# Patient Record
Sex: Male | Born: 1970 | Race: Black or African American | Hispanic: No | Marital: Single | Smoking: Former smoker
Health system: Southern US, Community
[De-identification: ages and names within clinical notes are randomized; demographics above are authoritative.]

## PROBLEM LIST (undated history)

## (undated) DIAGNOSIS — I10 Essential (primary) hypertension: Secondary | ICD-10-CM

## (undated) DIAGNOSIS — E119 Type 2 diabetes mellitus without complications: Secondary | ICD-10-CM

## (undated) DIAGNOSIS — I509 Heart failure, unspecified: Secondary | ICD-10-CM

## (undated) HISTORY — PX: FOOT SURGERY: SHX648

## (undated) HISTORY — DX: Essential (primary) hypertension: I10

---

## 2017-04-24 ENCOUNTER — Encounter (HOSPITAL_COMMUNITY): Payer: Self-pay

## 2017-04-24 ENCOUNTER — Emergency Department (HOSPITAL_COMMUNITY)
Admission: EM | Admit: 2017-04-24 | Discharge: 2017-04-24 | Disposition: A | Discharge status: Home / Self Care | Payer: Self-pay | Attending: Emergency Medicine | Admitting: Emergency Medicine

## 2017-04-24 DIAGNOSIS — E119 Type 2 diabetes mellitus without complications: Secondary | ICD-10-CM | POA: Insufficient documentation

## 2017-04-24 DIAGNOSIS — R739 Hyperglycemia, unspecified: Secondary | ICD-10-CM

## 2017-04-24 DIAGNOSIS — Z87891 Personal history of nicotine dependence: Secondary | ICD-10-CM | POA: Insufficient documentation

## 2017-04-24 HISTORY — DX: Type 2 diabetes mellitus without complications: E11.9

## 2017-04-24 LAB — CBG MONITORING, ED: Glucose-Capillary: 205 mg/dL — ABNORMAL HIGH (ref 65–99)

## 2017-04-24 NOTE — ED Provider Notes (Signed)
[facility] DEPT Provider Note   CSN: [id number] Arrival date & time: 04/24/17  1854     History   Chief Complaint Chief Complaint  Patient presents with  . Hyperglycemia    HPI Gary Bentley is a 46 y.o. male with history of insulin dependent Type 2 Diabetes who presents today for high blood glucose. The patient notes he last took his insulin at 3am this morning but is out of his medication now. He has a script for it but hasn't picked it up. His blood sugar was 205 on arrival. Not sure last A1c. Denies fever, chills, dizziness, unexpected weight loss, visual changes, syncope, chest pain, palpatations, sob, doe, abdominal pain, n/v/d, hematochezia, melena, leg swelling. The patient notes bleeding on his sock from the bottom of his foot 2 days ago. None today. History of surgery on foot from prior infection per patient. No numbness, tingling, weakness, decreased rom.   HPI  Past Medical History:  Diagnosis Date  . Diabetes mellitus without complication ([place])     There are no active problems to display for this patient.   Past Surgical History:  Procedure Laterality Date  . FOOT SURGERY         Home Medications    Prior to Admission medications   Not on File    Family History History reviewed. No pertinent family history.  Social History Social History  Substance Use Topics  . Smoking status: Former Smoker    Packs/day: 1.00    Types: Cigarettes    Quit date: 04/17/2017  . Smokeless tobacco: Never Used  . Alcohol use No     Allergies   Patient has no known allergies.   Review of Systems Review of Systems  All other systems reviewed and are negative.    Physical Exam Updated Vital Signs BP (!) 142/98 (BP Location: Right Arm)   Pulse 87   Temp 97.9 F (36.6 C) (Oral)   Resp 18   Ht 6\' 5"  (1.956 m)   Wt 108.9 kg (240 lb)   SpO2 99%   BMI 28.46 kg/m   Physical Exam  Constitutional: He appears well-developed and well-nourished.  HENT:    Head: Normocephalic and atraumatic.  Mouth/Throat: Oropharynx is clear and moist.  Eyes: Pupils are equal, round, and reactive to light. Right conjunctiva is injected. Left conjunctiva is injected.  Neck: Neck supple.  Cardiovascular: Normal rate, regular rhythm and intact distal pulses.   No murmur heard. Pulmonary/Chest: Effort normal and breath sounds normal. He exhibits no tenderness.  Abdominal: Soft. Bowel sounds are normal. There is no tenderness. There is no rebound and no guarding.  Musculoskeletal: He exhibits no edema.       Right ankle: He exhibits normal range of motion. No tenderness. Achilles tendon normal.       Left ankle: Normal. He exhibits normal range of motion. No tenderness. Achilles tendon normal.       Right foot: There is normal range of motion, no tenderness, no bony tenderness, no swelling and normal capillary refill.       Left foot: There is normal range of motion, no tenderness, no bony tenderness, no swelling and normal capillary refill.       Feet:  Proprioception to big toe intact. Onchomycosis present on big toe greatest on left big toe.   Lymphadenopathy:    He has no cervical adenopathy.  Neurological: He is alert. He has normal strength. No sensory deficit.  Skin: No rash noted. He is not diaphoretic.  Psychiatric: He has a normal mood and affect.  Nursing note and vitals reviewed.    ED Treatments / Results  Labs (all labs ordered are listed, but only abnormal results are displayed) Labs Reviewed  CBG MONITORING, ED - Abnormal; Notable for the following:       Result Value   Glucose-Capillary 205 (*)    All other components within normal limits    EKG  EKG Interpretation None       Radiology No results found.  Procedures Procedures (including critical care time)  Medications Ordered in ED Medications - No data to display   Initial Impression / Assessment and Plan / ED Course  I have reviewed the triage vital signs and the  nursing notes.  Pertinent labs & imaging results that were available during my care of the patient were reviewed by me and considered in my medical decision making (see chart for details).     This is a 46 y.o. male with history of insulin dependent Type 2 Diabetes who presents today for a increased blood glucose of 205 after running out of insulin at home. Patient has prescription to refill in the morning.    The patient notes bleeding on his sock from the bottom of his foot 2 days ago. None today. Says wounds are chronic. No numbness, tingling, weakness, decreased rom. Afebrile. Exam shows no evidence of bleeding ulceration.   Referall made to podiatry. Patient in agreeance with plan. Return precautions given. Patient can return to the emergency department at anytime if symptoms worsen.    Final Clinical Impressions(s) / ED Diagnoses   Final diagnoses:  Hyperglycemia    New Prescriptions New Prescriptions   No medications on file     Lorelle Gibbs 04/24/17 2347    Varney Biles, MD 04/25/17 1759

## 2017-04-24 NOTE — ED Notes (Signed)
Pt given urinal.

## 2017-04-24 NOTE — Discharge Instructions (Signed)
Please refill your insulin tomorrow and take as prescribed. I have provided you with information to follow up with a podiatrist regarding your feet. Please schedule an appointment. You can return to the emergency department at any time for worsening symptoms.

## 2017-04-24 NOTE — ED Notes (Signed)
Denies fever, chills, nausea, vomiting, or diarrhea.

## 2017-04-24 NOTE — ED Triage Notes (Signed)
Pt reports "i feel like my blood sugar is high and I'm peeing alot" CBG 205 here. Pt also endorses left foot pain, pt had infection in left foot "months ago" and reports it began bleeding 2 days ago. Pt has a callus to left foot, no bleeding noted by this RN. VSS.

## 2017-09-03 ENCOUNTER — Inpatient Hospital Stay
Admission: EM | Admit: 2017-09-03 | Discharge: 2017-09-05 | DRG: 638 | Disposition: A | Discharge status: Home / Self Care | Payer: Self-pay | Attending: Internal Medicine | Admitting: Internal Medicine

## 2017-09-03 ENCOUNTER — Emergency Department: Payer: Self-pay

## 2017-09-03 DIAGNOSIS — F17200 Nicotine dependence, unspecified, uncomplicated: Secondary | ICD-10-CM | POA: Diagnosis present

## 2017-09-03 DIAGNOSIS — L97521 Non-pressure chronic ulcer of other part of left foot limited to breakdown of skin: Secondary | ICD-10-CM

## 2017-09-03 DIAGNOSIS — M79671 Pain in right foot: Secondary | ICD-10-CM

## 2017-09-03 DIAGNOSIS — E11621 Type 2 diabetes mellitus with foot ulcer: Secondary | ICD-10-CM | POA: Diagnosis present

## 2017-09-03 DIAGNOSIS — Z794 Long term (current) use of insulin: Secondary | ICD-10-CM

## 2017-09-03 DIAGNOSIS — E1169 Type 2 diabetes mellitus with other specified complication: Principal | ICD-10-CM | POA: Diagnosis present

## 2017-09-03 DIAGNOSIS — E08621 Diabetes mellitus due to underlying condition with foot ulcer: Secondary | ICD-10-CM

## 2017-09-03 DIAGNOSIS — R350 Frequency of micturition: Secondary | ICD-10-CM | POA: Diagnosis present

## 2017-09-03 DIAGNOSIS — T148XXA Other injury of unspecified body region, initial encounter: Secondary | ICD-10-CM

## 2017-09-03 DIAGNOSIS — Z91128 Patient's intentional underdosing of medication regimen for other reason: Secondary | ICD-10-CM

## 2017-09-03 DIAGNOSIS — M869 Osteomyelitis, unspecified: Secondary | ICD-10-CM | POA: Diagnosis present

## 2017-09-03 DIAGNOSIS — L97519 Non-pressure chronic ulcer of other part of right foot with unspecified severity: Secondary | ICD-10-CM | POA: Diagnosis present

## 2017-09-03 DIAGNOSIS — E1165 Type 2 diabetes mellitus with hyperglycemia: Secondary | ICD-10-CM | POA: Diagnosis present

## 2017-09-03 DIAGNOSIS — R739 Hyperglycemia, unspecified: Secondary | ICD-10-CM

## 2017-09-03 DIAGNOSIS — L97529 Non-pressure chronic ulcer of other part of left foot with unspecified severity: Secondary | ICD-10-CM | POA: Diagnosis present

## 2017-09-03 DIAGNOSIS — M79672 Pain in left foot: Secondary | ICD-10-CM

## 2017-09-03 DIAGNOSIS — E1140 Type 2 diabetes mellitus with diabetic neuropathy, unspecified: Secondary | ICD-10-CM | POA: Diagnosis present

## 2017-09-03 DIAGNOSIS — L089 Local infection of the skin and subcutaneous tissue, unspecified: Secondary | ICD-10-CM

## 2017-09-03 LAB — CBC
HCT: 42.4 % (ref 40.0–52.0)
Hemoglobin: 14.3 g/dL (ref 13.0–18.0)
MCH: 33.8 pg (ref 26.0–34.0)
MCHC: 33.7 g/dL (ref 32.0–36.0)
MCV: 100.2 fL — ABNORMAL HIGH (ref 80.0–100.0)
PLATELETS: 254 10*3/uL (ref 150–440)
RBC: 4.23 MIL/uL — ABNORMAL LOW (ref 4.40–5.90)
RDW: 12.5 % (ref 11.5–14.5)
WBC: 7.3 10*3/uL (ref 3.8–10.6)

## 2017-09-03 LAB — BASIC METABOLIC PANEL
Anion gap: 12 (ref 5–15)
BUN: 12 mg/dL (ref 6–20)
CALCIUM: 9.2 mg/dL (ref 8.9–10.3)
CO2: 22 mmol/L (ref 22–32)
Chloride: 95 mmol/L — ABNORMAL LOW (ref 101–111)
Creatinine, Ser: 1.1 mg/dL (ref 0.61–1.24)
GFR calc Af Amer: >60 mL/min (ref >60)
GLUCOSE: 699 mg/dL — AB (ref 65–99)
Potassium: 3.9 mmol/L (ref 3.5–5.1)
Sodium: 129 mmol/L — ABNORMAL LOW (ref 135–145)

## 2017-09-03 LAB — URINALYSIS, COMPLETE (UACMP) WITH MICROSCOPIC
BILIRUBIN URINE: NEGATIVE
Glucose, UA: >=500 mg/dL — AB
Hgb urine dipstick: NEGATIVE
Ketones, ur: NEGATIVE mg/dL
Leukocytes, UA: NEGATIVE
NITRITE: NEGATIVE
PH: 6 (ref 5.0–8.0)
Protein, ur: NEGATIVE mg/dL
SPECIFIC GRAVITY, URINE: 1.03 (ref 1.005–1.030)
Squamous Epithelial / LPF: NONE SEEN

## 2017-09-03 LAB — GLUCOSE, CAPILLARY
GLUCOSE-CAPILLARY: 439 mg/dL — AB (ref 65–99)
GLUCOSE-CAPILLARY: 452 mg/dL — AB (ref 65–99)
GLUCOSE-CAPILLARY: 555 mg/dL — AB (ref 65–99)
Glucose-Capillary: >600 mg/dL (ref 65–99)
Glucose-Capillary: 540 mg/dL (ref 65–99)

## 2017-09-03 MED ORDER — SODIUM CHLORIDE 0.9 % IV BOLUS (SEPSIS)
1000.0000 mL | Freq: Once | INTRAVENOUS | Status: AC
  Administered 2017-09-03: 1000 mL via INTRAVENOUS

## 2017-09-03 MED ORDER — INSULIN LISPRO 100 UNIT/ML ~~LOC~~ SOLN: SUBCUTANEOUS | 0 refills | Status: DC

## 2017-09-03 MED ORDER — METFORMIN HCL 500 MG PO TABS: 500.0000 mg | ORAL_TABLET | Freq: Two times a day (BID) | ORAL | 0 refills | Status: DC

## 2017-09-03 MED ORDER — CEFTRIAXONE SODIUM IN DEXTROSE 20 MG/ML IV SOLN
1.0000 g | Freq: Once | INTRAVENOUS | Status: AC
  Administered 2017-09-04: 1 g via INTRAVENOUS
  Filled 2017-09-03: qty 50

## 2017-09-03 MED ORDER — SULFAMETHOXAZOLE-TRIMETHOPRIM 800-160 MG PO TABS: 1.0000 | ORAL_TABLET | Freq: Two times a day (BID) | ORAL | 0 refills | Status: DC

## 2017-09-03 MED ORDER — INSULIN ASPART 100 UNIT/ML ~~LOC~~ SOLN
10.0000 [IU] | Freq: Once | SUBCUTANEOUS | Status: AC
  Administered 2017-09-04: 10 [IU] via INTRAVENOUS
  Filled 2017-09-03: qty 1

## 2017-09-03 NOTE — ED Notes (Signed)
Lab called with critical glucose of 699.

## 2017-09-03 NOTE — Discharge Instructions (Signed)
1. Restart your diabetes medicines: Metformin 500 mg twice daily Humalog 30 units twice daily 2. Take antibiotic as prescribed (Septra DS twice daily 7 days). 3. Return to the ER for worsening symptoms, persistent vomiting, difficulty breathing, fever or other concerns.

## 2017-09-03 NOTE — ED Triage Notes (Signed)
Pt states he just moved here from [place], hx DM. Hasn't checked CBG in a month. States hasn't had insulin in 1 month either. States increased thirst and urination. Pt is alert, oriented, ambulatory.

## 2017-09-03 NOTE — ED Provider Notes (Signed)
[facility] Emergency Department Provider Note   ____________________________________________   First MD Initiated Contact with Patient 09/03/17 2336     (approximate)  I have reviewed the triage vital signs and the nursing notes.   HISTORY  Chief Complaint Hyperglycemia    HPI Gary Bentley is a 46 y.o. male who presents to the ED from home with a chief complaint of increased thirst and urination. Patient is a diabetic who moved here from [place] approximately 1 month ago. Has been without his metformin and Humalog since that time and has not checked his blood sugars. States baseline blood sugars in the 140 range. Denies associated fever, chills, vision changes, chest pain, shortness of breath, abdominal pain, nausea, vomiting. Denies recent trauma. Nothing makes his symptoms better or worse.   Past Medical History:  Diagnosis Date  . Diabetes mellitus without complication ([place])     There are no active problems to display for this patient.   Past Surgical History:  Procedure Laterality Date  . FOOT SURGERY      Prior to Admission medications   Medication Sig Start Date End Date Taking? Authorizing Provider  Insulin Detemir (LEVEMIR) 100 UNIT/ML Pen Inject 40 Units into the skin at bedtime. 05/14/17   [provider]    Allergies Patient has no known allergies.  History reviewed. No pertinent family history.  Social History Social History  Substance Use Topics  . Smoking status: Former Smoker    Packs/day: 1.00    Types: Cigarettes    Quit date: 04/17/2017  . Smokeless tobacco: Never Used  . Alcohol use No    Review of Systems  Constitutional: positive for increased thirst. No fever/chills. Eyes: No visual changes. ENT: No sore throat. Cardiovascular: Denies chest pain. Respiratory: Denies shortness of breath. Gastrointestinal: No abdominal pain.  No nausea, no vomiting.  No diarrhea.  No constipation. Genitourinary:  positive for urinary frequency.Negative for dysuria. Musculoskeletal: Negative for back pain. Skin: Negative for rash. Neurological: Negative for headaches, focal weakness or numbness.   ____________________________________________   PHYSICAL EXAM:  VITAL SIGNS: ED Triage Vitals  Enc Vitals Group     BP 09/03/17 2033 123/80     Pulse Rate 09/03/17 2033 98     Resp 09/03/17 2033 18     Temp 09/03/17 2033 98.6 F (37 C)     Temp Source 09/03/17 2033 Oral     SpO2 09/03/17 2033 98 %     Weight 09/03/17 2034 230 lb (104.3 kg)     Height 09/03/17 2034 6\' 6"  (1.981 m)     Head Circumference --      Peak Flow --      Pain Score 09/03/17 2033 7     Pain Loc --      Pain Edu? --      Excl. in [place]? --     Constitutional: Alert and oriented. Well appearing and in no acute distress. Eyes: Conjunctivae are normal. PERRL. EOMI. Head: Atraumatic. Nose: No congestion/rhinnorhea. Mouth/Throat: Mucous membranes are moist.  Oropharynx non-erythematous. Neck: No stridor.  No carotid bruits. Cardiovascular: Normal rate, regular rhythm. Grossly normal heart sounds.  Good peripheral circulation. Respiratory: Normal respiratory effort.  No retractions. Lungs CTAB. Gastrointestinal: Soft and nontender. No distention. No abdominal bruits. No CVA tenderness. Musculoskeletal:  Right foot: Dried ulcers to ball of foot and lateral sole. Left foot: Weeping ulcer to ball of foot without associated warmth or erythema. Neurologic:  Normal speech and language. No gross focal  neurologic deficits are appreciated. No gait instability. Skin:  Skin is warm, dry and intact. No rash noted. Psychiatric: Mood and affect are normal. Speech and behavior are normal.  ____________________________________________   LABS (all labs ordered are listed, but only abnormal results are displayed)  Labs Reviewed  BASIC METABOLIC PANEL - Abnormal; Notable for the following:       Result Value   Sodium 129 (*)     Chloride 95 (*)    Glucose, Bld 699 (*)    All other components within normal limits  CBC - Abnormal; Notable for the following:    RBC 4.23 (*)    MCV 100.2 (*)    All other components within normal limits  URINALYSIS, COMPLETE (UACMP) WITH MICROSCOPIC - Abnormal; Notable for the following:    Color, Urine COLORLESS (*)    APPearance CLEAR (*)    Glucose, UA >=500 (*)    Bacteria, UA RARE (*)    All other components within normal limits  GLUCOSE, CAPILLARY - Abnormal; Notable for the following:    Glucose-Capillary >600 (*)    All other components within normal limits  GLUCOSE, CAPILLARY - Abnormal; Notable for the following:    Glucose-Capillary 555 (*)    All other components within normal limits  GLUCOSE, CAPILLARY - Abnormal; Notable for the following:    Glucose-Capillary 540 (*)    All other components within normal limits  GLUCOSE, CAPILLARY - Abnormal; Notable for the following:    Glucose-Capillary 452 (*)    All other components within normal limits  CBG MONITORING, ED   ____________________________________________  EKG  None ____________________________________________  RADIOLOGY  No results found.  ____________________________________________   PROCEDURES  Procedure(s) performed: None  Procedures  Critical Care performed: No  ____________________________________________   INITIAL IMPRESSION / ASSESSMENT AND PLAN / ED COURSE  As part of my medical decision making, I reviewed the following data within the [place] notes reviewed and incorporated, Labs reviewed, and Radiograph reviewed.   46 year old diabetic male who presents with increased thirst, urination and left foot ulcer. Differential diagnosis includes but is not limited to hyperglycemia, DKA, infection, osteomyelitis, cellulitis. After 2 L of IV fluids, blood sugar has decreased to 439. Will add insulin, x-ray foot to evaluate osteomyelitis, initiate antibiotic  and reassess.  Clinical Course as of Sep 05 355  Sat Sep 04, 2017  0120 Patient asleep in no acute distress. Updated him of the x-ray results concerning for osteomyelitis. Will add IV vancomycin. Discussed with hospitalist evaluate patient in the emergency department for admission.  [JS]    Clinical Course User Index [JS] Paulette Blanch, MD     ____________________________________________   FINAL CLINICAL IMPRESSION(S) / ED DIAGNOSES  Final diagnoses:  Hyperglycemia  Diabetic ulcer of toe of left foot associated with type 2 diabetes mellitus, limited to breakdown of skin ([place])      NEW MEDICATIONS STARTED DURING THIS VISIT:  New Prescriptions   No medications on file     Note:  This document was prepared using Dragon voice recognition software and may include unintentional dictation errors.    Paulette Blanch, MD 09/04/17 [phone]

## 2017-09-04 ENCOUNTER — Encounter: Payer: Self-pay | Admitting: Internal Medicine

## 2017-09-04 ENCOUNTER — Inpatient Hospital Stay: Payer: Self-pay

## 2017-09-04 DIAGNOSIS — M79672 Pain in left foot: Secondary | ICD-10-CM

## 2017-09-04 DIAGNOSIS — R739 Hyperglycemia, unspecified: Secondary | ICD-10-CM

## 2017-09-04 DIAGNOSIS — E08621 Diabetes mellitus due to underlying condition with foot ulcer: Secondary | ICD-10-CM

## 2017-09-04 DIAGNOSIS — M79671 Pain in right foot: Secondary | ICD-10-CM

## 2017-09-04 DIAGNOSIS — M869 Osteomyelitis, unspecified: Secondary | ICD-10-CM

## 2017-09-04 LAB — CREATININE, SERUM: CREATININE: 0.77 mg/dL (ref 0.61–1.24)

## 2017-09-04 LAB — GLUCOSE, CAPILLARY
GLUCOSE-CAPILLARY: 335 mg/dL — AB (ref 65–99)
GLUCOSE-CAPILLARY: 299 mg/dL — AB (ref 65–99)
GLUCOSE-CAPILLARY: 254 mg/dL — AB (ref 65–99)
Glucose-Capillary: 406 mg/dL — ABNORMAL HIGH (ref 65–99)
Glucose-Capillary: 410 mg/dL — ABNORMAL HIGH (ref 65–99)
Glucose-Capillary: 283 mg/dL — ABNORMAL HIGH (ref 65–99)

## 2017-09-04 LAB — CBC
HCT: 41.7 % (ref 40.0–52.0)
Hemoglobin: 14 g/dL (ref 13.0–18.0)
MCH: 33.6 pg (ref 26.0–34.0)
MCHC: 33.6 g/dL (ref 32.0–36.0)
MCV: 99.9 fL (ref 80.0–100.0)
Platelets: 241 10*3/uL (ref 150–440)
RBC: 4.17 MIL/uL — ABNORMAL LOW (ref 4.40–5.90)
RDW: 12.7 % (ref 11.5–14.5)
WBC: 6 10*3/uL (ref 3.8–10.6)

## 2017-09-04 LAB — HEMOGLOBIN A1C
Hgb A1c MFr Bld: 15 % — ABNORMAL HIGH (ref 4.8–5.6)
Mean Plasma Glucose: 383.8 mg/dL

## 2017-09-04 MED ORDER — SODIUM CHLORIDE 0.9 % IV SOLN
INTRAVENOUS | Status: DC
  Administered 2017-09-04 – 2017-09-05 (×2): via INTRAVENOUS

## 2017-09-04 MED ORDER — ONDANSETRON HCL 4 MG/2ML IJ SOLN: 4.0000 mg | Freq: Four times a day (QID) | INTRAMUSCULAR | Status: DC | PRN

## 2017-09-04 MED ORDER — BISACODYL 10 MG RE SUPP
10.0000 mg | Freq: Every day | RECTAL | Status: DC | PRN
  Filled 2017-09-04: qty 1

## 2017-09-04 MED ORDER — PANTOPRAZOLE SODIUM 40 MG IV SOLR
40.0000 mg | Freq: Two times a day (BID) | INTRAVENOUS | Status: DC
  Administered 2017-09-04 – 2017-09-05 (×3): 40 mg via INTRAVENOUS
  Filled 2017-09-04 (×3): qty 40

## 2017-09-04 MED ORDER — INSULIN GLARGINE 100 UNIT/ML ~~LOC~~ SOLN
40.0000 [IU] | Freq: Every day | SUBCUTANEOUS | Status: DC
  Administered 2017-09-04: 40 [IU] via SUBCUTANEOUS
  Filled 2017-09-04 (×2): qty 0.4

## 2017-09-04 MED ORDER — ONDANSETRON HCL 4 MG PO TABS: 4.0000 mg | ORAL_TABLET | Freq: Four times a day (QID) | ORAL | Status: DC | PRN

## 2017-09-04 MED ORDER — ENOXAPARIN SODIUM 40 MG/0.4ML ~~LOC~~ SOLN
40.0000 mg | SUBCUTANEOUS | Status: DC
  Administered 2017-09-04: 40 mg via SUBCUTANEOUS
  Filled 2017-09-04: qty 0.4

## 2017-09-04 MED ORDER — ACETAMINOPHEN 650 MG RE SUPP: 650.0000 mg | Freq: Four times a day (QID) | RECTAL | Status: DC | PRN

## 2017-09-04 MED ORDER — ACETAMINOPHEN 325 MG PO TABS: 650.0000 mg | ORAL_TABLET | Freq: Four times a day (QID) | ORAL | Status: DC | PRN

## 2017-09-04 MED ORDER — INSULIN ASPART 100 UNIT/ML ~~LOC~~ SOLN
12.0000 [IU] | Freq: Once | SUBCUTANEOUS | Status: AC
  Administered 2017-09-04: 19:00:00 12 [IU] via SUBCUTANEOUS
  Filled 2017-09-04: qty 1

## 2017-09-04 MED ORDER — VANCOMYCIN HCL IN DEXTROSE 1-5 GM/200ML-% IV SOLN
1000.0000 mg | Freq: Once | INTRAVENOUS | Status: AC
  Administered 2017-09-04: 1000 mg via INTRAVENOUS
  Filled 2017-09-04: qty 200

## 2017-09-04 MED ORDER — SULFAMETHOXAZOLE-TRIMETHOPRIM 400-80 MG PO TABS
1.0000 | ORAL_TABLET | Freq: Two times a day (BID) | ORAL | Status: DC
  Administered 2017-09-04 (×2): 1 via ORAL
  Filled 2017-09-04 (×4): qty 1

## 2017-09-04 MED ORDER — INSULIN ASPART 100 UNIT/ML ~~LOC~~ SOLN
0.0000 [IU] | Freq: Three times a day (TID) | SUBCUTANEOUS | Status: DC
Start: 2017-09-04 — End: 2017-09-05

## 2017-09-04 MED ORDER — DOCUSATE SODIUM 100 MG PO CAPS
100.0000 mg | ORAL_CAPSULE | Freq: Two times a day (BID) | ORAL | Status: DC
  Administered 2017-09-04 – 2017-09-05 (×3): 100 mg via ORAL
  Filled 2017-09-04 (×3): qty 1

## 2017-09-04 MED ORDER — VANCOMYCIN HCL 10 G IV SOLR
1250.0000 mg | Freq: Three times a day (TID) | INTRAVENOUS | Status: DC
  Administered 2017-09-04 – 2017-09-05 (×3): 1250 mg via INTRAVENOUS
  Filled 2017-09-04 (×7): qty 1250

## 2017-09-04 MED ORDER — MORPHINE SULFATE (PF) 2 MG/ML IV SOLN: 2.0000 mg | INTRAVENOUS | Status: DC | PRN

## 2017-09-04 NOTE — ED Provider Notes (Signed)
-----------------------------------------   1:00 PM on 09/04/2017 -----------------------------------------  I personally seen and evaluated the patient. Patient has ulcerations bilateral feet. Patient's blood glucose has decreased to 299. Unfortunately the patient has had a proloned ER stay due to a miscommunication.  Patient remains pleasant, agreeable to admission for continued IV antibiotics for osteomyelitis. Patient is from [place], will be traveling back to [place] on Monday but states he is moving to this area permanently later this week. He has no primary care follow-up, he has no medications at home, states he has been out of his insulin for over one month. I believe admission to the hospital remains warranted for IV antibiotics for osteomyelitis continued glucose control and refill of home medications as well as getting plugged into the local resources. Patient is agreeable to this plan of care. The hospitalist will be admitting the patient at this time.    Harvest Dark, MD 09/04/17 1352

## 2017-09-04 NOTE — Progress Notes (Signed)
Pharmacy Antibiotic Note  Gary Bentley is a 46 y.o. male admitted on 09/03/2017 with osteomyelitis.  Pharmacy has been consulted for vancomycin dosing.  Plan: Vancomycin 1250mg  IV every 8 hours.  Goal trough 15-20 mcg/mL.  Height: 6\' 6"  (198.1 cm) Weight: 230 lb (104.3 kg) IBW/kg (Calculated) : 91.4  Temp (24hrs), Avg:98.4 F (36.9 C), Min:98.2 F (36.8 C), Max:98.6 F (37 C)   Recent Labs Lab 09/03/17 2036  WBC 7.3  CREATININE 1.10    Estimated Creatinine Clearance: 111.9 mL/min (by C-G formula based on SCr of 1.1 mg/dL).    No Known Allergies  Antimicrobials this admission: Anti-infectives    Start     Dose/Rate Route Frequency Ordered Stop   09/04/17 1500  vancomycin (VANCOCIN) 1,250 mg in sodium chloride 0.9 % 250 mL IVPB     1,250 mg 166.7 mL/hr over 90 Minutes Intravenous Every 8 hours 09/04/17 1407     09/04/17 1315  sulfamethoxazole-trimethoprim (BACTRIM,SEPTRA) 400-80 MG per tablet 1 tablet     1 tablet Oral Every 12 hours 09/04/17 1311     09/04/17 0130  vancomycin (VANCOCIN) IVPB 1000 mg/200 mL premix     1,000 mg 200 mL/hr over 60 Minutes Intravenous  Once 09/04/17 0120 09/04/17 0312   09/04/17 0000  cefTRIAXone (ROCEPHIN) 1 g in dextrose 5 % 50 mL IVPB - Premix     1 g 100 mL/hr over 30 Minutes Intravenous  Once 09/03/17 2345 09/04/17 0116   09/03/17 0000  sulfamethoxazole-trimethoprim (BACTRIM DS,SEPTRA DS) 800-160 MG tablet     1 tablet Oral 2 times daily 09/03/17 2348        Microbiology results: No results found for this or any previous visit (from the past 240 hour(s)).   Thank you for allowing pharmacy to be a part of this patient's care.  Donna Christen Gary Bentley 09/04/2017 2:08 PM

## 2017-09-04 NOTE — ED Notes (Addendum)
Pt. Reports not taking diabetic medication for the past month.  Pt. States sore on lt. Foot.  Pt. States sore has been on foot for "a while".  Pt. States it just started weeping this past week.

## 2017-09-04 NOTE — ED Notes (Signed)
Pt sleeping. vss wnl.

## 2017-09-04 NOTE — Progress Notes (Signed)
New admission. FSBS 405- 410 with MD notified with new orders obtained. Similar wounds on right foot with xray ordered. Pt states he has to leave tomorrow because he has to go back to [place] by Tuesday. Began teaching with pt stating he would not be staying and will be leaving tomorrow.

## 2017-09-04 NOTE — H&P (Signed)
History and Physical    Gary Bentley [id number] DOB: 11/02/1972 DOA: 09/03/2017  Referring physician: Dr. Beather Arbour PCP: Patient, No Pcp Per  Specialists: none  Chief Complaint: foot pain and increased thirst  HPI: Gary Bentley is a 46 y.o. male has a past medical history significant for DM and non-compliance who presents to ER with increased thirst and urinary frequency. Also with foot pain. Found to have blood sugars >400 and multiple foot ulcerations with x-rays revealing osteomyelitis. He is now admitted. No fever. Denies CP or SOB. No N/V/D.  Review of Systems: The patient denies anorexia, fever, weight loss,, vision loss, decreased hearing, hoarseness, chest pain, syncope, dyspnea on exertion, peripheral edema, balance deficits, hemoptysis, abdominal pain, melena, hematochezia, severe indigestion/heartburn, hematuria, incontinence, genital sores, muscle weakness, suspicious skin lesions, transient blindness, difficulty walking, depression, unusual weight change, abnormal bleeding, enlarged lymph nodes, angioedema, and breast masses.   Past Medical History:  Diagnosis Date  . Diabetes mellitus without complication [facility])    Past Surgical History:  Procedure Laterality Date  . FOOT SURGERY     Social History:  reports that he quit smoking about 4 months ago. His smoking use included Cigarettes. He smoked 1.00 pack per day. He has never used smokeless tobacco. He reports that he does not drink alcohol or use drugs.  No Known Allergies  History reviewed. No pertinent family history.  Prior to Admission medications   Medication Sig Start Date End Date Taking? Authorizing Provider  Insulin Detemir (LEVEMIR) 100 UNIT/ML Pen Inject 40 Units into the skin at bedtime. 05/14/17   [provider]  insulin lispro (HUMALOG) 100 UNIT/ML injection 30 units bid 09/03/17 09/03/18  Paulette Blanch, MD  metFORMIN (GLUCOPHAGE) 500 MG tablet Take 1 tablet (500 mg total) by mouth 2 (two)  times daily with a meal. 09/03/17   Paulette Blanch, MD  sulfamethoxazole-trimethoprim (BACTRIM DS,SEPTRA DS) 800-160 MG tablet Take 1 tablet by mouth 2 (two) times daily. 09/03/17   Paulette Blanch, MD   Physical Exam: Vitals:   09/04/17 0830 09/04/17 0930 09/04/17 1026 09/04/17 1151  BP: 126/80 135/76 118/72 134/79  Pulse: 60 66 66 (!) 58  Resp:    16  Temp:    98.2 F (36.8 C)  TempSrc:    Oral  SpO2: 93% 94% 97% 99%  Weight:      Height:         General:  No apparent distress, WDWN, [place]/AT  Eyes: PERRL, EOMI, no scleral icterus, conjunctiva clear  ENT: moist oropharynx without exudate, TM's benign, dentition fair  Neck: supple, no lymphadenopathy. No bruits or thyromegaly  Cardiovascular: regular rate without MRG; 2+ peripheral pulses, no JVD, no peripheral edema  Respiratory: CTA biL, good air movement without wheezing, rhonchi or crackled. Respiratory effort normal  Abdomen: soft, non tender to palpation, positive bowel sounds, no guarding, no rebound  Skin: no rashes. Multiple ulcerations noted to feet bilaterally  Musculoskeletal: normal bulk and tone, no joint swelling  Psychiatric: normal mood and affect, A&OX3  Neurologic: CN 2-12 grossly intact, Motor strength 5/5 in all 4 groups with symmetric DTR's. Stocking-glove neuropathy noted  Labs on Admission:  Basic Metabolic Panel:  Recent Labs Lab 09/03/17 2036  NA 129*  K 3.9  CL 95*  CO2 22  GLUCOSE 699*  BUN 12  CREATININE 1.10  CALCIUM 9.2   Liver Function Tests: No results for input(s): AST, ALT, ALKPHOS, BILITOT, PROT, ALBUMIN in the last 168 hours. No results for input(s):  LIPASE, AMYLASE in the last 168 hours. No results for input(s): AMMONIA in the last 168 hours. CBC:  Recent Labs Lab 09/03/17 2036  WBC 7.3  HGB 14.3  HCT 42.4  MCV 100.2*  PLT 254   Cardiac Enzymes: No results for input(s): CKTOTAL, CKMB, CKMBINDEX, TROPONINI in the last 168 hours.  BNP (last 3 results) No results  for input(s): BNP in the last 8760 hours.  ProBNP (last 3 results) No results for input(s): PROBNP in the last 8760 hours.  CBG:  Recent Labs Lab 09/03/17 2155 09/03/17 2325 09/03/17 2340 09/04/17 0724 09/04/17 1025  GLUCAP 540* 452* 439* 335* 299*    Radiological Exams on Admission: Dg Foot Complete Left  Result Date: 09/04/2017 CLINICAL DATA:  Soft tissue ulceration at the left great toe. Evaluate for osteomyelitis. Initial encounter. EXAM: LEFT FOOT - COMPLETE 3+ VIEW COMPARISON:  None. FINDINGS: There is no evidence of fracture or dislocation. An osseous erosion is noted at the distal aspect of the first distal phalanx, concerning for osteomyelitis. Overlying soft tissue swelling is noted. The joint spaces are preserved. There is no evidence of talar subluxation; the subtalar joint is unremarkable in appearance. No significant soft tissue abnormalities are seen. IMPRESSION: Osseous erosion at the distal aspect of the first distal phalanx, concerning for osteomyelitis. Electronically Signed   By: Garald Balding M.D.   On: 09/04/2017 00:26    EKG: Independently reviewed.  Assessment/Plan Principal Problem:   Osteomyelitis (HCC) Active Problems:   Diabetes mellitus due to underlying condition with foot ulcer (CODE) (HCC)   Hyperglycemia   Foot pain, bilateral   Will admit to floor with IV fluids and IV ABX and follow sugars. Resume Lantus QHS. Consult Podiatry. Add SSI. Consult CM and PT. Repeat labs in AM  Diet: low carb Fluids: NS@100  DVT Prophylaxis: Lovenox  Code Status: FULL  Family Communication: none  Disposition Plan: home  Time spent: 50 min

## 2017-09-04 NOTE — ED Notes (Signed)
Pt transported to room 129 

## 2017-09-05 LAB — GLUCOSE, CAPILLARY
GLUCOSE-CAPILLARY: 279 mg/dL — AB (ref 65–99)
GLUCOSE-CAPILLARY: 160 mg/dL — AB (ref 65–99)

## 2017-09-05 LAB — COMPREHENSIVE METABOLIC PANEL
ALT: 10 U/L — AB (ref 17–63)
AST: 13 U/L — AB (ref 15–41)
Albumin: 3 g/dL — ABNORMAL LOW (ref 3.5–5.0)
Alkaline Phosphatase: 98 U/L (ref 38–126)
Anion gap: 7 (ref 5–15)
BUN: 9 mg/dL (ref 6–20)
CHLORIDE: 106 mmol/L (ref 101–111)
CO2: 23 mmol/L (ref 22–32)
CREATININE: 0.72 mg/dL (ref 0.61–1.24)
Calcium: 8.7 mg/dL — ABNORMAL LOW (ref 8.9–10.3)
GFR calc non Af Amer: >60 mL/min (ref >60)
Glucose, Bld: 339 mg/dL — ABNORMAL HIGH (ref 65–99)
POTASSIUM: 3.7 mmol/L (ref 3.5–5.1)
SODIUM: 136 mmol/L (ref 135–145)
Total Bilirubin: 0.7 mg/dL (ref 0.3–1.2)
Total Protein: 6.2 g/dL — ABNORMAL LOW (ref 6.5–8.1)

## 2017-09-05 LAB — CBC
HEMATOCRIT: 37.9 % — AB (ref 40.0–52.0)
HEMOGLOBIN: 12.9 g/dL — AB (ref 13.0–18.0)
MCH: 34.2 pg — ABNORMAL HIGH (ref 26.0–34.0)
MCHC: 34.1 g/dL (ref 32.0–36.0)
MCV: 100.2 fL — AB (ref 80.0–100.0)
PLATELETS: 245 10*3/uL (ref 150–440)
RBC: 3.78 MIL/uL — AB (ref 4.40–5.90)
RDW: 12.6 % (ref 11.5–14.5)
WBC: 7.2 10*3/uL (ref 3.8–10.6)

## 2017-09-05 MED ORDER — AMOXICILLIN-POT CLAVULANATE 875-125 MG PO TABS: 1.0000 | ORAL_TABLET | Freq: Two times a day (BID) | ORAL | 0 refills | Status: DC

## 2017-09-05 MED ORDER — PIPERACILLIN-TAZOBACTAM 3.375 G IVPB
3.3750 g | Freq: Three times a day (TID) | INTRAVENOUS | Status: DC
  Administered 2017-09-05: 09:00:00 3.375 g via INTRAVENOUS
  Filled 2017-09-05: qty 50

## 2017-09-05 MED ORDER — METFORMIN HCL 500 MG PO TABS: 1000.0000 mg | ORAL_TABLET | Freq: Two times a day (BID) | ORAL | 0 refills | Status: DC

## 2017-09-05 MED ORDER — INSULIN ASPART 100 UNIT/ML ~~LOC~~ SOLN
0.0000 [IU] | Freq: Three times a day (TID) | SUBCUTANEOUS | Status: DC
  Administered 2017-09-05: 12:00:00 4 [IU] via SUBCUTANEOUS
  Administered 2017-09-05: 09:00:00 11 [IU] via SUBCUTANEOUS
  Filled 2017-09-05 (×2): qty 1

## 2017-09-05 MED ORDER — INSULIN ASPART 100 UNIT/ML ~~LOC~~ SOLN
3.0000 [IU] | Freq: Three times a day (TID) | SUBCUTANEOUS | Status: DC
  Administered 2017-09-05 (×2): 3 [IU] via SUBCUTANEOUS
  Filled 2017-09-05 (×2): qty 1

## 2017-09-05 MED ORDER — INSULIN GLARGINE 100 UNIT/ML ~~LOC~~ SOLN
42.0000 [IU] | Freq: Every day | SUBCUTANEOUS | Status: DC
  Filled 2017-09-05: qty 0.42

## 2017-09-05 MED ORDER — CIPROFLOXACIN HCL 500 MG PO TABS: 500.0000 mg | ORAL_TABLET | Freq: Two times a day (BID) | ORAL | 0 refills | Status: DC

## 2017-09-05 MED ORDER — INSULIN GLARGINE 100 UNIT/ML ~~LOC~~ SOLN: 42.0000 [IU] | Freq: Every day | SUBCUTANEOUS | 0 refills | Status: DC

## 2017-09-05 NOTE — Progress Notes (Signed)
Pt sleeping at shift change round. MD looking for pt. Pt not in room. RN found pt outside visitors entrance doors with IVF'S smoking a cigerette. [place] smoking policy education done with pt verbalizing understanding and escorted pt back to room. Pt was calm, pleasant and cooperative.  MD notified; pt declined nicotine patch.

## 2017-09-05 NOTE — Progress Notes (Signed)
[place] at [place] NAME: Gary Bentley    MR#:  [id number]  DATE OF BIRTH:  11/02/1972  SUBJECTIVE:   Patient has court date and needs to leave to drive to [place]:    Review of Systems  Constitutional: Negative for fever, chills weight loss HENT: Negative for ear pain, nosebleeds, congestion, facial swelling, rhinorrhea, neck pain, neck stiffness and ear discharge.   Respiratory: Negative for cough, shortness of breath, wheezing  Cardiovascular: Negative for chest pain, palpitations and leg swelling.  Gastrointestinal: Negative for heartburn, abdominal pain, vomiting, diarrhea or consitpation Genitourinary: Negative for dysuria, urgency, frequency, hematuria Musculoskeletal: Negative for back pain or joint pain Neurological: Negative for dizziness, seizures, syncope, focal weakness,  numbness and headaches.  Hematological: Does not bruise/bleed easily.  Psychiatric/Behavioral: Negative for hallucinations, confusion, dysphoric mood    Tolerating Diet: yes      DRUG ALLERGIES:  No Known Allergies  VITALS:  Blood pressure 129/70, pulse 61, temperature 98.1 F (36.7 C), temperature source Oral, resp. rate 19, height 6\' 6"  (1.981 m), weight 101.7 kg (224 lb 1.6 oz), SpO2 98 %.  PHYSICAL EXAMINATION:  Constitutional: Appears well-developed and well-nourished. No distress. HENT: Normocephalic. Marland Kitchen Oropharynx is clear and moist.  Eyes: Conjunctivae and EOM are normal. PERRLA, no scleral icterus.  Neck: Normal ROM. Neck supple. No JVD. No tracheal deviation. CVS: RRR, S1/S2 +, no murmurs, no gallops, no carotid bruit.  Pulmonary: Effort and breath sounds normal, no stridor, rhonchi, wheezes, rales.  Abdominal: Soft. BS +,  no distension, tenderness, rebound or guarding.  Musculoskeletal: Normal range of motion. No edema and no tenderness.  Neuro: Alert. CN 2-12 grossly intact. No focal deficits. Skin:  uilcerations on foot without purulent drainage. Psychiatric: Normal mood and affect.      LABORATORY PANEL:   CBC  Recent Labs Lab 09/05/17 0457  WBC 7.2  HGB 12.9*  HCT 37.9*  PLT 245   ------------------------------------------------------------------------------------------------------------------  Chemistries   Recent Labs Lab 09/05/17 0457  NA 136  K 3.7  CL 106  CO2 23  GLUCOSE 339*  BUN 9  CREATININE 0.72  CALCIUM 8.7*  AST 13*  ALT 10*  ALKPHOS 98  BILITOT 0.7   ------------------------------------------------------------------------------------------------------------------  Cardiac Enzymes No results for input(s): TROPONINI in the last 168 hours. ------------------------------------------------------------------------------------------------------------------  RADIOLOGY:  Dg Foot 2 Views Right  Result Date: 09/04/2017 CLINICAL DATA:  Diabetic ulcer at the base of the right foot. EXAM: RIGHT FOOT - 2 VIEW COMPARISON:  None. FINDINGS: There is no evidence of fracture or dislocation. There is no evidence of arthropathy or other focal bone abnormality. Lateral soft tissue swelling and small amount of soft tissue emphysema. IMPRESSION: No evidence of osseous changes to suggest osteomyelitis radiographically. Electronically Signed   By: Fidela Salisbury M.D.   On: 09/04/2017 19:36   Dg Foot Complete Left  Result Date: 09/04/2017 CLINICAL DATA:  Soft tissue ulceration at the left great toe. Evaluate for osteomyelitis. Initial encounter. EXAM: LEFT FOOT - COMPLETE 3+ VIEW COMPARISON:  None. FINDINGS: There is no evidence of fracture or dislocation. An osseous erosion is noted at the distal aspect of the first distal phalanx, concerning for osteomyelitis. Overlying soft tissue swelling is noted. The joint spaces are preserved. There is no evidence of talar subluxation; the subtalar joint is unremarkable in appearance. No significant soft tissue abnormalities  are seen. IMPRESSION: Osseous erosion at the distal aspect of the first distal phalanx, concerning for  osteomyelitis. Electronically Signed   By: Garald Balding M.D.   On: 09/04/2017 00:26     ASSESSMENT AND PLAN:   46 year old male with diabetes who presents with foot pain and found to have possible osteo on foot XRAY.  1. Osseous erosion at the distal aspect of the first distal phalanx, concerning for osteomyelitis, left foot:  Continue IV Zosyn and Podiatry Spoke with Dr Vickki Muff this am, since patient has court date [place] will plan on d/c with [facility] and [facility] for now and patient will follow up outpatient with Dr Vickki Muff or he may come back to ED.  2. Tobacco dependence: Patient is encouraged to quit smoking. Counseling was provided for 4 minutes.   3. Uncontrolled Diabetes: CM consulted for medication management.       Management plans discussed with the patient and he is in agreement.  CODE STATUS: full  TOTAL TIME TAKING CARE OF THIS PATIENT: 34 minutes.     POSSIBLE D/C today, DEPENDING ON CLINICAL CONDITION.   Shanine Kreiger M.D on 09/05/2017 at 8:52 AM  Between 7am to 6pm - Pager - [phone] After 6pm go to [email or web address] - password [email or web address] [facility]  Office  [phone]  CC: Primary care physician; Patient, No Pcp Per  Note: This dictation was prepared with Dragon dictation along with smaller phrase technology. Any transcriptional errors that result from this process are unintentional.

## 2017-09-05 NOTE — Progress Notes (Signed)
Dr. Vickki Muff in to see pt with foot care performed with dsgs applied-see MD note. Two post op cast shoes provided. Pt repeated back correct wound/dsg care/wearing of postop shoes/importance of diabetic complicance and followup of care. Pt was also noted second time returned off the unit to his room with reeducation done. Oral and written AVS instructions and 4 prescriptions given with stated understanding and has medication assistance coupon/information with pharmacy listings. Pt states he has to be in [place] for court date. DIscharged home to self care. Pt transported to ED to his vehicle at discharge. Driving care instructions also given with stated understanding.

## 2017-09-05 NOTE — Care Management Note (Addendum)
Case Management Note  Patient Details  Name: Gary Bentley MRN: [id number] Date of Birth: 11/02/1972  Subjective/Objective:     Provided [name] from [place] a [facility] coupon with participating pharmacies in [place].                Action/Plan:   Expected Discharge Date:  09/05/17               Expected Discharge Plan:     In-House Referral:     Discharge planning Services     Post Acute Care Choice:    Choice offered to:     DME Arranged:    DME Agency:     HH Arranged:    HH Agency:     Status of Service:     If discussed at [employer], dates discussed:    Additional Comments:  [name], RN 09/05/2017, 10:23 AM

## 2017-09-05 NOTE — Consult Note (Signed)
ORTHOPAEDIC CONSULTATION  REQUESTING PHYSICIAN: Bettey Costa, MD  Chief Complaint: diabetic foot ulcers with concern for osteomyelitis  HPI: Gary Bentley is a 46 y.o. male who complains of  Multiple foot ulcerations with increasing foot pain. Admitted infection as the patient had noticed drainage from his right foot ulcerative site.x-ray was concerning for osteomyelitis on the distal aspect of the left great toe. He states he had surgery on his right great toe in January in [place].  Past Medical History:  Diagnosis Date  . Diabetes mellitus without complication [facility])    Past Surgical History:  Procedure Laterality Date  . FOOT SURGERY     Social History   Social History  . Marital status: Single    Spouse name: N/A  . Number of children: N/A  . Years of education: N/A   Social History Main Topics  . Smoking status: Former Smoker    Packs/day: 1.00    Types: Cigarettes    Quit date: 04/17/2017  . Smokeless tobacco: Never Used  . Alcohol use No  . Drug use: No  . Sexual activity: Not Asked   Other Topics Concern  . None   Social History Narrative  . None   History reviewed. No pertinent family history. No Known Allergies Prior to Admission medications   Medication Sig Start Date End Date Taking? Authorizing Provider  Insulin Detemir (LEVEMIR) 100 UNIT/ML Pen Inject 40 Units into the skin at bedtime. 05/14/17   [provider]  insulin glargine (LANTUS) 100 UNIT/ML injection Inject 0.42 mLs (42 Units total) into the skin at bedtime. 09/05/17   Bettey Costa, MD  metFORMIN (GLUCOPHAGE) 500 MG tablet Take 2 tablets (1,000 mg total) by mouth 2 (two) times daily with a meal. 09/05/17   Bettey Costa, MD   Dg Foot 2 Views Right  Result Date: 09/04/2017 CLINICAL DATA:  Diabetic ulcer at the base of the right foot. EXAM: RIGHT FOOT - 2 VIEW COMPARISON:  None. FINDINGS: There is no evidence of fracture or dislocation. There is no evidence of arthropathy or other focal  bone abnormality. Lateral soft tissue swelling and small amount of soft tissue emphysema. IMPRESSION: No evidence of osseous changes to suggest osteomyelitis radiographically. Electronically Signed   By: Fidela Salisbury M.D.   On: 09/04/2017 19:36   Dg Foot Complete Left  Result Date: 09/04/2017 CLINICAL DATA:  Soft tissue ulceration at the left great toe. Evaluate for osteomyelitis. Initial encounter. EXAM: LEFT FOOT - COMPLETE 3+ VIEW COMPARISON:  None. FINDINGS: There is no evidence of fracture or dislocation. An osseous erosion is noted at the distal aspect of the first distal phalanx, concerning for osteomyelitis. Overlying soft tissue swelling is noted. The joint spaces are preserved. There is no evidence of talar subluxation; the subtalar joint is unremarkable in appearance. No significant soft tissue abnormalities are seen. IMPRESSION: Osseous erosion at the distal aspect of the first distal phalanx, concerning for osteomyelitis. Electronically Signed   By: Garald Balding M.D.   On: 09/04/2017 00:26   I personally evaluated both the left and right foot x-rays. No obvious signs of osteomyelitis. Concern on the distal aspect of the left great toe does not correlate clinically as there is no open ulceration on the very distal tip of the left great toe.  Positive ROS: All other systems have been reviewed and were otherwise negative with the exception of those mentioned in the HPI and as above.  12 point ROS was performed.  Physical Exam: General: Alert and oriented.  No apparent distress.  Vascular:  Left foot:Dorsalis Pedis:  present Posterior Tibial:  present  Right foot: Dorsalis Pedis:  present Posterior Tibial:  present  Neuro:absent protective sensation bilaterally  Derm:preoperative hyperkeratotic lesions under the fifth MTPJ's and interphalangeal joint of the great toes bilaterally. Upon debridement there was a very small superficial fifth MTPJ limited to break down of the  epidermis withoutextension to the subcutaneous tissue. No signs of infection.  Also under the left great toe interphalangeal joint was a very superficial small 5 mm ulceration limited to breakdown of the superficial epidermis without signs of infection.  Ortho/MS: good range of motion of the ankle subtalar metatarsal and metatarsophalangeal joints. No edema.   Assessment: Diabetic foot ulcers with neuropathy Noninfected  Plan: I was able to debride the superficial diabetic ulcerations with 15 blade down to good healthy bleeding tissue. The left and right foot ulcers were 5 mm in diameter proximally. This was l epidermis and dermal layer. Did not extend into the subcutaneous tissue. This was excisional debridement. Bandaging was applied. I discussed with the patient to apply an antibiotic ointment and gauze bandage daily to each wound. We will dispensed postoperative shoes. This is noninfected and does not require antibiotics at this time. He should be followed up in the outpatient clinic in the next 2-3 weeks. Patient states he is returning to [place] over the next few days but plans to return afterwards.    Elesa Hacker, DPM Cell [phone]   09/05/2017 9:26 AM

## 2017-09-05 NOTE — Discharge Summary (Signed)
[place] at [place] NAME: Gary Bentley    MR#:  [id number]  DATE OF BIRTH:  11/02/1972  DATE OF ADMISSION:  09/03/2017 ADMITTING PHYSICIAN: Idelle Crouch, MD  DATE OF DISCHARGE: 09/05/2017  PRIMARY CARE PHYSICIAN: Gary Bentley, No Pcp Per    ADMISSION DIAGNOSIS:  Hyperglycemia [R73.9] Diabetic ulcer of toe of left foot associated with type 2 diabetes mellitus, limited to breakdown of skin ([place]) [I69.629, L97.521] Osteomyelitis of left foot, unspecified type ([place]) [M86.9]  DISCHARGE DIAGNOSIS:  Principal Problem:   Active Problems:   Diabetes mellitus due to underlying condition with foot ulcer (CODE) ([place]) NOT OSTEO or INFECTED    Hyperglycemia    SECONDARY DIAGNOSIS:   Past Medical History:  Diagnosis Date  . Diabetes mellitus without complication [facility])     HOSPITAL COURSE:  46 year old male with diabetes who presents with foot pain and found to have possible osteo on foot XRAY.  1. Osseous erosion at the distal aspect of the first distal phalanx, concerning for osteomyelitis, left foot: Gary Bentley was started on  IV Zosyn and Vancomycin. He underwent bedside debridement. The ulcers did not extend into [place] tissue. He was seen by Dr Vickki Muff and it was felt that he actually did not have an active infection or Osteo. HE DOES NOT need antibiotics at the time of discharge. He should be followed up in the outpatient clinic in the next 2-3 weeks.  2. Tobacco dependence: Gary Bentley is encouraged to quit smoking. Counseling was provided for 4 minutes.   3. Uncontrolled Diabetes: CM consulted for medication management.   DISCHARGE CONDITIONS AND DIET:  Stable Diabetic diet  CONSULTS OBTAINED:    DRUG ALLERGIES:  No Known Allergies  DISCHARGE MEDICATIONS:   Current Discharge Medication List    START taking these medications   Details  insulin glargine (LANTUS) 100 UNIT/ML injection Inject 0.42 mLs (42 Units total) into the  skin at bedtime. Qty: 10 mL, Refills: 0      CONTINUE these medications which have CHANGED   Details  metFORMIN (GLUCOPHAGE) 500 MG tablet Take 2 tablets (1,000 mg total) by mouth 2 (two) times daily with a meal. Qty: 60 tablet, Refills: 0      STOP taking these medications     Insulin Detemir (LEVEMIR) 100 UNIT/ML Pen           Today   CHIEF COMPLAINT:  Gary Bentley has court date and needs to leave to drive [facility] he has been treated in past for foot infection No foot pain this am   VITAL SIGNS:  Blood pressure 129/70, pulse 61, temperature 98.1 F (36.7 C), temperature source Oral, resp. rate 19, height 6\' 6"  (1.981 m), weight 101.7 kg (224 lb 1.6 oz), SpO2 98 %.   REVIEW OF SYSTEMS:  Review of Systems  Constitutional: Negative.  Negative for chills, fever and malaise/fatigue.  HENT: Negative.  Negative for ear discharge, ear pain, hearing loss, nosebleeds and sore throat.   Eyes: Negative.  Negative for blurred vision and pain.  Respiratory: Negative.  Negative for cough, hemoptysis, shortness of breath and wheezing.   Cardiovascular: Negative.  Negative for chest pain, palpitations and leg swelling.  Gastrointestinal: Negative.  Negative for abdominal pain, blood in stool, diarrhea, nausea and vomiting.  Genitourinary: Negative.  Negative for dysuria.  Musculoskeletal: Negative.  Negative for back pain.  Skin:       Foot ulcerations  Neurological: Negative for dizziness, tremors, speech change, focal weakness, seizures  and headaches.  Endo/Heme/Allergies: Negative.  Does not bruise/bleed easily.  Psychiatric/Behavioral: Negative.  Negative for depression, hallucinations and suicidal ideas.     PHYSICAL EXAMINATION:  GENERAL:  46 y.o.-year-old Gary Bentley lying in the bed with no acute distress.  NECK:  Supple, no jugular venous distention. No thyroid enlargement, no tenderness.  LUNGS: Normal breath sounds bilaterally, no wheezing, rales,rhonchi  No use of  accessory muscles of respiration.  CARDIOVASCULAR: S1, S2 normal. No murmurs, rubs, or gallops.  ABDOMEN: Soft, non-tender, non-distended. Bowel sounds present. No organomegaly or mass.  EXTREMITIES: No pedal edema, cyanosis, or clubbing.  PSYCHIATRIC: The Gary Bentley is alert and oriented x 3.  SKIN: foot with ulcerations  DATA REVIEW:   CBC  Recent Labs Lab 09/05/17 0457  WBC 7.2  HGB 12.9*  HCT 37.9*  PLT 245    Chemistries   Recent Labs Lab 09/05/17 0457  NA 136  K 3.7  CL 106  CO2 23  GLUCOSE 339*  BUN 9  CREATININE 0.72  CALCIUM 8.7*  AST 13*  ALT 10*  ALKPHOS 98  BILITOT 0.7    Cardiac Enzymes No results for input(s): TROPONINI in the last 168 hours.  Microbiology Results  @[email or web address] @  RADIOLOGY:  Dg Foot 2 Views Right  Result Date: 09/04/2017 CLINICAL DATA:  Diabetic ulcer at the base of the right foot. EXAM: RIGHT FOOT - 2 VIEW COMPARISON:  None. FINDINGS: There is no evidence of fracture or dislocation. There is no evidence of arthropathy or other focal bone abnormality. Lateral soft tissue swelling and small amount of soft tissue emphysema. IMPRESSION: No evidence of osseous changes to suggest osteomyelitis radiographically. Electronically Signed   By: Fidela Salisbury M.D.   On: 09/04/2017 19:36   Dg Foot Complete Left  Result Date: 09/04/2017 CLINICAL DATA:  Soft tissue ulceration at the left great toe. Evaluate for osteomyelitis. Initial encounter. EXAM: LEFT FOOT - COMPLETE 3+ VIEW COMPARISON:  None. FINDINGS: There is no evidence of fracture or dislocation. An osseous erosion is noted at the distal aspect of the first distal phalanx, concerning for osteomyelitis. Overlying soft tissue swelling is noted. The joint spaces are preserved. There is no evidence of talar subluxation; the subtalar joint is unremarkable in appearance. No significant soft tissue abnormalities are seen. IMPRESSION: Osseous erosion at the distal aspect of the first distal  phalanx, concerning for osteomyelitis. Electronically Signed   By: Garald Balding M.D.   On: 09/04/2017 00:26      Current Discharge Medication List    START taking these medications   Details  insulin glargine (LANTUS) 100 UNIT/ML injection Inject 0.42 mLs (42 Units total) into the skin at bedtime. Qty: 10 mL, Refills: 0      CONTINUE these medications which have CHANGED   Details  metFORMIN (GLUCOPHAGE) 500 MG tablet Take 2 tablets (1,000 mg total) by mouth 2 (two) times daily with a meal. Qty: 60 tablet, Refills: 0      STOP taking these medications     Insulin Detemir (LEVEMIR) 100 UNIT/ML Pen           Management plans discussed with the Gary Bentley and he is in agreement. Stable for discharge   Gary Bentley should follow up with dr Vickki Muff or come back to ED after he returns from [place] for court   CODE STATUS:     Code Status Orders        Start     Ordered   09/04/17 1341  Full code  Continuous  09/04/17 1340    Code Status History    Date Active Date Inactive Code Status Order ID Comments User Context   This Gary Bentley has a current code status but no historical code status.      TOTAL TIME TAKING CARE OF THIS Gary Bentley: 38 minutes.    Note: This dictation was prepared with Dragon dictation along with smaller phrase technology. Any transcriptional errors that result from this process are unintentional.  Ajdin Macke M.D on 09/05/2017 at 9:39 AM  Between 7am to 6pm - Pager - [phone] After 6pm go to [email or web address] - password [email or web address] [facility]  Office  [phone]  CC: Primary care physician; Gary Bentley, No Pcp Per

## 2017-09-05 NOTE — Progress Notes (Signed)
Pharmacy Antibiotic Note  Gary Bentley is a 46 y.o. male admitted on 09/03/2017 with osteomyelitis.  Pharmacy has been consulted for vancomycin & zosyn dosing.  Plan: Vancomycin 1250mg  IV every 8 hours.  Goal trough 15-20 mcg/mL.   Zosyn 3.375gm IV Q8H  Height: 6\' 6"  (198.1 cm) Weight: 224 lb 1.6 oz (101.7 kg) IBW/kg (Calculated) : 91.4  Temp (24hrs), Avg:98.1 F (36.7 C), Min:97.7 F (36.5 C), Max:98.2 F (36.8 C)   Recent Labs Lab 09/03/17 2036 09/04/17 1341 09/05/17 0457  WBC 7.3 6.0 7.2  CREATININE 1.10 0.77 0.72    Estimated Creatinine Clearance: 153.9 mL/min (by C-G formula based on SCr of 0.72 mg/dL).    No Known Allergies  Antimicrobials this admission: Anti-infectives    Start     Dose/Rate Route Frequency Ordered Stop   09/05/17 0730  piperacillin-tazobactam (ZOSYN) IVPB 3.375 g     3.375 g 12.5 mL/hr over 240 Minutes Intravenous Every 8 hours 09/05/17 0720     09/04/17 1500  vancomycin (VANCOCIN) 1,250 mg in sodium chloride 0.9 % 250 mL IVPB     1,250 mg 166.7 mL/hr over 90 Minutes Intravenous Every 8 hours 09/04/17 1407     09/04/17 1315  sulfamethoxazole-trimethoprim (BACTRIM,SEPTRA) 400-80 MG per tablet 1 tablet  Status:  Discontinued     1 tablet Oral Every 12 hours 09/04/17 1311 09/05/17 0713   09/04/17 0130  vancomycin (VANCOCIN) IVPB 1000 mg/200 mL premix     1,000 mg 200 mL/hr over 60 Minutes Intravenous  Once 09/04/17 0120 09/04/17 0312   09/04/17 0000  cefTRIAXone (ROCEPHIN) 1 g in dextrose 5 % 50 mL IVPB - Premix     1 g 100 mL/hr over 30 Minutes Intravenous  Once 09/03/17 2345 09/04/17 0116   09/03/17 0000  sulfamethoxazole-trimethoprim (BACTRIM DS,SEPTRA DS) 800-160 MG tablet     1 tablet Oral 2 times daily 09/03/17 2348        Microbiology results: No results found for this or any previous visit (from the past 240 hour(s)).   Thank you for allowing pharmacy to be a part of this patient's care.  Tabatha Razzano C 09/05/2017 7:20  AM

## 2017-09-06 LAB — HIV ANTIBODY (ROUTINE TESTING W REFLEX): HIV Screen 4th Generation wRfx: NONREACTIVE

## 2017-10-13 ENCOUNTER — Emergency Department
Admission: EM | Admit: 2017-10-13 | Discharge: 2017-10-13 | Disposition: A | Discharge status: Home / Self Care | Payer: Self-pay | Attending: Emergency Medicine | Admitting: Emergency Medicine

## 2017-10-13 ENCOUNTER — Encounter: Payer: Self-pay | Admitting: Emergency Medicine

## 2017-10-13 ENCOUNTER — Emergency Department: Payer: Self-pay

## 2017-10-13 DIAGNOSIS — L97521 Non-pressure chronic ulcer of other part of left foot limited to breakdown of skin: Secondary | ICD-10-CM | POA: Insufficient documentation

## 2017-10-13 DIAGNOSIS — Z9114 Patient's other noncompliance with medication regimen: Secondary | ICD-10-CM | POA: Insufficient documentation

## 2017-10-13 DIAGNOSIS — E1165 Type 2 diabetes mellitus with hyperglycemia: Secondary | ICD-10-CM | POA: Insufficient documentation

## 2017-10-13 DIAGNOSIS — E11621 Type 2 diabetes mellitus with foot ulcer: Secondary | ICD-10-CM | POA: Insufficient documentation

## 2017-10-13 DIAGNOSIS — Z794 Long term (current) use of insulin: Secondary | ICD-10-CM | POA: Insufficient documentation

## 2017-10-13 DIAGNOSIS — Z87891 Personal history of nicotine dependence: Secondary | ICD-10-CM | POA: Insufficient documentation

## 2017-10-13 DIAGNOSIS — R739 Hyperglycemia, unspecified: Secondary | ICD-10-CM

## 2017-10-13 DIAGNOSIS — E08621 Diabetes mellitus due to underlying condition with foot ulcer: Secondary | ICD-10-CM

## 2017-10-13 LAB — CBC
HCT: 39.6 % — ABNORMAL LOW (ref 40.0–52.0)
HEMOGLOBIN: 13.3 g/dL (ref 13.0–18.0)
MCH: 34.1 pg — ABNORMAL HIGH (ref 26.0–34.0)
MCHC: 33.5 g/dL (ref 32.0–36.0)
MCV: 101.8 fL — ABNORMAL HIGH (ref 80.0–100.0)
PLATELETS: 256 10*3/uL (ref 150–440)
RBC: 3.89 MIL/uL — AB (ref 4.40–5.90)
RDW: 13 % (ref 11.5–14.5)
WBC: 9.8 10*3/uL (ref 3.8–10.6)

## 2017-10-13 LAB — BASIC METABOLIC PANEL
ANION GAP: 9 (ref 5–15)
BUN: 6 mg/dL (ref 6–20)
CHLORIDE: 99 mmol/L — AB (ref 101–111)
CO2: 26 mmol/L (ref 22–32)
Calcium: 8.7 mg/dL — ABNORMAL LOW (ref 8.9–10.3)
Creatinine, Ser: 0.76 mg/dL (ref 0.61–1.24)
GFR calc Af Amer: >60 mL/min (ref >60)
GLUCOSE: 455 mg/dL — AB (ref 65–99)
POTASSIUM: 3.5 mmol/L (ref 3.5–5.1)
Sodium: 134 mmol/L — ABNORMAL LOW (ref 135–145)

## 2017-10-13 LAB — URINALYSIS, COMPLETE (UACMP) WITH MICROSCOPIC
BILIRUBIN URINE: NEGATIVE
Bacteria, UA: NONE SEEN
HGB URINE DIPSTICK: NEGATIVE
Ketones, ur: NEGATIVE mg/dL
Leukocytes, UA: NEGATIVE
NITRITE: NEGATIVE
Protein, ur: NEGATIVE mg/dL
SPECIFIC GRAVITY, URINE: 1.032 — AB (ref 1.005–1.030)
Squamous Epithelial / LPF: NONE SEEN
pH: 7 (ref 5.0–8.0)

## 2017-10-13 LAB — GLUCOSE, CAPILLARY
GLUCOSE-CAPILLARY: 410 mg/dL — AB (ref 65–99)
Glucose-Capillary: 473 mg/dL — ABNORMAL HIGH (ref 65–99)

## 2017-10-13 MED ORDER — INSULIN GLARGINE 100 UNIT/ML ~~LOC~~ SOLN: SUBCUTANEOUS | 0 refills | Status: DC

## 2017-10-13 MED ORDER — INSULIN ASPART 100 UNIT/ML ~~LOC~~ SOLN
SUBCUTANEOUS | Status: AC
  Administered 2017-10-13: 10 [IU] via SUBCUTANEOUS
  Filled 2017-10-13: qty 1

## 2017-10-13 MED ORDER — METFORMIN HCL 500 MG PO TABS: 1000.0000 mg | ORAL_TABLET | Freq: Two times a day (BID) | ORAL | 0 refills | Status: DC

## 2017-10-13 MED ORDER — INSULIN ASPART 100 UNIT/ML ~~LOC~~ SOLN
10.0000 [IU] | Freq: Once | SUBCUTANEOUS | Status: AC
  Administered 2017-10-13: 10 [IU] via SUBCUTANEOUS

## 2017-10-13 MED ORDER — SODIUM CHLORIDE 0.9 % IV BOLUS (SEPSIS)
1000.0000 mL | Freq: Once | INTRAVENOUS | Status: AC
  Administered 2017-10-13: 1000 mL via INTRAVENOUS

## 2017-10-13 NOTE — Discharge Instructions (Signed)
Please begin taking your insulin as prescribed twice a day and make an appointment to establish care with a primary care physician in 2 days for a recheck.  Return to the emergency department sooner for any concerns whatsoever.  It was a pleasure to take care of you today, and thank you for coming to our emergency department.  If you have any questions or concerns before leaving please ask the nurse to grab me and I'm more than happy to go through your aftercare instructions again.  If you were prescribed any opioid pain medication today such as Norco, Vicodin, Percocet, morphine, hydrocodone, or oxycodone please make sure you do not drive when you are taking this medication as it can alter your ability to drive safely.  If you have any concerns once you are home that you are not improving or are in fact getting worse before you can make it to your follow-up appointment, please do not hesitate to call 911 and come back for further evaluation.  Darel Hong, MD  Results for orders placed or performed during the hospital encounter of 47/42/59  Basic metabolic panel  Result Value Ref Range   Sodium 134 (L) 135 - 145 mmol/L   Potassium 3.5 3.5 - 5.1 mmol/L   Chloride 99 (L) 101 - 111 mmol/L   CO2 26 22 - 32 mmol/L   Glucose, Bld 455 (H) 65 - 99 mg/dL   BUN 6 6 - 20 mg/dL   Creatinine, Ser 0.76 0.61 - 1.24 mg/dL   Calcium 8.7 (L) 8.9 - 10.3 mg/dL   GFR calc non Af Amer >60 >60 mL/min   GFR calc Af Amer >60 >60 mL/min   Anion gap 9 5 - 15  CBC  Result Value Ref Range   WBC 9.8 3.8 - 10.6 K/uL   RBC 3.89 (L) 4.40 - 5.90 MIL/uL   Hemoglobin 13.3 13.0 - 18.0 g/dL   HCT 39.6 (L) 40.0 - 52.0 %   MCV 101.8 (H) 80.0 - 100.0 fL   MCH 34.1 (H) 26.0 - 34.0 pg   MCHC 33.5 32.0 - 36.0 g/dL   RDW 13.0 11.5 - 14.5 %   Platelets 256 150 - 440 K/uL  Urinalysis, Complete w Microscopic  Result Value Ref Range   Color, Urine STRAW (A) YELLOW   APPearance CLEAR (A) CLEAR   Specific Gravity, Urine  1.032 (H) 1.005 - 1.030   pH 7.0 5.0 - 8.0   Glucose, UA >=500 (A) NEGATIVE mg/dL   Hgb urine dipstick NEGATIVE NEGATIVE   Bilirubin Urine NEGATIVE NEGATIVE   Ketones, ur NEGATIVE NEGATIVE mg/dL   Protein, ur NEGATIVE NEGATIVE mg/dL   Nitrite NEGATIVE NEGATIVE   Leukocytes, UA NEGATIVE NEGATIVE   RBC / HPF 0-5 0 - 5 RBC/hpf   WBC, UA 0-5 0 - 5 WBC/hpf   Bacteria, UA NONE SEEN NONE SEEN   Squamous Epithelial / LPF NONE SEEN NONE SEEN  Glucose, capillary  Result Value Ref Range   Glucose-Capillary 410 (H) 65 - 99 mg/dL   Dg Foot Complete Left  Result Date: 10/13/2017 CLINICAL DATA:  Foot pain in the left great toe. Evaluate for infection. EXAM: LEFT FOOT - COMPLETE 3+ VIEW COMPARISON:  09/04/2017 FINDINGS: No visible soft tissue or osseous erosion. No joint destruction. Osteopenia. No fracture or subluxation. No soft tissue gas or opaque foreign body. IMPRESSION: Negative.  No soft tissue gas or bony erosion. Electronically Signed   By: Monte Fantasia M.D.   On: 10/13/2017 20:57

## 2017-10-13 NOTE — ED Triage Notes (Signed)
Pt comes into the ED via POV c/o left foot pain and hyperglycemia.  Patient had a foot infection and they started him on antibiotics including ciprofloxacin and Augmentin.  Patient now is still having ongoing pain and now his sugar has spiked at home.  Patient in NAD at this time and is ambulatory to triage.  Denies any fevers at home but states he has had increased weakness since this started.  Patient is diabetic.

## 2017-10-13 NOTE — ED Provider Notes (Signed)
[facility] Emergency Department Provider Note  ____________________________________________   First MD Initiated Contact with Patient 10/13/17 2035     (approximate)  I have reviewed the triage vital signs and the nursing notes.   HISTORY  Chief Complaint Foot Pain and Hyperglycemia   HPI Gary Bentley is a 46 y.o. male who comes to the emergency department requesting a refill of his insulin.  He normally takes Lantus 10 mg twice a day for "2-3 weeks".  He also reports ulcers to bilateral feet.  He has intermittently taken antibiotics for these ulcers but states that he is not currently.  He normally takes metformin but he is out of these as well.  He denies fevers or chills.  He is concerned because he checked his blood sugar at home today and it was elevated.  His symptoms began gradually and has been constant.  They seem to be worsened when not having insulin and improved when he does.  Past Medical History:  Diagnosis Date  . Diabetes mellitus without complication [facility])     Patient Active Problem List   Diagnosis Date Noted  . Osteomyelitis ([place]) 09/04/2017  . 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 Hyperglycemia 09/04/2017  . Foot pain, bilateral 09/04/2017    Past Surgical History:  Procedure Laterality Date  . FOOT SURGERY      Prior to Admission medications   Medication Sig Start Date End Date Taking? Authorizing Provider  insulin glargine (LANTUS) 100 UNIT/ML injection Inject 0.42 mLs (42 Units total) into the skin at bedtime. 09/05/17   Bettey Costa, MD  insulin glargine (LANTUS) 100 UNIT/ML injection Take 10 units SQ BID 10/13/17 10/13/18  Darel Hong, MD  metFORMIN (GLUCOPHAGE) 500 MG tablet Take 2 tablets (1,000 mg total) by mouth 2 (two) times daily with a meal. 10/13/17   Darel Hong, MD    Allergies Patient has no known allergies.  No family history on file.  Social  History Social History   Tobacco Use  . Smoking status: Former Smoker    Packs/day: 1.00    Types: Cigarettes    Last attempt to quit: 04/17/2017    Years since quitting: 0.4  . Smokeless tobacco: Never Used  Substance Use Topics  . Alcohol use: No  . Drug use: No    Review of Systems Constitutional: No fever/chills Eyes: No visual changes. ENT: No sore throat. Cardiovascular: Denies chest pain. Respiratory: Denies shortness of breath. Gastrointestinal: No abdominal pain.  No nausea, no vomiting.  No diarrhea.  No constipation. Genitourinary: Negative for dysuria. Musculoskeletal: Negative for back pain. Skin: Negative for rash. Neurological: Negative for headaches, focal weakness or numbness.   ____________________________________________   PHYSICAL EXAM:  VITAL SIGNS: ED Triage Vitals [10/13/17 2007]  Enc Vitals Group     BP (!) 172/89     Pulse Rate 85     Resp 17     Temp 97.9 F (36.6 C)     Temp Source Oral     SpO2 97 %     Weight 233 lb (105.7 kg)     Height 6\' 6"  (1.981 m)     Head Circumference      Peak Flow      Pain Score 8     Pain Loc      Pain Edu?      Excl. in [place]?     Constitutional: Alert and oriented x4 well-appearing nontoxic no diaphoresis speaks  full clear sentences Eyes: PERRL EOMI. Head: Atraumatic. Nose: No congestion/rhinnorhea. Mouth/Throat: No trismus Neck: No stridor.   Cardiovascular: Normal rate, regular rhythm. Grossly normal heart sounds.  Good peripheral circulation. Respiratory: Normal respiratory effort.  No retractions. Lungs CTAB and moving good air Gastrointestinal: Soft nontender Musculoskeletal: Bilateral feet  No tenderness over medial malleolus or lateral malleolus or for 6 cm proximal No tenderness over navicular, midfoot, or fifth metatarsal 2+ dorsalis pedis pulse Patient has noninfected ulcerations to the plantar surface ball of both feet Compartments soft Patient can fire extensor hallucis longus,  extensor digitorum longus, flexor hallucis longus, flexor digitorum longus, tibialis anterior, and gastrocnemius Sensation intact to light touch to sural, saphenous, deep peroneal, superficial peroneal, and tibial nerve    Neurologic:  Normal speech and language. No gross focal neurologic deficits are appreciated. Skin:  Skin is warm, dry and intact. No rash noted. Psychiatric: Mood and affect are normal. Speech and behavior are normal.    ____________________________________________   DIFFERENTIAL includes but not limited to  Diabetic ulcer, gangrene, osteomyelitis ____________________________________________   LABS (all labs ordered are listed, but only abnormal results are displayed)  Labs Reviewed  BASIC METABOLIC PANEL - Abnormal; Notable for the following components:      Result Value   Sodium 134 (*)    Chloride 99 (*)    Glucose, Bld 455 (*)    Calcium 8.7 (*)    All other components within normal limits  CBC - Abnormal; Notable for the following components:   RBC 3.89 (*)    HCT 39.6 (*)    MCV 101.8 (*)    MCH 34.1 (*)    All other components within normal limits  URINALYSIS, COMPLETE (UACMP) WITH MICROSCOPIC - Abnormal; Notable for the following components:   Color, Urine STRAW (*)    APPearance CLEAR (*)    Specific Gravity, Urine 1.032 (*)    Glucose, UA >=500 (*)    All other components within normal limits  GLUCOSE, CAPILLARY - Abnormal; Notable for the following components:   Glucose-Capillary 410 (*)    All other components within normal limits  CBG MONITORING, ED    Blood work reviewed by me shows elevated blood glucose but normal anion gap and no evidence of diabetic ketoacidosis __________________________________________  EKG   ____________________________________________  RADIOLOGY  X-ray of the foot reviewed by me with no evidence of acute osteomyelitis ____________________________________________   PROCEDURES  Procedure(s) performed:  no  Procedures  Critical Care performed: no  Observation: no ____________________________________________   INITIAL IMPRESSION / ASSESSMENT AND PLAN / ED COURSE  Pertinent labs & imaging results that were available during my care of the patient were reviewed by me and considered in my medical decision making (see chart for details).  The patient arrives hyperglycemic although hemodynamically stable very well-appearing.  He has been noncompliant with his medications secondary to not having a primary care physician.  After liter of fluid he feels improved.  His wounds are clearly not infected he has strong pulses in both feet.  I will refill his insulin and metformin and help him establish care with primary care.  He is discharged home in improved condition verbalized understanding and agree with plan.      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 Use AVSMEDLIST instead to display the medication list for a patient.   Note:  This document was prepared using Dragon voice recognition software and may include unintentional dictation errors.     Darel Hong, MD 10/13/17 2159

## 2017-10-13 NOTE — ED Notes (Signed)
X-ray at bedside

## 2017-10-13 NOTE — ED Notes (Addendum)
Pt states he is diabetic, states insulin and metformin. Has diabetic ulcers noted to bilat feet- both big toes and R foot near pinky toe. Denies drainage. States diabetic neuropathy. Swelling noted to R foot/ankle. States has been on antibiotics x few weeks and has seen improvement in ulcers. States CBG's at home have been in 400's. States increase in thirst and urination recently. No distress noted. Alert and oriented x 4.

## 2018-03-22 ENCOUNTER — Emergency Department: Payer: Self-pay

## 2018-03-22 ENCOUNTER — Encounter: Payer: Self-pay | Admitting: Emergency Medicine

## 2018-03-22 ENCOUNTER — Other Ambulatory Visit: Payer: Self-pay

## 2018-03-22 ENCOUNTER — Inpatient Hospital Stay
Admission: EM | Admit: 2018-03-22 | Discharge: 2018-03-24 | DRG: 264 | Disposition: A | Discharge status: Home / Self Care | Payer: Self-pay | Attending: Family Medicine | Admitting: Family Medicine

## 2018-03-22 DIAGNOSIS — Z7982 Long term (current) use of aspirin: Secondary | ICD-10-CM

## 2018-03-22 DIAGNOSIS — L97529 Non-pressure chronic ulcer of other part of left foot with unspecified severity: Secondary | ICD-10-CM | POA: Diagnosis present

## 2018-03-22 DIAGNOSIS — E1140 Type 2 diabetes mellitus with diabetic neuropathy, unspecified: Secondary | ICD-10-CM | POA: Diagnosis present

## 2018-03-22 DIAGNOSIS — E1152 Type 2 diabetes mellitus with diabetic peripheral angiopathy with gangrene: Principal | ICD-10-CM | POA: Diagnosis present

## 2018-03-22 DIAGNOSIS — L03116 Cellulitis of left lower limb: Secondary | ICD-10-CM | POA: Diagnosis present

## 2018-03-22 DIAGNOSIS — E1165 Type 2 diabetes mellitus with hyperglycemia: Secondary | ICD-10-CM | POA: Diagnosis present

## 2018-03-22 DIAGNOSIS — E11622 Type 2 diabetes mellitus with other skin ulcer: Secondary | ICD-10-CM | POA: Diagnosis present

## 2018-03-22 DIAGNOSIS — I96 Gangrene, not elsewhere classified: Secondary | ICD-10-CM

## 2018-03-22 DIAGNOSIS — E119 Type 2 diabetes mellitus without complications: Secondary | ICD-10-CM

## 2018-03-22 DIAGNOSIS — Z599 Problem related to housing and economic circumstances, unspecified: Secondary | ICD-10-CM

## 2018-03-22 DIAGNOSIS — I10 Essential (primary) hypertension: Secondary | ICD-10-CM | POA: Diagnosis present

## 2018-03-22 DIAGNOSIS — T148XXA Other injury of unspecified body region, initial encounter: Secondary | ICD-10-CM

## 2018-03-22 DIAGNOSIS — M79605 Pain in left leg: Secondary | ICD-10-CM

## 2018-03-22 DIAGNOSIS — E1169 Type 2 diabetes mellitus with other specified complication: Secondary | ICD-10-CM | POA: Diagnosis present

## 2018-03-22 DIAGNOSIS — E11628 Type 2 diabetes mellitus with other skin complications: Secondary | ICD-10-CM | POA: Diagnosis present

## 2018-03-22 DIAGNOSIS — Z9114 Patient's other noncompliance with medication regimen: Secondary | ICD-10-CM

## 2018-03-22 DIAGNOSIS — I998 Other disorder of circulatory system: Secondary | ICD-10-CM

## 2018-03-22 DIAGNOSIS — E11621 Type 2 diabetes mellitus with foot ulcer: Secondary | ICD-10-CM | POA: Diagnosis present

## 2018-03-22 DIAGNOSIS — L089 Local infection of the skin and subcutaneous tissue, unspecified: Secondary | ICD-10-CM

## 2018-03-22 DIAGNOSIS — M86172 Other acute osteomyelitis, left ankle and foot: Secondary | ICD-10-CM | POA: Diagnosis present

## 2018-03-22 DIAGNOSIS — Z794 Long term (current) use of insulin: Secondary | ICD-10-CM

## 2018-03-22 DIAGNOSIS — L84 Corns and callosities: Secondary | ICD-10-CM | POA: Diagnosis present

## 2018-03-22 DIAGNOSIS — Z5320 Procedure and treatment not carried out because of patient's decision for unspecified reasons: Secondary | ICD-10-CM | POA: Diagnosis present

## 2018-03-22 DIAGNOSIS — L0390 Cellulitis, unspecified: Secondary | ICD-10-CM | POA: Diagnosis present

## 2018-03-22 DIAGNOSIS — F17200 Nicotine dependence, unspecified, uncomplicated: Secondary | ICD-10-CM | POA: Diagnosis present

## 2018-03-22 LAB — HEMOGLOBIN A1C
HEMOGLOBIN A1C: 12.4 % — AB (ref 4.8–5.6)
Mean Plasma Glucose: 309.18 mg/dL

## 2018-03-22 LAB — CBC WITH DIFFERENTIAL/PLATELET
BASOS ABS: 0.1 10*3/uL (ref 0–0.1)
BASOS PCT: 1 %
EOS ABS: 0.2 10*3/uL (ref 0–0.7)
Eosinophils Relative: 2 %
HCT: 37.4 % — ABNORMAL LOW (ref 40.0–52.0)
HEMOGLOBIN: 12.4 g/dL — AB (ref 13.0–18.0)
LYMPHS ABS: 1.2 10*3/uL (ref 1.0–3.6)
Lymphocytes Relative: 16 %
MCH: 33.4 pg (ref 26.0–34.0)
MCHC: 33.2 g/dL (ref 32.0–36.0)
MCV: 100.7 fL — ABNORMAL HIGH (ref 80.0–100.0)
Monocytes Absolute: 1.1 10*3/uL — ABNORMAL HIGH (ref 0.2–1.0)
Monocytes Relative: 14 %
NEUTROS PCT: 67 %
Neutro Abs: 5.3 10*3/uL (ref 1.4–6.5)
Platelets: 275 10*3/uL (ref 150–440)
RBC: 3.71 MIL/uL — AB (ref 4.40–5.90)
RDW: 13.4 % (ref 11.5–14.5)
WBC: 7.9 10*3/uL (ref 3.8–10.6)

## 2018-03-22 LAB — COMPREHENSIVE METABOLIC PANEL
ALBUMIN: 3.2 g/dL — AB (ref 3.5–5.0)
ALK PHOS: 91 U/L (ref 38–126)
ALT: 10 U/L — AB (ref 17–63)
AST: 15 U/L (ref 15–41)
Anion gap: 7 (ref 5–15)
BUN: 10 mg/dL (ref 6–20)
CALCIUM: 8.5 mg/dL — AB (ref 8.9–10.3)
CO2: 27 mmol/L (ref 22–32)
CREATININE: 0.9 mg/dL (ref 0.61–1.24)
Chloride: 100 mmol/L — ABNORMAL LOW (ref 101–111)
GFR calc Af Amer: >60 mL/min (ref >60)
GFR calc non Af Amer: >60 mL/min (ref >60)
Glucose, Bld: 429 mg/dL — ABNORMAL HIGH (ref 65–99)
Potassium: 4.4 mmol/L (ref 3.5–5.1)
SODIUM: 134 mmol/L — AB (ref 135–145)
Total Bilirubin: 0.7 mg/dL (ref 0.3–1.2)
Total Protein: 7.1 g/dL (ref 6.5–8.1)

## 2018-03-22 LAB — LACTIC ACID, PLASMA
Lactic Acid, Venous: 0.9 mmol/L (ref 0.5–1.9)
Lactic Acid, Venous: 0.8 mmol/L (ref 0.5–1.9)

## 2018-03-22 LAB — PROTIME-INR
INR: 0.97
Prothrombin Time: 12.8 seconds (ref 11.4–15.2)

## 2018-03-22 LAB — GLUCOSE, CAPILLARY
GLUCOSE-CAPILLARY: 277 mg/dL — AB (ref 65–99)
GLUCOSE-CAPILLARY: 274 mg/dL — AB (ref 65–99)

## 2018-03-22 MED ORDER — ACETAMINOPHEN 325 MG PO TABS
650.0000 mg | ORAL_TABLET | Freq: Four times a day (QID) | ORAL | Status: DC | PRN
  Administered 2018-03-22 – 2018-03-23 (×2): 650 mg via ORAL
  Filled 2018-03-22 (×2): qty 2

## 2018-03-22 MED ORDER — PIPERACILLIN-TAZOBACTAM 3.375 G IVPB 30 MIN
3.3750 g | Freq: Once | INTRAVENOUS | Status: AC
  Administered 2018-03-22: 3.375 g via INTRAVENOUS
  Filled 2018-03-22: qty 50

## 2018-03-22 MED ORDER — ASPIRIN EC 81 MG PO TBEC
81.0000 mg | DELAYED_RELEASE_TABLET | Freq: Every day | ORAL | Status: DC
  Administered 2018-03-22 – 2018-03-24 (×3): 81 mg via ORAL
  Filled 2018-03-22 (×4): qty 1

## 2018-03-22 MED ORDER — VANCOMYCIN HCL 10 G IV SOLR
1250.0000 mg | Freq: Three times a day (TID) | INTRAVENOUS | Status: DC
Start: 2018-03-22 — End: 2018-03-24
  Administered 2018-03-22 – 2018-03-24 (×6): 1250 mg via INTRAVENOUS
  Filled 2018-03-22 (×9): qty 1250

## 2018-03-22 MED ORDER — INSULIN ASPART 100 UNIT/ML ~~LOC~~ SOLN
0.0000 [IU] | Freq: Every day | SUBCUTANEOUS | Status: DC
  Administered 2018-03-22: 2 [IU] via SUBCUTANEOUS
  Filled 2018-03-22: qty 1

## 2018-03-22 MED ORDER — PIPERACILLIN-TAZOBACTAM 3.375 G IVPB
3.3750 g | Freq: Three times a day (TID) | INTRAVENOUS | Status: DC
  Administered 2018-03-22 – 2018-03-24 (×7): 3.375 g via INTRAVENOUS
  Filled 2018-03-22 (×6): qty 50

## 2018-03-22 MED ORDER — DOCUSATE SODIUM 100 MG PO CAPS
100.0000 mg | ORAL_CAPSULE | Freq: Two times a day (BID) | ORAL | Status: DC
  Administered 2018-03-22 (×2): 100 mg via ORAL
  Filled 2018-03-22 (×4): qty 1

## 2018-03-22 MED ORDER — INSULIN DETEMIR 100 UNIT/ML ~~LOC~~ SOLN
25.0000 [IU] | Freq: Two times a day (BID) | SUBCUTANEOUS | Status: DC
Start: 2018-03-22 — End: 2018-03-23
  Administered 2018-03-22 – 2018-03-23 (×3): 25 [IU] via SUBCUTANEOUS
  Filled 2018-03-22 (×4): qty 0.25

## 2018-03-22 MED ORDER — ONDANSETRON HCL 4 MG PO TABS: 4.0000 mg | ORAL_TABLET | Freq: Four times a day (QID) | ORAL | Status: DC | PRN

## 2018-03-22 MED ORDER — ONDANSETRON HCL 4 MG/2ML IJ SOLN: 4.0000 mg | Freq: Four times a day (QID) | INTRAMUSCULAR | Status: DC | PRN

## 2018-03-22 MED ORDER — POLYETHYLENE GLYCOL 3350 17 G PO PACK
17.0000 g | PACK | Freq: Every day | ORAL | Status: DC | PRN
  Administered 2018-03-22: 17 g via ORAL
  Filled 2018-03-22: qty 1

## 2018-03-22 MED ORDER — INSULIN ASPART 100 UNIT/ML ~~LOC~~ SOLN
0.0000 [IU] | Freq: Three times a day (TID) | SUBCUTANEOUS | Status: DC
  Administered 2018-03-22: 5 [IU] via SUBCUTANEOUS
  Administered 2018-03-23 – 2018-03-24 (×3): 2 [IU] via SUBCUTANEOUS
  Administered 2018-03-24: 1 [IU] via SUBCUTANEOUS
  Administered 2018-03-24: 2 [IU] via SUBCUTANEOUS
  Filled 2018-03-22 (×4): qty 1

## 2018-03-22 MED ORDER — SODIUM CHLORIDE 0.9 % IV SOLN
INTRAVENOUS | Status: AC
  Administered 2018-03-22: 14:00:00 via INTRAVENOUS

## 2018-03-22 MED ORDER — ACETAMINOPHEN 650 MG RE SUPP: 650.0000 mg | Freq: Four times a day (QID) | RECTAL | Status: DC | PRN

## 2018-03-22 MED ORDER — VANCOMYCIN HCL IN DEXTROSE 1-5 GM/200ML-% IV SOLN
1000.0000 mg | Freq: Once | INTRAVENOUS | Status: AC
  Administered 2018-03-22: 1000 mg via INTRAVENOUS
  Filled 2018-03-22: qty 200

## 2018-03-22 MED ORDER — SODIUM CHLORIDE 0.9 % IV SOLN
1000.0000 mL | Freq: Once | INTRAVENOUS | Status: AC
  Administered 2018-03-22: 1000 mL via INTRAVENOUS

## 2018-03-22 MED ORDER — ENOXAPARIN SODIUM 40 MG/0.4ML ~~LOC~~ SOLN
40.0000 mg | SUBCUTANEOUS | Status: DC
  Administered 2018-03-22 – 2018-03-23 (×2): 40 mg via SUBCUTANEOUS
  Filled 2018-03-22 (×2): qty 0.4

## 2018-03-22 NOTE — Plan of Care (Signed)
  Problem: Clinical Measurements: Goal: Ability to maintain clinical measurements within normal limits will improve Outcome: Progressing   Problem: Activity: Goal: Risk for activity intolerance will decrease Outcome: Progressing   Problem: Safety: Goal: Ability to remain free from injury will improve Outcome: Progressing   Problem: Skin Integrity: Goal: Risk for impaired skin integrity will decrease Outcome: Progressing

## 2018-03-22 NOTE — Consult Note (Signed)
[facility] Vascular Consult Note  MRN : [id number]  Gary Bentley is a 47 y.o. (11/02/1972) male who presents with chief complaint of  Chief Complaint  Patient presents with  . Foot Swelling  .  History of Present Illness:  I have asked to evaluate the patient by Dr. Tressia Miners for peripheral vascular disease in conjunction with diabetic foot infection and ulceration.  The patient is being evaluated for painful left lower extremity and diminished pulses associated with ulceration of the feet.  Left lower extremity is much more severely affected than the right.  The patient notes the ulcer has been present for multiple weeks and has not been improving.  It is very painful and has had some drainage.  No specific history of trauma noted by the patient.  The patient denies fever or chills.  the patient does have diabetes which has been difficult to control.  The patient denies rest pain or dangling of an extremity off the side of the bed during the night for relief. No prior interventions or surgeries.  No history of back problems or DJD of the lumbar sacral spine.   The patient denies amaurosis fugax or recent TIA symptoms. There are no recent neurological changes noted. The patient denies history of DVT, PE or superficial thrombophlebitis. The patient denies recent episodes of angina or shortness of breath.    Current Facility-Administered Medications  Medication Dose Route Frequency Provider Last Rate Last Dose  . 0.9 %  sodium chloride infusion   Intravenous Continuous Gladstone Lighter, MD 75 mL/hr at 03/22/18 1344    . acetaminophen (TYLENOL) tablet 650 mg  650 mg Oral Q6H PRN Gladstone Lighter, MD   650 mg at 03/22/18 1639   Or  . acetaminophen (TYLENOL) suppository 650 mg  650 mg Rectal Q6H PRN Gladstone Lighter, MD      . aspirin EC tablet 81 mg  81 mg Oral Daily Gladstone Lighter, MD   81 mg at 03/22/18 1340  . docusate sodium (COLACE) capsule  100 mg  100 mg Oral BID Gladstone Lighter, MD   100 mg at 03/22/18 1340  . enoxaparin (LOVENOX) injection 40 mg  40 mg Subcutaneous Q24H Gladstone Lighter, MD      . insulin aspart (novoLOG) injection 0-5 Units  0-5 Units Subcutaneous QHS Gladstone Lighter, MD      . insulin aspart (novoLOG) injection 0-9 Units  0-9 Units Subcutaneous TID WC Gladstone Lighter, MD   5 Units at 03/22/18 1811  . insulin detemir (LEVEMIR) injection 25 Units  25 Units Subcutaneous BID Gladstone Lighter, MD   25 Units at 03/22/18 1516  . ondansetron (ZOFRAN) tablet 4 mg  4 mg Oral Q6H PRN Gladstone Lighter, MD       Or  . ondansetron (ZOFRAN) injection 4 mg  4 mg Intravenous Q6H PRN Gladstone Lighter, MD      . piperacillin-tazobactam (ZOSYN) IVPB 3.375 g  3.375 g Intravenous Q8H Hallaji, Sheema M, RPH 12.5 mL/hr at 03/22/18 1516 3.375 g at 03/22/18 1516  . polyethylene glycol (MIRALAX / GLYCOLAX) packet 17 g  17 g Oral Daily PRN Gladstone Lighter, MD   17 g at 03/22/18 1811  . vancomycin (VANCOCIN) 1,250 mg in sodium chloride 0.9 % 250 mL IVPB  1,250 mg Intravenous Q8H Hallaji, Sheema M, RPH 166.7 mL/hr at 03/22/18 1812 1,250 mg at 03/22/18 1812    Past Medical History:  Diagnosis Date  . Diabetes mellitus without complication ([facility])     Past  Surgical History:  Procedure Laterality Date  . FOOT SURGERY      Social History Social History   Tobacco Use  . Smoking status: Current Every Day Smoker    Packs/day: 1.00    Types: Cigarettes    Last attempt to quit: 04/17/2017    Years since quitting: 0.9  . Smokeless tobacco: Never Used  Substance Use Topics  . Alcohol use: No  . Drug use: No    Family History Family History  Family history unknown: Yes  No family history of bleeding/clotting disorders, porphyria or autoimmune disease   No Known Allergies   REVIEW OF SYSTEMS (Negative unless checked)  Constitutional: [] Weight loss  [] Fever  [] Chills Cardiac: [] Chest pain   [] Chest pressure    [] Palpitations   [] Shortness of breath when laying flat   [] Shortness of breath at rest   [] Shortness of breath with exertion. Vascular:  [x] Pain in legs with walking   [x] Pain in legs at rest   [] Pain in legs when laying flat   [] Claudication   [] Pain in feet when walking  [] Pain in feet at rest  [] Pain in feet when laying flat   [] History of DVT   [] Phlebitis   [x] Swelling in legs   [] Varicose veins   [x] Non-healing ulcers Pulmonary:   [] Uses home oxygen   [] Productive cough   [] Hemoptysis   [] Wheeze  [] COPD   [] Asthma Neurologic:  [] Dizziness  [] Blackouts   [] Seizures   [] History of stroke   [] History of TIA  [] Aphasia   [] Temporary blindness   [] Dysphagia   [] Weakness or numbness in arms   [] Weakness or numbness in legs Musculoskeletal:  [] Arthritis   [] Joint swelling   [x] Joint pain   [] Low back pain Hematologic:  [] Easy bruising  [] Easy bleeding   [] Hypercoagulable state   [] Anemic  [] Hepatitis Gastrointestinal:  [] Blood in stool   [] Vomiting blood  [] Gastroesophageal reflux/heartburn   [] Difficulty swallowing. Genitourinary:  [] Chronic kidney disease   [] Difficult urination  [] Frequent urination  [] Burning with urination   [] Blood in urine Skin:  [] Rashes   [x] Ulcers   [x] Wounds Psychological:  [] History of anxiety   []  History of major depression.  Physical Examination  Vitals:   03/22/18 0856 03/22/18 1102 03/22/18 1327 03/22/18 1714  BP:  132/89 (!) 149/85 139/90  Pulse:  64 (!) 55 60  Resp:  18    Temp:  98.2 F (36.8 C) 98.6 F (37 C) 98.6 F (37 C)  TempSrc:  Oral Oral Oral  SpO2:  100% 99% 100%  Weight: 215 lb (97.5 kg)     Height: 6\' 6"  (1.981 m)      Body mass index is 24.85 kg/m. Gen:  WD/WN, NAD Head: [place]/AT, No temporalis wasting. Prominent temp pulse not noted. Ear/Nose/Throat: Hearing grossly intact, nares w/o erythema or drainage, oropharynx w/o Erythema/Exudate Eyes: Sclera non-icteric, conjunctiva clear Neck: Trachea midline.  No JVD.  Pulmonary:  Good air  movement, respirations not labored, equal bilaterally.  Cardiac: RRR, normal S1, S2. Vascular: Left lower extremity demonstrates shiny redness of the skin which is tense and warm to the touch.  There is 3+ edema.  There are several thickened calluses noted on the plantar surface also laterally on the left foot which are cracked 1 of which shows some drainage.  The right foot demonstrates significant callus formation again some of which are cracked consistent with ulceration the most notable is on the plantar surface and medial surface of the right great toe. Vessel Right Left  Radial Palpable Palpable  PT  1+ palpable  not palpable  DP  not palpable  1+ palpable   Gastrointestinal: soft, non-tender/non-distended. No guarding/reflex.  Musculoskeletal: M/S 5/5 throughout.  Extremities without ischemic changes.  No deformity or atrophy. No edema. Neurologic: Sensation grossly intact in extremities.  Symmetrical.  Speech is fluent. Motor exam as listed above. Psychiatric: Judgment intact, Mood & affect appropriate for pt's clinical situation. Dermatologic: + ulcers noted.  + cellulitis with open wounds left foot and ankle. Lymph : No Cervical, Axillary, or Inguinal lymphadenopathy.      CBC Lab Results  Component Value Date   WBC 7.9 03/22/2018   HGB 12.4 (L) 03/22/2018   HCT 37.4 (L) 03/22/2018   MCV 100.7 (H) 03/22/2018   PLT 275 03/22/2018    BMET    Component Value Date/Time   NA 134 (L) 03/22/2018 0901   K 4.4 03/22/2018 0901   CL 100 (L) 03/22/2018 0901   CO2 27 03/22/2018 0901   GLUCOSE 429 (H) 03/22/2018 0901   BUN 10 03/22/2018 0901   CREATININE 0.90 03/22/2018 0901   CALCIUM 8.5 (L) 03/22/2018 0901   GFRNONAA >60 03/22/2018 0901   GFRAA >60 03/22/2018 0901   Estimated Creatinine Clearance: 135.4 mL/min (by C-G formula based on SCr of 0.9 mg/dL).  COAG Lab Results  Component Value Date   INR 0.97 03/22/2018    Radiology US Venous Img Lower Unilateral  Left  Result Date: 03/22/2018 CLINICAL DATA:  Swelling and redness x1 week EXAM: LEFT LOWER EXTREMITY VENOUS DOPPLER ULTRASOUND TECHNIQUE: Gray-scale sonography with compression, as well as color and duplex ultrasound, were performed to evaluate the deep venous system from the level of the common femoral vein through the popliteal and proximal calf veins. COMPARISON:  None FINDINGS: Normal compressibility of the common femoral, superficial femoral, and popliteal veins, as well as the proximal calf veins. No filling defects to suggest DVT on grayscale or color Doppler imaging. Doppler waveforms show normal direction of venous flow, normal respiratory phasicity and response to augmentation. Survey views of the contralateral common femoral vein are unremarkable. IMPRESSION: No evidence of left lower extremity deep vein thrombosis. Electronically Signed   By: Lucrezia Europe M.D.   On: 03/22/2018 09:43   Dg Foot Complete Left  Result Date: 03/22/2018 CLINICAL DATA:  Diabetic ulcer.  Gangrenous great toe. EXAM: LEFT FOOT - COMPLETE 3+ VIEW COMPARISON:  12/13/2016. FINDINGS: Soft tissue swelling and ulceration left great toe. No clear-cut bony erosive change noted. No evidence of fracture or dislocation. Soft tissue swelling is also noted focally of the lateral aspect of the foot as well. IMPRESSION: Soft tissue swelling and ulceration left great toe. No clearcut underlying bony erosion or focal acute bony abnormality. 2. Soft tissue focal swelling noted over the lateral aspect of the foot as well. Electronically Signed   By: Marcello Moores  Register   On: 03/22/2018 11:05      Assessment/Plan 1.  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  At the present time he should have adequate blood flow for treatment of his infection and he has been initiated  on appropriate antibiotics.  It would be my hope that the swelling and inflammatory changes would continue to subside at which point left lower extremity angiography could be performed.  This would confirm adequate perfusion of the forefoot via the dorsalis pedis on the  left and may also allow for recruitment of the posterior tibial back in 2 circulation. 2.  Cellulitis left lower extremity: Patient has been initiated on appropriate antibiotic therapy and his infection will continue to be treated. 3.  Diabetes mellitus: Patient is poorly controlled his blood sugars on arrival or 400.  At the time of this dictation the A1c is still pending.  Continue hypoglycemic medications as ordered.   Hortencia Pilar, MD  03/22/2018 6:13 PM    This note was created with Dragon medical transcription system.  Any error is purely unintentional

## 2018-03-22 NOTE — Clinical Social Work Note (Addendum)
Clinical Social Work Assessment  Patient Details  Name: Gary Bentley MRN: [id number] Date of Birth: 11/02/1972  Date of referral:  03/22/18               Reason for consult:  Housing Concerns/Homelessness                Permission sought to share information with:    Permission granted to share information::     Name::        Agency::     Relationship::     Contact Information:     Housing/Transportation Living arrangements for the past 2 months:  Hotel/Motel Source of Information:  Patient Patient Interpreter Needed:  None Criminal Activity/Legal Involvement Pertinent to Current Situation/Hospitalization:  No - Comment as needed Significant Relationships:  None Lives with:  Self Do you feel safe going back to the place where you live?  Yes Need for family participation in patient care:  No (Coment)  Care giving concerns: Patient has been living at the [facility] in [place] for about 3 weeks.   Social Worker assessment / plan: Holiday representative ([place]) received housing concern consult. Social work Theatre manager met with patient alone at bedside. Patient was sitting up alert and oriented x4. Social work Theatre manager introduced self and explained the role of the Ness. Patient shared he is currently living at the [facility] and just moved here from [place], [place]. Patient shared he has no family or support in the area. 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 Patient denied using alcohol or any other drugs. Social work Theatre manager presented patient with an [employer], Physicist, medical. Patient accepted resource list and plans to return to the hotel until he can find permanent housing. CSW and social work Theatre manager will continue to follow up and assist.  Employment status:  [employer] information:  Self Pay (Medicaid Pending) PT Recommendations:  Not assessed at this time Information / Referral to community  resources:     Patient/Family's Response to care: Patient plans to return to [facility] until he can find permanent housing.  Patient/Family's Understanding of and Emotional Response to Diagnosis, Current Treatment, and Prognosis: Patient was pleasant and thanked social work Theatre manager for her assistance.  Emotional Assessment Appearance:  Appears stated age Attitude/Demeanor/Rapport:    Affect (typically observed):  Accepting, Calm, Pleasant Orientation:  Oriented to Self, Oriented to Place, Oriented to  Time, Oriented to Situation Alcohol / Substance use:  Not Applicable Psych involvement (Current and /or in the community):  No (Comment)  Discharge Needs  Concerns to be addressed:  Homelessness Readmission within the last 30 days:  No Current discharge risk:  Homeless Barriers to Discharge:  Continued Medical Work up   [employer], [place] Work 03/22/2018, 1:42 PM

## 2018-03-22 NOTE — ED Notes (Signed)
First Nurse Note:  Patient seated in lobby awaiting [place].

## 2018-03-22 NOTE — ED Notes (Signed)
Patient returns to ED Lobby from [place], alert and oriented.  No complaints verbalized.

## 2018-03-22 NOTE — ED Provider Notes (Signed)
[facility] Emergency Department Provider Note   ____________________________________________    I have reviewed the triage vital signs and the nursing notes.   HISTORY  Chief Complaint Foot Swelling     HPI Gary Bentley is a 47 y.o. male with a history of diabetes who presents with complaints of pain in his left great toe and distal foot.  He reports his pain has been worsening over the last several days since a "callus" fell off of an ulceration.  He reports he has not seen anyone regarding his foot and "quite a while ".  He has not taken anything for this.  Denies fevers.  Reports redness on the top of his left foot  Past Medical History:  Diagnosis Date  . Diabetes mellitus without complication [facility])     Patient Active Problem List   Diagnosis Date Noted  . Cellulitis 03/22/2018  . Osteomyelitis ([place]) 09/04/2017  . Diabetes mellitus due to underlying condition with foot ulcer (CODE) ([place]) 09/04/2017  . Hyperglycemia 09/04/2017  . Foot pain, bilateral 09/04/2017    Past Surgical History:  Procedure Laterality Date  . FOOT SURGERY      Prior to Admission medications   Medication Sig Start Date End Date Taking? Authorizing Provider  insulin detemir (LEVEMIR) 100 UNIT/ML injection Inject 30 Units into the skin 2 (two) times daily.   Yes [provider]  metFORMIN (GLUCOPHAGE) 500 MG tablet Take 2 tablets (1,000 mg total) by mouth 2 (two) times daily with a meal. 10/13/17  Yes Rifenbark, Milta Deiters, MD  insulin glargine (LANTUS) 100 UNIT/ML injection Inject 0.42 mLs (42 Units total) into the skin at bedtime. Patient not taking: Reported on 03/22/2018 09/05/17   Bettey Costa, MD  insulin glargine (LANTUS) 100 UNIT/ML injection Take 10 units SQ BID Patient not taking: Reported on 03/22/2018 10/13/17 10/13/18  Darel Hong, MD  lisinopril (PRINIVIL,ZESTRIL) 10 MG tablet Take 10 mg by mouth daily.    [provider]      Allergies Patient has no known allergies.  Family History  Family history unknown: Yes    Social History Social History   Tobacco Use  . Smoking status: Current Every Day Smoker    Packs/day: 1.00    Types: Cigarettes    Last attempt to quit: 04/17/2017    Years since quitting: 0.9  . Smokeless tobacco: Never Used  Substance Use Topics  . Alcohol use: No  . Drug use: No    Review of Systems  Constitutional: No fever/chills Eyes: No visual changes.  ENT: No sore throat. Cardiovascular: Denies chest pain. Respiratory: Denies shortness of breath. Gastrointestinal: No abdominal pain.  No nausea, no vomiting.   Genitourinary: Negative for dysuria. Musculoskeletal: Foot pain Skin: As above Neurological: Decreased sensation in the feet   ____________________________________________   PHYSICAL EXAM:  VITAL SIGNS: ED Triage Vitals  Enc Vitals Group     BP 03/22/18 0854 (!) 150/91     Pulse Rate 03/22/18 0854 91     Resp --      Temp 03/22/18 0854 98.5 F (36.9 C)     Temp Source 03/22/18 0854 Oral     SpO2 03/22/18 0854 99 %     Weight 03/22/18 0856 97.5 kg (215 lb)     Height 03/22/18 0856 1.981 m (6\' 6" )     Head Circumference --      Peak Flow --      Pain Score 03/22/18 0856 8  Pain Loc --      Pain Edu? --      Excl. in [place]? --     Constitutional: Alert and oriented. No acute distress. Pleasant and interactive Eyes: Conjunctivae are normal.   Nose: No congestion/rhinnorhea. Mouth/Throat: Mucous membranes are moist.    Cardiovascular: Normal rate, regular rhythm. Grossly normal heart sounds.  Good peripheral circulation. Respiratory: Normal respiratory effort.  No retractions. Lungs CTAB. Gastrointestinal: Soft and nontender. No distention.  No CVA tenderness. Genitourinary: deferred Musculoskeletal: Approximately 2 cm in diameter deep ulceration to the bottom of the left base of the great toe with surrounding erythema, foul smell consistent with  infection.  Great toe, second and third toe are gangrenous Neurologic:  Normal speech and language. No gross focal neurologic deficits are appreciated.  Skin: As above Psychiatric: Mood and affect are normal. Speech and behavior are normal.  ____________________________________________   LABS (all labs ordered are listed, but only abnormal results are displayed)  Labs Reviewed  CBC WITH DIFFERENTIAL/PLATELET - Abnormal; Notable for the following components:      Result Value   RBC 3.71 (*)    Hemoglobin 12.4 (*)    HCT 37.4 (*)    MCV 100.7 (*)    Monocytes Absolute 1.1 (*)    All other components within normal limits  COMPREHENSIVE METABOLIC PANEL - Abnormal; Notable for the following components:   Sodium 134 (*)    Chloride 100 (*)    Glucose, Bld 429 (*)    Calcium 8.5 (*)    Albumin 3.2 (*)    ALT 10 (*)    All other components within normal limits  CULTURE, BLOOD (ROUTINE X 2)  CULTURE, BLOOD (ROUTINE X 2)  LACTIC ACID, PLASMA  LACTIC ACID, PLASMA  HEMOGLOBIN A1C  PROTIME-INR   ____________________________________________  EKG   ____________________________________________  RADIOLOGY  X-ray of the foot no clear-cut osteomyelitis ____________________________________________   PROCEDURES  Procedure(s) performed: No  Procedures   Critical Care performed:no  ____________________________________________   INITIAL IMPRESSION / ASSESSMENT AND PLAN / ED COURSE  Pertinent labs & imaging results that were available during my care of the patient were reviewed by me and considered in my medical decision making (see chart for details).  Patient presents with complaints of foot pain/ulceration.  On exam the patient has dry gangrene of the toes with a deep ulceration with surrounding erythema, significant concern for osteomyelitis as well.  Gangrene appears chronic, doubt viability of the toes at this point, will admit to the hospital service prospect of  antibiotics    ____________________________________________   FINAL CLINICAL IMPRESSION(S) / ED DIAGNOSES  Final diagnoses:  Gangrene ([place])  Ischemia of toe  Wound infection        Note:  This document was prepared using Dragon voice recognition software and may include unintentional dictation errors.    Lavonia Drafts, MD 03/22/18 1320

## 2018-03-22 NOTE — H&P (Signed)
[place] [facility] [place] NAME: Gary Bentley    MR#:  [id number]  DATE OF BIRTH:  11/02/1972  DATE OF ADMISSION:  03/22/2018  PRIMARY CARE PHYSICIAN: Patient, No Pcp Per   REQUESTING/REFERRING PHYSICIAN: Dr. Lavonia Drafts  CHIEF COMPLAINT:   Chief Complaint  Patient presents with  . Foot Swelling    HISTORY OF PRESENT ILLNESS:  Gary Bentley  is a 47 y.o. male with a known history of uncontrolled diabetes mellitus not on any medication at this time, chronic calluses of both feet presents to hospital secondary to left foot swelling and open callus with purulent discharge. Patient is not a great historian.  He was admitted to an outside hospital in February for pneumonia and was started on Levemir 30 units twice a day for uncontrolled diabetes.  He was not taking any medications prior to that.  He was discharged on Levemir and metformin.  However patient mentions that he has not been taking any medications for more than a month now.  His sugars are in the 400s.  He has had chronic calluses on both feet going on for years.  But for the last week, noted some swelling of his left foot with pain on walking.  Denies any neuropathy at baseline.  He noticed an open callus on the plantar surface of left first toe with purulent discharge.  Denies any fevers, no nausea or vomiting.  Occasional chills.  Also his first 2 toes on the left foot are slightly dusky in appearance, has a weakly palpable dorsalis pedis pulse.  PAST MEDICAL HISTORY:   Past Medical History:  Diagnosis Date  . Diabetes mellitus without complication ([place])     PAST SURGICAL HISTORY:   Past Surgical History:  Procedure Laterality Date  . FOOT SURGERY      SOCIAL HISTORY:   Social History   Tobacco Use  . Smoking status: Current Every Day Smoker    Packs/day: 1.00    Types: Cigarettes    Last attempt to quit: 04/17/2017    Years since quitting: 0.9  . Smokeless tobacco:  Never Used  Substance Use Topics  . Alcohol use: No    FAMILY HISTORY:   Family History  Family history unknown: Yes    DRUG ALLERGIES:  No Known Allergies  REVIEW OF SYSTEMS:   Review of Systems  Constitutional: Positive for chills. Negative for fever, malaise/fatigue and weight loss.  HENT: Negative for ear discharge, ear pain, hearing loss and nosebleeds.   Eyes: Negative for blurred vision, double vision and photophobia.  Respiratory: Negative for cough, hemoptysis, shortness of breath and wheezing.   Cardiovascular: Negative for chest pain, palpitations, orthopnea and leg swelling.  Gastrointestinal: Negative for abdominal pain, constipation, diarrhea, heartburn, melena, nausea and vomiting.  Genitourinary: Negative for dysuria, frequency, hematuria and urgency.  Musculoskeletal: Positive for back pain and myalgias. Negative for neck pain.  Skin: Negative for rash.  Neurological: Negative for dizziness, tingling, tremors, sensory change, speech change, focal weakness and headaches.  Endo/Heme/Allergies: Does not bruise/bleed easily.  Psychiatric/Behavioral: Negative for depression.    MEDICATIONS AT HOME:   Prior to Admission medications   Medication Sig Start Date End Date Taking? Authorizing Provider  insulin detemir (LEVEMIR) 100 UNIT/ML injection Inject 30 Units into the skin 2 (two) times daily.    [provider]  insulin glargine (LANTUS) 100 UNIT/ML injection Inject 0.42 mLs (42 Units total) into the skin at bedtime. Patient not taking: Reported on  03/22/2018 09/05/17   Bettey Costa, MD  insulin glargine (LANTUS) 100 UNIT/ML injection Take 10 units SQ BID Patient not taking: Reported on 03/22/2018 10/13/17 10/13/18  Darel Hong, MD  metFORMIN (GLUCOPHAGE) 500 MG tablet Take 2 tablets (1,000 mg total) by mouth 2 (two) times daily with a meal. 10/13/17   Darel Hong, MD      VITAL SIGNS:  Blood pressure 132/89, pulse 64, temperature 98.2 F (36.8  C), temperature source Oral, resp. rate 18, height 6\' 6"  (1.981 m), weight 97.5 kg (215 lb), SpO2 100 %.  PHYSICAL EXAMINATION:   Physical Exam  GENERAL:  47 y.o.-year-old patient lying in the bed with no acute distress.  EYES: Pupils equal, round, reactive to light and accommodation. No scleral icterus. Extraocular muscles intact.  HEENT: Head atraumatic, normocephalic. Oropharynx and nasopharynx clear.  NECK:  Supple, no jugular venous distention. No thyroid enlargement, no tenderness.  LUNGS: Normal breath sounds bilaterally, no wheezing, rales,rhonchi or crepitation. No use of accessory muscles of respiration.  CARDIOVASCULAR: S1, S2 normal. No murmurs, rubs, or gallops.  ABDOMEN: Soft, nontender, nondistended. Bowel sounds present. No organomegaly or mass.  EXTREMITIES: left foot swelling present, 1+ DP pulses palpable bilaterally. left foot first toe is slightly cyanotic in appearance. Left foot first toe plantar surface has a open wound with purulent discharge.  Very thick calluses noted on both feet NEUROLOGIC: Cranial nerves II through XII are intact. Muscle strength 5/5 in all extremities. Sensation intact but slightly decreased in both feet. Gait not checked.  PSYCHIATRIC: The patient is alert and oriented x 3.  SKIN: No obvious rash, lesion, or ulcer.   LABORATORY PANEL:   CBC Recent Labs  Lab 03/22/18 0901  WBC 7.9  HGB 12.4*  HCT 37.4*  PLT 275   ------------------------------------------------------------------------------------------------------------------  Chemistries  Recent Labs  Lab 03/22/18 0901  NA 134*  K 4.4  CL 100*  CO2 27  GLUCOSE 429*  BUN 10  CREATININE 0.90  CALCIUM 8.5*  AST 15  ALT 10*  ALKPHOS 91  BILITOT 0.7   ------------------------------------------------------------------------------------------------------------------  Cardiac Enzymes No results for input(s): TROPONINI in the last 168  hours. ------------------------------------------------------------------------------------------------------------------  RADIOLOGY:  US Venous Img Lower Unilateral Left  Result Date: 03/22/2018 CLINICAL DATA:  Swelling and redness x1 week EXAM: LEFT LOWER EXTREMITY VENOUS DOPPLER ULTRASOUND TECHNIQUE: Gray-scale sonography with compression, as well as color and duplex ultrasound, were performed to evaluate the deep venous system from the level of the common femoral vein through the popliteal and proximal calf veins. COMPARISON:  None FINDINGS: Normal compressibility of the common femoral, superficial femoral, and popliteal veins, as well as the proximal calf veins. No filling defects to suggest DVT on grayscale or color Doppler imaging. Doppler waveforms show normal direction of venous flow, normal respiratory phasicity and response to augmentation. Survey views of the contralateral common femoral vein are unremarkable. IMPRESSION: No evidence of left lower extremity deep vein thrombosis. Electronically Signed   By: Lucrezia Europe M.D.   On: 03/22/2018 09:43   Dg Foot Complete Left  Result Date: 03/22/2018 CLINICAL DATA:  Diabetic ulcer.  Gangrenous great toe. EXAM: LEFT FOOT - COMPLETE 3+ VIEW COMPARISON:  12/13/2016. FINDINGS: Soft tissue swelling and ulceration left great toe. No clear-cut bony erosive change noted. No evidence of fracture or dislocation. Soft tissue swelling is also noted focally of the lateral aspect of the foot as well. IMPRESSION: Soft tissue swelling and ulceration left great toe. No clearcut underlying bony erosion or focal acute bony  abnormality. 2. Soft tissue focal swelling noted over the lateral aspect of the foot as well. Electronically Signed   By: Marcello Moores  Register   On: 03/22/2018 11:05    EKG:  No orders found for this or any previous visit.  IMPRESSION AND PLAN:   Gary Bentley  is a 47 y.o. male with a known history of uncontrolled diabetes mellitus not on any  medication at this time, chronic calluses of both feet presents to hospital secondary to left foot swelling and open callus with purulent discharge.  1.  Left foot cellulitis and open diabetic ulcer-admit, x-rays pending. -Started on vancomycin and Zosyn.  Podiatry consult to see if he needs any debridement   2.  Peripheral vascular disease-poorly palpable dorsalis pedis pulses some some dusky discoloration of left foot toes. -We will consult vascular team.  Started on aspirin for now  3.  Uncontrolled diabetes mellitus-we will restart twice daily Levemir, add sliding scale insulin.  Check A1c  4.  Tobacco use disorder-counseled against smoking.  Refused a nicotine patch  5.  DVT prophylaxis-Lovenox    All the records are reviewed and case discussed with ED provider. Management plans discussed with the patient, family and they are in agreement.  CODE STATUS: Full code  TOTAL TIME TAKING CARE OF THIS PATIENT: 50 minutes.    Gladstone Lighter M.D on 03/22/2018 at 11:18 AM  Between 7am to 6pm - Pager - [phone]  After 6pm go to [email or web address] - password [email or web address] [facility]  Office  [phone]  CC: Primary care physician; Patient, No Pcp Per

## 2018-03-22 NOTE — ED Notes (Signed)
First Nurse Note: Patient complaining of pain and swelling left foot and leg.  Patient states is diabetic.  BS in the 200's yesterday, has not checked it today.

## 2018-03-22 NOTE — Consult Note (Signed)
Pharmacy Antibiotic Note  Gary Bentley is a 47 y.o. male admitted on 03/22/2018 with cellulitis.  Pharmacy has been consulted for vancomycin and zosyn dosing. Patient received vancomycin 1g nIV x 1 dose and Zosyn 3.375 IV x 1 dose in ED  Plan: Ke: 0.104   T1/2: 6.7   Vd: 60.2   Start Vancomycin 1250  IV every 8 hours with 6 hour stack dosing.  Goal trough 15 mcg/mL. Calculated trough at Css 15. Trough level ordered prior to 4th dose. Will monitor renal function and adjust dose as needed.   Start Zosyn 3.375 IV EI every 8 hours.   Height: 6\' 6"  (198.1 cm) Weight: 215 lb (97.5 kg) IBW/kg (Calculated) : 91.4  Temp (24hrs), Avg:98.4 F (36.9 C), Min:98.2 F (36.8 C), Max:98.6 F (37 C)  Recent Labs  Lab 03/22/18 0901 03/22/18 1025 03/22/18 1315  WBC 7.9  --   --   CREATININE 0.90  --   --   LATICACIDVEN  --  0.9 0.8    Estimated Creatinine Clearance: 135.4 mL/min (by C-G formula based on SCr of 0.9 mg/dL).    No Known Allergies  Antimicrobials this admission: 4/30 vancomycin  >>  4/30 Zosyn >>  Dose adjustments this admission:  Microbiology results:  BCx: pending  Thank you for allowing pharmacy to be a part of this patient's care.  Pernell Dupre, PharmD, BCPS Clinical Pharmacist 03/22/2018 2:47 PM

## 2018-03-22 NOTE — Consult Note (Signed)
ORTHOPAEDIC CONSULTATION  REQUESTING PHYSICIAN: Gladstone Lighter, MD  Chief Complaint: Left great toe ulceration  HPI: Gary Bentley is a 47 y.o. male who complains of ulcer to his left foot.  He has a known history of diabetes with neuropathy.  Admitted today from the ER with worsening swelling and redness to his left great toe.  Drainage was noted.  Past Medical History:  Diagnosis Date  . Diabetes mellitus without complication [facility])    Past Surgical History:  Procedure Laterality Date  . FOOT SURGERY     Social History   Socioeconomic History  . Marital status: Single    Spouse name: Not on file  . Number of children: Not on file  . Years of education: Not on file  . Highest education level: Not on file  Occupational History  . Not on file  Social Needs  . Financial resource strain: Not on file  . Food insecurity:    Worry: Not on file    Inability: Not on file  . Transportation needs:    Medical: Not on file    Non-medical: Not on file  Tobacco Use  . Smoking status: Current Every Day Smoker    Packs/day: 1.00    Types: Cigarettes    Last attempt to quit: 04/17/2017    Years since quitting: 0.9  . Smokeless tobacco: Never Used  Substance and Sexual Activity  . Alcohol use: No  . Drug use: No  . Sexual activity: Not on file  Lifestyle  . Physical activity:    Days per week: Not on file    Minutes per session: Not on file  . Stress: Not on file  Relationships  . Social connections:    Talks on phone: Not on file    Gets together: Not on file    Attends religious service: Not on file    Active member of club or organization: Not on file    Attends meetings of clubs or organizations: Not on file    Relationship status: Not on file  Other Topics Concern  . Not on file  Social History Narrative   No place to live at this time, independent otherwise   Family History  Family history unknown: Yes   No Known Allergies Prior to Admission medications    Medication Sig Start Date End Date Taking? Authorizing Provider  insulin detemir (LEVEMIR) 100 UNIT/ML injection Inject 30 Units into the skin 2 (two) times daily.   Yes [provider]  insulin glargine (LANTUS) 100 UNIT/ML injection Inject 0.42 mLs (42 Units total) into the skin at bedtime. Patient not taking: Reported on 03/22/2018 09/05/17   Bettey Costa, MD  insulin glargine (LANTUS) 100 UNIT/ML injection Take 10 units SQ BID Patient not taking: Reported on 03/22/2018 10/13/17 10/13/18  Darel Hong, MD  lisinopril (PRINIVIL,ZESTRIL) 10 MG tablet Take 10 mg by mouth daily.    [provider]  metFORMIN (GLUCOPHAGE) 500 MG tablet Take 2 tablets (1,000 mg total) by mouth 2 (two) times daily with a meal. 10/13/17   Darel Hong, MD   US Venous Img Lower Unilateral Left  Result Date: 03/22/2018 CLINICAL DATA:  Swelling and redness x1 week EXAM: LEFT LOWER EXTREMITY VENOUS DOPPLER ULTRASOUND TECHNIQUE: Gray-scale sonography with compression, as well as color and duplex ultrasound, were performed to evaluate the deep venous system from the level of the common femoral vein through the popliteal and proximal calf veins. COMPARISON:  None FINDINGS: Normal compressibility of the common femoral, superficial femoral,  and popliteal veins, as well as the proximal calf veins. No filling defects to suggest DVT on grayscale or color Doppler imaging. Doppler waveforms show normal direction of venous flow, normal respiratory phasicity and response to augmentation. Survey views of the contralateral common femoral vein are unremarkable. IMPRESSION: No evidence of left lower extremity deep vein thrombosis. Electronically Signed   By: Lucrezia Europe M.D.   On: 03/22/2018 09:43   Dg Foot Complete Left  Result Date: 03/22/2018 CLINICAL DATA:  Diabetic ulcer.  Gangrenous great toe. EXAM: LEFT FOOT - COMPLETE 3+ VIEW COMPARISON:  12/13/2016. FINDINGS: Soft tissue swelling and ulceration left great toe.  No clear-cut bony erosive change noted. No evidence of fracture or dislocation. Soft tissue swelling is also noted focally of the lateral aspect of the foot as well. IMPRESSION: Soft tissue swelling and ulceration left great toe. No clearcut underlying bony erosion or focal acute bony abnormality. 2. Soft tissue focal swelling noted over the lateral aspect of the foot as well. Electronically Signed   By: Marcello Moores  Register   On: 03/22/2018 11:05    Positive ROS: All other systems have been reviewed and were otherwise negative with the exception of those mentioned in the HPI and as above.  12 point ROS was performed.  Physical Exam: General: Alert and oriented.  No apparent distress.  Vascular:  Left foot:Dorsalis Pedis:  present Posterior Tibial:  present  Right foot: Dorsalis Pedis:  present Posterior Tibial:  present  Neuro:absent sensation  Derm: Multiple pre-ulcerative lesions under his right fifth and first toes.  Also under his left fifth MTPJ pre-ulcerative lesion.  Noted full-thickness ulceration to his left great toe.  This had surrounding hyperkeratotic with nonviable fibrotic tissue to the central aspect of the epidermis and dermal layer into subtenons tissue.  There was noted purulent drainage.  This did not probe to bone.  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  This was excisional debridement with a 15 blade.  Excisional debridement was performed down to the subtenons tissue.  The ulceration measured approximately 3 cm in diameter after final debridement was performed.  This did not exposed joint or capsule or bone.  Bandaging was applied.  Patient should continue with daily dressing changes and flush the wound with saline and cover with antibiotic ointment Bactroban  and a bandage.  She will follow-up in the outpatient clinic in 3 to 5 days.  Wound culture was performed.  Recommend broad-spectrum antibiotics for now.    Elesa Hacker, DPM Cell [phone]   03/22/2018 6:29 PM

## 2018-03-22 NOTE — ED Triage Notes (Signed)
Pt to ED via POV c/o left foot swelling x 1 week. Pt states that he has calluses on both feet. Pt states that his left foot is painful. Left leg is red, swollen, and warm. Pt great left toe is swollen and weeping, pt states that it has odor to it. Black discoloration of toe noted. Pt reports hx/o DM.

## 2018-03-22 NOTE — ED Notes (Signed)
First Nurse Note:  Patient to 1H per Dr. Corky Downs.  Kirke Shaggy RN aware of placement.

## 2018-03-22 NOTE — ED Notes (Signed)
RN unable to take report at this time 

## 2018-03-22 NOTE — ED Notes (Signed)
ED Provider at bedside. 

## 2018-03-23 ENCOUNTER — Encounter
Admission: EM | Disposition: A | Discharge status: Home / Self Care | Payer: Self-pay | Source: Home / Self Care | Attending: Family Medicine

## 2018-03-23 ENCOUNTER — Inpatient Hospital Stay: Payer: Self-pay

## 2018-03-23 LAB — BASIC METABOLIC PANEL
Anion gap: 5 (ref 5–15)
BUN: 9 mg/dL (ref 6–20)
CO2: 28 mmol/L (ref 22–32)
Calcium: 8.5 mg/dL — ABNORMAL LOW (ref 8.9–10.3)
Chloride: 108 mmol/L (ref 101–111)
Creatinine, Ser: 0.68 mg/dL (ref 0.61–1.24)
GFR calc non Af Amer: >60 mL/min (ref >60)
Glucose, Bld: 78 mg/dL (ref 65–99)
POTASSIUM: 3.4 mmol/L — AB (ref 3.5–5.1)
Sodium: 141 mmol/L (ref 135–145)

## 2018-03-23 LAB — GLUCOSE, CAPILLARY
GLUCOSE-CAPILLARY: 81 mg/dL (ref 65–99)
Glucose-Capillary: 239 mg/dL — ABNORMAL HIGH (ref 65–99)
Glucose-Capillary: 71 mg/dL (ref 65–99)
Glucose-Capillary: 193 mg/dL — ABNORMAL HIGH (ref 65–99)
Glucose-Capillary: 159 mg/dL — ABNORMAL HIGH (ref 65–99)

## 2018-03-23 LAB — VANCOMYCIN, TROUGH: Vancomycin Tr: 14 ug/mL — ABNORMAL LOW (ref 15–20)

## 2018-03-23 LAB — CBC
HEMATOCRIT: 34.2 % — AB (ref 40.0–52.0)
Hemoglobin: 11.7 g/dL — ABNORMAL LOW (ref 13.0–18.0)
MCH: 34.4 pg — ABNORMAL HIGH (ref 26.0–34.0)
MCHC: 34.2 g/dL (ref 32.0–36.0)
MCV: 100.7 fL — ABNORMAL HIGH (ref 80.0–100.0)
PLATELETS: 243 10*3/uL (ref 150–440)
RBC: 3.39 MIL/uL — AB (ref 4.40–5.90)
RDW: 13.4 % (ref 11.5–14.5)
WBC: 5.6 10*3/uL (ref 3.8–10.6)

## 2018-03-23 SURGERY — PERIPHERAL VASCULAR BALLOON ANGIOPLASTY
Anesthesia: Moderate Sedation | Laterality: Left

## 2018-03-23 MED ORDER — INSULIN STARTER KIT- SYRINGES (ENGLISH)
1.0000 | Freq: Once | Status: AC
  Administered 2018-03-23: 1
  Filled 2018-03-23: qty 1

## 2018-03-23 MED ORDER — SULFAMETHOXAZOLE-TRIMETHOPRIM 800-160 MG PO TABS: 1.0000 | ORAL_TABLET | Freq: Two times a day (BID) | ORAL | 0 refills | Status: DC

## 2018-03-23 MED ORDER — LIVING WELL WITH DIABETES BOOK
Freq: Once | Status: AC
  Administered 2018-03-23: 15:00:00
  Filled 2018-03-23: qty 1

## 2018-03-23 MED ORDER — INSULIN ASPART PROT & ASPART (70-30 MIX) 100 UNIT/ML ~~LOC~~ SUSP
30.0000 [IU] | Freq: Two times a day (BID) | SUBCUTANEOUS | Status: DC
  Administered 2018-03-23 – 2018-03-24 (×3): 30 [IU] via SUBCUTANEOUS
  Filled 2018-03-23 (×3): qty 1

## 2018-03-23 MED ORDER — POTASSIUM CHLORIDE CRYS ER 20 MEQ PO TBCR
40.0000 meq | EXTENDED_RELEASE_TABLET | Freq: Once | ORAL | Status: AC
  Administered 2018-03-23: 40 meq via ORAL
  Filled 2018-03-23: qty 2

## 2018-03-23 MED ORDER — INSULIN ASPART PROT & ASPART (70-30 MIX) 100 UNIT/ML ~~LOC~~ SUSP: 30.0000 [IU] | Freq: Two times a day (BID) | SUBCUTANEOUS | 11 refills | Status: DC

## 2018-03-23 MED ORDER — METFORMIN HCL 500 MG PO TABS: 1000.0000 mg | ORAL_TABLET | Freq: Two times a day (BID) | ORAL | 0 refills | Status: DC

## 2018-03-23 MED ORDER — LISINOPRIL 10 MG PO TABS: 10.0000 mg | ORAL_TABLET | Freq: Every day | ORAL | 0 refills | Status: DC

## 2018-03-23 NOTE — Care Management Note (Signed)
Case Management Note  Patient Details  Name: Gary Bentley MRN: [id number] Date of Birth: 11/02/1972  Subjective/Objective:   Application given for [facility] and medication management clinic. Referral sent to both agencies. RNCM answered all patient questions                 Action/Plan:   Expected Discharge Date:  03/25/18               Expected Discharge Plan:     In-House Referral:     Discharge planning Services  CM Consult, [facility], Medication Assistance  Post Acute Care Choice:    Choice offered to:  Patient  DME Arranged:    DME Agency:     HH Arranged:    [place] Agency:     Status of Service:  In process, will continue to follow  If discussed at Long Length of Stay Meetings, dates discussed:    Additional Comments:  Jolly Mango, RN 03/23/2018, 10:09 AM

## 2018-03-23 NOTE — Consult Note (Signed)
Pharmacy Antibiotic Note  Gary Bentley is a 47 y.o. male admitted on 03/22/2018 with cellulitis.  Pharmacy has been consulted for vancomycin and zosyn dosing. Patient received vancomycin 1g nIV x 1 dose and Zosyn 3.375 IV x 1 dose in ED  Plan: 5/1 1718 VT: 14. Will continue current regimen of  Vancomycin 1250  IV every 8 hours. Recheck trough after 4 doses.  Will monitor renal function and adjust dose as needed.   Continue  Zosyn 3.375 IV EI every 8 hours.   Height: 6\' 6"  (198.1 cm) Weight: 215 lb (97.5 kg) IBW/kg (Calculated) : 91.4  Temp (24hrs), Avg:98.2 F (36.8 C), Min:97.7 F (36.5 C), Max:98.5 F (36.9 C)  Recent Labs  Lab 03/22/18 0901 03/22/18 1025 03/22/18 1315 03/23/18 0250 03/23/18 1718  WBC 7.9  --   --  5.6  --   CREATININE 0.90  --   --  0.68  --   LATICACIDVEN  --  0.9 0.8  --   --   VANCOTROUGH  --   --   --   --  14*    Estimated Creatinine Clearance: 152.3 mL/min (by C-G formula based on SCr of 0.68 mg/dL).    No Known Allergies  Antimicrobials this admission: 4/30 vancomycin  >>  4/30 Zosyn >>  Dose adjustments this admission:  Microbiology results:  BCx: NG TD  Thank you for allowing pharmacy to be a part of this patient's care.  Pernell Dupre, PharmD, BCPS Clinical Pharmacist 03/23/2018 7:10 PM

## 2018-03-23 NOTE — Progress Notes (Addendum)
Inpatient Diabetes Program Recommendations  AACE/ADA: New Consensus Statement on Inpatient Glycemic Control (2015)  Target Ranges:  Prepandial:   less than 140 mg/dL      Peak postprandial:   less than 180 mg/dL (1-2 hours)      Critically ill patients:  140 - 180 mg/dL   Lab Results  Component Value Date   GLUCAP 71 03/23/2018   HGBA1C 12.4 (H) 03/22/2018    Review of Glycemic Control Results for MILAS, SCHAPPELL (MRN [id number]) as of 03/23/2018 08:58  Ref. Range 03/22/2018 13:29 03/22/2018 17:02 03/23/2018 07:47  Glucose-Capillary Latest Ref Range: 65 - 99 mg/dL 277 (H) 274 (H) 71   Diabetes history: DM2 Outpatient Diabetes medications: Lantus 42 units daily, Metformin 1000 mg bid Current orders for Inpatient glycemic control: Levemir 25 units bid +Novolog sensitive correction tid + hs  Inpatient Diabetes Program Recommendations:   Fasting CBG 71. -decrease Levemir to 20 units bid.  12:00 Met with patient @ bedside to discuss diabetes management. Pt has not taken his medications for over a month due to cost and has moved here from [place], [place].  Spoke with pt about  A1C 12.4 (average blood glucose of 309 over the past 2-3 months) and explained what an A1C is, basic pathophysiology of DM Type 2, basic home care, basic diabetes diet nutrition principles, importance of checking CBGs and maintaining good CBG control to prevent long-term and short-term complications. Reviewed signs and symptoms of hyperglycemia and hypoglycemia and how to treat hypoglycemia at home. Also reviewed blood sugar goals at home.  RNs to provide ongoing basic DM education at bedside with this patient. Have ordered educational booklet and insulin starter kit. If patient transitioned to Novolin 70/30 insulin from [facility] can get a 1000 unit vial for $25. 70/30 30 units bid = approximately 42 basal insulin + 18 units meal coverage.  Thank you, Nani Gasser. Emalee Knies, RN, MSN, CDE  Diabetes Coordinator Inpatient  Glycemic Control Team Team Pager [phone] (8am-5pm) 03/23/2018 8:57 AM

## 2018-03-23 NOTE — Discharge Summary (Signed)
Gary Bentley, is a 47 y.o. male  DOB 11/02/1972  MRN [id number].  Admission date:  03/22/2018  Admitting Physician  Gladstone Lighter, MD  Discharge Date:  03/23/2018   Primary MD  Patient, No Pcp Per  Recommendations for primary care physician for things to follow:   Patient moved from [facility].  Discharge home, will follow-up medication management for medication need for diabetes.  Also will set up appointment with [facility] as a PCP.   Admission Diagnosis  Gangrene ([place]) [I96] Wound infection [T14.8XXA, L08.9] Ischemia of toe [I99.8]   Discharge Diagnosis  Gangrene ([place]) [I96] Wound infection [T14.8XXA, L08.9] Ischemia of toe [I99.8]    Active Problems:   Cellulitis      Past Medical History:  Diagnosis Date  . Diabetes mellitus without complication [facility])     Past Surgical History:  Procedure Laterality Date  . FOOT SURGERY         History of present illness and  Hospital Course:     Kindly see H&P for history of present illness and admission details, please review complete Labs, Consult reports and Test reports for all details in brief  HPI  from the history and physical done on the day of admission 47 year old male patient with history of diabetes mellitus type 2, noncompliance with medicines due to financial reasons admitted for left foot cellulitis.  X-ray negative for fractures   Hospital Course  Left Foot cellulitis: CBC, Chem-7 largely within normal limits, seen by podiatry.  Treatment of both feet calluses removed and dressing applied.  Left great toe ulcers due to hyperkeratotic lesions and full-thickness ulcers of the left great toe, seen by podiatry, purulent drainage observed, patient had debridement at bedside by podiatry, patient ulcers 0.3 cm in diameter.  Did not have any  exposure of the joints or capsule.  Bandage applied.  Continue dressing changes and flush wound saline and covered with antibiotic ointment Bactroban every day.  Patient can follow-up with podiatry as an outpatient in 3 to 5 days.  Patient started on vancomycin, Zosyn in the hospital.discharge home with 7 days course of augmentin.  Home health nurse for dressing changes. 2.  PVD, patient seen by vascular physician, continue aspirin for now, patient needs left leg angiogram as an outpatient. 3.  Uncontrolled diabetes mellitus type 2: Noncompliance with medicines secondary to financial reasons, patient hemoglobin A1c around 12.  Diabetes coordinator recommended Novolin 70/30 30 units twice daily.  Also given medication management application.  She can continue metformin 1 g p.o. twice daily.    Follow UP  Follow-up Information    Rusty Aus, MD. Schedule an appointment as soon as possible for a visit.   Specialty:  Internal Medicine Why:  new patient Contact information: [place] [address] [phone]             Discharge Instructions  and  Discharge Medication    Allergies as of 03/23/2018   No Known Allergies     Medication List    STOP taking these medications   insulin detemir 100 UNIT/ML injection Commonly known as:  LEVEMIR   insulin glargine 100 UNIT/ML injection Commonly known as:  LANTUS     TAKE these medications   insulin aspart protamine- aspart (70-30) 100 UNIT/ML injection Commonly known as:  NOVOLOG MIX 70/30 Inject 0.3 mLs (30 Units total) into the skin 2 (two) times daily with a meal.   lisinopril 10 MG tablet Commonly  known as:  PRINIVIL,ZESTRIL Take 1 tablet (10 mg total) by mouth daily.   metFORMIN 500 MG tablet Commonly known as:  GLUCOPHAGE Take 2 tablets (1,000 mg total) by mouth 2 (two) times daily with a meal.         Diet and Activity recommendation: See Discharge  Instructions above   Consults obtained -diabetes coordinator   Major procedures and Radiology Reports - PLEASE review detailed and final reports for all details, in brief -     US Venous Img Lower Unilateral Left  Result Date: 03/22/2018 CLINICAL DATA:  Swelling and redness x1 week EXAM: LEFT LOWER EXTREMITY VENOUS DOPPLER ULTRASOUND TECHNIQUE: Gray-scale sonography with compression, as well as color and duplex ultrasound, were performed to evaluate the deep venous system from the level of the common femoral vein through the popliteal and proximal calf veins. COMPARISON:  None FINDINGS: Normal compressibility of the common femoral, superficial femoral, and popliteal veins, as well as the proximal calf veins. No filling defects to suggest DVT on grayscale or color Doppler imaging. Doppler waveforms show normal direction of venous flow, normal respiratory phasicity and response to augmentation. Survey views of the contralateral common femoral vein are unremarkable. IMPRESSION: No evidence of left lower extremity deep vein thrombosis. Electronically Signed   By: Lucrezia Europe M.D.   On: 03/22/2018 09:43   Dg Foot Complete Left  Result Date: 03/22/2018 CLINICAL DATA:  Diabetic ulcer.  Gangrenous great toe. EXAM: LEFT FOOT - COMPLETE 3+ VIEW COMPARISON:  12/13/2016. FINDINGS: Soft tissue swelling and ulceration left great toe. No clear-cut bony erosive change noted. No evidence of fracture or dislocation. Soft tissue swelling is also noted focally of the lateral aspect of the foot as well. IMPRESSION: Soft tissue swelling and ulceration left great toe. No clearcut underlying bony erosion or focal acute bony abnormality. 2. Soft tissue focal swelling noted over the lateral aspect of the foot as well. Electronically Signed   By: Marcello Moores  Register   On: 03/22/2018 11:05    Micro Results     Recent Results (from the past 240 hour(s))  Blood culture (routine x 2)     Status: None (Preliminary result)    Collection Time: 03/22/18 10:25 AM  Result Value Ref Range Status   Specimen Description BLOOD L AC  Final   Special Requests   Final    BOTTLES DRAWN AEROBIC AND ANAEROBIC Blood Culture adequate volume   Culture   Final    NO GROWTH < 24 HOURS Performed at [facility], [address] [place], [place] [address]    Report Status PENDING  Incomplete  Blood culture (routine x 2)     Status: None (Preliminary result)   Collection Time: 03/22/18 10:25 AM  Result Value Ref Range Status   Specimen Description BLOOD RT ARM  Final   Special Requests   Final    BOTTLES DRAWN AEROBIC AND ANAEROBIC Blood Culture adequate volume   Culture   Final    NO GROWTH < 24 HOURS Performed at [facility], [address], [place], [place] [address]    Report Status PENDING  Incomplete  Aerobic/Anaerobic Culture (surgical/deep wound)     Status: None (Preliminary result)   Collection Time: 03/22/18  7:14 PM  Result Value Ref Range Status   Specimen Description   Final    WOUND Performed at [facility], [address], [place], [place] [address]    Special Requests   Final    NONE Performed  at [facility], [address] [place], [place] [address]    Gram Stain   Final    NO WBC SEEN RARE GRAM POSITIVE COCCI Performed at [facility], [place] [address], [place], [place] [address]    Culture PENDING  Incomplete   Report Status PENDING  Incomplete       Today   Subjective:   Gary Bentley today has no headache,no chest abdominal pain,no new weakness tingling or numbness, feels much better wants to go home today.   Objective:   Blood pressure 122/79, pulse (!) 54, temperature 98.5 F (36.9 C), temperature source Oral, resp. rate 18, height 6\' 6"  (1.981 m), weight 97.5 kg (215 lb), SpO2 97 %.   Intake/Output Summary (Last 24 hours) at 03/23/2018 1353 Last data filed at 03/23/2018 0648 Gross per 24 hour  Intake 2270 ml  Output 500 ml  Net  1770 ml    Exam Awake Alert, Oriented x 3, No new F.N deficits, Normal affect [place].AT,PERRAL Supple Neck,No JVD, No cervical lymphadenopathy appriciated.  Symmetrical Chest wall movement, Good air movement bilaterally, CTAB RRR,No Gallops,Rubs or new Murmurs, No Parasternal Heave +ve B.Sounds, Abd Soft, Non tender, No organomegaly appriciated, No rebound -guarding or rigidity. No Cyanosis, Clubbing or edema, No new Rash or bruise has dressing present for both feet.  Data Review   CBC w Diff:  Lab Results  Component Value Date   WBC 5.6 03/23/2018   HGB 11.7 (L) 03/23/2018   HCT 34.2 (L) 03/23/2018   PLT 243 03/23/2018   LYMPHOPCT 16 03/22/2018   MONOPCT 14 03/22/2018   EOSPCT 2 03/22/2018   BASOPCT 1 03/22/2018    CMP:  Lab Results  Component Value Date   NA 141 03/23/2018   K 3.4 (L) 03/23/2018   CL 108 03/23/2018   CO2 28 03/23/2018   BUN 9 03/23/2018   CREATININE 0.68 03/23/2018   PROT 7.1 03/22/2018   ALBUMIN 3.2 (L) 03/22/2018   BILITOT 0.7 03/22/2018   ALKPHOS 91 03/22/2018   AST 15 03/22/2018   ALT 10 (L) 03/22/2018  .   Total Time in preparing paper work, data evaluation and todays exam - 23 minutes  Epifanio Lesches M.D on 03/23/2018 at 1:53 PM    Note: This dictation was prepared with Dragon dictation along with smaller phrase technology. Any transcriptional errors that result from this process are unintentional.

## 2018-03-23 NOTE — Progress Notes (Signed)
Daily Progress Note   Subjective  - * No surgery date entered *  F/u left foot ulcer   Objective Vitals:   03/22/18 1714 03/23/18 0028 03/23/18 0748 03/23/18 1601  BP: 139/90 (!) 127/97 122/79 (!) 144/87  Pulse: 60 60 (!) 54 (!) 56  Resp:  19 18 20   Temp: 98.6 F (37 C) 97.7 F (36.5 C) 98.5 F (36.9 C) 98.4 F (36.9 C)  TempSrc: Oral Oral Oral Oral  SpO2: 100% 97% 97% 100%  Weight:      Height:        Physical Exam: Dressing changed.  Noted area with draining purulence found today.  Area probes deep to distal phalanx.  Laboratory CBC    Component Value Date/Time   WBC 5.6 03/23/2018 0250   HGB 11.7 (L) 03/23/2018 0250   HCT 34.2 (L) 03/23/2018 0250   PLT 243 03/23/2018 0250    BMET    Component Value Date/Time   NA 141 03/23/2018 0250   K 3.4 (L) 03/23/2018 0250   CL 108 03/23/2018 0250   CO2 28 03/23/2018 0250   GLUCOSE 78 03/23/2018 0250   BUN 9 03/23/2018 0250   CREATININE 0.68 03/23/2018 0250   CALCIUM 8.5 (L) 03/23/2018 0250   GFRNONAA >60 03/23/2018 0250   GFRAA >60 03/23/2018 0250    Assessment/Planning: Abscess left hallux. DM with neuropathy.   Will plan for MRI of left great toe.  High suspicion of osteomyelitis.  May need debridment.    Will f/u tomorrow.   Samara Deist A  03/23/2018, 4:48 PM

## 2018-03-23 NOTE — Care Management (Signed)
Prescriptions faxed to medication management by primary nurse.

## 2018-03-23 NOTE — Progress Notes (Signed)
NPO ordered but pt already finished breakfast. Maudie Mercury, PA made aware. Asked pt of any procedure today, pt unaware of any. Inquiry to [facility] sent.

## 2018-03-23 NOTE — Progress Notes (Signed)
[facility] NAME: Gary Bentley    MR#:  [id number]  DATE OF BIRTH:  11/02/1972  SUBJECTIVE: Discharge canceled because of possible recurrent abscess left great toe by podiatry requiring MRI evaluation to make sure there is no osteomyelitis.  Continue IV antibiotics.  CHIEF COMPLAINT:   Chief Complaint  Patient presents with  . Foot Swelling    REVIEW OF SYSTEMS:    Review of Systems  Constitutional: Negative for chills and fever.  HENT: Negative for hearing loss.   Eyes: Negative for blurred vision, double vision and photophobia.  Respiratory: Negative for cough, hemoptysis and shortness of breath.   Cardiovascular: Negative for palpitations, orthopnea and leg swelling.  Gastrointestinal: Negative for abdominal pain, diarrhea and vomiting.  Genitourinary: Negative for dysuria and urgency.  Musculoskeletal: Negative for myalgias and neck pain.       Dressing present on both feet.  Skin: Negative for rash.  Neurological: Negative for dizziness, focal weakness, seizures, weakness and headaches.  Psychiatric/Behavioral: Negative for memory loss. The patient does not have insomnia.     Nutrition:  Tolerating Diet: Tolerating PT:      DRUG ALLERGIES:  No Known Allergies  VITALS:  Blood pressure (!) 144/87, pulse (!) 56, temperature 98.4 F (36.9 C), temperature source Oral, resp. rate 20, height 6\' 6"  (1.981 m), weight 97.5 kg (215 lb), SpO2 100 %.  PHYSICAL EXAMINATION:   Physical Exam  GENERAL:  47 y.o.-year-old patient lying in the bed with no acute distress.  EYES: Pupils equal, round, reactive to light and accommodation. No scleral icterus. Extraocular muscles intact.  HEENT: Head atraumatic, normocephalic. Oropharynx and nasopharynx clear.  NECK:  Supple, no jugular venous distention. No thyroid enlargement, no tenderness.  LUNGS: Normal breath sounds bilaterally, no wheezing, rales,rhonchi or  crepitation. No use of accessory muscles of respiration.  CARDIOVASCULAR: S1, S2 normal. No murmurs, rubs, or gallops.  ABDOMEN: Soft, nontender, nondistended. Bowel sounds present. No organomegaly or mass.  EXTREMITIES: Patient has dressing on both feet NEUROLOGIC: Cranial nerves II through XII are intact. Muscle strength 5/5 in all extremities. Sensation intact. Gait not checked.  PSYCHIATRIC: The patient is alert and oriented x 3.  SKIN: No obvious rash, lesion, or ulcer.    LABORATORY PANEL:   CBC Recent Labs  Lab 03/23/18 0250  WBC 5.6  HGB 11.7*  HCT 34.2*  PLT 243   ------------------------------------------------------------------------------------------------------------------  Chemistries  Recent Labs  Lab 03/22/18 0901 03/23/18 0250  NA 134* 141  K 4.4 3.4*  CL 100* 108  CO2 27 28  GLUCOSE 429* 78  BUN 10 9  CREATININE 0.90 0.68  CALCIUM 8.5* 8.5*  AST 15  --   ALT 10*  --   ALKPHOS 91  --   BILITOT 0.7  --    ------------------------------------------------------------------------------------------------------------------  Cardiac Enzymes No results for input(s): TROPONINI in the last 168 hours. ------------------------------------------------------------------------------------------------------------------  RADIOLOGY:  US Venous Img Lower Unilateral Left  Result Date: 03/22/2018 CLINICAL DATA:  Swelling and redness x1 week EXAM: LEFT LOWER EXTREMITY VENOUS DOPPLER ULTRASOUND TECHNIQUE: Gray-scale sonography with compression, as well as color and duplex ultrasound, were performed to evaluate the deep venous system from the level of the common femoral vein through the popliteal and proximal calf veins. COMPARISON:  None FINDINGS: Normal compressibility of the common femoral, superficial femoral, and popliteal veins, as well as the proximal calf veins. No filling defects to suggest DVT on grayscale or color Doppler imaging. Doppler  waveforms show normal  direction of venous flow, normal respiratory phasicity and response to augmentation. Survey views of the contralateral common femoral vein are unremarkable. IMPRESSION: No evidence of left lower extremity deep vein thrombosis. Electronically Signed   By: Lucrezia Europe M.D.   On: 03/22/2018 09:43   Dg Foot Complete Left  Result Date: 03/22/2018 CLINICAL DATA:  Diabetic ulcer.  Gangrenous great toe. EXAM: LEFT FOOT - COMPLETE 3+ VIEW COMPARISON:  12/13/2016. FINDINGS: Soft tissue swelling and ulceration left great toe. No clear-cut bony erosive change noted. No evidence of fracture or dislocation. Soft tissue swelling is also noted focally of the lateral aspect of the foot as well. IMPRESSION: Soft tissue swelling and ulceration left great toe. No clearcut underlying bony erosion or focal acute bony abnormality. 2. Soft tissue focal swelling noted over the lateral aspect of the foot as well. Electronically Signed   By: Marcello Moores  Register   On: 03/22/2018 11:05     ASSESSMENT AND PLAN:   Active Problems:   Cellulitis   Left foot cellulitis, status post debridement by podiatry, there is an area of concern on the left great toe with possible abscess: Continue IV vancomycin, Zosyn, obtain MRI of the left great toe to evaluate for osteomyelitis.  Continue dressing changes, cancel the discharge today.,  Appreciate Dr. Vickki Muff input.. 2.  Diabetes mellitus type 2 uncontrolled, financial problems at home so he is not taking insulin.  So discontinue Lantus at discharge, continue Novolin 70/30 30 units twice daily as per diabetic nurse recommendation and also metformin. 3.  Essential hypertension: Controlled. GI, DVT prophylaxis.     All the records are reviewed and case discussed with Care Management/Social Workerr. Management plans discussed with the patient, family and they are in agreement.  CODE STATUS: Full code  TOTAL TIME TAKING CARE OF THIS PATIENT: 35 minutes.   POSSIBLE D/C IN 1-2 DAYS,  DEPENDING ON CLINICAL CONDITION.   Epifanio Lesches M.D on 03/23/2018 at 4:56 PM  Between 7am to 6pm - Pager - [phone]  After 6pm go to [email or web address] - password [email or web address] [facility]  Office  [phone]  CC: Primary care physician; Patient, No Pcp Per

## 2018-03-23 NOTE — Progress Notes (Addendum)
Pt refuses to do the procedure. Consent Dr. Delana Meyer made aware.  0942: MD will cancel procedure. Pt ok to eat

## 2018-03-24 ENCOUNTER — Inpatient Hospital Stay: Payer: Self-pay

## 2018-03-24 LAB — GLUCOSE, CAPILLARY
GLUCOSE-CAPILLARY: 187 mg/dL — AB (ref 65–99)
GLUCOSE-CAPILLARY: 71 mg/dL (ref 65–99)
Glucose-Capillary: 128 mg/dL — ABNORMAL HIGH (ref 65–99)
Glucose-Capillary: 191 mg/dL — ABNORMAL HIGH (ref 65–99)

## 2018-03-24 LAB — C-REACTIVE PROTEIN: CRP: 4.5 mg/dL — AB (ref <1.0)

## 2018-03-24 MED ORDER — METFORMIN HCL 1000 MG PO TABS: 1000.0000 mg | ORAL_TABLET | Freq: Two times a day (BID) | ORAL | 0 refills | Status: DC

## 2018-03-24 MED ORDER — INSULIN ASPART PROT & ASPART (70-30 MIX) 100 UNIT/ML ~~LOC~~ SUSP: 30.0000 [IU] | Freq: Two times a day (BID) | SUBCUTANEOUS | 0 refills | Status: DC

## 2018-03-24 MED ORDER — DOXYCYCLINE HYCLATE 100 MG PO TABS: 100.0000 mg | ORAL_TABLET | Freq: Two times a day (BID) | ORAL | 0 refills | Status: DC

## 2018-03-24 MED ORDER — DOXYCYCLINE HYCLATE 100 MG PO TABS
100.0000 mg | ORAL_TABLET | Freq: Two times a day (BID) | ORAL | Status: DC
  Administered 2018-03-24: 100 mg via ORAL
  Filled 2018-03-24: qty 1

## 2018-03-24 MED ORDER — CIPROFLOXACIN HCL 500 MG PO TABS
500.0000 mg | ORAL_TABLET | Freq: Two times a day (BID) | ORAL | Status: DC
  Filled 2018-03-24 (×2): qty 1

## 2018-03-24 MED ORDER — CIPROFLOXACIN HCL 500 MG PO TABS: 500.0000 mg | ORAL_TABLET | Freq: Two times a day (BID) | ORAL | 0 refills | Status: DC

## 2018-03-24 NOTE — Discharge Instructions (Signed)
[facility] Stay Proper nutrition can help your body recover from illness and injury.   Foods and beverages high in protein, vitamins, and minerals help rebuild muscle loss, promote healing, & reduce fall risk.   In addition to eating healthy foods, a nutrition shake is an easy, delicious way to get the nutrition you need during and after your hospital stay  It is recommended that you continue to drink 2 bottles per day of:       Premier Protein for at least 1 month (30 days) after your hospital stay  Also take a daily multivitamin with minerals, and a daily Ocuvite.  Tips for adding a nutrition shake into your routine: As allowed, drink one with vitamins or medications instead of water or juice Enjoy one as a tasty mid-morning or afternoon snack Drink cold or make a milkshake out of it Drink one instead of milk with cereal or snacks Use as a coffee creamer   Available at the following grocery stores and pharmacies:           * [place] [phone]            For COUPONS visit: www.[email or web address]/join or [email or web address]   Suggested Substitutions Ensure Plus = Boost Plus = Carnation Breakfast Essentials = Boost Compact Ensure Active Clear = Boost Breeze Glucerna Shake = Boost Glucose Control = Carnation Breakfast Essentials SUGAR FREE

## 2018-03-24 NOTE — Progress Notes (Signed)
Uneventful night. Pt slept in long intervals. Pt independently gets up to br. piv patent. Call bell in reach.

## 2018-03-24 NOTE — Consult Note (Signed)
[facility] Infectious Disease     Reason for Consult: DM foot infection    Referring Physician: Roma Schanz Date of Admission:  03/22/2018   Active Problems:   Cellulitis   HPI: Gary Bentley is a 47 y.o. 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 No fever, wbc nml.  He was seen by podiatry and had MRI done. MRI showed marrow edema of the first distal phalanx and of the fifthmetatarsal head and neck associated with soft tissue ulcers concerning for early changes of osteomyelitis. Cx with GPC. Podiatry rec surgery but pt refuses.   Past Medical History:  Diagnosis Date  . Diabetes mellitus without complication [facility])    Past Surgical History:  Procedure Laterality Date  . FOOT SURGERY     Social History   Tobacco Use  . Smoking status: Current Every Day Smoker    Packs/day: 1.00    Types: Cigarettes    Last attempt to quit: 04/17/2017    Years since quitting: 0.9  . Smokeless tobacco: Never Used  Substance Use Topics  . Alcohol use: No  . Drug use: No   Family History  Family history unknown: Yes    Allergies: No Known Allergies  Current antibiotics: Antibiotics Given (last 72 hours)    Date/Time Action Medication Dose Rate   03/22/18 1034 New Bag/Given   piperacillin-tazobactam (ZOSYN) IVPB 3.375 g 3.375 g 100 mL/hr   03/22/18 1058 New Bag/Given   vancomycin (VANCOCIN) IVPB 1000 mg/200 mL premix 1,000 mg 200 mL/hr   03/22/18 1516 New Bag/Given   piperacillin-tazobactam (ZOSYN) IVPB 3.375 g 3.375 g 12.5 mL/hr   03/22/18 1812 New Bag/Given   vancomycin (VANCOCIN) 1,250 mg in sodium chloride 0.9 % 250 mL IVPB 1,250 mg 166.7 mL/hr   03/22/18 2045 New Bag/Given   piperacillin-tazobactam (ZOSYN) IVPB 3.375 g 3.375 g 12.5 mL/hr   03/23/18 0250 New Bag/Given   vancomycin (VANCOCIN) 1,250 mg in sodium chloride 0.9 % 250 mL IVPB 1,250 mg 166.7 mL/hr   03/23/18 0552 New Bag/Given    piperacillin-tazobactam (ZOSYN) IVPB 3.375 g 3.375 g 12.5 mL/hr   03/23/18 1047 New Bag/Given   vancomycin (VANCOCIN) 1,250 mg in sodium chloride 0.9 % 250 mL IVPB 1,250 mg 166.7 mL/hr   03/23/18 1435 New Bag/Given   piperacillin-tazobactam (ZOSYN) IVPB 3.375 g 3.375 g 12.5 mL/hr   03/23/18 1909 New Bag/Given   vancomycin (VANCOCIN) 1,250 mg in sodium chloride 0.9 % 250 mL IVPB 1,250 mg 166.7 mL/hr   03/23/18 2131 New Bag/Given   piperacillin-tazobactam (ZOSYN) IVPB 3.375 g 3.375 g 12.5 mL/hr   03/24/18 0154 New Bag/Given   vancomycin (VANCOCIN) 1,250 mg in sodium chloride 0.9 % 250 mL IVPB 1,250 mg 166.7 mL/hr   03/24/18 0500 New Bag/Given   piperacillin-tazobactam (ZOSYN) IVPB 3.375 g 3.375 g 12.5 mL/hr   03/24/18 1016 New Bag/Given   vancomycin (VANCOCIN) 1,250 mg in sodium chloride 0.9 % 250 mL IVPB 1,250 mg 166.7 mL/hr      MEDICATIONS: . aspirin EC  81 mg Oral Daily  . docusate sodium  100 mg Oral BID  . enoxaparin (LOVENOX) injection  40 mg Subcutaneous Q24H  . insulin aspart  0-5 Units Subcutaneous QHS  . insulin aspart  0-9 Units Subcutaneous TID WC  . insulin aspart protamine- aspart  30 Units Subcutaneous BID WC    Review of Systems - 11 systems reviewed and negative per HPI  OBJECTIVE: Temp:  [98.2 F (36.8 C)-98.4 F (36.9 C)] 98.2 F (36.8 C) (05/02 0814) Pulse Rate:  [56-62] 59 (05/02 0814) Resp:  [16-20] 18 (05/02 0814) BP: (120-144)/(70-93) 127/93 (05/02 0814) SpO2:  [94 %-100 %] 100 % (05/02 8938) Physical Exam  Constitutional: He is oriented to person, place, and time. He appears well-developed and well-nourished. No distress.  HENT:  Mouth/Throat: Oropharynx is clear and moist. No oropharyngeal exudate.  Cardiovascular: Normal rate, regular rhythm and normal heart sounds. Pulmonary/Chest: Effort normal and breath sounds normal. No respiratory distress. He has no wheezes.  Abdominal: Soft. Bowel sounds are normal. He exhibits no distension. There is no  tenderness.  Lymphadenopathy: He has no cervical adenopathy.  Neurological: bil LE numbness  Ext 2+ edema LLE Skin: L great toe with shallow ulcer, on the plantar surface and hole probes and is able to express thin pus.  Heaped up ulcer on lateral foot as well near base 5th MT Vascular- diminished LLE DP and PT  Psychiatric: He has a normal mood and affect. His behavior is normal.     LABS: Results for orders placed or performed during the hospital encounter of 03/22/18 (from the past 48 hour(s))  Lactic acid, plasma     Status: None   Collection Time: 03/22/18  1:15 PM  Result Value Ref Range   Lactic Acid, Venous 0.8 0.5 - 1.9 mmol/L    Comment: Performed at [facility], [address] [place], [place] [address]  Hemoglobin A1c     Status: Abnormal   Collection Time: 03/22/18  1:15 PM  Result Value Ref Range   Hgb A1c MFr Bld 12.4 (H) 4.8 - 5.6 %    Comment: (NOTE) Pre diabetes:          5.7%-6.4% Diabetes:              >6.4% Glycemic control for   <7.0% adults with diabetes    Mean Plasma Glucose 309.18 mg/dL    Comment: Performed at [facility] [address], [place], [place] [address]  Protime-INR     Status: None   Collection Time: 03/22/18  1:15 PM  Result Value Ref Range   Prothrombin Time 12.8 11.4 - 15.2 seconds   INR 0.97     Comment: Performed at [facility], [address] [place], [place] [address]  Glucose, capillary     Status: Abnormal   Collection Time: 03/22/18  1:29 PM  Result Value Ref Range   Glucose-Capillary 277 (H) 65 - 99 mg/dL  Glucose, capillary     Status: Abnormal   Collection Time: 03/22/18  5:02 PM  Result Value Ref Range   Glucose-Capillary 274 (H) 65 - 99 mg/dL  Aerobic/Anaerobic Culture (surgical/deep wound)     Status: None (Preliminary result)   Collection Time: 03/22/18  7:14 PM  Result Value Ref Range   Specimen Description      WOUND Performed at [facility], [address],  [place], [place] [address]    Special Requests      NONE Performed at [facility], [place], [place] [address]    Gram Stain NO WBC SEEN RARE GRAM POSITIVE COCCI     Culture      CULTURE REINCUBATED FOR BETTER GROWTH Performed at [facility], [place] [address], [place], [place] [address]    Report Status PENDING   Glucose, capillary     Status: Abnormal   Collection Time:  03/22/18  8:29 PM  Result Value Ref Range   Glucose-Capillary 239 (H) 65 - 99 mg/dL  Basic metabolic panel     Status: Abnormal   Collection Time: 03/23/18  2:50 AM  Result Value Ref Range   Sodium 141 135 - 145 mmol/L   Potassium 3.4 (L) 3.5 - 5.1 mmol/L   Chloride 108 101 - 111 mmol/L   CO2 28 22 - 32 mmol/L   Glucose, Bld 78 65 - 99 mg/dL   BUN 9 6 - 20 mg/dL   Creatinine, Ser 0.68 0.61 - 1.24 mg/dL   Calcium 8.5 (L) 8.9 - 10.3 mg/dL   GFR calc non Af Amer >60 >60 mL/min   GFR calc Af Amer >60 >60 mL/min    Comment: (NOTE) The eGFR has been calculated using the CKD EPI equation. This calculation has not been validated in all clinical situations. eGFR's persistently <60 mL/min signify possible Chronic Kidney Disease.    Anion gap 5 5 - 15    Comment: Performed at [facility], [address] [place], [place] [address]  CBC     Status: Abnormal   Collection Time: 03/23/18  2:50 AM  Result Value Ref Range   WBC 5.6 3.8 - 10.6 K/uL   RBC 3.39 (L) 4.40 - 5.90 MIL/uL   Hemoglobin 11.7 (L) 13.0 - 18.0 g/dL   HCT 34.2 (L) 40.0 - 52.0 %   MCV 100.7 (H) 80.0 - 100.0 fL   MCH 34.4 (H) 26.0 - 34.0 pg   MCHC 34.2 32.0 - 36.0 g/dL   RDW 13.4 11.5 - 14.5 %   Platelets 243 150 - 440 K/uL    Comment: Performed at [facility], [address] [place], [place] [address]  Glucose, capillary     Status: None   Collection Time: 03/23/18  7:47 AM  Result Value Ref Range   Glucose-Capillary 71 65 - 99 mg/dL  Glucose, capillary     Status: Abnormal   Collection Time:  03/23/18 12:14 PM  Result Value Ref Range   Glucose-Capillary 193 (H) 65 - 99 mg/dL  Glucose, capillary     Status: Abnormal   Collection Time: 03/23/18  4:42 PM  Result Value Ref Range   Glucose-Capillary 159 (H) 65 - 99 mg/dL  Vancomycin, trough     Status: Abnormal   Collection Time: 03/23/18  5:18 PM  Result Value Ref Range   Vancomycin Tr 14 (L) 15 - 20 ug/mL    Comment: Performed at [facility], [address] [place], [place] [address]  Glucose, capillary     Status: None   Collection Time: 03/23/18  9:01 PM  Result Value Ref Range   Glucose-Capillary 81 65 - 99 mg/dL  Glucose, capillary     Status: Abnormal   Collection Time: 03/24/18  8:15 AM  Result Value Ref Range   Glucose-Capillary 187 (H) 65 - 99 mg/dL  Glucose, capillary     Status: Abnormal   Collection Time: 03/24/18 11:49 AM  Result Value Ref Range   Glucose-Capillary 128 (H) 65 - 99 mg/dL   No components found for: ESR, C REACTIVE PROTEIN MICRO: Recent Results (from the past 720 hour(s))  Blood culture (routine x 2)     Status: None (Preliminary result)   Collection Time: 03/22/18 10:25 AM  Result Value Ref Range Status   Specimen Description BLOOD L AC  Final   Special Requests   Final    BOTTLES DRAWN AEROBIC AND  ANAEROBIC Blood Culture adequate volume   Culture   Final    NO GROWTH 2 DAYS Performed at [facility], [address] [place], [place] [address]    Report Status PENDING  Incomplete  Blood culture (routine x 2)     Status: None (Preliminary result)   Collection Time: 03/22/18 10:25 AM  Result Value Ref Range Status   Specimen Description BLOOD RT ARM  Final   Special Requests   Final    BOTTLES DRAWN AEROBIC AND ANAEROBIC Blood Culture adequate volume   Culture   Final    NO GROWTH 2 DAYS Performed at [facility], [address], [place], [place] [address]    Report Status PENDING  Incomplete  Aerobic/Anaerobic Culture (surgical/deep wound)     Status:  None (Preliminary result)   Collection Time: 03/22/18  7:14 PM  Result Value Ref Range Status   Specimen Description   Final    WOUND Performed at [facility], [address] [place], [place] [address]    Special Requests   Final    NONE Performed at [facility], [address], [place], [place] [address]    Gram Stain NO WBC SEEN RARE GRAM POSITIVE COCCI   Final   Culture   Final    CULTURE REINCUBATED FOR BETTER GROWTH Performed at [facility], [place] [address] [place], [place] [address]    Report Status PENDING  Incomplete    IMAGING: Mr Foot Left Wo Contrast  Result Date: 03/23/2018 CLINICAL DATA:  A 47 year old male with left foot ulcer, history of diabetes and neuropathy. Worsening swelling involving the great toe. EXAM: MRI OF THE LEFT FOOT WITHOUT CONTRAST TECHNIQUE: Multiplanar, multisequence MR imaging of the left forefoot was performed. No intravenous contrast was administered. COMPARISON:  Same day radiographs of the foot. FINDINGS: Bones/Joint/Cartilage Subtle cortical irregularity and bone loss along the plantar and medial aspect of the first proximal phalangeal base with more diffuse bone marrow edema of the distal phalanx raise concern for early changes of osteomyelitis. Adjacent plantar subcutaneous tiny fluid collections suspicious for small abscesses are noted, series 7 image 23 and series 8/5 and 9. These measure 12 x 6 mm laterally and 5 x 6 mm medially. Additionally there is mild marrow edema of the fifth metatarsal head and neck associated with a plantar soft tissue ulcer, series 8/28. This also raises concern for early changes of osteomyelitis. No acute fracture nor joint dislocations. Ligaments Negative Muscles and Tendons Generalized diffuse myositis of the plantar extensor muscles of the forefoot without evidence of pyomyositis. Soft tissues Subcutaneous soft tissue edema of the forefoot. Soft tissue ulcerations are identified over the  dorsum of the first distal phalanx. Soft tissue ulceration along the plantar aspect of the forefoot at the level of the fifth metatarsal head. IMPRESSION: 1. Marrow edema of the first distal phalanx and of the fifth metatarsal head and neck associated with soft tissue ulcers concerning for early changes of osteomyelitis. 2. Two tiny plantar subcutaneous fluid collections measuring 12 x 6 mm and 5 x 6 mm at the base of the first distal phalanx raise concern for tiny soft tissue abscesses. Electronically Signed   By: Ashley Royalty M.D.   On: 03/23/2018 21:10   US Venous Img Lower Unilateral Left  Result Date: 03/22/2018 CLINICAL DATA:  Swelling and redness x1 week EXAM: LEFT LOWER EXTREMITY VENOUS DOPPLER ULTRASOUND TECHNIQUE: Gray-scale sonography with compression, as well as color and duplex ultrasound, were  performed to evaluate the deep venous system from the level of the common femoral vein through the popliteal and proximal calf veins. COMPARISON:  None FINDINGS: Normal compressibility of the common femoral, superficial femoral, and popliteal veins, as well as the proximal calf veins. No filling defects to suggest DVT on grayscale or color Doppler imaging. Doppler waveforms show normal direction of venous flow, normal respiratory phasicity and response to augmentation. Survey views of the contralateral common femoral vein are unremarkable. IMPRESSION: No evidence of left lower extremity deep vein thrombosis. Electronically Signed   By: Lucrezia Europe M.D.   On: 03/22/2018 09:43   Dg Foot Complete Left  Result Date: 03/22/2018 CLINICAL DATA:  Diabetic ulcer.  Gangrenous great toe. EXAM: LEFT FOOT - COMPLETE 3+ VIEW COMPARISON:  12/13/2016. FINDINGS: Soft tissue swelling and ulceration left great toe. No clear-cut bony erosive change noted. No evidence of fracture or dislocation. Soft tissue swelling is also noted focally of the lateral aspect of the foot as well. IMPRESSION: Soft tissue swelling and ulceration  left great toe. No clearcut underlying bony erosion or focal acute bony abnormality. 2. Soft tissue focal swelling noted over the lateral aspect of the foot as well. Electronically Signed   By: Marcello Moores  Register   On: 03/22/2018 11:05    Assessment:   Travon Crochet is a 47 y.o. male with poorly controlled DM foot infection complicated by PN with a L great toe chronic ulcer and lateral foot wound with MRI showing early osteomyelitis. Podiatry rec amputation and debridement but pt declines. I agree that surgery would be best option but he wants to try to avoid.  I do not think he needs IV abx at this time as can use oral highly bioavailable agents for prolonged period.   Recommendations Rec Doxy 100 mg BID  and cipro 500 BID x 6 weeks Fu podiatry as otpt If he fails therapy will need surgery. Would benefit from otpt vascular evaluation  Needs much better DM control as well.  Thank you very much for allowing me to participate in the care of this patient. Please call with questions.   Cheral Marker. Ola Spurr, MD

## 2018-03-24 NOTE — Progress Notes (Signed)
Daily Progress Note   Subjective  - * No surgery date entered *  Pt seen this am.     Objective Vitals:   03/23/18 0748 03/23/18 1601 03/23/18 2357 03/24/18 0814  BP: 122/79 (!) 144/87 120/70 (!) 127/93  Pulse: (!) 54 (!) 56 62 (!) 59  Resp: 18 20 16 18   Temp: 98.5 F (36.9 C) 98.4 F (36.9 C) 98.4 F (36.9 C) 98.2 F (36.8 C)  TempSrc: Oral Oral  Oral  SpO2: 97% 100% 94% 100%  Weight:      Height:        Physical Exam: Still fair amount of purulence from left great toe ulceration.  5th metatarsal ulcer is stable without infection or area that probes.  Ulcer is basically healed.  MRI shows obvious infetion to great toe with osteomyelitis of distal toe.  Also area of concern of early osteo to 5th metatarsal.  Laboratory CBC    Component Value Date/Time   WBC 5.6 03/23/2018 0250   HGB 11.7 (L) 03/23/2018 0250   HCT 34.2 (L) 03/23/2018 0250   PLT 243 03/23/2018 0250    BMET    Component Value Date/Time   NA 141 03/23/2018 0250   K 3.4 (L) 03/23/2018 0250   CL 108 03/23/2018 0250   CO2 28 03/23/2018 0250   GLUCOSE 78 03/23/2018 0250   BUN 9 03/23/2018 0250   CREATININE 0.68 03/23/2018 0250   CALCIUM 8.5 (L) 03/23/2018 0250   GFRNONAA >60 03/23/2018 0250   GFRAA >60 03/23/2018 0250    Assessment/Planning: Osteomyelitis great toe DM with neuropatthy    I had a long discussion with the patient regards to the obviously osteomyelitis of the great toe on MRI.My recommendation is surgical intervention and amputation of the distal portion of the left great toe given the amount of purulent drainage and infection.  After further discussion with the patient he has decided against surgery.  He states he does not want to undergo surgical intervention.  I discussed this is against my medical advice.  He wishes to proceed with antibiotics at this time.  Will consult infectious disease for assistance with antibiotic guidance.  Continue with dressing changes.  He should  change dressing at least daily to remove the infection that is draining from the area.  I will see him back in the outpatient clinic in 2 weeks.  Samara Deist A  03/24/2018, 2:56 PM

## 2018-03-24 NOTE — Progress Notes (Signed)
Per financial counselor Apolonio Schneiders she talked to patient yesterday and made him aware that he is not eligible for medicaid. Patient is requesting to speak to her again and Apolonio Schneiders has agreed to call into patient's room today.  Clinical Social Worker (CSW) and Chief Strategy Officer met with patient in his room today. Patient was walking around independently. Patient reported that his plan is to return to his hotel when he leaves [facility] and go back to work at [employer] as soon as possible. Per RN case manager she will look into home health options. Patient reported that he did speak to the counselor today and understands that he does not qualify for medicaid.   [employer], LCSW [phone]

## 2018-03-24 NOTE — Discharge Summary (Signed)
[facility] [place] NAME: Gary Bentley    MR#:  [id number]  DATE OF BIRTH:  11/02/1972  DATE OF ADMISSION:  03/22/2018 ADMITTING PHYSICIAN: Gladstone Lighter, MD  DATE OF DISCHARGE: No discharge date for patient encounter.  PRIMARY CARE PHYSICIAN: Patient, No Pcp Per    ADMISSION DIAGNOSIS:  Gangrene ([place]) [I96] Wound infection [T14.8XXA, L08.9] Ischemia of toe [I99.8]  DISCHARGE DIAGNOSIS:  Active Problems:   Cellulitis   SECONDARY DIAGNOSIS:   Past Medical History:  Diagnosis Date  . Diabetes mellitus without complication [facility])     HOSPITAL COURSE:  *Acute left foot osteomyelitis Noted on MRI of the foot Patient refused surgery Treated in house empirically with vancomycin/Rocephin, seen by infectious disease as well as podiatry, plan is to have the patient treated with p.o. doxycycline/Cipro for 6-week course, patient follow-up as an outpatient for reevaluation/continued management  *Chronic diabetes mellitus type 2 Stable on current management  *Chronic benign essential hypertension Stable on current regiment   DISCHARGE CONDITIONS:  Stable, to follow-up with podiatry/memory care provider/infectious disease as directed  CONSULTS OBTAINED:  Treatment Team:  Samara Deist, DPM Schnier, Dolores Lory, MD Leelah Hanna, Avel Peace, MD Leonel Ramsay, MD  DRUG ALLERGIES:  No Known Allergies  DISCHARGE MEDICATIONS:   Allergies as of 03/24/2018   No Known Allergies     Medication List    STOP taking these medications   insulin detemir 100 UNIT/ML injection Commonly known as:  LEVEMIR   insulin glargine 100 UNIT/ML injection Commonly known as:  LANTUS     TAKE these medications   ciprofloxacin 500 MG tablet Commonly known as:  CIPRO Take 1 tablet (500 mg total) by mouth 2 (two) times daily.   doxycycline 100 MG tablet Commonly known as:  VIBRA-TABS Take 1 tablet (100 mg total) by mouth every 12  (twelve) hours.   insulin aspart protamine- aspart (70-30) 100 UNIT/ML injection Commonly known as:  NOVOLOG MIX 70/30 Inject 0.3 mLs (30 Units total) into the skin 2 (two) times daily with a meal.   lisinopril 10 MG tablet Commonly known as:  PRINIVIL,ZESTRIL Take 1 tablet (10 mg total) by mouth daily.   metFORMIN 1000 MG tablet Commonly known as:  GLUCOPHAGE Take 1 tablet (1,000 mg total) by mouth 2 (two) times daily with a meal. What changed:  medication strength        DISCHARGE INSTRUCTIONS:  If you experience worsening of your admission symptoms, develop shortness of breath, life threatening emergency, suicidal or homicidal thoughts you must seek medical attention immediately by calling 911 or calling your MD immediately  if symptoms less severe.  You Must read complete instructions/literature along with all the possible adverse reactions/side effects for all the Medicines you take and that have been prescribed to you. Take any new Medicines after you have completely understood and accept all the possible adverse reactions/side effects.   Please note  You were cared for by a hospitalist during your hospital stay. If you have any questions about your discharge medications or the care you received while you were in the hospital after you are discharged, you can call the unit and asked to speak with the hospitalist on call if the hospitalist that took care of you is not available. Once you are discharged, your primary care physician will handle any further medical issues. Please note that NO REFILLS for any discharge medications will be authorized once you are discharged, as it is imperative that  you return to your primary care physician (or establish a relationship with a primary care physician if you do not have one) for your aftercare needs so that they can reassess your need for medications and monitor your lab values.    Today   CHIEF COMPLAINT:   Chief Complaint  Patient  presents with  . Foot Swelling    HISTORY OF PRESENT ILLNESS:  47 y.o. male with a known history of uncontrolled diabetes mellitus not on any medication at this time, chronic calluses of both feet presents to hospital secondary to left foot swelling and open callus with purulent discharge. Patient is not a great historian.  He was admitted to an outside hospital in February for pneumonia and was started on Levemir 30 units twice a day for uncontrolled diabetes.  He was not taking any medications prior to that.  He was discharged on Levemir and metformin.  However patient mentions that he has not been taking any medications for more than a month now.  His sugars are in the 400s.  He has had chronic calluses on both feet going on for years.  But for the last week, noted some swelling of his left foot with pain on walking.  Denies any neuropathy at baseline.  He noticed an open callus on the plantar surface of left first toe with purulent discharge.  Denies any fevers, no nausea or vomiting.  Occasional chills.  Also his first 2 toes on the left foot are slightly dusky in appearance, has a weakly palpable dorsalis pedis pulse.  VITAL SIGNS:  Blood pressure (!) 127/93, pulse (!) 59, temperature 98.2 F (36.8 C), temperature source Oral, resp. rate 18, height 6\' 6"  (1.981 m), weight 97.5 kg (215 lb), SpO2 100 %.  I/O:    Intake/Output Summary (Last 24 hours) at 03/24/2018 1428 Last data filed at 03/24/2018 1300 Gross per 24 hour  Intake 1380 ml  Output -  Net 1380 ml    PHYSICAL EXAMINATION:  GENERAL:  47 y.o.-year-old patient lying in the bed with no acute distress.  EYES: Pupils equal, round, reactive to light and accommodation. No scleral icterus. Extraocular muscles intact.  HEENT: Head atraumatic, normocephalic. Oropharynx and nasopharynx clear.  NECK:  Supple, no jugular venous distention. No thyroid enlargement, no tenderness.  LUNGS: Normal breath sounds bilaterally, no wheezing,  rales,rhonchi or crepitation. No use of accessory muscles of respiration.  CARDIOVASCULAR: S1, S2 normal. No murmurs, rubs, or gallops.  ABDOMEN: Soft, non-tender, non-distended. Bowel sounds present. No organomegaly or mass.  EXTREMITIES: No pedal edema, cyanosis, or clubbing.  NEUROLOGIC: Cranial nerves II through XII are intact. Muscle strength 5/5 in all extremities. Sensation intact. Gait not checked.  PSYCHIATRIC: The patient is alert and oriented x 3.  SKIN: No obvious rash, lesion, or ulcer.   DATA REVIEW:   CBC Recent Labs  Lab 03/23/18 0250  WBC 5.6  HGB 11.7*  HCT 34.2*  PLT 243    Chemistries  Recent Labs  Lab 03/22/18 0901 03/23/18 0250  NA 134* 141  K 4.4 3.4*  CL 100* 108  CO2 27 28  GLUCOSE 429* 78  BUN 10 9  CREATININE 0.90 0.68  CALCIUM 8.5* 8.5*  AST 15  --   ALT 10*  --   ALKPHOS 91  --   BILITOT 0.7  --     Cardiac Enzymes No results for input(s): TROPONINI in the last 168 hours.  Microbiology Results  Results for orders placed or performed during the  hospital encounter of 03/22/18  Blood culture (routine x 2)     Status: None (Preliminary result)   Collection Time: 03/22/18 10:25 AM  Result Value Ref Range Status   Specimen Description BLOOD L AC  Final   Special Requests   Final    BOTTLES DRAWN AEROBIC AND ANAEROBIC Blood Culture adequate volume   Culture   Final    NO GROWTH 2 DAYS Performed at [facility], [address], [place], [place] [address]    Report Status PENDING  Incomplete  Blood culture (routine x 2)     Status: None (Preliminary result)   Collection Time: 03/22/18 10:25 AM  Result Value Ref Range Status   Specimen Description BLOOD RT ARM  Final   Special Requests   Final    BOTTLES DRAWN AEROBIC AND ANAEROBIC Blood Culture adequate volume   Culture   Final    NO GROWTH 2 DAYS Performed at [facility], [address], [place], [place] [address]    Report Status PENDING  Incomplete   Aerobic/Anaerobic Culture (surgical/deep wound)     Status: None (Preliminary result)   Collection Time: 03/22/18  7:14 PM  Result Value Ref Range Status   Specimen Description   Final    WOUND Performed at [facility], [address] [place], [place] [address]    Special Requests   Final    NONE Performed at [facility], [place], [place] [address]    Gram Stain NO WBC SEEN RARE GRAM POSITIVE COCCI   Final   Culture   Final    CULTURE REINCUBATED FOR BETTER GROWTH Performed at [facility], [place] [address], [place], [place] [address]    Report Status PENDING  Incomplete    RADIOLOGY:  Mr Foot Left Wo Contrast  Result Date: 03/23/2018 CLINICAL DATA:  A 47 year old male with left foot ulcer, history of diabetes and neuropathy. Worsening swelling involving the great toe. EXAM: MRI OF THE LEFT FOOT WITHOUT CONTRAST TECHNIQUE: Multiplanar, multisequence MR imaging of the left forefoot was performed. No intravenous contrast was administered. COMPARISON:  Same day radiographs of the foot. FINDINGS: Bones/Joint/Cartilage Subtle cortical irregularity and bone loss along the plantar and medial aspect of the first proximal phalangeal base with more diffuse bone marrow edema of the distal phalanx raise concern for early changes of osteomyelitis. Adjacent plantar subcutaneous tiny fluid collections suspicious for small abscesses are noted, series 7 image 23 and series 8/5 and 9. These measure 12 x 6 mm laterally and 5 x 6 mm medially. Additionally there is mild marrow edema of the fifth metatarsal head and neck associated with a plantar soft tissue ulcer, series 8/28. This also raises concern for early changes of osteomyelitis. No acute fracture nor joint dislocations. Ligaments Negative Muscles and Tendons Generalized diffuse myositis of the plantar extensor muscles of the forefoot without evidence of pyomyositis. Soft tissues Subcutaneous soft tissue edema of  the forefoot. Soft tissue ulcerations are identified over the dorsum of the first distal phalanx. Soft tissue ulceration along the plantar aspect of the forefoot at the level of the fifth metatarsal head. IMPRESSION: 1. Marrow edema of the first distal phalanx and of the fifth metatarsal head and neck associated with soft tissue ulcers concerning for early changes of osteomyelitis. 2. Two tiny plantar subcutaneous fluid collections measuring 12 x 6 mm and 5 x 6 mm at the base of the first distal phalanx raise concern for tiny soft tissue  abscesses. Electronically Signed   By: Ashley Royalty M.D.   On: 03/23/2018 21:10   [place] Ekg Site Rite  Result Date: 03/24/2018 If Site Rite image not attached, placement could not be confirmed due to current cardiac rhythm.   EKG:  No orders found for this or any previous visit.    Management plans discussed with the patient, family and they are in agreement.  CODE STATUS:     Code Status Orders  (From admission, onward)        Start     Ordered   03/22/18 1301  Full code  Continuous     03/22/18 1300    Code Status History    Date Active Date Inactive Code Status Order ID Comments User Context   09/04/2017 1340 09/05/2017 1617 Full Code [id number]  Idelle Crouch, MD ED      TOTAL TIME TAKING CARE OF THIS PATIENT: 45 minutes.    Avel Peace Rakeen Gaillard M.D on 03/24/2018 at 2:28 PM  Between 7am to 6pm - Pager - [phone]  After 6pm go to [email or web address] - password [email or web address] [facility]  Office  [phone]  CC: Primary care physician; Patient, No Pcp Per   Note: This dictation was prepared with Dragon dictation along with smaller phrase technology. Any transcriptional errors that result from this process are unintentional.

## 2018-03-24 NOTE — Progress Notes (Signed)
Arouses easily to name. No c/o at tpresent. Pt gets up to br independently. Call bell in reach.

## 2018-03-24 NOTE — Progress Notes (Signed)
Initial Nutrition Assessment  DOCUMENTATION CODES:   Not applicable  INTERVENTION:   Premier Protein BID, each supplement provides 160 kcal and 30 grams of protein.   Recommend double protein at meals  Recommend Ocuvite daily  Recommend MVI daily  NUTRITION DIAGNOSIS:   Increased nutrient needs related to wound healing as evidenced by increased estimated needs.  GOAL:   Patient will meet greater than or equal to 90% of their needs   MONITOR:   PO intake, Supplement acceptance, Labs, I & O's, Skin, Weight trends  REASON FOR ASSESSMENT:   Malnutrition Screening Tool   ASSESSMENT:  47 y.o. male with a known history of uncontrolled diabetes mellitus not on any medication at this time, chronic calluses of both feet presents to hospital secondary to left foot swelling and open callus with purulent discharge. 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  Per chart, pt has been eating nearly 100% of all meals since admit. Pt reports good appetite and po intake currently and PTA. He reports still being hungry in between meals in hospital. Recommend double protein at meals and premier protein BID.   Pt reports feeling like he's lost weight but does not know his UBW. He think he's lost ~20 lbs in the past couple of months d/t his diabetes. Unable to confirm this in patient's chart. Was able to confirm pt had 21 lb (9%) wt loss from Nov 2018-Feb 2019 and this is significant per time frame. Suspect patient does have significant weight loss.   Dietetic Intern provided "Nutrition and Type II Diabetes" handout from the Academy of Nutrition and Dietetics. Discussed different food groups and their effects on blood sugar, emphasizing carbohydrate-containing foods. Provided list of carbohydrates and recommended serving sizes of common foods. Pt reports eating a lot of chips, cookies, crackers, and sweets. Encouraged patient to be mindful of portion sizes and consume  more nutritionally dense snacks such as fruit, cheese sticks, yogurt. Pt verbalized understanding. Pt states he stopped drinking soda recently. Encouraged this behavior and recommended increased water intake.   Discussed importance of controlled and consistent carbohydrate intake throughout the day. Provided examples of ways to balance meals/snacks and encouraged intake of high-fiber, whole grain complex carbohydrates. Teach back method used.  Expect poor compliance.  Noted patients left foot diabetic ulcer with cellulitis- recommend ocuvite daily to aid with wound healing.  Noted patient's out of range hematology labs. Recommend MVI daily.  Medications reviewed and include: aspirin, ciprofloxacin, colace, doxycycline, enoxaparin, novolog  Labs reviewed: K 3.4(L), RBC 3.39(L), Hemoglobin 11.7(L), HCT 34.2(L), MCV 100.7(H), MCH 34.4(H), glucose 429 to 78 x 24 hrs, Hemoglobin A1C 12.4 (4/30)   NUTRITION - FOCUSED PHYSICAL EXAM:    Most Recent Value  Orbital Region  Mild depletion  Upper Arm Region  Moderate depletion  Thoracic and Lumbar Region  No depletion  Buccal Region  Mild depletion  Temple Region  Moderate depletion  Clavicle Bone Region  Moderate depletion  Clavicle and Acromion Bone Region  Moderate depletion  Scapular Bone Region  No depletion  Dorsal Hand  No depletion  Patellar Region  Moderate depletion  Anterior Thigh Region  Moderate depletion  Posterior Calf Region  Mild depletion  Edema (RD Assessment)  Mild  Hair  Reviewed  Eyes  Reviewed  Mouth  Unable to assess  Skin  Reviewed  Nails  Reviewed       Diet Order:   Diet Order           Diet -  low sodium heart healthy        Diet heart healthy/carb modified Room service appropriate? Yes; Fluid consistency: Thin  Diet effective now          EDUCATION NEEDS:   Education needs have been addressed  Skin:  Skin Assessment: Skin Integrity Issues: Skin Integrity Issues:: Incisions Incisions: left  foot  Last BM:  03/22/18(per nurse- noted pt on colace)  Height:   Ht Readings from Last 1 Encounters:  03/22/18 6\' 6"  (1.981 m)    Weight:   Wt Readings from Last 1 Encounters:  03/22/18 215 lb (97.5 kg)    Ideal Body Weight:  97.3 kg  BMI:  Body mass index is 24.85 kg/m.  Estimated Nutritional Needs:   Kcal:  2400-2800 kcal (MSJ ABW x 1.2-1.4)  Protein:  127-146 gm/day (ABW x 1.3-1.5)  Fluid:  >2.4 L/day (1 mL/kcal)  Alfonse Ras, [place] Dietetic Intern [phone]

## 2018-03-27 LAB — CULTURE, BLOOD (ROUTINE X 2)
CULTURE: NO GROWTH
CULTURE: NO GROWTH
SPECIAL REQUESTS: ADEQUATE
Special Requests: ADEQUATE

## 2018-03-28 LAB — AEROBIC/ANAEROBIC CULTURE W GRAM STAIN (SURGICAL/DEEP WOUND): Gram Stain: NONE SEEN

## 2018-04-11 ENCOUNTER — Ambulatory Visit: Payer: Self-pay | Admitting: Pharmacy Technician

## 2018-04-11 DIAGNOSIS — Z79899 Other long term (current) drug therapy: Secondary | ICD-10-CM

## 2018-04-11 NOTE — Progress Notes (Signed)
Met with patient completed financial assistance application for [place] due to recent hospital visit.  Patient agreed to be responsible for gathering financial information and forwarding to appropriate department in [facility].    Completed [facility] application and contract.  Patient agreed to all terms of the [facility] contract.    Patient approved to receive medication assistance at [facility] until 06/23/18.  Patient will have prescription drug coverage beginning on 06/23/18.Marland Kitchen    Provided patient with Civil engineer, contracting based on his particular needs.    Novolog 70/30 Prescription Application completed with patient.  Forwarded to [facility] for signature.  Upon receipt of signed application from provider, Novolog 70/30 Prescription Application will be submitted to pharmaceutical company.  [facility]

## 2018-05-13 ENCOUNTER — Telehealth: Payer: Self-pay | Admitting: Pharmacist

## 2018-05-13 NOTE — Telephone Encounter (Signed)
05/13/2018 8:49:13 AM - Novolog Mix 70/30 Flexpen & tips  05/13/18 Faxed [employer] application for [employer] 70/30 Flexpen Inject 32 units under the skin twice a day with a meal & Novofine 32G tips use daily with flexpen.Delos Haring

## 2018-05-31 ENCOUNTER — Ambulatory Visit: Payer: Self-pay

## 2018-07-22 ENCOUNTER — Ambulatory Visit: Payer: Self-pay | Admitting: Family Medicine

## 2018-07-22 DIAGNOSIS — E1142 Type 2 diabetes mellitus with diabetic polyneuropathy: Secondary | ICD-10-CM | POA: Diagnosis not present

## 2018-07-22 DIAGNOSIS — D2372 Other benign neoplasm of skin of left lower limb, including hip: Secondary | ICD-10-CM | POA: Diagnosis not present

## 2018-07-22 DIAGNOSIS — L97521 Non-pressure chronic ulcer of other part of left foot limited to breakdown of skin: Secondary | ICD-10-CM | POA: Diagnosis not present

## 2018-07-22 DIAGNOSIS — B351 Tinea unguium: Secondary | ICD-10-CM | POA: Diagnosis not present

## 2018-07-25 ENCOUNTER — Encounter: Payer: Self-pay | Admitting: Emergency Medicine

## 2018-07-25 ENCOUNTER — Emergency Department: Payer: Commercial Managed Care - HMO

## 2018-07-25 ENCOUNTER — Other Ambulatory Visit: Payer: Self-pay

## 2018-07-25 ENCOUNTER — Emergency Department
Admission: EM | Admit: 2018-07-25 | Discharge: 2018-07-26 | Disposition: A | Discharge status: Home / Self Care | Payer: Commercial Managed Care - HMO | Attending: Emergency Medicine | Admitting: Emergency Medicine

## 2018-07-25 DIAGNOSIS — K529 Noninfective gastroenteritis and colitis, unspecified: Secondary | ICD-10-CM

## 2018-07-25 DIAGNOSIS — Z79899 Other long term (current) drug therapy: Secondary | ICD-10-CM | POA: Diagnosis not present

## 2018-07-25 DIAGNOSIS — E119 Type 2 diabetes mellitus without complications: Secondary | ICD-10-CM | POA: Diagnosis not present

## 2018-07-25 DIAGNOSIS — R109 Unspecified abdominal pain: Secondary | ICD-10-CM | POA: Diagnosis not present

## 2018-07-25 DIAGNOSIS — Z794 Long term (current) use of insulin: Secondary | ICD-10-CM | POA: Insufficient documentation

## 2018-07-25 DIAGNOSIS — R197 Diarrhea, unspecified: Secondary | ICD-10-CM | POA: Diagnosis not present

## 2018-07-25 DIAGNOSIS — F17210 Nicotine dependence, cigarettes, uncomplicated: Secondary | ICD-10-CM | POA: Diagnosis not present

## 2018-07-25 DIAGNOSIS — R1084 Generalized abdominal pain: Secondary | ICD-10-CM | POA: Diagnosis present

## 2018-07-25 LAB — URINALYSIS, COMPLETE (UACMP) WITH MICROSCOPIC
BACTERIA UA: NONE SEEN
BILIRUBIN URINE: NEGATIVE
Glucose, UA: NEGATIVE mg/dL
Ketones, ur: 5 mg/dL — AB
LEUKOCYTES UA: NEGATIVE
NITRITE: NEGATIVE
Protein, ur: 30 mg/dL — AB
pH: 6 (ref 5.0–8.0)

## 2018-07-25 LAB — CBC
HEMATOCRIT: 43 % (ref 40.0–52.0)
HEMOGLOBIN: 14.7 g/dL (ref 13.0–18.0)
MCH: 33.9 pg (ref 26.0–34.0)
MCHC: 34.3 g/dL (ref 32.0–36.0)
MCV: 98.7 fL (ref 80.0–100.0)
Platelets: 292 10*3/uL (ref 150–440)
RBC: 4.35 MIL/uL — AB (ref 4.40–5.90)
RDW: 13.7 % (ref 11.5–14.5)
WBC: 11.6 10*3/uL — AB (ref 3.8–10.6)

## 2018-07-25 LAB — COMPREHENSIVE METABOLIC PANEL
ALT: 10 U/L (ref 0–44)
ANION GAP: 7 (ref 5–15)
AST: 15 U/L (ref 15–41)
Albumin: 4 g/dL (ref 3.5–5.0)
Alkaline Phosphatase: 69 U/L (ref 38–126)
BILIRUBIN TOTAL: 1.3 mg/dL — AB (ref 0.3–1.2)
BUN: 9 mg/dL (ref 6–20)
CO2: 26 mmol/L (ref 22–32)
Calcium: 9.2 mg/dL (ref 8.9–10.3)
Chloride: 107 mmol/L (ref 98–111)
Creatinine, Ser: 0.76 mg/dL (ref 0.61–1.24)
Glucose, Bld: 128 mg/dL — ABNORMAL HIGH (ref 70–99)
POTASSIUM: 3 mmol/L — AB (ref 3.5–5.1)
Sodium: 140 mmol/L (ref 135–145)
TOTAL PROTEIN: 7.8 g/dL (ref 6.5–8.1)

## 2018-07-25 LAB — LIPASE, BLOOD: LIPASE: 19 U/L (ref 11–51)

## 2018-07-25 MED ORDER — ONDANSETRON HCL 4 MG/2ML IJ SOLN
4.0000 mg | Freq: Once | INTRAMUSCULAR | Status: AC
  Administered 2018-07-25: 4 mg via INTRAVENOUS
  Filled 2018-07-25: qty 2

## 2018-07-25 MED ORDER — SODIUM CHLORIDE 0.9 % IV BOLUS
1000.0000 mL | Freq: Once | INTRAVENOUS | Status: AC
  Administered 2018-07-25: 1000 mL via INTRAVENOUS

## 2018-07-25 MED ORDER — IOPAMIDOL (ISOVUE-300) INJECTION 61%
100.0000 mL | Freq: Once | INTRAVENOUS | Status: AC | PRN
  Administered 2018-07-25: 100 mL via INTRAVENOUS

## 2018-07-25 NOTE — ED Notes (Signed)
Pt to the ER for lower abd pain that is running all the way across from llq to rlq. Pt says the pain began last night and he has also had diarrhea. Pt states they ordered pizza at work last night and he hasnt been the same since. Pt has increased PO water intake.

## 2018-07-25 NOTE — ED Provider Notes (Signed)
[facility] Emergency Department Provider Note  ____________________________________________   None    (approximate)  I have reviewed the triage vital signs and the nursing notes.   HISTORY  Chief Complaint Abdominal Pain   HPI Gary Bentley is a 47 y.o. male who presents to the emergency department for treatment and evaluation of abdominal pain, diarrhea, nausea, and vomiting. Pain started last night after eating some pizza. No similar symptoms previously. No history of abdominal surgery. Significant PMH of diabetes.   Past Medical History:  Diagnosis Date  . Diabetes mellitus without complication [facility])     Patient Active Problem List   Diagnosis Date Noted  . Cellulitis 03/22/2018  . Osteomyelitis ([place]) 09/04/2017  . 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 Hyperglycemia 09/04/2017  . Foot pain, bilateral 09/04/2017    Past Surgical History:  Procedure Laterality Date  . FOOT SURGERY      Prior to Admission medications   Medication Sig Start Date End Date Taking? Authorizing Provider  ciprofloxacin (CIPRO) 500 MG tablet Take 1 tablet (500 mg total) by mouth 2 (two) times daily. 03/24/18   Salary, Avel Peace, MD  dicyclomine (BENTYL) 20 MG tablet Take 1 tablet (20 mg total) by mouth 3 (three) times daily as needed for spasms. 07/26/18 07/26/19  Genae Strine, Dessa Phi, FNP  doxycycline (VIBRA-TABS) 100 MG tablet Take 1 tablet (100 mg total) by mouth every 12 (twelve) hours. 03/24/18   Salary, Avel Peace, MD  insulin aspart protamine- aspart (NOVOLOG MIX 70/30) (70-30) 100 UNIT/ML injection Inject 0.3 mLs (30 Units total) into the skin 2 (two) times daily with a meal. 03/24/18   Salary, Holly Bodily D, MD  lisinopril (PRINIVIL,ZESTRIL) 10 MG tablet Take 1 tablet (10 mg total) by mouth daily. 03/23/18   Epifanio Lesches, MD  metFORMIN (GLUCOPHAGE) 1000 MG tablet Take 1 tablet (1,000 mg total) by mouth 2 (two) times  daily with a meal. 03/24/18   Salary, Avel Peace, MD  ondansetron (ZOFRAN-ODT) 4 MG disintegrating tablet Take 1 tablet (4 mg total) by mouth every 8 (eight) hours as needed for nausea or vomiting. 07/26/18   Victorino Dike, FNP    Allergies Patient has no known allergies.  Family History  Family history unknown: Yes    Social History Social History   Tobacco Use  . Smoking status: Current Every Day Smoker    Packs/day: 1.00    Types: Cigarettes    Last attempt to quit: 04/17/2017    Years since quitting: 1.2  . Smokeless tobacco: Never Used  Substance Use Topics  . Alcohol use: No  . Drug use: No    Review of Systems  Constitutional: No fever/chills Eyes: No visual changes. ENT: No sore throat. Cardiovascular: Denies chest pain. Respiratory: Denies shortness of breath. Gastrointestinal: No abdominal pain.  No nausea, no vomiting.  No diarrhea.  No constipation. Genitourinary: Negative for dysuria. Musculoskeletal: Negative for back pain. Skin: Negative for rash. Neurological: Negative for headaches, focal weakness or numbness. ____________________________________________   PHYSICAL EXAM:  VITAL SIGNS: ED Triage Vitals [07/25/18 2027]  Enc Vitals Group     BP (!) 141/100     Pulse Rate (!) 115     Resp 20     Temp 99.2 F (37.3 C)     Temp Source Oral     SpO2 97 %     Weight 250 lb (113.4 kg)     Height 6\' 6"  (1.981 m)  Head Circumference      Peak Flow      Pain Score 10     Pain Loc      Pain Edu?      Excl. in [place]?     Constitutional: Alert and oriented. Well appearing and in no acute distress. Eyes: Conjunctivae are normal. PERRL. EOMI. Head: Atraumatic. Nose: No congestion/rhinnorhea. Mouth/Throat: Mucous membranes are moist.  Oropharynx non-erythematous. Neck: No stridor.   Cardiovascular: Normal rate, regular rhythm. Grossly normal heart sounds.  Good peripheral circulation. Respiratory: Normal respiratory effort.  No retractions. Lungs  CTAB. Gastrointestinal: Soft and nontender. No distention. No abdominal bruits. No CVA tenderness. Musculoskeletal: No lower extremity tenderness nor edema.  No joint effusions. Neurologic:  Normal speech and language. No gross focal neurologic deficits are appreciated. No gait instability. Skin:  Skin is warm, dry and intact. No rash noted. Psychiatric: Mood and affect are normal. Speech and behavior are normal.  ____________________________________________   LABS (all labs ordered are listed, but only abnormal results are displayed)  Labs Reviewed  COMPREHENSIVE METABOLIC PANEL - Abnormal; Notable for the following components:      Result Value   Potassium 3.0 (*)    Glucose, Bld 128 (*)    Total Bilirubin 1.3 (*)    All other components within normal limits  CBC - Abnormal; Notable for the following components:   WBC 11.6 (*)    RBC 4.35 (*)    All other components within normal limits  URINALYSIS, COMPLETE (UACMP) WITH MICROSCOPIC - Abnormal; Notable for the following components:   Color, Urine YELLOW (*)    APPearance CLEAR (*)    Specific Gravity, Urine >1.046 (*)    Hgb urine dipstick SMALL (*)    Ketones, ur 5 (*)    Protein, ur 30 (*)    All other components within normal limits  LIPASE, BLOOD   ____________________________________________  EKG  Not indicated. ____________________________________________  RADIOLOGY  ED MD interpretation:  Inflammatory colitis.  Official radiology report(s): Ct Abdomen Pelvis W Contrast  Result Date: 07/25/2018 CLINICAL DATA:  Lower abdominal pain running across from left lower quadrant right lower quadrant starting last evening with diarrhea. EXAM: CT ABDOMEN AND PELVIS WITH CONTRAST TECHNIQUE: Multidetector CT imaging of the abdomen and pelvis was performed using the standard protocol following bolus administration of intravenous contrast. CONTRAST:  140mL ISOVUE-300 IOPAMIDOL (ISOVUE-300) INJECTION 61% COMPARISON:  None.  FINDINGS: Lower chest: Top normal heart size without pericardial effusion. Clear lung bases. Hepatobiliary: No focal liver abnormality is seen. No gallstones, gallbladder wall thickening, or biliary dilatation. Pancreas: Unremarkable. No pancreatic ductal dilatation or surrounding inflammatory changes. Spleen: Normal in size without focal abnormality. Adrenals/Urinary Tract: Adrenal glands are unremarkable. Kidneys are normal, without renal calculi, focal lesion, or hydronephrosis. Bladder is unremarkable. Stomach/Bowel: Moderate transmural inflammatory thickening with pericolonic edema along the descending through rectosigmoid consistent with colitis. Trace fluid along the left paracolic gutter secondary to inflammatory change. No bowel perforation or abscess. The stomach and small intestine are unremarkable. Normal appearing appendix. Vascular/Lymphatic: Mild aortoiliac atherosclerosis.  No adenopathy. 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 No complicating features. Normal size prostate. Other: No free air. Musculoskeletal: No acute nor aggressive osseous abnormalities. 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 IMPRESSION: Transmural thickening of the descending colon through rectosigmoid compatible with colitis likely inflammatory. Adjacent pericolonic edema and fluid is identified along the paracolic gutter. Electronically Signed   By: Ashley Royalty M.D.   On: 07/25/2018 22:41  ____________________________________________   PROCEDURES  Procedure(s) performed: None  Procedures  Critical Care performed: No  ____________________________________________   INITIAL IMPRESSION / ASSESSMENT AND PLAN / ED COURSE  As part of my medical decision making, I reviewed the following data within the electronic MEDICAL RECORD NUMBER Notes from prior ED visits  47 year old male presents to the emergency department for treatment and evaluation of abdominal pain with vomiting and  diarrhea after eating pizza. He was given NS bolus and antiemetics with relief of nausea and vomiting. No diarrhea episodes while here. He will be discharged home with zofran and bentyl and encouraged to stay hydrated. He is to follow up with GI or primary care if symptoms are not improving over the next couple of days. He is to return to the ER for symptoms that change or worsen if unable to schedule an appointment. ____________________________________________   FINAL CLINICAL IMPRESSION(S) / ED DIAGNOSES  Final diagnoses:  Acute colitis     ED Discharge Orders         Ordered    ondansetron (ZOFRAN-ODT) 4 MG disintegrating tablet  Every 8 hours PRN,   Status:  Discontinued     07/26/18 0024    dicyclomine (BENTYL) 20 MG tablet  3 times daily PRN,   Status:  Discontinued     07/26/18 0024    dicyclomine (BENTYL) 20 MG tablet  3 times daily PRN     07/26/18 0108    ondansetron (ZOFRAN-ODT) 4 MG disintegrating tablet  Every 8 hours PRN     07/26/18 0108           Note:  This document was prepared using Dragon voice recognition software and may include unintentional dictation errors.    Victorino Dike, FNP 07/26/18 1528    Nance Pear, MD 07/28/18 1440

## 2018-07-25 NOTE — ED Notes (Signed)
Pt back from CT

## 2018-07-25 NOTE — ED Triage Notes (Signed)
Patient ambulatory to triage with steady gait, without difficulty or distress noted; pt reports lower abd pain since yesterday accomp by diarrhea; denies hx of same

## 2018-07-26 MED ORDER — DICYCLOMINE HCL 20 MG PO TABS: 20.0000 mg | ORAL_TABLET | Freq: Three times a day (TID) | ORAL | 0 refills | Status: DC | PRN

## 2018-07-26 MED ORDER — ONDANSETRON 4 MG PO TBDP: 4.0000 mg | ORAL_TABLET | Freq: Three times a day (TID) | ORAL | 0 refills | Status: DC | PRN

## 2018-07-26 NOTE — Discharge Instructions (Signed)
Please follow up with the GI doctor if not improving over the next couple of days.  Return to the ER for symptoms that change or worsen if unable to schedule an appointment with either primary care or the specialist.

## 2018-07-26 NOTE — ED Notes (Signed)
Patient is resting comfortably. 

## 2018-07-26 NOTE — ED Notes (Signed)
Encouraged pt to urinate. Pt doesn't at this time. Pt to ct

## 2018-08-15 ENCOUNTER — Telehealth: Payer: Self-pay | Admitting: Pharmacy Technician

## 2018-08-15 NOTE — Telephone Encounter (Signed)
Patient has prescription drug coverage with [facility].  No longer meets eligibility criteria for [facility].  Patient notified.  [facility]

## 2018-09-01 ENCOUNTER — Encounter: Payer: Self-pay | Admitting: Gastroenterology

## 2018-09-01 ENCOUNTER — Encounter

## 2018-09-01 ENCOUNTER — Ambulatory Visit: Payer: 59 | Admitting: Gastroenterology

## 2018-09-01 DIAGNOSIS — A09 Infectious gastroenteritis and colitis, unspecified: Secondary | ICD-10-CM

## 2018-09-12 ENCOUNTER — Ambulatory Visit: Payer: Self-pay | Admitting: Family Medicine

## 2018-10-24 ENCOUNTER — Emergency Department
Admission: EM | Admit: 2018-10-24 | Discharge: 2018-10-24 | Disposition: A | Discharge status: Home / Self Care | Payer: Commercial Managed Care - HMO | Attending: Emergency Medicine | Admitting: Emergency Medicine

## 2018-10-24 ENCOUNTER — Emergency Department: Payer: Commercial Managed Care - HMO

## 2018-10-24 ENCOUNTER — Other Ambulatory Visit: Payer: Self-pay

## 2018-10-24 DIAGNOSIS — L84 Corns and callosities: Secondary | ICD-10-CM | POA: Diagnosis not present

## 2018-10-24 DIAGNOSIS — R05 Cough: Secondary | ICD-10-CM | POA: Diagnosis not present

## 2018-10-24 DIAGNOSIS — E119 Type 2 diabetes mellitus without complications: Secondary | ICD-10-CM | POA: Insufficient documentation

## 2018-10-24 DIAGNOSIS — F17210 Nicotine dependence, cigarettes, uncomplicated: Secondary | ICD-10-CM | POA: Insufficient documentation

## 2018-10-24 DIAGNOSIS — Z79899 Other long term (current) drug therapy: Secondary | ICD-10-CM | POA: Insufficient documentation

## 2018-10-24 DIAGNOSIS — J189 Pneumonia, unspecified organism: Secondary | ICD-10-CM | POA: Diagnosis not present

## 2018-10-24 MED ORDER — DOXYCYCLINE HYCLATE 50 MG PO CAPS: 100.0000 mg | ORAL_CAPSULE | Freq: Two times a day (BID) | ORAL | 0 refills | Status: AC

## 2018-10-24 MED ORDER — LIDOCAINE HCL (PF) 1 % IJ SOLN
2.1000 mL | Freq: Once | INTRAMUSCULAR | Status: AC
  Administered 2018-10-24: 2.1 mL
  Filled 2018-10-24: qty 5

## 2018-10-24 MED ORDER — DOXYCYCLINE HYCLATE 50 MG PO CAPS: 100.0000 mg | ORAL_CAPSULE | Freq: Two times a day (BID) | ORAL | 0 refills | Status: DC

## 2018-10-24 MED ORDER — ALBUTEROL SULFATE HFA 108 (90 BASE) MCG/ACT IN AERS: 2.0000 | INHALATION_SPRAY | Freq: Four times a day (QID) | RESPIRATORY_TRACT | 0 refills | Status: DC | PRN

## 2018-10-24 MED ORDER — CEFTRIAXONE SODIUM 1 G IJ SOLR
1.0000 g | Freq: Once | INTRAMUSCULAR | Status: AC
  Administered 2018-10-24: 1 g via INTRAMUSCULAR
  Filled 2018-10-24: qty 10

## 2018-10-24 MED ORDER — AZITHROMYCIN 250 MG PO TABS: ORAL_TABLET | ORAL | 0 refills | Status: DC

## 2018-10-24 MED ORDER — IPRATROPIUM-ALBUTEROL 0.5-2.5 (3) MG/3ML IN SOLN
3.0000 mL | Freq: Once | RESPIRATORY_TRACT | Status: AC
  Administered 2018-10-24: 3 mL via RESPIRATORY_TRACT
  Filled 2018-10-24: qty 3

## 2018-10-24 NOTE — ED Notes (Signed)
Pt ambulatory to POV without difficulty. VSS. NAD. Discharge instructions, RX and follow up reviewed. All questions and concerns addressed.  

## 2018-10-24 NOTE — ED Triage Notes (Signed)
Pt arrives to ED via POV from home with c/o cough x1 week. No c/o N/V/D or fever. Pt states "it feels like I have a lot of phlem in my chest". Pt reports cough has been productive with yellow sputum. Pt also c/o left foot swelling x2 days. Pt states he has an appt with a "foot specialist" on Friday, but would like it checked out while he's here tonight. Pt is a type 2 diabetic. CMS intact in affected foot. Pt is A&O, in NAD; RR even, regular, and unlabored. Pt is ambulatory to Triage with steady gait without difficulty.

## 2018-10-24 NOTE — ED Provider Notes (Signed)
[facility] Emergency Department Provider Note  ____________________________________________  Time seen: Approximately 9:50 PM  I have reviewed the triage vital signs and the nursing notes.   HISTORY  Chief Complaint Cough and Foot Pain    HPI Gary Bentley is a 47 y.o. male that presents to emergency department for evaluation of nasal congestion, productive cough with yellow sputum for 1 week and left great toe pain for 2 days.  Patient states that toe feels swollen and is painful to put pressure on.  No drainage.  He has not checked his temperature but has felt warm.  He smokes 1/2 pack of cigarettes per day.     Past Medical History:  Diagnosis Date  . Diabetes mellitus without complication [facility])     Patient Active Problem List   Diagnosis Date Noted  . Cellulitis 03/22/2018  . Osteomyelitis ([place]) 09/04/2017  . 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 Hyperglycemia 09/04/2017  . Foot pain, bilateral 09/04/2017    Past Surgical History:  Procedure Laterality Date  . FOOT SURGERY      Prior to Admission medications   Medication Sig Start Date End Date Taking? Authorizing Provider  albuterol (PROVENTIL HFA;VENTOLIN HFA) 108 (90 Base) MCG/ACT inhaler Inhale 2 puffs into the lungs every 6 (six) hours as needed for wheezing or shortness of breath. 10/24/18   Laban Emperor, PA-C  ciprofloxacin (CIPRO) 500 MG tablet Take 1 tablet (500 mg total) by mouth 2 (two) times daily. 03/24/18   Salary, Avel Peace, MD  dicyclomine (BENTYL) 20 MG tablet Take 1 tablet (20 mg total) by mouth 3 (three) times daily as needed for spasms. 07/26/18 07/26/19  Triplett, Dessa Phi, FNP  doxycycline (VIBRAMYCIN) 50 MG capsule Take 2 capsules (100 mg total) by mouth 2 (two) times daily for 10 days. 10/24/18 11/03/18  Laban Emperor, PA-C  insulin aspart protamine- aspart (NOVOLOG MIX 70/30) (70-30) 100 UNIT/ML injection Inject 0.3 mLs (30  Units total) into the skin 2 (two) times daily with a meal. 03/24/18   Salary, Holly Bodily D, MD  lisinopril (PRINIVIL,ZESTRIL) 10 MG tablet Take 1 tablet (10 mg total) by mouth daily. 03/23/18   Epifanio Lesches, MD  metFORMIN (GLUCOPHAGE) 1000 MG tablet Take 1 tablet (1,000 mg total) by mouth 2 (two) times daily with a meal. 03/24/18   Salary, Avel Peace, MD  ondansetron (ZOFRAN-ODT) 4 MG disintegrating tablet Take 1 tablet (4 mg total) by mouth every 8 (eight) hours as needed for nausea or vomiting. 07/26/18   Victorino Dike, FNP    Allergies Patient has no known allergies.  Family History  Family history unknown: Yes    Social History Social History   Tobacco Use  . Smoking status: Current Every Day Smoker    Packs/day: 1.00    Types: Cigarettes    Last attempt to quit: 04/17/2017    Years since quitting: 1.5  . Smokeless tobacco: Never Used  Substance Use Topics  . Alcohol use: No  . Drug use: No     Review of Systems  Constitutional: No fever/chills Eyes: No visual changes. No discharge. ENT: Positive for congestion and rhinorrhea. Cardiovascular: No chest pain. Respiratory: Positive for cough. No SOB. Gastrointestinal: No abdominal pain.  No nausea, no vomiting.   Musculoskeletal: Positive for toe pain. Skin: Negative for abrasions, lacerations, ecchymosis.  Positive rash. Neurological: Negative for headaches.   ____________________________________________   PHYSICAL EXAM:  VITAL SIGNS: ED Triage Vitals  Enc Vitals  Group     BP 10/24/18 1954 (!) 142/94     Pulse Rate 10/24/18 1954 96     Resp 10/24/18 1954 17     Temp 10/24/18 1954 98.5 F (36.9 C)     Temp Source 10/24/18 1954 Oral     SpO2 10/24/18 1954 98 %     Weight 10/24/18 1953 250 lb (113.4 kg)     Height 10/24/18 1953 6\' 6"  (1.981 m)     Head Circumference --      Peak Flow --      Pain Score 10/24/18 1952 7     Pain Loc --      Pain Edu? --      Excl. in [place]? --      Constitutional: Alert and  oriented. Well appearing and in no acute distress. Eyes: Conjunctivae are normal. PERRL. EOMI. No discharge. Head: Atraumatic. ENT: No frontal and maxillary sinus tenderness.      Ears: Tympanic membranes pearly gray with good landmarks. No discharge.      Nose: Mild congestion/rhinnorhea.      Mouth/Throat: Mucous membranes are moist. Oropharynx non-erythematous. Tonsils not enlarged. No exudates. Uvula midline. Neck: No stridor.   Hematological/Lymphatic/Immunilogical: No cervical lymphadenopathy. Cardiovascular: Normal rate, regular rhythm.  Good peripheral circulation. Respiratory: Normal respiratory effort without tachypnea or retractions. Lungs CTAB. Good air entry to the bases with no decreased or absent breath sounds. Gastrointestinal: Bowel sounds 4 quadrants. Soft and nontender to palpation. No guarding or rigidity. No palpable masses. No distention. Musculoskeletal: Full range of motion to all extremities. No gross deformities appreciated. Neurologic:  Normal speech and language. No gross focal neurologic deficits are appreciated.  Skin:  Skin is warm, dry.  Significant dry calloused skin to plantar bilateral toes at the base of foot.  No purulent drainage.  No malodor.  No erythema or visible swelling. Psychiatric: Mood and affect are normal. Speech and behavior are normal. Patient exhibits appropriate insight and judgement.   ____________________________________________   LABS (all labs ordered are listed, but only abnormal results are displayed)  Labs Reviewed - No data to display ____________________________________________  EKG   ____________________________________________  RADIOLOGY Robinette Haines, personally viewed and evaluated these images (plain radiographs) as part of my medical decision making, as well as reviewing the written report by the radiologist.  Dg Chest 2 View  Result Date: 10/24/2018 CLINICAL DATA:  Productive cough EXAM: CHEST - 2 VIEW  COMPARISON:  None. FINDINGS: Interstitial prominence and peribronchial thickening throughout the lungs. Mild cardiomegaly. No effusions or acute bony abnormality. IMPRESSION: Peribronchial thickening and interstitial prominence could reflect bronchitis or atypical pneumonia. Electronically Signed   By: Rolm Baptise M.D.   On: 10/24/2018 20:35    ____________________________________________    PROCEDURES  Procedure(s) performed:    Procedures    Medications  ipratropium-albuterol (DUONEB) 0.5-2.5 (3) MG/3ML nebulizer solution 3 mL (3 mLs Nebulization Given 10/24/18 2212)  cefTRIAXone (ROCEPHIN) injection 1 g (1 g Intramuscular Given 10/24/18 2212)  lidocaine (PF) (XYLOCAINE) 1 % injection 2.1 mL (2.1 mLs Other Given 10/24/18 2212)     ____________________________________________   INITIAL IMPRESSION / ASSESSMENT AND PLAN / ED COURSE  Pertinent labs & imaging results that were available during my care of the patient were reviewed by me and considered in my medical decision making (see chart for details).  Review of the [place] CSRS was performed in accordance of the [place] prior to dispensing any controlled drugs.     Patient's diagnosis is  consistent with atypical pneumonia. Vital signs and exam are reassuring.  Chest x-ray bronchitis or atypical pneumonia.  Patient feels better after DuoNeb.  IM clindamycin was given.  No indication of foot infection.  He has follow-up with Dr. Vickki Muff on Friday.  Patient feels comfortable going home. Patient will be discharged home with prescriptions for doxycycline, albuterol inhaler. Patient is to follow up with primary care as needed or otherwise directed. Patient is given ED precautions to return to the ED for any worsening or new symptoms.     ____________________________________________  FINAL CLINICAL IMPRESSION(S) / ED DIAGNOSES  Final diagnoses:  Atypical pneumonia  Callus of foot      NEW MEDICATIONS STARTED DURING THIS VISIT:  ED  Discharge Orders         Ordered    azithromycin (ZITHROMAX Z-PAK) 250 MG tablet  Status:  Discontinued     10/24/18 2313    albuterol (PROVENTIL HFA;VENTOLIN HFA) 108 (90 Base) MCG/ACT inhaler  Every 6 hours PRN,   Status:  Discontinued     10/24/18 2313    doxycycline (VIBRAMYCIN) 50 MG capsule  2 times daily,   Status:  Discontinued     10/24/18 2314    albuterol (PROVENTIL HFA;VENTOLIN HFA) 108 (90 Base) MCG/ACT inhaler  Every 6 hours PRN     10/24/18 2314    doxycycline (VIBRAMYCIN) 50 MG capsule  2 times daily,   Status:  Discontinued     10/24/18 2316    doxycycline (VIBRAMYCIN) 50 MG capsule  2 times daily     10/24/18 2317              This chart was dictated using voice recognition software/Dragon. Despite best efforts to proofread, errors can occur which can change the meaning. Any change was purely unintentional.    Laban Emperor, PA-C 10/25/18 [place], [place], MD 10/25/18 [phone]

## 2018-11-01 DIAGNOSIS — L97511 Non-pressure chronic ulcer of other part of right foot limited to breakdown of skin: Secondary | ICD-10-CM | POA: Diagnosis not present

## 2018-11-01 DIAGNOSIS — M79671 Pain in right foot: Secondary | ICD-10-CM | POA: Diagnosis not present

## 2018-11-01 DIAGNOSIS — E1142 Type 2 diabetes mellitus with diabetic polyneuropathy: Secondary | ICD-10-CM | POA: Diagnosis not present

## 2018-11-01 DIAGNOSIS — M79672 Pain in left foot: Secondary | ICD-10-CM | POA: Diagnosis not present

## 2018-11-01 DIAGNOSIS — L97521 Non-pressure chronic ulcer of other part of left foot limited to breakdown of skin: Secondary | ICD-10-CM | POA: Diagnosis not present

## 2018-11-18 DIAGNOSIS — Z0001 Encounter for general adult medical examination with abnormal findings: Secondary | ICD-10-CM | POA: Diagnosis not present

## 2018-11-18 DIAGNOSIS — Z794 Long term (current) use of insulin: Secondary | ICD-10-CM | POA: Diagnosis not present

## 2018-11-18 DIAGNOSIS — E119 Type 2 diabetes mellitus without complications: Secondary | ICD-10-CM | POA: Diagnosis not present

## 2018-11-18 DIAGNOSIS — J189 Pneumonia, unspecified organism: Secondary | ICD-10-CM | POA: Diagnosis not present

## 2018-11-25 ENCOUNTER — Emergency Department: Payer: 59

## 2018-11-25 ENCOUNTER — Other Ambulatory Visit: Payer: Self-pay

## 2018-11-25 ENCOUNTER — Emergency Department
Admission: EM | Admit: 2018-11-25 | Discharge: 2018-11-25 | Disposition: A | Discharge status: Home / Self Care | Payer: 59 | Attending: Emergency Medicine | Admitting: Emergency Medicine

## 2018-11-25 ENCOUNTER — Encounter: Payer: Self-pay | Admitting: Emergency Medicine

## 2018-11-25 DIAGNOSIS — R0602 Shortness of breath: Secondary | ICD-10-CM | POA: Diagnosis not present

## 2018-11-25 DIAGNOSIS — Z794 Long term (current) use of insulin: Secondary | ICD-10-CM | POA: Insufficient documentation

## 2018-11-25 DIAGNOSIS — I444 Left anterior fascicular block: Secondary | ICD-10-CM | POA: Diagnosis not present

## 2018-11-25 DIAGNOSIS — E119 Type 2 diabetes mellitus without complications: Secondary | ICD-10-CM | POA: Insufficient documentation

## 2018-11-25 DIAGNOSIS — R1110 Vomiting, unspecified: Secondary | ICD-10-CM | POA: Diagnosis not present

## 2018-11-25 DIAGNOSIS — Z87891 Personal history of nicotine dependence: Secondary | ICD-10-CM | POA: Diagnosis not present

## 2018-11-25 DIAGNOSIS — R109 Unspecified abdominal pain: Secondary | ICD-10-CM | POA: Diagnosis not present

## 2018-11-25 DIAGNOSIS — Z79899 Other long term (current) drug therapy: Secondary | ICD-10-CM | POA: Diagnosis not present

## 2018-11-25 DIAGNOSIS — R0600 Dyspnea, unspecified: Secondary | ICD-10-CM | POA: Diagnosis not present

## 2018-11-25 LAB — COMPREHENSIVE METABOLIC PANEL
ALBUMIN: 3.5 g/dL (ref 3.5–5.0)
ALT: 24 U/L (ref 0–44)
AST: 16 U/L (ref 15–41)
Alkaline Phosphatase: 62 U/L (ref 38–126)
Anion gap: 7 (ref 5–15)
BUN: 10 mg/dL (ref 6–20)
CHLORIDE: 107 mmol/L (ref 98–111)
CO2: 23 mmol/L (ref 22–32)
CREATININE: 0.78 mg/dL (ref 0.61–1.24)
Calcium: 8.4 mg/dL — ABNORMAL LOW (ref 8.9–10.3)
GFR calc Af Amer: >60 mL/min (ref >60)
GFR calc non Af Amer: >60 mL/min (ref >60)
GLUCOSE: 166 mg/dL — AB (ref 70–99)
Potassium: 3.7 mmol/L (ref 3.5–5.1)
SODIUM: 137 mmol/L (ref 135–145)
Total Bilirubin: 1.4 mg/dL — ABNORMAL HIGH (ref 0.3–1.2)
Total Protein: 6.9 g/dL (ref 6.5–8.1)

## 2018-11-25 LAB — DIFFERENTIAL
Abs Immature Granulocytes: 0.01 10*3/uL (ref 0.00–0.07)
BASOS ABS: 0.1 10*3/uL (ref 0.0–0.1)
BASOS PCT: 1 %
EOS ABS: 0 10*3/uL (ref 0.0–0.5)
EOS PCT: 0 %
IMMATURE GRANULOCYTES: 0 %
LYMPHS PCT: 14 %
Lymphs Abs: 0.7 10*3/uL (ref 0.7–4.0)
MONO ABS: 1 10*3/uL (ref 0.1–1.0)
Monocytes Relative: 20 %
NEUTROS PCT: 65 %
Neutro Abs: 3.2 10*3/uL (ref 1.7–7.7)

## 2018-11-25 LAB — URINALYSIS, COMPLETE (UACMP) WITH MICROSCOPIC
BACTERIA UA: NONE SEEN
BILIRUBIN URINE: NEGATIVE
Glucose, UA: NEGATIVE mg/dL
Hgb urine dipstick: NEGATIVE
Ketones, ur: NEGATIVE mg/dL
Leukocytes, UA: NEGATIVE
Nitrite: NEGATIVE
PH: 6 (ref 5.0–8.0)
Protein, ur: 100 mg/dL — AB
SQUAMOUS EPITHELIAL / LPF: NONE SEEN (ref 0–5)
Specific Gravity, Urine: 1.023 (ref 1.005–1.030)

## 2018-11-25 LAB — CBC
HEMATOCRIT: 39.6 % (ref 39.0–52.0)
Hemoglobin: 13 g/dL (ref 13.0–17.0)
MCH: 32.3 pg (ref 26.0–34.0)
MCHC: 32.8 g/dL (ref 30.0–36.0)
MCV: 98.3 fL (ref 80.0–100.0)
PLATELETS: 258 10*3/uL (ref 150–400)
RBC: 4.03 MIL/uL — AB (ref 4.22–5.81)
RDW: 12.2 % (ref 11.5–15.5)
WBC: 5 10*3/uL (ref 4.0–10.5)
nRBC: 0 % (ref 0.0–0.2)

## 2018-11-25 LAB — LIPASE, BLOOD: Lipase: 18 U/L (ref 11–51)

## 2018-11-25 LAB — TROPONIN I

## 2018-11-25 MED ORDER — SODIUM CHLORIDE 0.9 % IV BOLUS
1000.0000 mL | Freq: Once | INTRAVENOUS | Status: AC
  Administered 2018-11-25: 1000 mL via INTRAVENOUS

## 2018-11-25 MED ORDER — LEVOFLOXACIN 500 MG PO TABS
500.0000 mg | ORAL_TABLET | Freq: Once | ORAL | Status: AC
  Administered 2018-11-25: 500 mg via ORAL

## 2018-11-25 MED ORDER — LEVOFLOXACIN 500 MG PO TABS: 500.0000 mg | ORAL_TABLET | Freq: Every day | ORAL | 0 refills | Status: AC

## 2018-11-25 MED ORDER — IOHEXOL 300 MG/ML  SOLN
30.0000 mL | Freq: Once | INTRAMUSCULAR | Status: AC | PRN
  Administered 2018-11-25: 30 mL via ORAL

## 2018-11-25 MED ORDER — IOHEXOL 300 MG/ML  SOLN
100.0000 mL | Freq: Once | INTRAMUSCULAR | Status: AC | PRN
  Administered 2018-11-25: 100 mL via INTRAVENOUS

## 2018-11-25 MED ORDER — ONDANSETRON HCL 4 MG/2ML IJ SOLN
4.0000 mg | Freq: Once | INTRAMUSCULAR | Status: AC
  Administered 2018-11-25: 4 mg via INTRAVENOUS
  Filled 2018-11-25: qty 2

## 2018-11-25 MED ORDER — ONDANSETRON 4 MG PO TBDP: 4.0000 mg | ORAL_TABLET | Freq: Three times a day (TID) | ORAL | 0 refills | Status: DC | PRN

## 2018-11-25 NOTE — ED Triage Notes (Signed)
Pt to ED from home c/o abd pain, denies urinary symptoms, n/v/d, nausea x4 today, SOB with productive yellow cough.  Pt chest rise even and unlabored in triage, skin warm and dry, in NAD at this time.

## 2018-11-25 NOTE — Discharge Instructions (Addendum)
The test we did here look okay.  There was a suggestion of a pneumonia on the chest x-ray but the bottom of the lungs are clear on the CT scan.  CT scan looks like new may be developing diarrhea but otherwise looks normal.  I will give you some Levaquin.  1 pill a day the first dose here in the ER.  Please return here if you feel worse.  This includes feeling more short of breath having worse belly pain being unable to keep down fluids or any other problems.  I will also give you some Zofran melt on your tongue wafers for the nausea.  That is 1 wafer 3 times a day.  Again please return for any further problems

## 2018-11-25 NOTE — ED Notes (Signed)
Returned from CT.

## 2018-11-25 NOTE — ED Notes (Signed)
Patient transported to CT 

## 2018-11-25 NOTE — ED Provider Notes (Addendum)
[facility] Emergency Department Provider Note   ____________________________________________   First MD Initiated Contact with Patient 11/25/18 1018     (approximate)  I have reviewed the triage vital signs and the nursing notes.   HISTORY  Chief Complaint Abdominal Pain and Shortness of Breath    HPI Gary Bentley is a 48 y.o. male complains of abdominal pain and distention.  He reports this made worse by deep breathing.  Not so much that he is short of breath is the pain is worse when he takes a deep breath.  He is coughing up some yellow phlegm however.  He is not having any fever.  He is vomiting but not having any diarrhea.  Past Medical History:  Diagnosis Date  . Diabetes mellitus without complication [facility])     Patient Active Problem List   Diagnosis Date Noted  . Cellulitis 03/22/2018  . Osteomyelitis ([place]) 09/04/2017  . 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 Hyperglycemia 09/04/2017  . Foot pain, bilateral 09/04/2017    Past Surgical History:  Procedure Laterality Date  . FOOT SURGERY      Prior to Admission medications   Medication Sig Start Date End Date Taking? Authorizing Provider  albuterol (PROVENTIL HFA;VENTOLIN HFA) 108 (90 Base) MCG/ACT inhaler Inhale 2 puffs into the lungs every 6 (six) hours as needed for wheezing or shortness of breath. 10/24/18   Laban Emperor, PA-C  ciprofloxacin (CIPRO) 500 MG tablet Take 1 tablet (500 mg total) by mouth 2 (two) times daily. 03/24/18   Salary, Avel Peace, MD  dicyclomine (BENTYL) 20 MG tablet Take 1 tablet (20 mg total) by mouth 3 (three) times daily as needed for spasms. 07/26/18 07/26/19  Triplett, Johnette Abraham B, FNP  insulin aspart protamine- aspart (NOVOLOG MIX 70/30) (70-30) 100 UNIT/ML injection Inject 0.3 mLs (30 Units total) into the skin 2 (two) times daily with a meal. 03/24/18   Salary, Avel Peace, MD  levofloxacin (LEVAQUIN) 500 MG tablet  Take 1 tablet (500 mg total) by mouth daily for 10 days. 11/25/18 12/05/18  Nena Polio, MD  lisinopril (PRINIVIL,ZESTRIL) 10 MG tablet Take 1 tablet (10 mg total) by mouth daily. 03/23/18   Epifanio Lesches, MD  metFORMIN (GLUCOPHAGE) 1000 MG tablet Take 1 tablet (1,000 mg total) by mouth 2 (two) times daily with a meal. 03/24/18   Salary, Avel Peace, MD  ondansetron (ZOFRAN ODT) 4 MG disintegrating tablet Take 1 tablet (4 mg total) by mouth every 8 (eight) hours as needed for nausea or vomiting. 11/25/18   Nena Polio, MD  ondansetron (ZOFRAN-ODT) 4 MG disintegrating tablet Take 1 tablet (4 mg total) by mouth every 8 (eight) hours as needed for nausea or vomiting. 07/26/18   Victorino Dike, FNP    Allergies Patient has no known allergies.  Family History  Family history unknown: Yes    Social History Social History   Tobacco Use  . Smoking status: Former Smoker    Packs/day: 1.00    Types: Cigarettes    Last attempt to quit: 04/17/2017    Years since quitting: 1.6  . Smokeless tobacco: Never Used  Substance Use Topics  . Alcohol use: No  . Drug use: No    Review of Systems  Constitutional: No fever/chills Eyes: No visual changes. ENT: No sore throat. Cardiovascular: Denies chest pain. Respiratory: Denies shortness of breath. Gastrointestinal: See HPI Genitourinary: Negative for dysuria. Musculoskeletal: Negative for back pain. Skin: Negative  for rash. Neurological: Negative for headaches, focal weakness  ____________________________________________   PHYSICAL EXAM:  VITAL SIGNS: ED Triage Vitals  Enc Vitals Group     BP 11/25/18 0857 (!) 134/95     Pulse Rate 11/25/18 0857 100     Resp 11/25/18 0857 (!) 24     Temp 11/25/18 0857 98.4 F (36.9 C)     Temp Source 11/25/18 0857 Oral     SpO2 11/25/18 0857 95 %     Weight 11/25/18 0858 260 lb (117.9 kg)     Height 11/25/18 0858 6\' 5"  (1.956 m)     Head Circumference --      Peak Flow --      Pain Score  11/25/18 0903 8     Pain Loc --      Pain Edu? --      Excl. in [place]? --     Constitutional: Alert and oriented. Well appearing and in no acute distress. Eyes: Conjunctivae are normal.  Head: Atraumatic. Nose: No congestion/rhinnorhea. Mouth/Throat: Mucous membranes are moist.  Oropharynx non-erythematous. Neck: No stridor.   Cardiovascular: Normal rate, regular rhythm. Grossly normal heart sounds.  Good peripheral circulation. Respiratory: Normal respiratory effort.  No retractions. Lungs CTAB. Gastrointestinal: Soft mildly diffusely tender slightly distended. No abdominal bruits. No CVA tenderness. Musculoskeletal: No lower extremity tenderness nor edema.  No joint effusions. Neurologic:  Normal speech and language. No gross focal neurologic deficits are appreciated. Skin:  Skin is warm, dry and intact. No rash noted. Psychiatric: Mood and affect are normal. Speech and behavior are normal.  ____________________________________________   LABS (all labs ordered are listed, but only abnormal results are displayed)  Labs Reviewed  COMPREHENSIVE METABOLIC PANEL - Abnormal; Notable for the following components:      Result Value   Glucose, Bld 166 (*)    Calcium 8.4 (*)    Total Bilirubin 1.4 (*)    All other components within normal limits  CBC - Abnormal; Notable for the following components:   RBC 4.03 (*)    All other components within normal limits  URINALYSIS, COMPLETE (UACMP) WITH MICROSCOPIC - Abnormal; Notable for the following components:   Color, Urine YELLOW (*)    APPearance CLEAR (*)    Protein, ur 100 (*)    All other components within normal limits  LIPASE, BLOOD  DIFFERENTIAL  TROPONIN I   ____________________________________________  EKG  EKG read and interpreted by me shows normal sinus rhythm rate of 97 left axis computer is reading left anterior hemiblock as well.  There are ST-T changes laterally only.  I do not have any old EKGs to compare  to. ____________________________________________  RADIOLOGY  ED MD interpretation: Chest x-ray read by radiology as faint infiltrate at the right lung base.  CT read by radiology as no acute abnormality.  I reviewed both the films.  He can see air-fluid levels in the colon consistent with his diarrhea.  Official radiology report(s): Dg Chest 2 View  Result Date: 11/25/2018 CLINICAL DATA:  Shortness of breath. Vomiting. EXAM: CHEST - 2 VIEW COMPARISON:  10/24/2018 FINDINGS: There is new mild cardiomegaly. There is a faint infiltrate at the right lung base. Pulmonary vascularity is normal. No effusions. No bone abnormality. IMPRESSION: 1. New mild cardiomegaly. 2. Faint infiltrate at the right lung base. The possibility of aspiration pneumonitis should be considered. Electronically Signed   By: Lorriane Shire M.D.   On: 11/25/2018 09:38   Ct Abdomen Pelvis W Contrast  Result  Date: 11/25/2018 CLINICAL DATA:  Abdominal pain EXAM: CT ABDOMEN AND PELVIS WITH CONTRAST TECHNIQUE: Multidetector CT imaging of the abdomen and pelvis was performed using the standard protocol following bolus administration of intravenous contrast. CONTRAST:  180mL OMNIPAQUE IOHEXOL 300 MG/ML  SOLN COMPARISON:  07/25/2018 FINDINGS: Lower chest: No acute abnormality. Hepatobiliary: No focal liver abnormality is seen. No gallstones, gallbladder wall thickening, or biliary dilatation. Pancreas: Unremarkable. No pancreatic ductal dilatation or surrounding inflammatory changes. Spleen: Normal in size without focal abnormality. Adrenals/Urinary Tract: Adrenal glands are within normal limits. Kidneys are well visualized bilaterally within normal enhancement pattern and normal excretion of contrast. No renal calculi or obstructive changes are seen. The bladder is partially distended. Stomach/Bowel: Scattered diverticular change of the colon is noted. The previously seen wall thickening within the descending and sigmoid colons has resolved in  the interval. No findings to suggest diverticulitis are noted. The appendix is within normal limits. No small bowel inflammatory changes are seen. Stomach is within normal limits. Vascular/Lymphatic: No significant vascular findings are present. No enlarged abdominal or pelvic lymph nodes. Reproductive: Prostate is unremarkable. 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 Other: No abdominal wall hernia or abnormality. Minimal free fluid is noted within the pelvis stable from the previous exam and likely physiologic in nature. Musculoskeletal: Degenerative changes of the lumbar spine are noted. IMPRESSION: Resolution of previously seen colitis. No acute abnormality noted. Electronically Signed   By: Inez Catalina M.D.   On: 11/25/2018 11:54    ____________________________________________   PROCEDURES  Procedure(s) performed:   Procedures  Critical Care performed:   ____________________________________________   INITIAL IMPRESSION / ASSESSMENT AND PLAN / ED COURSE  ----------------------------------------- 12:49 PM on 11/25/2018 -----------------------------------------  Patient not feeling well is breathing fast does have a wet cough at this point still not having any diarrhea in spite of the air-fluid levels in his colon.  We will try some Zofran little bit of IV fluid and then try p.o. challenge if he does well with that we will try to let him go with some Levaquin and I discussed the side effects with him.  Clinical Course as of Nov 26 1351  Fri Nov 25, 2018  1017 Anion gap: 7 [PM]    Clinical Course User Index [PM] Nena Polio, MD   On discharge patient is feeling better.  He is no longer nauseated.  He is able to keep down fluids.  He still breathing a little bit fast.  ____________________________________________   FINAL CLINICAL IMPRESSION(S) / ED DIAGNOSES  Final diagnoses:  Abdominal pain, unspecified abdominal location  Dyspnea,  unspecified type     ED Discharge Orders         Ordered    ondansetron (ZOFRAN ODT) 4 MG disintegrating tablet  Every 8 hours PRN     11/25/18 1352    levofloxacin (LEVAQUIN) 500 MG tablet  Daily     11/25/18 1352           Note:  This document was prepared using Dragon voice recognition software and may include unintentional dictation errors.    Nena Polio, MD 11/25/18 1354    Nena Polio, MD 12/03/18 1204

## 2018-11-25 NOTE — ED Notes (Signed)
CT notified pt finished with oral contrast 

## 2018-11-25 NOTE — ED Notes (Signed)
First Nurse Note: Patient to Rm 1 ambulatory.  Caryl Pina RN aware of room placement.

## 2018-12-21 ENCOUNTER — Other Ambulatory Visit: Payer: Self-pay

## 2018-12-21 ENCOUNTER — Emergency Department: Payer: 59

## 2018-12-21 ENCOUNTER — Emergency Department
Admission: EM | Admit: 2018-12-21 | Discharge: 2018-12-21 | Disposition: A | Discharge status: Home / Self Care | Payer: 59 | Attending: Emergency Medicine | Admitting: Emergency Medicine

## 2018-12-21 ENCOUNTER — Encounter: Payer: Self-pay | Admitting: Emergency Medicine

## 2018-12-21 DIAGNOSIS — R69 Illness, unspecified: Secondary | ICD-10-CM

## 2018-12-21 DIAGNOSIS — Z87891 Personal history of nicotine dependence: Secondary | ICD-10-CM | POA: Insufficient documentation

## 2018-12-21 DIAGNOSIS — R05 Cough: Secondary | ICD-10-CM | POA: Diagnosis not present

## 2018-12-21 DIAGNOSIS — Z794 Long term (current) use of insulin: Secondary | ICD-10-CM | POA: Diagnosis not present

## 2018-12-21 DIAGNOSIS — J111 Influenza due to unidentified influenza virus with other respiratory manifestations: Secondary | ICD-10-CM | POA: Diagnosis not present

## 2018-12-21 DIAGNOSIS — R51 Headache: Secondary | ICD-10-CM | POA: Diagnosis not present

## 2018-12-21 DIAGNOSIS — R140 Abdominal distension (gaseous): Secondary | ICD-10-CM | POA: Diagnosis not present

## 2018-12-21 DIAGNOSIS — R509 Fever, unspecified: Secondary | ICD-10-CM | POA: Insufficient documentation

## 2018-12-21 DIAGNOSIS — Z79899 Other long term (current) drug therapy: Secondary | ICD-10-CM | POA: Diagnosis not present

## 2018-12-21 DIAGNOSIS — E119 Type 2 diabetes mellitus without complications: Secondary | ICD-10-CM | POA: Diagnosis not present

## 2018-12-21 LAB — INFLUENZA PANEL BY PCR (TYPE A & B)
Influenza A By PCR: NEGATIVE
Influenza B By PCR: NEGATIVE

## 2018-12-21 NOTE — Discharge Instructions (Addendum)
Follow-up with Dr. Alice Reichert.  Please call for an appointment.  For the flulike symptoms take over-the-counter TheraFlu.  Return to the emergency department worsening.

## 2018-12-21 NOTE — ED Provider Notes (Signed)
[facility] Emergency Department Provider Note  ____________________________________________   First MD Initiated Contact with Patient 12/21/18 1750     (approximate)  I have reviewed the triage vital signs and the nursing notes.   HISTORY  Chief Complaint Fever and Generalized Body Aches    HPI Gary Bentley is a 48 y.o. male presents emergency department with flulike symptoms.  He states he has had cough, chills, and headache for about 7 days.  He still is having a low-grade fever.  He denies any vomiting or diarrhea but does state that he feels bloated.  He was seen approximately 1 to 2 weeks ago for abdominal pain in which his CT of the abdomen was negative.  He was given Levaquin and Zofran at that time.    Past Medical History:  Diagnosis Date  . Diabetes mellitus without complication [employer] [facility] [employer])     Patient Active Problem List   Diagnosis Date Noted  . Cellulitis 03/22/2018  . Osteomyelitis ([place]) 09/04/2017  . 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 Hyperglycemia 09/04/2017  . Foot pain, bilateral 09/04/2017    Past Surgical History:  Procedure Laterality Date  . FOOT SURGERY      Prior to Admission medications   Medication Sig Start Date End Date Taking? Authorizing Provider  albuterol (PROVENTIL HFA;VENTOLIN HFA) 108 (90 Base) MCG/ACT inhaler Inhale 2 puffs into the lungs every 6 (six) hours as needed for wheezing or shortness of breath. 10/24/18   Laban Emperor, PA-C  insulin aspart protamine- aspart (NOVOLOG MIX 70/30) (70-30) 100 UNIT/ML injection Inject 0.3 mLs (30 Units total) into the skin 2 (two) times daily with a meal. 03/24/18   Salary, Holly Bodily D, MD  lisinopril (PRINIVIL,ZESTRIL) 10 MG tablet Take 1 tablet (10 mg total) by mouth daily. 03/23/18   Epifanio Lesches, MD  metFORMIN (GLUCOPHAGE) 1000 MG tablet Take 1 tablet (1,000 mg total) by mouth 2 (two) times daily with a meal. 03/24/18    Salary, Avel Peace, MD    Allergies Patient has no known allergies.  Family History  Family history unknown: Yes    Social History Social History   Tobacco Use  . Smoking status: Former Smoker    Packs/day: 1.00    Types: Cigarettes    Last attempt to quit: 04/17/2017    Years since quitting: 1.6  . Smokeless tobacco: Never Used  Substance Use Topics  . Alcohol use: No  . Drug use: No    Review of Systems  Constitutional: Positive fever/chills Eyes: No visual changes. ENT: No sore throat. Respiratory: Positive cough Genitourinary: Negative for dysuria. Musculoskeletal: Negative for back pain. Skin: Negative for rash.    ____________________________________________   PHYSICAL EXAM:  VITAL SIGNS: ED Triage Vitals  Enc Vitals Group     BP 12/21/18 1744 (!) 144/106     Pulse Rate 12/21/18 1744 (!) 102     Resp 12/21/18 1744 18     Temp 12/21/18 1744 99 F (37.2 C)     Temp Source 12/21/18 1744 Oral     SpO2 12/21/18 1744 99 %     Weight 12/21/18 1745 255 lb (115.7 kg)     Height 12/21/18 1745 6\' 5"  (1.956 m)     Head Circumference --      Peak Flow --      Pain Score 12/21/18 1745 8     Pain Loc --      Pain Edu? --  Excl. in [place]? --     Constitutional: Alert and oriented. Well appearing and in no acute distress.  Talkative, asking for remote and a blanket. Eyes: Conjunctivae are normal.  Head: Atraumatic. Nose: No congestion/rhinnorhea. Mouth/Throat: Mucous membranes are moist.  Throat is normal Neck:  supple no lymphadenopathy noted Cardiovascular: Normal rate, regular rhythm. Heart sounds are normal Respiratory: Normal respiratory effort.  No retractions, lungs c t a  Abd: soft nontender bs normal all 4 quad GU: deferred Musculoskeletal: FROM all extremities, warm and well perfused Neurologic:  Normal speech and language.  Skin:  Skin is warm, dry and intact. No rash noted. Psychiatric: Mood and affect are normal. Speech and behavior are  normal.  ____________________________________________   LABS (all labs ordered are listed, but only abnormal results are displayed)  Labs Reviewed  INFLUENZA PANEL BY PCR (TYPE A & B)   ____________________________________________   ____________________________________________  RADIOLOGY  Chest x-ray is negative  ____________________________________________   PROCEDURES  Procedure(s) performed: No  Procedures    ____________________________________________   INITIAL IMPRESSION / ASSESSMENT AND PLAN / ED COURSE  Pertinent labs & imaging results that were available during my care of the patient were reviewed by me and considered in my medical decision making (see chart for details).   Patient is 48 year old male presents emergency department flulike symptoms.  Physical exam is basically unremarkable.  Influenza test is negative Chest x-ray is negative  Explained to the patient this is a viral illness.  He is to take over-the-counter TheraFlu, Tylenol/ibuprofen.  Drink plenty of fluids.  He is to follow-up with GI for his chronic abdominal pain.  He states he understands will comply.  Is given a work note as requested.  Is discharged in stable condition.     As part of my medical decision making, I reviewed the following data within the [place] notes reviewed and incorporated, labs, flu test is negative ; old chart reviewed, Radiograph reviewed chest x-ray negative, Notes from prior ED visits and  Controlled Substance Database  ____________________________________________   FINAL CLINICAL IMPRESSION(S) / ED DIAGNOSES  Final diagnoses:  Influenza-like illness      NEW MEDICATIONS STARTED DURING THIS VISIT:  Current Discharge Medication List       Note:  This document was prepared using Dragon voice recognition software and may include unintentional dictation errors.    Versie Starks, PA-C 12/21/18 2055      Schaevitz, Randall An, MD 12/21/18 [phone]

## 2018-12-21 NOTE — ED Triage Notes (Signed)
Pt in via POV, reports cough, chills, headache x approximately one week.  Pt with low grade fever upon arrival.  NAD noted at this time.

## 2018-12-23 ENCOUNTER — Emergency Department
Admission: EM | Admit: 2018-12-23 | Discharge: 2018-12-23 | Disposition: A | Discharge status: Home / Self Care | Payer: 59 | Attending: Emergency Medicine | Admitting: Emergency Medicine

## 2018-12-23 ENCOUNTER — Other Ambulatory Visit: Payer: Self-pay

## 2018-12-23 ENCOUNTER — Emergency Department: Payer: 59

## 2018-12-23 ENCOUNTER — Encounter: Payer: Self-pay | Admitting: Intensive Care

## 2018-12-23 DIAGNOSIS — R1032 Left lower quadrant pain: Secondary | ICD-10-CM | POA: Diagnosis not present

## 2018-12-23 DIAGNOSIS — R197 Diarrhea, unspecified: Secondary | ICD-10-CM | POA: Diagnosis not present

## 2018-12-23 DIAGNOSIS — Z79899 Other long term (current) drug therapy: Secondary | ICD-10-CM | POA: Diagnosis not present

## 2018-12-23 DIAGNOSIS — Z794 Long term (current) use of insulin: Secondary | ICD-10-CM | POA: Insufficient documentation

## 2018-12-23 DIAGNOSIS — Z87891 Personal history of nicotine dependence: Secondary | ICD-10-CM | POA: Insufficient documentation

## 2018-12-23 DIAGNOSIS — J189 Pneumonia, unspecified organism: Secondary | ICD-10-CM

## 2018-12-23 DIAGNOSIS — R1031 Right lower quadrant pain: Secondary | ICD-10-CM | POA: Diagnosis not present

## 2018-12-23 DIAGNOSIS — R1084 Generalized abdominal pain: Secondary | ICD-10-CM | POA: Diagnosis present

## 2018-12-23 DIAGNOSIS — E119 Type 2 diabetes mellitus without complications: Secondary | ICD-10-CM | POA: Diagnosis not present

## 2018-12-23 DIAGNOSIS — R05 Cough: Secondary | ICD-10-CM | POA: Diagnosis not present

## 2018-12-23 DIAGNOSIS — R1110 Vomiting, unspecified: Secondary | ICD-10-CM | POA: Diagnosis not present

## 2018-12-23 DIAGNOSIS — R109 Unspecified abdominal pain: Secondary | ICD-10-CM

## 2018-12-23 DIAGNOSIS — R0602 Shortness of breath: Secondary | ICD-10-CM | POA: Diagnosis not present

## 2018-12-23 DIAGNOSIS — K529 Noninfective gastroenteritis and colitis, unspecified: Secondary | ICD-10-CM

## 2018-12-23 LAB — COMPREHENSIVE METABOLIC PANEL
ALT: 16 U/L (ref 0–44)
AST: 14 U/L — ABNORMAL LOW (ref 15–41)
Albumin: 3.6 g/dL (ref 3.5–5.0)
Alkaline Phosphatase: 58 U/L (ref 38–126)
Anion gap: 5 (ref 5–15)
BUN: 6 mg/dL (ref 6–20)
CO2: 25 mmol/L (ref 22–32)
CREATININE: 0.98 mg/dL (ref 0.61–1.24)
Calcium: 8.5 mg/dL — ABNORMAL LOW (ref 8.9–10.3)
Chloride: 107 mmol/L (ref 98–111)
GFR calc Af Amer: >60 mL/min (ref >60)
GFR calc non Af Amer: >60 mL/min (ref >60)
Glucose, Bld: 122 mg/dL — ABNORMAL HIGH (ref 70–99)
Potassium: 3.5 mmol/L (ref 3.5–5.1)
Sodium: 137 mmol/L (ref 135–145)
Total Bilirubin: 1.2 mg/dL (ref 0.3–1.2)
Total Protein: 6.9 g/dL (ref 6.5–8.1)

## 2018-12-23 LAB — CBC
HCT: 40.5 % (ref 39.0–52.0)
Hemoglobin: 13 g/dL (ref 13.0–17.0)
MCH: 31.6 pg (ref 26.0–34.0)
MCHC: 32.1 g/dL (ref 30.0–36.0)
MCV: 98.5 fL (ref 80.0–100.0)
NRBC: 0 % (ref 0.0–0.2)
Platelets: 283 10*3/uL (ref 150–400)
RBC: 4.11 MIL/uL — ABNORMAL LOW (ref 4.22–5.81)
RDW: 12.4 % (ref 11.5–15.5)
WBC: 6.6 10*3/uL (ref 4.0–10.5)

## 2018-12-23 LAB — LIPASE, BLOOD: Lipase: 19 U/L (ref 11–51)

## 2018-12-23 LAB — TROPONIN I: Troponin I: <0.03 ng/mL (ref <0.03)

## 2018-12-23 MED ORDER — AMOXICILLIN-POT CLAVULANATE 875-125 MG PO TABS: 1.0000 | ORAL_TABLET | Freq: Two times a day (BID) | ORAL | 0 refills | Status: AC

## 2018-12-23 MED ORDER — BENZONATATE 100 MG PO CAPS: 100.0000 mg | ORAL_CAPSULE | Freq: Four times a day (QID) | ORAL | 0 refills | Status: DC | PRN

## 2018-12-23 MED ORDER — FENTANYL CITRATE (PF) 100 MCG/2ML IJ SOLN
50.0000 ug | Freq: Once | INTRAMUSCULAR | Status: AC
  Administered 2018-12-23: 50 ug via INTRAVENOUS
  Filled 2018-12-23: qty 2

## 2018-12-23 MED ORDER — OXYCODONE-ACETAMINOPHEN 5-325 MG PO TABS: 1.0000 | ORAL_TABLET | ORAL | 0 refills | Status: DC | PRN

## 2018-12-23 MED ORDER — IOPAMIDOL (ISOVUE-300) INJECTION 61%
100.0000 mL | Freq: Once | INTRAVENOUS | Status: AC | PRN
  Administered 2018-12-23: 100 mL via INTRAVENOUS

## 2018-12-23 MED ORDER — IOPAMIDOL (ISOVUE-300) INJECTION 61%
30.0000 mL | Freq: Once | INTRAVENOUS | Status: AC | PRN
  Administered 2018-12-23: 30 mL via ORAL

## 2018-12-23 MED ORDER — LEVOFLOXACIN IN D5W 500 MG/100ML IV SOLN
500.0000 mg | Freq: Once | INTRAVENOUS | Status: DC
  Administered 2018-12-23: 500 mg via INTRAVENOUS
  Filled 2018-12-23: qty 100

## 2018-12-23 MED ORDER — ONDANSETRON HCL 4 MG PO TABS: 4.0000 mg | ORAL_TABLET | Freq: Three times a day (TID) | ORAL | 0 refills | Status: DC | PRN

## 2018-12-23 MED ORDER — SODIUM CHLORIDE 0.9 % IV SOLN
1.0000 g | Freq: Once | INTRAVENOUS | Status: AC
  Administered 2018-12-23: 1 g via INTRAVENOUS
  Filled 2018-12-23: qty 10

## 2018-12-23 MED ORDER — SODIUM CHLORIDE 0.9 % IV BOLUS: 1000.0000 mL | Freq: Once | INTRAVENOUS | Status: DC

## 2018-12-23 MED ORDER — ONDANSETRON HCL 4 MG/2ML IJ SOLN
4.0000 mg | Freq: Once | INTRAMUSCULAR | Status: AC
  Administered 2018-12-23: 4 mg via INTRAVENOUS
  Filled 2018-12-23: qty 2

## 2018-12-23 NOTE — ED Notes (Signed)
CT was notified that pt has finished oral contrast.

## 2018-12-23 NOTE — ED Triage Notes (Signed)
Patient c/o abd pain X1 week with N/V/D

## 2018-12-23 NOTE — ED Provider Notes (Addendum)
[facility] Emergency Department Provider Note  ____________________________________________   I have reviewed the triage vital signs and the nursing notes. Where available I have reviewed prior notes and, if possible and indicated, outside hospital notes.    HISTORY  Chief Complaint Abdominal Pain    HPI Gary Bentley is a 48 y.o. male with a history of diabetes mellitus, hyperglycemia, recurrent visits to the emergency room, was seen here yesterday for URI symptoms diarrhea and vomiting, presents today for the same complaints.  He has not had any fever that he knows of.  His flu is negative yesterday blood work was reassuring yesterday, he was appropriately diagnosed with a viral illness and discharged.  Patient returns today stating that he has ongoing abdominal pain, ongoing diarrhea, vomiting vomiting ongoing cough and he does not feel better yet.  He denies any hematemesis melena or bright red blood per rectum.  He denies any dysuria or urinary frequency.  The pain is "all over" his lower abdomen.  He has no chest pain.  Cough is productive, no other alleviating or aggravating symptoms no other complaints   Past Medical History:  Diagnosis Date  . Diabetes mellitus without complication [facility])     Patient Active Problem List   Diagnosis Date Noted  . Cellulitis 03/22/2018  . Osteomyelitis ([place]) 09/04/2017  . Diabetes mellitus due to underlying condition with foot ulcer (CODE) ([place]) 09/04/2017  . Hyperglycemia 09/04/2017  . Foot pain, bilateral 09/04/2017    Past Surgical History:  Procedure Laterality Date  . FOOT SURGERY      Prior to Admission medications   Medication Sig Start Date End Date Taking? Authorizing Provider  albuterol (PROVENTIL HFA;VENTOLIN HFA) 108 (90 Base) MCG/ACT inhaler Inhale 2 puffs into the lungs every 6 (six) hours as needed for wheezing or shortness of breath. 10/24/18   Laban Emperor, PA-C  insulin aspart protamine-  aspart (NOVOLOG MIX 70/30) (70-30) 100 UNIT/ML injection Inject 0.3 mLs (30 Units total) into the skin 2 (two) times daily with a meal. 03/24/18   Salary, Holly Bodily D, MD  lisinopril (PRINIVIL,ZESTRIL) 10 MG tablet Take 1 tablet (10 mg total) by mouth daily. 03/23/18   Epifanio Lesches, MD  metFORMIN (GLUCOPHAGE) 1000 MG tablet Take 1 tablet (1,000 mg total) by mouth 2 (two) times daily with a meal. 03/24/18   Salary, Avel Peace, MD    Allergies Patient has no known allergies.  Family History  Family history unknown: Yes    Social History Social History   Tobacco Use  . Smoking status: Former Smoker    Packs/day: 1.00    Types: Cigarettes    Last attempt to quit: 04/17/2017    Years since quitting: 1.6  . Smokeless tobacco: Never Used  Substance Use Topics  . Alcohol use: No  . Drug use: No    Review of Systems Constitutional: No fever/chills Eyes: No visual changes. ENT: No sore throat. No stiff neck no neck pain Cardiovascular: Denies chest pain. Respiratory: Denies shortness of breath. Gastrointestinal:   no vomiting.  No diarrhea.  No constipation. Genitourinary: Negative for dysuria. Musculoskeletal: Negative lower extremity swelling Skin: Negative for rash. Neurological: Negative for severe headaches, focal weakness or numbness.   ____________________________________________   PHYSICAL EXAM:  VITAL SIGNS: ED Triage Vitals  Enc Vitals Group     BP 12/23/18 1528 (!) 128/99     Pulse Rate 12/23/18 1528 (!) 102     Resp 12/23/18 1528 20     Temp 12/23/18 1528  98.3 F (36.8 C)     Temp Source 12/23/18 1528 Oral     SpO2 12/23/18 1528 100 %     Weight 12/23/18 1532 255 lb (115.7 kg)     Height 12/23/18 1532 6\' 5"  (1.956 m)     Head Circumference --      Peak Flow --      Pain Score 12/23/18 1532 8     Pain Loc --      Pain Edu? --      Excl. in [place]? --     Constitutional: Alert and oriented. Well appearing and in no acute distress. Eyes: Conjunctivae are  normal Head: Atraumatic HEENT: No congestion/rhinnorhea. Mucous membranes are moist.  Oropharynx non-erythematous Neck:   Nontender with no meningismus, no masses, no stridor Cardiovascular: Normal rate, regular rhythm. Grossly normal heart sounds.  Good peripheral circulation. Respiratory: Normal respiratory effort.  No retractions. Lungs CTAB. Abdominal: Soft and tenderness noted, nonsurgical abdomen and is to palpation mostly in the right and left lower quadrants.,. No distention. No guarding no rebound Back:  There is no focal tenderness or step off.  there is no midline tenderness there are no lesions noted. there is no CVA tenderness Musculoskeletal: No lower extremity tenderness, no upper extremity tenderness. No joint effusions, no DVT signs strong distal pulses no edema Neurologic:  Normal speech and language. No gross focal neurologic deficits are appreciated.  Skin:  Skin is warm, dry and intact. No rash noted. Psychiatric: Mood and affect are normal. Speech and behavior are normal.  ____________________________________________   LABS (all labs ordered are listed, but only abnormal results are displayed)  Labs Reviewed  COMPREHENSIVE METABOLIC PANEL - Abnormal; Notable for the following components:      Result Value   Glucose, Bld 122 (*)    Calcium 8.5 (*)    AST 14 (*)    All other components within normal limits  CBC - Abnormal; Notable for the following components:   RBC 4.11 (*)    All other components within normal limits  LIPASE, BLOOD  URINALYSIS, COMPLETE (UACMP) WITH MICROSCOPIC  TROPONIN I    Pertinent labs  results that were available during my care of the patient were reviewed by me and considered in my medical decision making (see chart for details). ____________________________________________  EKG  I personally interpreted any EKGs ordered by me or triage Sinus rhythm rate 93 bpm no acute ST elevation or depression nonspecific ST changes, LAD noted,  QTC is 510 ____________________________________________  RADIOLOGY  Pertinent labs & imaging results that were available during my care of the patient were reviewed by me and considered in my medical decision making (see chart for details). If possible, patient and/or family made aware of any abnormal findings.  Dg Chest 2 View  Result Date: 12/23/2018 CLINICAL DATA:  Cough pain for 1 week, nausea, vomiting and diarrhea. Shortness of breath and cough. History of diabetes. EXAM: CHEST - 2 VIEW COMPARISON:  Chest radiograph December 21, 2018 FINDINGS: Stable cardiomegaly. Mediastinal silhouette is unremarkable. Mild chronic interstitial changes of pleural effusion or focal consolidation. No pneumothorax. Soft tissue planes and included osseous structures are non suspicious. IMPRESSION: 1. Stable cardiomegaly and chronic interstitial changes. Electronically Signed   By: Elon Alas M.D.   On: 12/23/2018 18:22   Ct Abdomen Pelvis W Contrast  Result Date: 12/23/2018 CLINICAL DATA:  Abdominal pain for 1 week, nausea, vomiting, diarrhea, history diabetes mellitus, former smoker EXAM: CT ABDOMEN AND PELVIS WITH CONTRAST TECHNIQUE:  Multidetector CT imaging of the abdomen and pelvis was performed using the standard protocol following bolus administration of intravenous contrast. Sagittal and coronal MPR images reconstructed from axial data set. CONTRAST:  146mL ISOVUE-300 IOPAMIDOL (ISOVUE-300) INJECTION 61% IV COMPARISON:  11/25/2018 FINDINGS: Lower chest: LEFT lower lobe infiltrate. Subsegmental atelectasis RIGHT middle lobe base. Enlargement of cardiac chambers. Hepatobiliary: Gallbladder and liver normal appearance Pancreas: Normal appearance Spleen: Normal appearance Adrenals/Urinary Tract: Thickening of adrenal glands without discrete mass. Kidneys, ureters, and bladder normal appearance Stomach/Bowel: Normal appendix. Diffuse wall thickening of the colon from the distal ascending colon into proximal  sigmoid colon compatible with colitis. Extensive pericolic infiltrative changes and edema. Cecum and proximal ascending colon as well as distal sigmoid colon and rectum are normal in wall thickness without inflammatory changes. Few sigmoid diverticula are identified. Small bowel loops and stomach are normal in appearance. Vascular/Lymphatic: Minimal atherosclerotic calcifications aorta and iliac arteries. No adenopathy. 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 Unremarkable prostate gland and seminal vesicles Other: Small amount of free fluid in pelvis. Small RIGHT inguinal hernia containing fat. No free air. Musculoskeletal: Osseous structures unremarkable. Diffusely bulging versus broad-based posterior disc herniation L4-L5 flattening thecal sac. IMPRESSION: Long segment of colitis with extensive pericolic inflammatory changes extending from distal ascending colon into proximal sigmoid colon; differential diagnosis would include infection and inflammatory bowel disease. No evidence of perforation or abscess. Enlargement of cardiac chambers. Small amount of nonspecific free pelvic fluid. LEFT inguinal hernia containing fat. LEFT lower lobe infiltrate question pneumonia. Electronically Signed   By: Lavonia Dana M.D.   On: 12/23/2018 18:03   ____________________________________________    PROCEDURES  Procedure(s) performed: None  Procedures  Critical Care performed: None  ____________________________________________   INITIAL IMPRESSION / ASSESSMENT AND PLAN / ED COURSE  Pertinent labs & imaging results that were available during my care of the patient were reviewed by me and considered in my medical decision making (see chart for details).  Here with cough, and abdominal pain diarrhea and vomiting.  Very consistent with viral illness however given that he is returning and he feels that his pain is worse, and he is diabetic I did a CT scan.  CT scan  shows a left lower lobe pneumonia or which is likely what he feels about.  He also has some degree of inflammatory changes in his abdomen without any surgical process.  That the patient has long QT, we elected to give a different antibiotic to him.   ____________________________________________   FINAL CLINICAL IMPRESSION(S) / ED DIAGNOSES  Final diagnoses:  None      This chart was dictated using voice recognition software.  Despite best efforts to proofread,  errors can occur which can change meaning.      Schuyler Amor, MD 12/23/18 1847    Schuyler Amor, MD 12/23/18 Lurline Hare

## 2019-01-09 ENCOUNTER — Emergency Department: Payer: 59

## 2019-01-09 ENCOUNTER — Other Ambulatory Visit: Payer: Self-pay

## 2019-01-09 ENCOUNTER — Encounter: Payer: Self-pay | Admitting: Emergency Medicine

## 2019-01-09 ENCOUNTER — Inpatient Hospital Stay
Admission: EM | Admit: 2019-01-09 | Discharge: 2019-01-15 | DRG: 286 | Disposition: A | Discharge status: Home / Self Care | Payer: 59 | Attending: Internal Medicine | Admitting: Internal Medicine

## 2019-01-09 DIAGNOSIS — Q256 Stenosis of pulmonary artery: Secondary | ICD-10-CM

## 2019-01-09 DIAGNOSIS — J111 Influenza due to unidentified influenza virus with other respiratory manifestations: Secondary | ICD-10-CM | POA: Diagnosis not present

## 2019-01-09 DIAGNOSIS — R0789 Other chest pain: Secondary | ICD-10-CM | POA: Diagnosis not present

## 2019-01-09 DIAGNOSIS — Z87891 Personal history of nicotine dependence: Secondary | ICD-10-CM | POA: Diagnosis not present

## 2019-01-09 DIAGNOSIS — I420 Dilated cardiomyopathy: Secondary | ICD-10-CM | POA: Diagnosis present

## 2019-01-09 DIAGNOSIS — I248 Other forms of acute ischemic heart disease: Secondary | ICD-10-CM | POA: Diagnosis present

## 2019-01-09 DIAGNOSIS — R0902 Hypoxemia: Secondary | ICD-10-CM | POA: Diagnosis present

## 2019-01-09 DIAGNOSIS — R05 Cough: Secondary | ICD-10-CM | POA: Diagnosis not present

## 2019-01-09 DIAGNOSIS — I272 Pulmonary hypertension, unspecified: Secondary | ICD-10-CM | POA: Diagnosis present

## 2019-01-09 DIAGNOSIS — I5023 Acute on chronic systolic (congestive) heart failure: Secondary | ICD-10-CM | POA: Diagnosis present

## 2019-01-09 DIAGNOSIS — R0602 Shortness of breath: Secondary | ICD-10-CM | POA: Diagnosis not present

## 2019-01-09 DIAGNOSIS — Z8701 Personal history of pneumonia (recurrent): Secondary | ICD-10-CM | POA: Diagnosis not present

## 2019-01-09 DIAGNOSIS — J101 Influenza due to other identified influenza virus with other respiratory manifestations: Secondary | ICD-10-CM | POA: Diagnosis present

## 2019-01-09 DIAGNOSIS — I5021 Acute systolic (congestive) heart failure: Secondary | ICD-10-CM | POA: Diagnosis not present

## 2019-01-09 DIAGNOSIS — R17 Unspecified jaundice: Secondary | ICD-10-CM | POA: Diagnosis present

## 2019-01-09 DIAGNOSIS — J1000 Influenza due to other identified influenza virus with unspecified type of pneumonia: Secondary | ICD-10-CM | POA: Diagnosis present

## 2019-01-09 DIAGNOSIS — I110 Hypertensive heart disease with heart failure: Principal | ICD-10-CM | POA: Diagnosis present

## 2019-01-09 DIAGNOSIS — R7989 Other specified abnormal findings of blood chemistry: Secondary | ICD-10-CM

## 2019-01-09 DIAGNOSIS — E11649 Type 2 diabetes mellitus with hypoglycemia without coma: Secondary | ICD-10-CM | POA: Diagnosis present

## 2019-01-09 DIAGNOSIS — E119 Type 2 diabetes mellitus without complications: Secondary | ICD-10-CM | POA: Diagnosis not present

## 2019-01-09 DIAGNOSIS — R790 Abnormal level of blood mineral: Secondary | ICD-10-CM | POA: Diagnosis not present

## 2019-01-09 DIAGNOSIS — K529 Noninfective gastroenteritis and colitis, unspecified: Secondary | ICD-10-CM | POA: Diagnosis present

## 2019-01-09 DIAGNOSIS — R809 Proteinuria, unspecified: Secondary | ICD-10-CM | POA: Diagnosis present

## 2019-01-09 DIAGNOSIS — I509 Heart failure, unspecified: Secondary | ICD-10-CM | POA: Diagnosis not present

## 2019-01-09 LAB — URINE DRUG SCREEN, QUALITATIVE (ARMC ONLY)
Amphetamines, Ur Screen: NOT DETECTED
Barbiturates, Ur Screen: NOT DETECTED
Benzodiazepine, Ur Scrn: NOT DETECTED
Cannabinoid 50 Ng, Ur ~~LOC~~: NOT DETECTED
Cocaine Metabolite,Ur ~~LOC~~: NOT DETECTED
MDMA (Ecstasy)Ur Screen: NOT DETECTED
Methadone Scn, Ur: NOT DETECTED
OPIATE, UR SCREEN: NOT DETECTED
PHENCYCLIDINE (PCP) UR S: NOT DETECTED
Tricyclic, Ur Screen: NOT DETECTED

## 2019-01-09 LAB — URINALYSIS, ROUTINE W REFLEX MICROSCOPIC
Bilirubin Urine: NEGATIVE
GLUCOSE, UA: NEGATIVE mg/dL
Ketones, ur: 5 mg/dL — AB
Leukocytes,Ua: NEGATIVE
Nitrite: NEGATIVE
Protein, ur: 100 mg/dL — AB
Specific Gravity, Urine: 1.029 (ref 1.005–1.030)
pH: 5 (ref 5.0–8.0)

## 2019-01-09 LAB — CBC
HCT: 41.1 % (ref 39.0–52.0)
Hemoglobin: 13 g/dL (ref 13.0–17.0)
MCH: 31.4 pg (ref 26.0–34.0)
MCHC: 31.6 g/dL (ref 30.0–36.0)
MCV: 99.3 fL (ref 80.0–100.0)
Platelets: 287 10*3/uL (ref 150–400)
RBC: 4.14 MIL/uL — ABNORMAL LOW (ref 4.22–5.81)
RDW: 13 % (ref 11.5–15.5)
WBC: 4 10*3/uL (ref 4.0–10.5)
nRBC: 0 % (ref 0.0–0.2)

## 2019-01-09 LAB — COMPREHENSIVE METABOLIC PANEL
ALT: 19 U/L (ref 0–44)
AST: 29 U/L (ref 15–41)
Albumin: 3.5 g/dL (ref 3.5–5.0)
Alkaline Phosphatase: 61 U/L (ref 38–126)
Anion gap: 6 (ref 5–15)
BUN: 13 mg/dL (ref 6–20)
CO2: 24 mmol/L (ref 22–32)
Calcium: 8.5 mg/dL — ABNORMAL LOW (ref 8.9–10.3)
Chloride: 108 mmol/L (ref 98–111)
Creatinine, Ser: 1.18 mg/dL (ref 0.61–1.24)
GFR calc Af Amer: >60 mL/min (ref >60)
GFR calc non Af Amer: >60 mL/min (ref >60)
Glucose, Bld: 69 mg/dL — ABNORMAL LOW (ref 70–99)
Potassium: 3.8 mmol/L (ref 3.5–5.1)
Sodium: 138 mmol/L (ref 135–145)
Total Bilirubin: 1.5 mg/dL — ABNORMAL HIGH (ref 0.3–1.2)
Total Protein: 7.1 g/dL (ref 6.5–8.1)

## 2019-01-09 LAB — LACTIC ACID, PLASMA: Lactic Acid, Venous: 1.8 mmol/L (ref 0.5–1.9)

## 2019-01-09 LAB — INFLUENZA PANEL BY PCR (TYPE A & B)
Influenza A By PCR: NEGATIVE
Influenza B By PCR: POSITIVE — AB

## 2019-01-09 LAB — TROPONIN I: Troponin I: 1.05 ng/mL (ref <0.03)

## 2019-01-09 LAB — BRAIN NATRIURETIC PEPTIDE: B Natriuretic Peptide: 1169 pg/mL — ABNORMAL HIGH (ref 0.0–100.0)

## 2019-01-09 LAB — MAGNESIUM: Magnesium: 1.7 mg/dL (ref 1.7–2.4)

## 2019-01-09 LAB — PHOSPHORUS: Phosphorus: 3.1 mg/dL (ref 2.5–4.6)

## 2019-01-09 MED ORDER — IPRATROPIUM-ALBUTEROL 0.5-2.5 (3) MG/3ML IN SOLN
3.0000 mL | Freq: Once | RESPIRATORY_TRACT | Status: AC
  Administered 2019-01-09: 3 mL via RESPIRATORY_TRACT
  Filled 2019-01-09: qty 3

## 2019-01-09 MED ORDER — VANCOMYCIN HCL 10 G IV SOLR
2000.0000 mg | Freq: Once | INTRAVENOUS | Status: AC
  Administered 2019-01-09: 2000 mg via INTRAVENOUS
  Filled 2019-01-09: qty 2000

## 2019-01-09 MED ORDER — FUROSEMIDE 10 MG/ML IJ SOLN
20.0000 mg | Freq: Once | INTRAMUSCULAR | Status: AC
  Administered 2019-01-09: 20 mg via INTRAVENOUS
  Filled 2019-01-09: qty 4

## 2019-01-09 MED ORDER — DEXTROSE 50 % IV SOLN: 1.0000 | INTRAVENOUS | Status: DC | PRN

## 2019-01-09 MED ORDER — ACETAMINOPHEN 325 MG PO TABS
650.0000 mg | ORAL_TABLET | Freq: Once | ORAL | Status: AC
  Administered 2019-01-09: 650 mg via ORAL
  Filled 2019-01-09: qty 2

## 2019-01-09 MED ORDER — OSELTAMIVIR PHOSPHATE 75 MG PO CAPS
75.0000 mg | ORAL_CAPSULE | Freq: Two times a day (BID) | ORAL | Status: DC
  Administered 2019-01-10 – 2019-01-15 (×10): 75 mg via ORAL
  Filled 2019-01-09 (×11): qty 1

## 2019-01-09 MED ORDER — LEVOFLOXACIN 500 MG PO TABS
500.0000 mg | ORAL_TABLET | Freq: Once | ORAL | Status: AC
  Administered 2019-01-09: 500 mg via ORAL
  Filled 2019-01-09: qty 1

## 2019-01-09 MED ORDER — MORPHINE SULFATE (PF) 4 MG/ML IV SOLN: 4.0000 mg | INTRAVENOUS | Status: DC | PRN

## 2019-01-09 MED ORDER — MORPHINE SULFATE (PF) 2 MG/ML IV SOLN: 2.0000 mg | INTRAVENOUS | Status: DC | PRN

## 2019-01-09 MED ORDER — NITROGLYCERIN 0.4 MG SL SUBL: 0.4000 mg | SUBLINGUAL_TABLET | SUBLINGUAL | Status: DC | PRN

## 2019-01-09 MED ORDER — OSELTAMIVIR PHOSPHATE 75 MG PO CAPS
75.0000 mg | ORAL_CAPSULE | Freq: Once | ORAL | Status: AC
  Administered 2019-01-09: 75 mg via ORAL
  Filled 2019-01-09: qty 1

## 2019-01-09 MED ORDER — INSULIN GLARGINE 100 UNIT/ML ~~LOC~~ SOLN
20.0000 [IU] | Freq: Two times a day (BID) | SUBCUTANEOUS | Status: DC
  Filled 2019-01-09 (×2): qty 0.2

## 2019-01-09 NOTE — ED Provider Notes (Signed)
[facility] Emergency Department Provider Note  ____________________________________________  Time seen: Approximately 11:22 PM  I have reviewed the triage vital signs and the nursing notes.   HISTORY  Chief Complaint Cough and Generalized Body Aches    HPI Gary Bentley is a 48 y.o. male with past medical history of diabetes that presents emergency department for evaluation of subjective fever, body aches, cough, SOB, bilateral foot pain and swelling for 3 days.  Patient has been seen in the emergency department multiple times over the last 2 months for URI symptoms.  No vomiting, diarrhea.   Past Medical History:  Diagnosis Date  . Diabetes mellitus without complication [facility])     Patient Active Problem List   Diagnosis Date Noted  . Influenza B 01/09/2019  . Cellulitis 03/22/2018  . Osteomyelitis ([place]) 09/04/2017  . Diabetes mellitus due to underlying condition with foot ulcer (CODE) ([place]) 09/04/2017  . Hyperglycemia 09/04/2017  . Foot pain, bilateral 09/04/2017    Past Surgical History:  Procedure Laterality Date  . FOOT SURGERY      Prior to Admission medications   Medication Sig Start Date End Date Taking? Authorizing Provider  albuterol (PROVENTIL HFA;VENTOLIN HFA) 108 (90 Base) MCG/ACT inhaler Inhale 2 puffs into the lungs every 6 (six) hours as needed for wheezing or shortness of breath. 10/24/18  Yes Laban Emperor, PA-C  benzonatate (TESSALON PERLES) 100 MG capsule Take 1 capsule (100 mg total) by mouth every 6 (six) hours as needed for cough. 12/23/18 12/23/19 Yes McShane, Gerda Diss, MD  insulin aspart protamine - aspart (NOVOLOG MIX 70/30 FLEXPEN) (70-30) 100 UNIT/ML FlexPen Inject 30 Units into the skin 2 (two) times daily.   Yes [provider]  lisinopril (PRINIVIL,ZESTRIL) 10 MG tablet Take 1 tablet (10 mg total) by mouth daily. 03/23/18  Yes Epifanio Lesches, MD  metFORMIN (GLUCOPHAGE) 1000 MG tablet Take 1 tablet (1,000 mg  total) by mouth 2 (two) times daily with a meal. 03/24/18  Yes Salary, Montell D, MD  ondansetron (ZOFRAN) 4 MG tablet Take 1 tablet (4 mg total) by mouth every 8 (eight) hours as needed for nausea or vomiting. 12/23/18  Yes McShane, Gerda Diss, MD  oxyCODONE-acetaminophen (PERCOCET) 5-325 MG tablet Take 1 tablet by mouth every 4 (four) hours as needed for severe pain. 12/23/18  Yes Schuyler Amor, MD    Allergies Patient has no known allergies.  Family History  Family history unknown: Yes    Social History Social History   Tobacco Use  . Smoking status: Former Smoker    Packs/day: 1.00    Types: Cigarettes    Last attempt to quit: 04/17/2017    Years since quitting: 1.7  . Smokeless tobacco: Never Used  Substance Use Topics  . Alcohol use: No  . Drug use: No     Review of Systems  Constitutional: Positive for fever. Eyes: No visual changes. No discharge. ENT: Negative for congestion and rhinorrhea. Cardiovascular: No chest pain. Respiratory: Positive for cough and SOB. Gastrointestinal: No abdominal pain.  No nausea, no vomiting.   Musculoskeletal: Positive for body aches. Skin: Negative for rash, abrasions, lacerations, ecchymosis. Neurological: Negative for headaches.   ____________________________________________   PHYSICAL EXAM:  VITAL SIGNS: ED Triage Vitals  Enc Vitals Group     BP 01/09/19 1509 113/72     Pulse Rate 01/09/19 1509 (!) 111     Resp 01/09/19 1509 20     Temp 01/09/19 1509 98.9 F (37.2 C)  Temp Source 01/09/19 1509 Oral     SpO2 01/09/19 1509 96 %     Weight 01/09/19 1510 250 lb (113.4 kg)     Height 01/09/19 1510 6\' 5"  (1.956 m)     Head Circumference --      Peak Flow --      Pain Score 01/09/19 1510 8     Pain Loc --      Pain Edu? --      Excl. in [place]? --      Constitutional: Alert and oriented. Well appearing and in no acute distress. Eyes: Conjunctivae are normal. PERRL. EOMI. No discharge. Head: Atraumatic. ENT: No frontal  and maxillary sinus tenderness.      Ears: Tympanic membranes pearly gray with good landmarks. No discharge.      Nose: No congestion/rhinnorhea.      Mouth/Throat: Mucous membranes are moist. Oropharynx non-erythematous. Tonsils not enlarged. No exudates. Uvula midline. Neck: No stridor.   Hematological/Lymphatic/Immunilogical: No cervical lymphadenopathy. Cardiovascular: Normal rate, regular rhythm.  Good peripheral circulation. Respiratory: Normal respiratory effort without tachypnea or retractions. Lungs CTAB. Good air entry to the bases with no decreased or absent breath sounds. Gastrointestinal: Bowel sounds 4 quadrants. Soft and nontender to palpation. No guarding or rigidity. No palpable masses. No distention. Musculoskeletal: Full range of motion to all extremities. No gross deformities appreciated. Neurologic:  Normal speech and language. No gross focal neurologic deficits are appreciated.  Skin:  Skin is warm, dry and intact.  1+ pitting edema Psychiatric: Mood and affect are normal. Speech and behavior are normal. Patient exhibits appropriate insight and judgement.   ____________________________________________   LABS (all labs ordered are listed, but only abnormal results are displayed)  Labs Reviewed  CBC - Abnormal; Notable for the following components:      Result Value   RBC 4.14 (*)    All other components within normal limits  COMPREHENSIVE METABOLIC PANEL - Abnormal; Notable for the following components:   Glucose, Bld 69 (*)    Calcium 8.5 (*)    Total Bilirubin 1.5 (*)    All other components within normal limits  INFLUENZA PANEL BY PCR (TYPE A & B) - Abnormal; Notable for the following components:   Influenza B By PCR POSITIVE (*)    All other components within normal limits  BRAIN NATRIURETIC PEPTIDE - Abnormal; Notable for the following components:   B Natriuretic Peptide 1,169.0 (*)    All other components within normal limits  TROPONIN I - Abnormal;  Notable for the following components:   Troponin I 1.05 (*)    All other components within normal limits  MAGNESIUM  PHOSPHORUS  LACTIC ACID, PLASMA  HIV ANTIBODY (ROUTINE TESTING W REFLEX)  CALCIUM, IONIZED  TROPONIN I  PROCALCITONIN  STREP PNEUMONIAE URINARY ANTIGEN  LEGIONELLA PNEUMOPHILA SEROGP 1 UR AG  DIFFERENTIAL  URINALYSIS, ROUTINE W REFLEX MICROSCOPIC  URINE DRUG SCREEN, QUALITATIVE ([facility] ONLY)   ____________________________________________  EKG  ST  ____________________________________________  RADIOLOGY Robinette Haines, personally viewed and evaluated these images (plain radiographs) as part of my medical decision making, as well as reviewing the written report by the radiologist.  Dg Chest 2 View  Result Date: 01/09/2019 CLINICAL DATA:  Cough and shortness of breath EXAM: CHEST - 2 VIEW COMPARISON:  December 23, 2018 FINDINGS: There is a subtle area of increased opacity in the left upper lobe. The lungs elsewhere are clear. Heart remains enlarged with pulmonary vascularity normal. No adenopathy. No bone lesions. IMPRESSION: Subtle  area of increased opacity in the left upper lobe concerning for early pneumonia. Lungs elsewhere clear. Stable cardiomegaly. Followup PA and lateral chest radiographs recommended in 3-4 weeks following trial of antibiotic therapy to ensure resolution and exclude underlying malignancy. Electronically Signed   By: Lowella Grip III M.D.   On: 01/09/2019 16:33    ____________________________________________    PROCEDURES  Procedure(s) performed:    Procedures    Medications  oseltamivir (TAMIFLU) capsule 75 mg (has no administration in time range)  furosemide (LASIX) injection 20 mg (has no administration in time range)  dextrose 50 % solution 50 mL (has no administration in time range)  nitroGLYCERIN (NITROSTAT) SL tablet 0.4 mg (has no administration in time range)  morphine 2 MG/ML injection 2 mg (has no administration in  time range)    Or  morphine 4 MG/ML injection 4 mg (has no administration in time range)  insulin glargine (LANTUS) injection 20 Units (has no administration in time range)  ipratropium-albuterol (DUONEB) 0.5-2.5 (3) MG/3ML nebulizer solution 3 mL (3 mLs Nebulization Given 01/09/19 1759)  ipratropium-albuterol (DUONEB) 0.5-2.5 (3) MG/3ML nebulizer solution 3 mL (3 mLs Nebulization Given 01/09/19 2000)  acetaminophen (TYLENOL) tablet 650 mg (650 mg Oral Given 01/09/19 1959)  levofloxacin (LEVAQUIN) tablet 500 mg (500 mg Oral Given 01/09/19 1959)  oseltamivir (TAMIFLU) capsule 75 mg (75 mg Oral Given 01/09/19 1959)  vancomycin (VANCOCIN) 2,000 mg in sodium chloride 0.9 % 500 mL IVPB (2,000 mg Intravenous New Bag/Given 01/09/19 2111)     ____________________________________________   INITIAL IMPRESSION / ASSESSMENT AND PLAN / ED COURSE  Pertinent labs & imaging results that were available during my care of the patient were reviewed by me and considered in my medical decision making (see chart for details).  Review of the [place] CSRS was performed in accordance of the [place] prior to dispensing any controlled drugs.     Patient presented to emergency department for evaluation of fever, cough, shortness of breath, feet leg swelling for 3 days. Patient is mildy tachycardic and has a low grade fever in the emergency department.  Influenza test is positive.  Chest x-ray is concerning for left upper lobe pneumonia and stable cardiomegaly.  Patient has been treated for pneumonia in different locations 3 times previously in the emergency department in the last 2 months.  CHF elevated at 1169.  Case was discussed with Dr. Quentin Cornwall, who recommends admission for influenza, recurrent pneumonia, and probable new CHF. Patient was given IV Vancomycin and oral Levaquin in the emergency department. Report was given to Dr. Aliene Altes. Patient was transferred to main ED while awaiting bed  request.    ____________________________________________  FINAL CLINICAL IMPRESSION(S) / ED DIAGNOSES  Final diagnoses:  Influenza  Elevated brain natriuretic peptide (BNP) level      NEW MEDICATIONS STARTED DURING THIS VISIT:  ED Discharge Orders    None          This chart was dictated using voice recognition software/Dragon. Despite best efforts to proofread, errors can occur which can change the meaning. Any change was purely unintentional.    Laban Emperor, PA-C 01/09/19 2334    Merlyn Lot, MD 01/09/19 [phone]

## 2019-01-09 NOTE — ED Notes (Signed)
See triage note  Presents with cough and some body aches  sxs' started about 3 days ago   Low grade fever noted on arrival

## 2019-01-09 NOTE — H&P (Signed)
[place] [facility] [place] NAME: Gary Bentley    MR#:  [id number]  DATE OF BIRTH:  11/02/1972  DATE OF ADMISSION:  01/09/2019  PRIMARY CARE PHYSICIAN: Patient, No Pcp Per   REQUESTING/REFERRING PHYSICIAN: Laban Emperor, PA-C  CHIEF COMPLAINT:   Chief Complaint  Patient presents with  . Cough  . Generalized Body Aches    HISTORY OF PRESENT ILLNESS:  Gary Bentley  is a 48 y.o. male with a known history of T2IDDM, HTN p/w subjective fever, cough, SOB, myalgias, leg edema. AAOx3, but falls asleep multiple times while I am talking to him. Endorses 4d Hx progressively worsening SOB. Endorses cough productive of yellow sputum. Endorses chest pain, mid-chest, constant, sharp; worse w/ exertion; worse w/ movement/position change; worse w/ cough and deep inspiration. Endorses subjective fever, but denies chills, rigors, diaphoresis. Endorses leg edema x1wk, does not improve w/ recumbence. Denies orthopnea, but states he uses two pillows to sleep and endorses 1ep PND overnight Nancy Fetter 02/16 into Mon 02/17). Influenza B (+). WBC 4.0, afebrile in ED; tachycardic, tachypneic, SIRS (+). CXR report states, "Subtle area of increased opacity in the left upper lobe concerning for early pneumonia. Lungs elsewhere clear. Stable cardiomegaly." Appears ill/miserable, but does not appear toxic.   PAST MEDICAL HISTORY:   Past Medical History:  Diagnosis Date  . Diabetes mellitus without complication ([place])     PAST SURGICAL HISTORY:   Past Surgical History:  Procedure Laterality Date  . FOOT SURGERY      SOCIAL HISTORY:   Social History   Tobacco Use  . Smoking status: Former Smoker    Packs/day: 1.00    Types: Cigarettes    Last attempt to quit: 04/17/2017    Years since quitting: 1.7  . Smokeless tobacco: Never Used  Substance Use Topics  . Alcohol use: No    FAMILY HISTORY:   Family History  Family history unknown: Yes    DRUG ALLERGIES:    No Known Allergies  REVIEW OF SYSTEMS:   Review of Systems  Constitutional: Positive for fever and malaise/fatigue. Negative for chills, diaphoresis and weight loss.  HENT: Negative for congestion, ear pain, hearing loss, sinus pain, sore throat and tinnitus.   Eyes: Negative for blurred vision, double vision and photophobia.  Respiratory: Positive for cough, sputum production and shortness of breath. Negative for hemoptysis and wheezing.   Cardiovascular: Positive for chest pain, leg swelling and PND. Negative for palpitations, orthopnea and claudication.  Gastrointestinal: Negative for abdominal pain, blood in stool, constipation, diarrhea, heartburn, melena, nausea and vomiting.  Genitourinary: Negative for dysuria, frequency, hematuria and urgency.  Musculoskeletal: Positive for myalgias. Negative for back pain, falls, joint pain and neck pain.  Skin: Negative for itching and rash.  Neurological: Positive for weakness. Negative for dizziness, tingling, tremors, sensory change, speech change, focal weakness, seizures, loss of consciousness and headaches.  Psychiatric/Behavioral: Negative for depression and memory loss. The patient is not nervous/anxious and does not have insomnia.    MEDICATIONS AT HOME:   Prior to Admission medications   Medication Sig Start Date End Date Taking? Authorizing Provider  albuterol (PROVENTIL HFA;VENTOLIN HFA) 108 (90 Base) MCG/ACT inhaler Inhale 2 puffs into the lungs every 6 (six) hours as needed for wheezing or shortness of breath. 10/24/18  Yes Laban Emperor, PA-C  benzonatate (TESSALON PERLES) 100 MG capsule Take 1 capsule (100 mg total) by mouth every 6 (six) hours as needed for cough. 12/23/18 12/23/19 Yes McShane, Gerda Diss,  MD  insulin aspart protamine - aspart (NOVOLOG MIX 70/30 FLEXPEN) (70-30) 100 UNIT/ML FlexPen Inject 30 Units into the skin 2 (two) times daily.   Yes [provider]  lisinopril (PRINIVIL,ZESTRIL) 10 MG tablet Take 1  tablet (10 mg total) by mouth daily. 03/23/18  Yes Epifanio Lesches, MD  metFORMIN (GLUCOPHAGE) 1000 MG tablet Take 1 tablet (1,000 mg total) by mouth 2 (two) times daily with a meal. 03/24/18  Yes Salary, Montell D, MD  ondansetron (ZOFRAN) 4 MG tablet Take 1 tablet (4 mg total) by mouth every 8 (eight) hours as needed for nausea or vomiting. 12/23/18  Yes McShane, Gerda Diss, MD  oxyCODONE-acetaminophen (PERCOCET) 5-325 MG tablet Take 1 tablet by mouth every 4 (four) hours as needed for severe pain. 12/23/18  Yes Schuyler Amor, MD      VITAL SIGNS:  Blood pressure (!) 143/91, pulse 98, temperature 99.4 F (37.4 C), resp. rate 20, height 6\' 5"  (1.956 m), weight 113.4 kg, SpO2 95 %.  PHYSICAL EXAMINATION:  Physical Exam Constitutional:      General: He is not in acute distress.    Appearance: He is ill-appearing. He is not toxic-appearing or diaphoretic.     Interventions: He is not intubated. HENT:     Head: Atraumatic.     Mouth/Throat:     Pharynx: Oropharynx is clear.  Eyes:     General: No scleral icterus.    Extraocular Movements: Extraocular movements intact.     Conjunctiva/sclera: Conjunctivae normal.  Neck:     Musculoskeletal: Neck supple.  Cardiovascular:     Rate and Rhythm: Regular rhythm. Tachycardia present.     Heart sounds: Normal heart sounds. No murmur. No friction rub. No gallop.   Pulmonary:     Effort: Tachypnea present. No bradypnea, accessory muscle usage, respiratory distress or retractions. He is not intubated.     Breath sounds: Decreased air movement present. No stridor or transmitted upper airway sounds. Examination of the right-upper field reveals decreased breath sounds. Examination of the left-upper field reveals decreased breath sounds. Examination of the right-middle field reveals decreased breath sounds. Examination of the left-middle field reveals decreased breath sounds. Examination of the right-lower field reveals decreased breath sounds.  Examination of the left-lower field reveals decreased breath sounds. Decreased breath sounds present. No wheezing, rhonchi or rales.  Abdominal:     General: Bowel sounds are normal. There is no distension.     Palpations: Abdomen is soft.     Tenderness: There is no abdominal tenderness. There is no guarding or rebound.  Musculoskeletal: Normal range of motion.        General: No tenderness.     Right lower leg: Edema present.     Left lower leg: Edema present.  Lymphadenopathy:     Cervical: No cervical adenopathy.  Skin:    General: Skin is warm and dry.     Findings: No erythema or rash.  Neurological:     Mental Status: He is oriented to person, place, and time.  Psychiatric:        Attention and Perception: Perception normal. He is inattentive.        Mood and Affect: Mood and affect normal.        Speech: Speech is delayed.        Behavior: Behavior is slowed and withdrawn. Behavior is cooperative.        Thought Content: Thought content normal.        Cognition and Memory: Cognition  and memory normal.        Judgment: Judgment normal.    LABORATORY PANEL:   CBC Recent Labs  Lab 01/09/19 1742  WBC 4.0  HGB 13.0  HCT 41.1  PLT 287   ------------------------------------------------------------------------------------------------------------------  Chemistries  Recent Labs  Lab 01/09/19 1742 01/09/19 2115  NA 138  --   K 3.8  --   CL 108  --   CO2 24  --   GLUCOSE 69*  --   BUN 13  --   CREATININE 1.18  --   CALCIUM 8.5*  --   MG  --  1.7  AST 29  --   ALT 19  --   ALKPHOS 61  --   BILITOT 1.5*  --    ------------------------------------------------------------------------------------------------------------------  Cardiac Enzymes Recent Labs  Lab 01/09/19 2115  TROPONINI 1.05*   ------------------------------------------------------------------------------------------------------------------  RADIOLOGY:  Dg Chest 2 View  Result Date:  01/09/2019 CLINICAL DATA:  Cough and shortness of breath EXAM: CHEST - 2 VIEW COMPARISON:  December 23, 2018 FINDINGS: There is a subtle area of increased opacity in the left upper lobe. The lungs elsewhere are clear. Heart remains enlarged with pulmonary vascularity normal. No adenopathy. No bone lesions. IMPRESSION: Subtle area of increased opacity in the left upper lobe concerning for early pneumonia. Lungs elsewhere clear. Stable cardiomegaly. Followup PA and lateral chest radiographs recommended in 3-4 weeks following trial of antibiotic therapy to ensure resolution and exclude underlying malignancy. Electronically Signed   By: Lowella Grip III M.D.   On: 01/09/2019 16:33   IMPRESSION AND PLAN:   A/P: 52M w/ PMHx T2IDDM, HTN p/w subjective fever, cough, SOB, myalgias, CP, leg edema. Influenza B (+). SIRS (+). CXR w/ questionable LUL opacity, cardiomegaly. Hypoglycemia (w/ T2IDDM), hypocalcemia, mild hyperbilirubinemia, proteinuria. -Subjective fever, cough, SOB, myalgias, Influenza B (+), SIRS (+): Afebrile in ED, WBC 4.0. tachycardic, tachypneic, SIRS (+). Lungs diminished but clear, (-) R/R/W. CXR report states, "Subtle area of increased opacity in the left upper lobe concerning for early pneumonia. Lungs elsewhere clear. Stable cardiomegaly." Reviewed image, reported LUL finding unimpressive. Pt appears ill/miserable, but does not appear toxic. Lactate 1.8. PCT pending. Lower suspicion for acute bacterial infxn. Received ABx in ED. Was not cultured prior to ABx; not ordered for Cx (expected low yield). Have not continued ABx at present. c/w Tamiflu. UStrep + ULeg Ag pending. -CP, leg edema: Endorses persistent leg edema, PND. CXR (+) cardiomegaly. BNP 1169. Trop-I pending. Presentation is highly concerning for cardiac disease (suspect new-onset CHF/ischemic cardiomyopathy in the setting of CAD, possible ACS). Echo pending. ASA, morphine, NTG, O2. Cardiac monitoring. Lasix, I&O, daily weight. c/w  Lisinopril, start Coreg (as tolerated), start Atorvastatin. -Hypoglycemia, T2IDDM, proteinuria: D50 PRN. SSI. Lantus. c/w Lisinopril. -Hypocalcemia: Ionized calcium. -c/w other home meds/formulary subs as tolerated. -FEN/GI: Cardiac diabetic diet. -DVT PPx: Lovenox. -Code status: Full code. -Disposition: Admission, > 2 midnights.  -Addendum: Trop-I 1.05. Trend. NPO. Heparin gtt. Cardiology consult.   All the records are reviewed and case discussed with ED provider. Management plans discussed with the patient, family and they are in agreement.  CODE STATUS: Full code.  TOTAL TIME TAKING CARE OF THIS PATIENT: 75 minutes.    Arta Silence M.D on 01/09/2019 at 11:01 PM  Between 7am to 6pm - Pager - [phone]  After 6pm go to [email or web address] - Proofreader  [facility]  Office  [phone]  CC: Primary care physician; Patient, No Pcp Per   Note: This dictation  was prepared with Dragon dictation along with smaller phrase technology. Any transcriptional errors that result from this process are unintentional.

## 2019-01-09 NOTE — ED Triage Notes (Signed)
Cough and body aches x 3 days.

## 2019-01-10 ENCOUNTER — Inpatient Hospital Stay (HOSPITAL_COMMUNITY)
Admit: 2019-01-10 | Discharge: 2019-01-10 | Disposition: A | Discharge status: Home / Self Care | Payer: 59 | Attending: Internal Medicine | Admitting: Internal Medicine

## 2019-01-10 ENCOUNTER — Other Ambulatory Visit: Payer: Self-pay

## 2019-01-10 DIAGNOSIS — R7989 Other specified abnormal findings of blood chemistry: Secondary | ICD-10-CM

## 2019-01-10 DIAGNOSIS — E119 Type 2 diabetes mellitus without complications: Secondary | ICD-10-CM

## 2019-01-10 DIAGNOSIS — R0602 Shortness of breath: Secondary | ICD-10-CM

## 2019-01-10 DIAGNOSIS — R0789 Other chest pain: Secondary | ICD-10-CM

## 2019-01-10 LAB — PROCALCITONIN: Procalcitonin: <0.1 ng/mL

## 2019-01-10 LAB — DIFFERENTIAL
Abs Immature Granulocytes: 0.01 10*3/uL (ref 0.00–0.07)
Basophils Absolute: 0 10*3/uL (ref 0.0–0.1)
Basophils Relative: 1 %
Eosinophils Absolute: 0 10*3/uL (ref 0.0–0.5)
Eosinophils Relative: 0 %
Immature Granulocytes: 0 %
LYMPHS ABS: 0.4 10*3/uL — AB (ref 0.7–4.0)
Lymphocytes Relative: 10 %
Monocytes Absolute: 1 10*3/uL (ref 0.1–1.0)
Monocytes Relative: 24 %
Neutro Abs: 2.6 10*3/uL (ref 1.7–7.7)
Neutrophils Relative %: 65 %

## 2019-01-10 LAB — BASIC METABOLIC PANEL
Anion gap: 5 (ref 5–15)
BUN: 17 mg/dL (ref 6–20)
CO2: 25 mmol/L (ref 22–32)
Calcium: 8 mg/dL — ABNORMAL LOW (ref 8.9–10.3)
Chloride: 104 mmol/L (ref 98–111)
Creatinine, Ser: 1.31 mg/dL — ABNORMAL HIGH (ref 0.61–1.24)
GFR calc Af Amer: >60 mL/min (ref >60)
GFR calc non Af Amer: >60 mL/min (ref >60)
GLUCOSE: 123 mg/dL — AB (ref 70–99)
Potassium: 3.7 mmol/L (ref 3.5–5.1)
Sodium: 134 mmol/L — ABNORMAL LOW (ref 135–145)

## 2019-01-10 LAB — ECHOCARDIOGRAM COMPLETE
Height: 77 in
Weight: 4000 oz

## 2019-01-10 LAB — TROPONIN I
Troponin I: 0.78 ng/mL (ref <0.03)
Troponin I: 0.85 ng/mL (ref <0.03)
Troponin I: 0.86 ng/mL (ref <0.03)

## 2019-01-10 LAB — CBC
HCT: 42.7 % (ref 39.0–52.0)
Hemoglobin: 13.5 g/dL (ref 13.0–17.0)
MCH: 31.3 pg (ref 26.0–34.0)
MCHC: 31.6 g/dL (ref 30.0–36.0)
MCV: 98.8 fL (ref 80.0–100.0)
Platelets: 263 10*3/uL (ref 150–400)
RBC: 4.32 MIL/uL (ref 4.22–5.81)
RDW: 13.1 % (ref 11.5–15.5)
WBC: 3.4 10*3/uL — AB (ref 4.0–10.5)
nRBC: 0 % (ref 0.0–0.2)

## 2019-01-10 LAB — HIV ANTIBODY (ROUTINE TESTING W REFLEX): HIV Screen 4th Generation wRfx: NONREACTIVE

## 2019-01-10 LAB — HEPARIN LEVEL (UNFRACTIONATED): Heparin Unfractionated: 0.4 IU/mL (ref 0.30–0.70)

## 2019-01-10 LAB — STREP PNEUMONIAE URINARY ANTIGEN: STREP PNEUMO URINARY ANTIGEN: NEGATIVE

## 2019-01-10 LAB — GLUCOSE, CAPILLARY
Glucose-Capillary: 73 mg/dL (ref 70–99)
Glucose-Capillary: 91 mg/dL (ref 70–99)
Glucose-Capillary: 104 mg/dL — ABNORMAL HIGH (ref 70–99)
Glucose-Capillary: 115 mg/dL — ABNORMAL HIGH (ref 70–99)
Glucose-Capillary: 86 mg/dL (ref 70–99)

## 2019-01-10 LAB — LACTIC ACID, PLASMA
Lactic Acid, Venous: 1.3 mmol/L (ref 0.5–1.9)
Lactic Acid, Venous: 1.5 mmol/L (ref 0.5–1.9)

## 2019-01-10 LAB — PROTIME-INR
INR: 1.13
Prothrombin Time: 14.4 seconds (ref 11.4–15.2)

## 2019-01-10 LAB — APTT: aPTT: 35 seconds (ref 24–36)

## 2019-01-10 MED ORDER — ASPIRIN 325 MG PO TABS
325.0000 mg | ORAL_TABLET | Freq: Once | ORAL | Status: AC
  Administered 2019-01-10: 325 mg via ORAL
  Filled 2019-01-10: qty 1

## 2019-01-10 MED ORDER — INSULIN ASPART 100 UNIT/ML ~~LOC~~ SOLN
0.0000 [IU] | Freq: Three times a day (TID) | SUBCUTANEOUS | Status: DC
  Administered 2019-01-12: 1 [IU] via SUBCUTANEOUS
  Administered 2019-01-13: 2 [IU] via SUBCUTANEOUS
  Administered 2019-01-14: 1 [IU] via SUBCUTANEOUS
  Administered 2019-01-14: 2 [IU] via SUBCUTANEOUS
  Administered 2019-01-14: 1 [IU] via SUBCUTANEOUS
  Filled 2019-01-10 (×5): qty 1

## 2019-01-10 MED ORDER — ACETAMINOPHEN 325 MG PO TABS
650.0000 mg | ORAL_TABLET | Freq: Four times a day (QID) | ORAL | Status: DC | PRN
  Administered 2019-01-10 – 2019-01-13 (×5): 650 mg via ORAL
  Filled 2019-01-10 (×4): qty 2

## 2019-01-10 MED ORDER — ATORVASTATIN CALCIUM 20 MG PO TABS
40.0000 mg | ORAL_TABLET | Freq: Every day | ORAL | Status: DC
  Administered 2019-01-10 – 2019-01-14 (×6): 40 mg via ORAL
  Filled 2019-01-10 (×6): qty 2

## 2019-01-10 MED ORDER — HEPARIN (PORCINE) 25000 UT/250ML-% IV SOLN
1600.0000 [IU]/h | INTRAVENOUS | Status: DC
  Administered 2019-01-10 (×2): 1500 [IU]/h via INTRAVENOUS
  Administered 2019-01-11: 1800 [IU]/h via INTRAVENOUS
  Filled 2019-01-10 (×3): qty 250

## 2019-01-10 MED ORDER — OXYCODONE-ACETAMINOPHEN 5-325 MG PO TABS
1.0000 | ORAL_TABLET | ORAL | Status: DC | PRN
  Administered 2019-01-10 – 2019-01-15 (×6): 1 via ORAL
  Filled 2019-01-10 (×6): qty 1

## 2019-01-10 MED ORDER — SENNOSIDES-DOCUSATE SODIUM 8.6-50 MG PO TABS: 1.0000 | ORAL_TABLET | Freq: Every evening | ORAL | Status: DC | PRN

## 2019-01-10 MED ORDER — CARVEDILOL 6.25 MG PO TABS
3.1250 mg | ORAL_TABLET | Freq: Two times a day (BID) | ORAL | Status: DC
  Administered 2019-01-10: 3.125 mg via ORAL
  Filled 2019-01-10: qty 1

## 2019-01-10 MED ORDER — SODIUM CHLORIDE 0.9% FLUSH
3.0000 mL | Freq: Two times a day (BID) | INTRAVENOUS | Status: DC
  Administered 2019-01-10 – 2019-01-14 (×8): 3 mL via INTRAVENOUS

## 2019-01-10 MED ORDER — HEPARIN BOLUS VIA INFUSION
4000.0000 [IU] | Freq: Once | INTRAVENOUS | Status: AC
  Administered 2019-01-10: 4000 [IU] via INTRAVENOUS
  Filled 2019-01-10: qty 4000

## 2019-01-10 MED ORDER — ONDANSETRON HCL 4 MG/2ML IJ SOLN: 4.0000 mg | Freq: Four times a day (QID) | INTRAMUSCULAR | Status: DC | PRN

## 2019-01-10 MED ORDER — METOPROLOL TARTRATE 25 MG PO TABS: 25.0000 mg | ORAL_TABLET | Freq: Two times a day (BID) | ORAL | Status: DC

## 2019-01-10 MED ORDER — ASPIRIN 81 MG PO CHEW: 81.0000 mg | CHEWABLE_TABLET | Freq: Every day | ORAL | Status: DC

## 2019-01-10 MED ORDER — ALBUTEROL SULFATE (2.5 MG/3ML) 0.083% IN NEBU: 2.5000 mg | INHALATION_SOLUTION | Freq: Four times a day (QID) | RESPIRATORY_TRACT | Status: DC | PRN

## 2019-01-10 MED ORDER — HEPARIN BOLUS VIA INFUSION
3000.0000 [IU] | Freq: Once | INTRAVENOUS | Status: AC
  Administered 2019-01-10: 3000 [IU] via INTRAVENOUS
  Filled 2019-01-10: qty 3000

## 2019-01-10 MED ORDER — ASPIRIN EC 81 MG PO TBEC
81.0000 mg | DELAYED_RELEASE_TABLET | Freq: Every day | ORAL | Status: DC
  Administered 2019-01-10 – 2019-01-15 (×5): 81 mg via ORAL
  Filled 2019-01-10 (×5): qty 1

## 2019-01-10 MED ORDER — FUROSEMIDE 10 MG/ML IJ SOLN
20.0000 mg | Freq: Once | INTRAMUSCULAR | Status: AC
  Administered 2019-01-10: 20 mg via INTRAVENOUS
  Filled 2019-01-10: qty 4

## 2019-01-10 MED ORDER — ONDANSETRON HCL 4 MG PO TABS: 4.0000 mg | ORAL_TABLET | Freq: Four times a day (QID) | ORAL | Status: DC | PRN

## 2019-01-10 MED ORDER — ACETAMINOPHEN 650 MG RE SUPP
650.0000 mg | Freq: Four times a day (QID) | RECTAL | Status: DC | PRN
  Filled 2019-01-10: qty 1

## 2019-01-10 MED ORDER — FUROSEMIDE 10 MG/ML IJ SOLN
40.0000 mg | Freq: Once | INTRAMUSCULAR | Status: AC
  Administered 2019-01-10: 40 mg via INTRAVENOUS
  Filled 2019-01-10: qty 4

## 2019-01-10 MED ORDER — BISACODYL 5 MG PO TBEC: 5.0000 mg | DELAYED_RELEASE_TABLET | Freq: Every day | ORAL | Status: DC | PRN

## 2019-01-10 MED ORDER — LISINOPRIL 10 MG PO TABS
10.0000 mg | ORAL_TABLET | Freq: Every day | ORAL | Status: DC
  Administered 2019-01-10 – 2019-01-12 (×3): 10 mg via ORAL
  Filled 2019-01-10 (×3): qty 1

## 2019-01-10 MED ORDER — POTASSIUM CHLORIDE CRYS ER 20 MEQ PO TBCR
40.0000 meq | EXTENDED_RELEASE_TABLET | Freq: Once | ORAL | Status: AC
  Administered 2019-01-10: 40 meq via ORAL
  Filled 2019-01-10: qty 2

## 2019-01-10 NOTE — Progress Notes (Signed)
ANTICOAGULATION CONSULT NOTE - Initial Consult  Pharmacy Consult for heparin drip Indication: chest pain/ACS  No Known Allergies  Patient Measurements: Height: 6\' 5"  (195.6 cm) Weight: 250 lb (113.4 kg) IBW/kg (Calculated) : 89.1 Heparin Dosing Weight: 112 kg  Vital Signs: Temp: 98.6 F (37 C) (02/18 0934) Temp Source: Oral (02/18 0934) BP: 115/89 (02/18 0934) Pulse Rate: 102 (02/18 0934)  Labs: Recent Labs    01/09/19 1742  01/10/19 0050 01/10/19 0353 01/10/19 0632  HGB 13.0  --   --   --   --   HCT 41.1  --   --   --   --   PLT 287  --   --   --   --   APTT  --   --  35  --   --   LABPROT  --   --  14.4  --   --   INR  --   --  1.13  --   --   CREATININE 1.18  --   --   --   --   TROPONINI  --    < > 0.78* 0.85* 0.86*   < > = values in this interval not displayed.    Estimated Creatinine Clearance: 110.5 mL/min (by C-G formula based on SCr of 1.18 mg/dL).   Medical History: Past Medical History:  Diagnosis Date  . Diabetes mellitus without complication ([facility])     Medications:  No anticoag in PTA meds.  Assessment: Trop 1.05   Spoke with nurse Caryl Pina in ED - at shift change heparin line was "bad" and needed to be changed - unclear on how long drip may have been interrupted (she guessed an hour)  Goal of Therapy:  Heparin level 0.3-0.7 units/ml Monitor platelets by anticoagulation protocol: Yes   Plan:  HL 0.4 @ 0939 - Continue rate of 1500 units/hr Will recheck HL in 6 hours and CBC's daily per protocol  Lu Duffel, PharmD, BCPS Clinical Pharmacist 01/10/2019 11:22 AM

## 2019-01-10 NOTE — Progress Notes (Signed)
ANTICOAGULATION CONSULT NOTE - Initial Consult  Pharmacy Consult for heparin drip Indication: chest pain/ACS  No Known Allergies  Patient Measurements: Height: 6\' 5"  (195.6 cm) Weight: 250 lb (113.4 kg) IBW/kg (Calculated) : 89.1 Heparin Dosing Weight: 112 kg  Vital Signs: Temp: 99.4 F (37.4 C) (02/17 2112) Temp Source: Oral (02/17 1929) BP: 136/99 (02/17 2326) Pulse Rate: 96 (02/17 2326)  Labs: Recent Labs    01/09/19 1742 01/09/19 2115  HGB 13.0  --   HCT 41.1  --   PLT 287  --   CREATININE 1.18  --   TROPONINI  --  1.05*    Estimated Creatinine Clearance: 110.5 mL/min (by C-G formula based on SCr of 1.18 mg/dL).   Medical History: Past Medical History:  Diagnosis Date  . Diabetes mellitus without complication ([facility])     Medications:  No anticoag in PTA meds.  Assessment: Trop 1.05   Goal of Therapy:  Heparin level 0.3-0.7 units/ml Monitor platelets by anticoagulation protocol: Yes   Plan:  4000 unit bolus and initial rate of 1500 units/hr. First heparin level 6 hours after start of infusion.  Sim Boast, PharmD, BCPS  01/10/19 12:15 AM

## 2019-01-10 NOTE — Progress Notes (Signed)
*  PRELIMINARY RESULTS* Echocardiogram 2D Echocardiogram has been performed.  Gary Bentley 01/10/2019, 1:35 PM

## 2019-01-10 NOTE — Progress Notes (Signed)
Cardiology Consultation:   Patient ID: Gary Bentley MRN: [id number]; DOB: 11/02/1972  Admit date: 01/09/2019 Date of Consult: 01/10/2019  Primary Care Provider: Patient, No Pcp Per Primary Cardiologist: [name] Primary Electrophysiologist:  None    Patient Profile:   Gary Bentley is a 48 y.o. male with a hx of DM and pneumonia x3 in the past 2 months who is being seen today for the evaluation of elevated troponin, elevated BNP, chest pain and lower extremity edema at the request of Dr. Jodell Cipro.   History of Present Illness:   Gary Bentley is a 48 year old male with history as above who presented to ED influenza B positive. Does not have any significant cardiac history. He was seen in the ED 3 times in the past month for URI symptoms, abdominal pain, nausea and vomiting. On 1/03 patient presented with nausea, abdominal pain and cough. CXR showed faint infiltrate at the right lung base and CT abdomen with no acute abnormalities and resolution of his previous colitis. He felt better with IVF and Zofran and was discharged with Levaquin. Patient presented to ED on 1/29 with flu-like symptoms. CXR was negative and influenza test was negative. Was advised that he may have had viral illness and was discharged. He presented again to ED on  1/31 for URI symptoms, abdominal pain, and diarrhea. CXR showed stable cardiomegaly with mild chronic interstitial changes. CT abdomen showed findings consistent with colitis as well as left lower lobe infiltrate. He was given antibiotics and was discharged.  [name] presented to ED yesterday complaining of fever, body aches, SOB with exertion, LEE and chest pain. States that chest pain has been going on for a week. Chest pain is intermittent, rates the pain as 6/10 and pressure-like and is left sided. Does not radiate anywhere else. Chest pain only happens with coughing. He also endorses tenderness with palpation of chest. Denies any exertional chest  pain in the past or hemoptysis. He also notes lower extremity swelling for the past couple of days as well as ongoing shortness of breath with exertion. He also reports mild bilateral lower extremity soreness. Denies being sedentary, recent travel, recent surgeries, redness, unilateral enlargement, orthopnea or recent weight gain. Patient reports smoking 1ppd for 8 years. Denies any alcohol or illegal drug use. Denies having any family history of heart disease or MI.   Patient is currently chest pain free. ED labs significant for elevated flat trending troponin with peak of 1.05. BNP 1169, WBC 4.0, Hgb 13.0, Cr 1.18, K 3.8. CXR significant for LUL PNA and cardiomegaly. He is flu positive. Echo and EKG results pending. Patient was started on IV Levaquin and Vancomycin in the ED. He was also started on ASA, Lipitor, Coreg, Lasix, Heparin drip and Lisinopril.    Past Medical History:  Diagnosis Date  . Diabetes mellitus without complication [facility])     Past Surgical History:  Procedure Laterality Date  . FOOT SURGERY       Home Medications:  Prior to Admission medications   Medication Sig Start Date Ahleah Simko Date Taking? Authorizing Provider  albuterol (PROVENTIL HFA;VENTOLIN HFA) 108 (90 Base) MCG/ACT inhaler Inhale 2 puffs into the lungs every 6 (six) hours as needed for wheezing or shortness of breath. 10/24/18  Yes Laban Emperor, PA-C  benzonatate (TESSALON PERLES) 100 MG capsule Take 1 capsule (100 mg total) by mouth every 6 (six) hours as needed for cough. 12/23/18 12/23/19 Yes Schuyler Amor, MD  insulin aspart protamine - aspart (  NOVOLOG MIX 70/30 FLEXPEN) (70-30) 100 UNIT/ML FlexPen Inject 30 Units into the skin 2 (two) times daily.   Yes [provider]  lisinopril (PRINIVIL,ZESTRIL) 10 MG tablet Take 1 tablet (10 mg total) by mouth daily. 03/23/18  Yes Epifanio Lesches, MD  metFORMIN (GLUCOPHAGE) 1000 MG tablet Take 1 tablet (1,000 mg total) by mouth 2 (two) times daily with a meal.  03/24/18  Yes Salary, Montell D, MD  ondansetron (ZOFRAN) 4 MG tablet Take 1 tablet (4 mg total) by mouth every 8 (eight) hours as needed for nausea or vomiting. 12/23/18  Yes McShane, Gerda Diss, MD  oxyCODONE-acetaminophen (PERCOCET) 5-325 MG tablet Take 1 tablet by mouth every 4 (four) hours as needed for severe pain. 12/23/18  Yes Schuyler Amor, MD    Inpatient Medications: Scheduled Meds: . aspirin EC  81 mg Oral Daily  . atorvastatin  40 mg Oral q1800  . insulin aspart  0-9 Units Subcutaneous TID WC  . lisinopril  10 mg Oral Daily  . metoprolol tartrate  25 mg Oral BID  . oseltamivir  75 mg Oral BID   Continuous Infusions: . heparin 1,500 Units/hr (01/10/19 0151)   PRN Meds: acetaminophen **OR** acetaminophen, albuterol, bisacodyl, dextrose, morphine injection **OR** morphine injection, nitroGLYCERIN, ondansetron **OR** ondansetron (ZOFRAN) IV, oxyCODONE-acetaminophen, senna-docusate  Allergies:   No Known Allergies  Social History:   Social History   Socioeconomic History  . Marital status: Single    Spouse name: Not on file  . Number of children: Not on file  . Years of education: Not on file  . Highest education level: Not on file  Occupational History  . Not on file  Social Needs  . Financial resource strain: Not on file  . Food insecurity:    Worry: Not on file    Inability: Not on file  . Transportation needs:    Medical: Not on file    Non-medical: Not on file  Tobacco Use  . Smoking status: Former Smoker    Packs/day: 1.00    Types: Cigarettes    Last attempt to quit: 04/17/2017    Years since quitting: 1.7  . Smokeless tobacco: Never Used  Substance and Sexual Activity  . Alcohol use: No  . Drug use: No  . Sexual activity: Not on file  Lifestyle  . Physical activity:    Days per week: Not on file    Minutes per session: Not on file  . Stress: Not on file  Relationships  . Social connections:    Talks on phone: Not on file    Gets together: Not on  file    Attends religious service: Not on file    Active member of club or organization: Not on file    Attends meetings of clubs or organizations: Not on file    Relationship status: Not on file  . Intimate partner violence:    Fear of current or ex partner: Not on file    Emotionally abused: Not on file    Physically abused: Not on file    Forced sexual activity: Not on file  Other Topics Concern  . Not on file  Social History Narrative   No place to live at this time, independent otherwise    Family History:    Family History  Family history unknown: Yes     ROS:  Please see the history of present illness.   Review of Systems  Constitutional: Positive for fever and malaise/fatigue.  Respiratory: Positive for  cough, sputum production and shortness of breath. Negative for hemoptysis.   Cardiovascular: Positive for chest pain and leg swelling. Negative for palpitations and orthopnea.  Gastrointestinal: Positive for nausea.  Genitourinary: Negative for dysuria, flank pain and hematuria.  Neurological: Negative for focal weakness and weakness.    All other ROS reviewed and negative.     Physical Exam/Data:   Vitals:   01/10/19 0530 01/10/19 0736 01/10/19 0934 01/10/19 1118  BP: 103/73 126/87 115/89 131/83  Pulse: (!) 103 94 (!) 102 (!) 102  Resp: 20 (!) 23 (!) 33 (!) 34  Temp:   98.6 F (37 C)   TempSrc:   Oral   SpO2: 94% 97% 94% 97%  Weight:      Height:        Intake/Output Summary (Last 24 hours) at 01/10/2019 1203 Last data filed at 01/10/2019 0706 Gross per 24 hour  Intake 558.18 ml  Output 400 ml  Net 158.18 ml   Filed Weights   01/09/19 1510  Weight: 113.4 kg   Body mass index is 29.65 kg/m.  General:  Well nourished, well developed. He is tachypeneaic with increased work of breath with even slight movement. HEENT: normal Neck: JVP ~6-8 cm. Vascular: Radial pulses 2+ bilaterally   Cardiac:  Tachycardic. normal S1, S2; no murmur Lungs: Tachypneic.  LUL and bibasilar crackles. Abd: soft, nontender, no hepatomegaly  Ext: +1 pitting edema bilaterally. Mild calf tenderness on palpation. No redness Musculoskeletal:  No deformities Skin: warm and dry  Neuro:  no focal abnormalities noted Psych:  Normal affect   EKG: Personally reviewed - Sinus tachycardia with LAFP and LVH.  Lateral T-wave inversions noted. Telemetry:  Telemetry was personally reviewed and demonstrates:  NSR and sinus tachycardia.  Brief wide-complex rhythm is most consistent with artifact and less likely NSVT.  No significant change from prior tracing on 11/25/2018.  CV Studies:   Relevant CV Studies: 2/18 TTE pending  Laboratory Data:  Chemistry Recent Labs  Lab 01/09/19 1742  NA 138  K 3.8  CL 108  CO2 24  GLUCOSE 69*  BUN 13  CREATININE 1.18  CALCIUM 8.5*  GFRNONAA >60  GFRAA >60  ANIONGAP 6    Recent Labs  Lab 01/09/19 1742  PROT 7.1  ALBUMIN 3.5  AST 29  ALT 19  ALKPHOS 61  BILITOT 1.5*   Hematology Recent Labs  Lab 01/09/19 1742 01/10/19 0939  WBC 4.0 3.4*  RBC 4.14* 4.32  HGB 13.0 13.5  HCT 41.1 42.7  MCV 99.3 98.8  MCH 31.4 31.3  MCHC 31.6 31.6  RDW 13.0 13.1  PLT 287 263   Cardiac Enzymes Recent Labs  Lab 01/09/19 2115 01/10/19 0050 01/10/19 0353 01/10/19 0632  TROPONINI 1.05* 0.78* 0.85* 0.86*   No results for input(s): TROPIPOC in the last 168 hours.  BNP Recent Labs  Lab 01/09/19 1742  BNP 1,169.0*    DDimer No results for input(s): DDIMER in the last 168 hours.  Radiology/Studies:  Dg Chest 2 View  Result Date: 01/09/2019 CLINICAL DATA:  Cough and shortness of breath EXAM: CHEST - 2 VIEW COMPARISON:  December 23, 2018 FINDINGS: There is a subtle area of increased opacity in the left upper lobe. The lungs elsewhere are clear. Heart remains enlarged with pulmonary vascularity normal. No adenopathy. No bone lesions. IMPRESSION: Subtle area of increased opacity in the left upper lobe concerning for early pneumonia.  Lungs elsewhere clear. Stable cardiomegaly. Followup PA and lateral chest radiographs recommended in 3-4 weeks  following trial of antibiotic therapy to ensure resolution and exclude underlying malignancy. Electronically Signed   By: Lowella Grip III M.D.   On: 01/09/2019 16:33    Assessment and Plan:   Elevated Troponin with Atypical Chest Pain - Patient troponin flat trending and peaked at 1.05 and is likely due to demand ischemia in setting to flu and tachycardia. He reports chronic edema and worsening orthopnea concerning for underlying heart failure.  His BNP is also notably elevated.  Echocardiogram is pending.  Will redose furosemide 20 mg IV x 1. - Currently chest pain free. His history and symptoms consistent with atypical chest pain, but he does have risk factors include smoking and uncontrolled diabetes. He will likely benefit from ischemia work up depending on the echo results. Will risk stratify with lipid panel and A1c. - Continue with ASA, Lipitor, Coreg, Lisinopril - Continue heparin infusion pending echocardiogram - I/O's, daily weights, cardiac monitoring - If LVEF normal, consider checking d-dimer vs. CTA chest to exclude PE.  Influenza B Positive with LUL opacity on CXR - management per IM  DM - Will reorder HgbA1c for risk stratification   HTN - Stable - Continue with Coreg and Lisinopril   For questions or updates, please contact [place] Please consult [email or web address] for contact info under [facility].  Signed, [name], MD  01/10/2019 12:03 PM

## 2019-01-10 NOTE — Progress Notes (Signed)
Patient admitted to 2A 259 from ED via wheelchair.  Oriented to room and surroundings.  Patient on heparin drip at 50ml/hr.

## 2019-01-10 NOTE — Clinical Social Work Note (Signed)
CSW received consult that patient does not have a PCP, case manager can assist with this, please consult case manager, [place] signing off.  Please reconsult if social work needs arise.  Jones Broom. [place], MSW, LCSW [phone]  01/10/2019 7:22 PM

## 2019-01-10 NOTE — Progress Notes (Signed)
[place] [facility] [place] NAME: Gary Bentley    MR#:  [id number]  DATE OF BIRTH:  11/02/1972  SUBJECTIVE: Patient is admitted last night for generalized weakness, cough found to have type B influenza, elevated troponins.  He denies chest pain but has lots of cough and phlegm.  CHIEF COMPLAINT:   Chief Complaint  Patient presents with  . Cough  . Generalized Body Aches    REVIEW OF SYSTEMS:   ROS CONSTITUTIONAL: No fever, has fatigue, generalized weakness.  EYES: No blurred or double vision.  EARS, NOSE, AND THROAT: No tinnitus or ear pain.  RESPIRATORY: No cough, shortness of breath, wheezing or hemoptysis.  CARDIOVASCULAR: No chest pain, orthopnea, edema.  GASTROINTESTINAL: No nausea, vomiting, diarrhea or abdominal pain.  GENITOURINARY: No dysuria, hematuria.  ENDOCRINE: No polyuria, nocturia,  HEMATOLOGY: No anemia, easy bruising or bleeding SKIN: No rash or lesion. MUSCULOSKELETAL: No joint pain or arthritis.   NEUROLOGIC: No tingling, numbness, weakness.  PSYCHIATRY: No anxiety or depression.   DRUG ALLERGIES:  No Known Allergies  VITALS:  Blood pressure 131/83, pulse (!) 102, temperature 98.6 F (37 C), temperature source Oral, resp. rate (!) 34, height 6\' 5"  (1.956 m), weight 113.4 kg, SpO2 97 %.  PHYSICAL EXAMINATION:  GENERAL:  48 y.o.-year-old patient lying in the bed with no acute distress.  EYES: Pupils equal, round, reactive to light and accommodation. No scleral icterus. Extraocular muscles intact.  HEENT: Head atraumatic, normocephalic. Oropharynx and nasopharynx clear.  NECK:  Supple, no jugular venous distention. No thyroid enlargement, no tenderness.  LUNGS: Normal breath sounds bilaterally, no wheezing, rales,rhonchi or crepitation. No use of accessory muscles of respiration.  CARDIOVASCULAR: S1, S2 tachycardic no murmurs, rubs, or gallops.  ABDOMEN: Soft, nontender, nondistended. Bowel sounds present. No  organomegaly or mass.  EXTREMITIES: No pedal edema, cyanosis, or clubbing.  NEUROLOGIC: Cranial nerves II through XII are intact. Muscle strength 5/5 in all extremities. Sensation intact. Gait not checked.  PSYCHIATRIC: The patient is alert and oriented x 3.  SKIN: No obvious rash, lesion, or ulcer.    LABORATORY PANEL:   CBC Recent Labs  Lab 01/10/19 0939  WBC 3.4*  HGB 13.5  HCT 42.7  PLT 263   ------------------------------------------------------------------------------------------------------------------  Chemistries  Recent Labs  Lab 01/09/19 1742 01/09/19 2115  NA 138  --   K 3.8  --   CL 108  --   CO2 24  --   GLUCOSE 69*  --   BUN 13  --   CREATININE 1.18  --   CALCIUM 8.5*  --   MG  --  1.7  AST 29  --   ALT 19  --   ALKPHOS 61  --   BILITOT 1.5*  --    ------------------------------------------------------------------------------------------------------------------  Cardiac Enzymes Recent Labs  Lab 01/10/19 0632  TROPONINI 0.86*   ------------------------------------------------------------------------------------------------------------------  RADIOLOGY:  Dg Chest 2 View  Result Date: 01/09/2019 CLINICAL DATA:  Cough and shortness of breath EXAM: CHEST - 2 VIEW COMPARISON:  December 23, 2018 FINDINGS: There is a subtle area of increased opacity in the left upper lobe. The lungs elsewhere are clear. Heart remains enlarged with pulmonary vascularity normal. No adenopathy. No bone lesions. IMPRESSION: Subtle area of increased opacity in the left upper lobe concerning for early pneumonia. Lungs elsewhere clear. Stable cardiomegaly. Followup PA and lateral chest radiographs recommended in 3-4 weeks following trial of antibiotic therapy to ensure resolution and exclude underlying malignancy. Electronically Signed  By: Lowella Grip III M.D.   On: 01/09/2019 16:33    EKG:   Orders placed or performed during the hospital encounter of 01/09/19  . ED  EKG  . ED EKG  . EKG 12-Lead  . EKG 12-Lead  . EKG 12-Lead  . EKG 12-Lead    ASSESSMENT AND PLAN:   48 year old male with multiple medical problems of essential hypertension, diabetes mellitus type 2 has been feeling cough, generalized weakness found to have type B influenza.  #1. viral illness secondary to type Binfluenza,; continue Tamiflu. 2.  Non-ST elevation MI with elevated troponins up to 0.86.  Patient is on heparin drip, denies any chest pain does have pleuritic chest pain due to cough.  Appreciate [facility] cardiology following, epic text message Dr. Saunders Revel. to see if patient can eat.  Continue aspirin, high intensity statins, heparin drip, start low-dose beta-blocker due to tachycardia as well. #3 diabetes mellitus type 2: Patient is n.p.o. so hold Lantus.  Continue sliding scale insulin with coverage.  All the records are reviewed and case discussed with Care Management/Social Workerr. Management plans discussed with the patient, family and they are in agreement.  CODE STATUS: Full code  TOTAL TIME TAKING CARE OF THIS PATIENT: 38 minutes.   POSSIBLE D/C IN 1-2DAYS, DEPENDING ON CLINICAL CONDITION.  More than 50% time spent in counseling, coordination of care Epifanio Lesches M.D on 01/10/2019 at 11:31 AM  Between 7am to 6pm - Pager - [phone]  After 6pm go to [email or web address] - password [email or web address] [facility] [place] [facility]  Office  [phone]  CC: Primary care physician; Patient, No Pcp Per   Note: This dictation was prepared with Dragon dictation along with smaller phrase technology. Any transcriptional errors that result from this process are unintentional.

## 2019-01-10 NOTE — ED Notes (Signed)
Cardiology to bedside. 

## 2019-01-10 NOTE — Progress Notes (Signed)
[facility]  Date: 01/10/19 Time: 5:21 PM  Echocardiogram performed this afternoon was personally reviewed.  It shows severe biventricular dysfunction.  I suspect many of his symptoms are due to acute decompensated systolic heart failure with superimposed influenza infection.  I advocate for continued diuresis.  Beta-blockers should be avoided in this setting.  Aggressive beta-blockade could precipitate cardiogenic shock and further rapid decompensation.  I would have a low threshold for placing the patient on milrinone.  I have checked a basic metabolic panel and lactate level.  If lactate is elevated or renal function has worsened, milrinone will need to be initiated and the patient admitted to the ICU.  [name], MD [facility] Pager: [phone]

## 2019-01-10 NOTE — Progress Notes (Addendum)
Spoke with Dr. Saunders Revel,, no plans for cardiac cath troponin elevation likely demand ischemia, will start the patient on diet.

## 2019-01-10 NOTE — ED Notes (Signed)
Hospitalist to bedside.

## 2019-01-10 NOTE — Progress Notes (Signed)
ANTICOAGULATION CONSULT NOTE - Initial Consult  Pharmacy Consult for heparin drip Indication: chest pain/ACS  No Known Allergies  Patient Measurements: Height: 6\' 5"  (195.6 cm) Weight: 250 lb (113.4 kg) IBW/kg (Calculated) : 89.1 Heparin Dosing Weight: 112 kg  Vital Signs: Temp: 98.6 F (37 C) (02/18 0934) Temp Source: Oral (02/18 0934) BP: 132/93 (02/18 1341) Pulse Rate: 97 (02/18 1341)  Labs: Recent Labs    01/09/19 1742  01/10/19 0050 01/10/19 0353 01/10/19 0632 01/10/19 0939  HGB 13.0  --   --   --   --  13.5  HCT 41.1  --   --   --   --  42.7  PLT 287  --   --   --   --  263  APTT  --   --  35  --   --   --   LABPROT  --   --  14.4  --   --   --   INR  --   --  1.13  --   --   --   HEPARINUNFRC  --   --   --   --   --  0.40  CREATININE 1.18  --   --   --   --   --   TROPONINI  --    < > 0.78* 0.85* 0.86*  --    < > = values in this interval not displayed.    Estimated Creatinine Clearance: 110.5 mL/min (by C-G formula based on SCr of 1.18 mg/dL).   Medical History: Past Medical History:  Diagnosis Date  . Diabetes mellitus without complication (HCC)     Medications:  No anticoag in PTA meds.  Assessment: 48 year old male with multiple medical problems of essential hypertension, diabetes mellitus type 2 has been feeling cough, generalized weakness found to have type B influenza. Initial troponin 1.05, which is now trending down. CBC wnl  Goal of Therapy:  Heparin level 0.3-0.7 units/ml Monitor platelets by anticoagulation protocol: Yes   Heparin Course: 2/18 AM initiation: 4000 unit bolus, then 1500 units/hr 2/18 0939 HL 0.40 2/18 1715 HL<0.10   Plan:  There is no evidence that the heparin infusion has been interupted since the previous level. We will use a 3000 unit bolus and increase the infusion rate to 1800 units/hr. Next heparin level 6 hours after changes to infusion.  Vallery Sa, PharmD 01/10/19 2:17 PM

## 2019-01-11 DIAGNOSIS — I248 Other forms of acute ischemic heart disease: Secondary | ICD-10-CM

## 2019-01-11 LAB — BASIC METABOLIC PANEL
Anion gap: 8 (ref 5–15)
BUN: 19 mg/dL (ref 6–20)
CO2: 22 mmol/L (ref 22–32)
Calcium: 7.8 mg/dL — ABNORMAL LOW (ref 8.9–10.3)
Chloride: 105 mmol/L (ref 98–111)
Creatinine, Ser: 1.12 mg/dL (ref 0.61–1.24)
GFR calc Af Amer: >60 mL/min (ref >60)
GFR calc non Af Amer: >60 mL/min (ref >60)
Glucose, Bld: 138 mg/dL — ABNORMAL HIGH (ref 70–99)
Potassium: 3.8 mmol/L (ref 3.5–5.1)
Sodium: 135 mmol/L (ref 135–145)

## 2019-01-11 LAB — GLUCOSE, CAPILLARY
GLUCOSE-CAPILLARY: 102 mg/dL — AB (ref 70–99)
GLUCOSE-CAPILLARY: 132 mg/dL — AB (ref 70–99)
Glucose-Capillary: 108 mg/dL — ABNORMAL HIGH (ref 70–99)
Glucose-Capillary: 103 mg/dL — ABNORMAL HIGH (ref 70–99)

## 2019-01-11 LAB — LEGIONELLA PNEUMOPHILA SEROGP 1 UR AG: L. PNEUMOPHILA SEROGP 1 UR AG: NEGATIVE

## 2019-01-11 LAB — CBC
HCT: 38.2 % — ABNORMAL LOW (ref 39.0–52.0)
Hemoglobin: 12.1 g/dL — ABNORMAL LOW (ref 13.0–17.0)
MCH: 31.3 pg (ref 26.0–34.0)
MCHC: 31.7 g/dL (ref 30.0–36.0)
MCV: 98.7 fL (ref 80.0–100.0)
Platelets: 221 10*3/uL (ref 150–400)
RBC: 3.87 MIL/uL — ABNORMAL LOW (ref 4.22–5.81)
RDW: 13.2 % (ref 11.5–15.5)
WBC: 3.4 10*3/uL — ABNORMAL LOW (ref 4.0–10.5)
nRBC: 0 % (ref 0.0–0.2)

## 2019-01-11 LAB — HEPARIN LEVEL (UNFRACTIONATED): Heparin Unfractionated: 1.34 IU/mL — ABNORMAL HIGH (ref 0.30–0.70)

## 2019-01-11 LAB — CALCIUM, IONIZED: Calcium, Ionized, Serum: 4.6 mg/dL (ref 4.5–5.6)

## 2019-01-11 MED ORDER — ENSURE ENLIVE PO LIQD
237.0000 mL | Freq: Two times a day (BID) | ORAL | Status: DC
  Administered 2019-01-11 – 2019-01-14 (×4): 237 mL via ORAL

## 2019-01-11 MED ORDER — SPIRONOLACTONE 25 MG PO TABS
12.5000 mg | ORAL_TABLET | Freq: Every day | ORAL | Status: DC
  Administered 2019-01-11 – 2019-01-15 (×5): 12.5 mg via ORAL
  Filled 2019-01-11 (×2): qty 0.5
  Filled 2019-01-11 (×2): qty 1
  Filled 2019-01-11: qty 0.5
  Filled 2019-01-11 (×2): qty 1
  Filled 2019-01-11 (×2): qty 0.5
  Filled 2019-01-11: qty 1

## 2019-01-11 MED ORDER — FUROSEMIDE 10 MG/ML IJ SOLN
40.0000 mg | Freq: Two times a day (BID) | INTRAMUSCULAR | Status: DC
  Administered 2019-01-11 – 2019-01-12 (×3): 40 mg via INTRAVENOUS
  Filled 2019-01-11 (×3): qty 4

## 2019-01-11 NOTE — Progress Notes (Addendum)
This RN spoke to pharmacy.  Due to heparin levels, IV heparin drip will be held until 0900 and then restarted at 74ml/hr.

## 2019-01-11 NOTE — Care Management Note (Addendum)
Case Management Note  Patient Details  Name: Gary Bentley MRN: [id number] Date of Birth: 11/02/1972  Subjective/Objective:      Patient is from home alone.  Admitted with chest pain, Flu B positive.  New CHF; bilateral lower extremity edema.  Obtains medications at [facility] without difficulty.  Does not have a PCP; [facility] PCP referral number provided to patient to establish.  Has a HF clinic appointment scheduled.  Patient most likely will not qualify for home health services as he is independent and has a full time job.              Action/Plan:   Expected Discharge Date:                  Expected Discharge Plan:  Home/Self Care  In-House Referral:     Discharge planning Services  CM Consult  Post Acute Care Choice:    Choice offered to:     DME Arranged:    DME Agency:     HH Arranged:    HH Agency:     Status of Service:  In process, will continue to follow  If discussed at Long Length of Stay Meetings, dates discussed:    Additional Comments:  Elza Rafter, RN 01/11/2019, 3:21 PM

## 2019-01-11 NOTE — Progress Notes (Signed)
ANTICOAGULATION CONSULT NOTE - Initial Consult  Pharmacy Consult for heparin drip Indication: chest pain/ACS  No Known Allergies  Patient Measurements: Height: 6\' 5"  (195.6 cm) Weight: 253 lb 1.6 oz (114.8 kg) IBW/kg (Calculated) : 89.1 Heparin Dosing Weight: 112 kg  Vital Signs: Temp: 100.1 F (37.8 C) (02/19 0614) Temp Source: Oral (02/19 0614) BP: 103/67 (02/19 0614) Pulse Rate: 90 (02/19 0614)  Labs: Recent Labs    01/09/19 1742  01/10/19 0050 01/10/19 0353 01/10/19 2952 01/10/19 0939 01/10/19 1715 01/11/19 0511  HGB 13.0  --   --   --   --  13.5  --  12.1*  HCT 41.1  --   --   --   --  42.7  --  38.2*  PLT 287  --   --   --   --  263  --  221  APTT  --   --  35  --   --   --   --   --   LABPROT  --   --  14.4  --   --   --   --   --   INR  --   --  1.13  --   --   --   --   --   HEPARINUNFRC  --   --   --   --   --  0.40 <0.10* 1.34*  CREATININE 1.18  --   --   --   --   --  1.31* 1.12  TROPONINI  --    < > 0.78* 0.85* 0.86*  --   --   --    < > = values in this interval not displayed.    Estimated Creatinine Clearance: 117.1 mL/min (by C-G formula based on SCr of 1.12 mg/dL).   Medical History: Past Medical History:  Diagnosis Date  . Diabetes mellitus without complication (HCC)     Medications:  No anticoag in PTA meds.  Assessment: 48 year old male with multiple medical problems of essential hypertension, diabetes mellitus type 2 has been feeling cough, generalized weakness found to have type B influenza. Initial troponin 1.05, which is now trending down. CBC wnl  Goal of Therapy:  Heparin level 0.3-0.7 units/ml Monitor platelets by anticoagulation protocol: Yes   Heparin Course: 2/18 AM initiation: 4000 unit bolus, then 1500 units/hr 2/18 0939 HL 0.40 2/18 1715 HL<0.10 - 3000 unit bolus and rate changed to 1800 units/hr 2/19 0511 HL 1.34    Plan:  There is no evidence that the heparin lab draw was drawn from the incorrect arm (followed up  with phlebotomist who confirmed draw correctly) - Will hold infusion for 1 hour and restart at lower rate of 1600 units/hour.  Will draw follow-up HL 6 hours after restart per protocol.  Lu Duffel, PharmD, BCPS Clinical Pharmacist 01/11/2019 7:47 AM

## 2019-01-11 NOTE — Progress Notes (Signed)
Progress Note  Patient Name: Gary Bentley Date of Encounter: 01/11/2019  Primary Cardiologist: New - Dr. Saunders Gary Bentley  Subjective   Patient feeling better overall. States that his breathing better today, but still complains of some shortness of breath and cough. Reports good urine output yesterday as well. Currently chest pain free. Denies any heart racing sensations or palpitations. Also denies any chest pains or heart attacks in the past, any family history of heart failure or sudden cardiac death, or increased life stressors.   Inpatient Medications    Scheduled Meds: . aspirin EC  81 mg Oral Daily  . atorvastatin  40 mg Oral q1800  . furosemide  40 mg Intravenous BID  . insulin aspart  0-9 Units Subcutaneous TID WC  . lisinopril  10 mg Oral Daily  . oseltamivir  75 mg Oral BID  . sodium chloride flush  3 mL Intravenous Q12H  . spironolactone  12.5 mg Oral Daily   Continuous Infusions:  PRN Meds: acetaminophen **OR** acetaminophen, albuterol, bisacodyl, dextrose, morphine injection **OR** morphine injection, nitroGLYCERIN, ondansetron **OR** ondansetron (ZOFRAN) IV, oxyCODONE-acetaminophen, senna-docusate   Vital Signs    Vitals:   01/10/19 1938 01/11/19 0349 01/11/19 0614 01/11/19 0849  BP: 105/70 (!) 121/93 103/67 122/78  Pulse: 88 97 90 82  Resp: 18 16  20   Temp: 100.3 F (37.9 C) (!) 101.2 F (38.4 C) 100.1 F (37.8 C) 98.6 F (37 C)  TempSrc: Oral Oral Oral Oral  SpO2: 100% 92%  100%  Weight:  114.8 kg    Height:        Intake/Output Summary (Last 24 hours) at 01/11/2019 0942 Last data filed at 01/11/2019 8341 Gross per 24 hour  Intake 451.84 ml  Output 1850 ml  Net -1398.16 ml   Filed Weights   01/09/19 1510 01/10/19 1700 01/11/19 0349  Weight: 113.4 kg 116.8 kg 114.8 kg    Telemetry    NSR - Personally Reviewed  ECG    N/a - Personally Reviewed  Physical Exam   GEN: He is tachypnic with increased respiratory effort with slight movement Neck:  JVP ~8 cm with +HJR Cardiac: tachycardic, RRR, 1/6 systolic murmur, rubs, or gallops.  Respiratory: Tachypnic. Right anterior expiratory wheezes.  GI: Soft, nontender, non-distended  MS: +2 pitting edema bilaterally. Neuro:  Nonfocal  Psych: Normal affect   Labs    Chemistry Recent Labs  Lab 01/09/19 1742 01/10/19 1715 01/11/19 0511  NA 138 134* 135  K 3.8 3.7 3.8  CL 108 104 105  CO2 24 25 22   GLUCOSE 69* 123* 138*  BUN 13 17 19   CREATININE 1.18 1.31* 1.12  CALCIUM 8.5* 8.0* 7.8*  PROT 7.1  --   --   ALBUMIN 3.5  --   --   AST 29  --   --   ALT 19  --   --   ALKPHOS 61  --   --   BILITOT 1.5*  --   --   GFRNONAA >60 >60 >60  GFRAA >60 >60 >60  ANIONGAP 6 5 8      Hematology Recent Labs  Lab 01/09/19 1742 01/10/19 0939 01/11/19 0511  WBC 4.0 3.4* 3.4*  RBC 4.14* 4.32 3.87*  HGB 13.0 13.5 12.1*  HCT 41.1 42.7 38.2*  MCV 99.3 98.8 98.7  MCH 31.4 31.3 31.3  MCHC 31.6 31.6 31.7  RDW 13.0 13.1 13.2  PLT 287 263 221    Cardiac Enzymes Recent Labs  Lab 01/09/19 2115 01/10/19 0050 01/10/19  8756 01/10/19 0632  TROPONINI 1.05* 0.78* 0.85* 0.86*   No results for input(s): TROPIPOC in the last 168 hours.   BNP Recent Labs  Lab 01/09/19 1742  BNP 1,169.0*     DDimer No results for input(s): DDIMER in the last 168 hours.   Radiology    Dg Chest 2 View  Result Date: 01/09/2019 CLINICAL DATA:  Cough and shortness of breath EXAM: CHEST - 2 VIEW COMPARISON:  December 23, 2018 FINDINGS: There is a subtle area of increased opacity in the left upper lobe. The lungs elsewhere are clear. Heart remains enlarged with pulmonary vascularity normal. No adenopathy. No bone lesions. IMPRESSION: Subtle area of increased opacity in the left upper lobe concerning for early pneumonia. Lungs elsewhere clear. Stable cardiomegaly. Followup PA and lateral chest radiographs recommended in 3-4 weeks following trial of antibiotic therapy to ensure resolution and exclude underlying  malignancy. Electronically Signed   By: Lowella Grip III M.D.   On: 01/09/2019 16:33    Cardiac Studies   2/18 TTE 1. The left ventricle has a visually estimated ejection fraction of of 15-20%. The cavity size was mildly dilated. There is moderately increased left ventricular wall thickness. Left ventricular diastology could not be evaluated Left ventricular  diffuse hypokinesis.  2. Left ventricular systolic function is severely reduced.  3. The right ventricle has severely reduced systolic function. The cavity was moderately enlarged. There is no increase in right ventricular wall thickness.  4. Left atrial size was severely dilated.  5. Right atrial size was moderately dilated.  Patient Profile     48 y.o. male with a hx of DM and pneumonia x3 in the past 2 months, admitted with influenza infection and found to have acute systolic heart failure.  Assessment & Plan    Acute HFrEF - Echocardiogram with severely reduced LV function. I suspect many of patient symptoms are due to acute decompensated HFrEF with superimposed influenza infection. Etiology of HFrEF unclear.Patient may need left heart cath once his breathing status is optimized and his infection is relatively resolved to rule in/out any obstructive disease.  - Patient seemed mildly fluid overloaded today, with pitting edema worse compared to yesterday. Cr today is 1.12, better compared to yesterday with diuretics. Will start patient on Lasix 40 mg IV BID as well as potassium supplementation. I will also add spironolactone 12.5 mg daily.  We will defer addition of a beta blocker today, as he still appears somewhat tenuous and may need inotropic support if he declines.  Daily Bmets to monitor potassium and renal function.  - Continue with Lisinopril 10 mg daily - I/O's, daily weights, cardiac monitoring  Elevated Troponin with Atypical Chest Pain - Patient troponin flat trending and peaked at 1.05 and is likely due to demand  ischemia in setting to flu decompensated HFrEF.  - Currently chest pain free. His history and symptoms consistent with atypical chest pain, but he does have risk factors include smoking and uncontrolled diabetes. He will likely benefit from ischemia work as above. - Continue with ASA, Lipitor, Lisinopril. Hold BB due the above. - Discontinue heparin, as I do not believe this represents ACS.  Influenza B Positive - management per IM  HTN - Stable - Continue with Coreg and Lisinopril  For questions or updates, please contact Antoine Please consult [email or web address] for contact info under [facility].  Signed, [name], MD  01/11/2019, 9:42 AM

## 2019-01-11 NOTE — Progress Notes (Signed)
[facility] at [place] NAME: Gary Bentley    MR#:  [id number]  DATE OF BIRTH:  11/02/1972  SUBJECTIVE: he is admitted for chest pain, shortness of breath and found to have influenza type B infection, now on Tamiflu, echocardiogram showed EF 15 to 20%.  Complains of mild shortness of breath patient had fever up to 101.2 Fahrenheit this morning, hypoxic with O2 saturation 92% on room air.  Still feels really tired.  CHIEF COMPLAINT:   Chief Complaint  Patient presents with  . Cough  . Generalized Body Aches    REVIEW OF SYSTEMS:   ROS CONSTITUTIONAL: Fever this morning, has fatigue, generalized weakness EYES: No blurred or double vision.  EARS, NOSE, AND THROAT: No tinnitus or ear pain.  RESPIRATORY: Has some cough, shortness of breath.  CARDIOVASCULAR: No chest pain, orthopnea, edema.  GASTROINTESTINAL: No nausea, vomiting, diarrhea or abdominal pain.  GENITOURINARY: No dysuria, hematuria.  ENDOCRINE: No polyuria, nocturia,  HEMATOLOGY: No anemia, easy bruising or bleeding SKIN: No rash or lesion. MUSCULOSKELETAL: No joint pain or arthritis.   NEUROLOGIC: No tingling, numbness, weakness.  PSYCHIATRY: No anxiety or depression.   DRUG ALLERGIES:  No Known Allergies  VITALS:  Blood pressure 122/78, pulse 82, temperature 98.6 F (37 C), temperature source Oral, resp. rate 20, height 6\' 5"  (1.956 m), weight 114.8 kg, SpO2 100 %.  PHYSICAL EXAMINATION:  GENERAL:  48 y.o.-year-old patient lying in the bed with no acute distress.  EYES: Pupils equal, round, reactive to light and accommodation. No scleral icterus. Extraocular muscles intact.  HEENT: Head atraumatic, normocephalic. Oropharynx and nasopharynx clear.  NECK:  Supple, no jugular venous distention. No thyroid enlargement, no tenderness.  LUNGS:  mostly clear to auscultation diminished at the bases CARDIOVASCULAR: S1, S2 tachycardic no murmurs, rubs, or gallops.  ABDOMEN:  Soft, nontender, nondistended. Bowel sounds present. No organomegaly or mass.  EXTREMITIES: No pedal edema, cyanosis, or clubbing.  NEUROLOGIC: Cranial nerves II through XII are intact. Muscle strength 5/5 in all extremities. Sensation intact. Gait not checked.  PSYCHIATRIC: The patient is alert and oriented x 3.  SKIN: No obvious rash, lesion, or ulcer.    LABORATORY PANEL:   CBC Recent Labs  Lab 01/11/19 0511  WBC 3.4*  HGB 12.1*  HCT 38.2*  PLT 221   ------------------------------------------------------------------------------------------------------------------  Chemistries  Recent Labs  Lab 01/09/19 1742 01/09/19 2115  01/11/19 0511  NA 138  --    < > 135  K 3.8  --    < > 3.8  CL 108  --    < > 105  CO2 24  --    < > 22  GLUCOSE 69*  --    < > 138*  BUN 13  --    < > 19  CREATININE 1.18  --    < > 1.12  CALCIUM 8.5*  --    < > 7.8*  MG  --  1.7  --   --   AST 29  --   --   --   ALT 19  --   --   --   ALKPHOS 61  --   --   --   BILITOT 1.5*  --   --   --    < > = values in this interval not displayed.   ------------------------------------------------------------------------------------------------------------------  Cardiac Enzymes Recent Labs  Lab 01/10/19 0632  TROPONINI 0.86*   ------------------------------------------------------------------------------------------------------------------  RADIOLOGY:  Dg Chest 2  View  Result Date: 01/09/2019 CLINICAL DATA:  Cough and shortness of breath EXAM: CHEST - 2 VIEW COMPARISON:  December 23, 2018 FINDINGS: There is a subtle area of increased opacity in the left upper lobe. The lungs elsewhere are clear. Heart remains enlarged with pulmonary vascularity normal. No adenopathy. No bone lesions. IMPRESSION: Subtle area of increased opacity in the left upper lobe concerning for early pneumonia. Lungs elsewhere clear. Stable cardiomegaly. Followup PA and lateral chest radiographs recommended in 3-4 weeks following  trial of antibiotic therapy to ensure resolution and exclude underlying malignancy. Electronically Signed   By: Lowella Grip III M.D.   On: 01/09/2019 16:33    EKG:   Orders placed or performed during the hospital encounter of 01/09/19  . ED EKG  . ED EKG  . EKG 12-Lead  . EKG 12-Lead  . EKG 12-Lead  . EKG 12-Lead  . EKG    ASSESSMENT AND PLAN:   48 year old male with multiple medical problems of essential hypertension, diabetes mellitus type 2 has been feeling cough, generalized weakness found to have type B influenza.  #1. viral illness secondary to type Binfluenza,; continue Tamiflu. 2.  Non-ST elevation MI with elevated troponins up to 0.86.   Echocardiogram showed severely decreased LV function with EF 15 to 20%.  Needs cardiac cath once patient breathing status improves.  Continue aspirin, statins, ACE inhibitor, discontinue the heparin drip by cardiology.  #3 .acute heart failure with reduced ejection fraction, severely reduced LV function with EF 15 to 20%, mildly volume overloaded today morning with hypoxia, seen by cardiology started on IV Lasix, also added spironolactone.  Avoid beta-blocker because of acute heart failure, started on lisinopril 10 mg daily. #4 diabetes mellitus type 2: Patient was taking 70/30 insulin and also metformin.  Start Lantus while in the hospital. All the records are reviewed and case discussed with Care Management/Social Workerr. Management plans discussed with the patient, family and they are in agreement.  CODE STATUS: Full code  TOTAL TIME TAKING CARE OF THIS PATIENT: 38 minutes.   POSSIBLE D/C IN 1-2DAYS, DEPENDING ON CLINICAL CONDITION.  More than 50% time spent in counseling, coordination of care Epifanio Lesches M.D on 01/11/2019 at 12:39 PM  Between 7am to 6pm - Pager - [phone]  After 6pm go to [email or web address] - password [email or web address] [facility]  Office  [phone]  CC: Primary care physician;  Patient, No Pcp Per   Note: This dictation was prepared with Dragon dictation along with smaller phrase technology. Any transcriptional errors that result from this process are unintentional.

## 2019-01-11 NOTE — Plan of Care (Signed)
Febrile  this shift.  Tylenol prn given as ordered.  Reports unable to lie in bed.  Slept at short intervals, sitting on side of bed most of shift. Declines to rest in recliner.

## 2019-01-11 NOTE — Progress Notes (Signed)
Initial Nutrition Assessment  DOCUMENTATION CODES:   Not applicable  INTERVENTION:   Ensure Enlive po BID, each supplement provides 350 kcal and 20 grams of protein  MVI daily  Liberalize diet   NUTRITION DIAGNOSIS:   Inadequate oral intake related to acute illness as evidenced by meal completion < 25%.  GOAL:   Patient will meet greater than or equal to 90% of their needs  MONITOR:   PO intake, Supplement acceptance, Labs, Weight trends, Skin, I & O's  REASON FOR ASSESSMENT:   Consult Diet education  ASSESSMENT:   48 year old male with multiple medical problems of essential hypertension, diabetes mellitus type 2 has been feeling cough, generalized weakness found to have type B influenza.   Met with pt in room today. Pt reports poor appetite and oral intake for several days pta. Pt reports his appetite remains poor today; pt eating <25% of meals. Per chart, pt is weight stable pta. Pt is willing to drink strawberry Ensure until his appetite improves. RD will also liberalize pt's diet to encourage increased po intake.  RD provided "Heart Healthy Nutrition Therapy" handout from the Academy of [employer]. Reviewed patient's dietary recall. Provided examples on ways to decrease sodium and fat intake in diet. Discouraged intake of processed foods and use of salt shaker. Encouraged fresh fruits and vegetables as well as whole grain sources of carbohydrates to maximize fiber intake. Teach back method used.  Medications reviewed and include: aspirin, lasix, insulin, aldactone  Labs reviewed: K 3.4(L)  NUTRITION - FOCUSED PHYSICAL EXAM:    Most Recent Value  Orbital Region  No depletion  Upper Arm Region  No depletion  Thoracic and Lumbar Region  No depletion  Buccal Region  No depletion  Temple Region  No depletion  Clavicle Bone Region  No depletion  Clavicle and Acromion Bone Region  No depletion  Scapular Bone Region  No depletion  Dorsal Hand  No  depletion  Patellar Region  No depletion  Anterior Thigh Region  No depletion  Posterior Calf Region  No depletion  Edema (RD Assessment)  Mild  Hair  Reviewed  Eyes  Reviewed  Mouth  Reviewed  Skin  Reviewed  Nails  Reviewed     Diet Order:   Diet Order            Diet Carb Modified Fluid consistency: Thin; Room service appropriate? Yes  Diet effective now             EDUCATION NEEDS:   Education needs have been addressed  Skin:  Skin Assessment: Reviewed RN Assessment  Last BM:  PTA  Height:   Ht Readings from Last 1 Encounters:  01/10/19 '6\' 5"'$  (1.956 m)    Weight:   Wt Readings from Last 1 Encounters:  01/11/19 114.8 kg    Ideal Body Weight:  94.5 kg  BMI:  Body mass index is 30.01 kg/m.  Estimated Nutritional Needs:   Kcal:  2700-3000kcal/day   Protein:  115-127g/day   Fluid:  2.8L/day   Koleen Distance MS, RD, LDN Pager #- [phone] Office#- [phone] After Hours Pager: [phone]

## 2019-01-12 LAB — BASIC METABOLIC PANEL
Anion gap: 8 (ref 5–15)
BUN: 17 mg/dL (ref 6–20)
CALCIUM: 7.7 mg/dL — AB (ref 8.9–10.3)
CO2: 23 mmol/L (ref 22–32)
Chloride: 104 mmol/L (ref 98–111)
Creatinine, Ser: 1.03 mg/dL (ref 0.61–1.24)
GFR calc non Af Amer: >60 mL/min (ref >60)
Glucose, Bld: 118 mg/dL — ABNORMAL HIGH (ref 70–99)
Potassium: 3.5 mmol/L (ref 3.5–5.1)
Sodium: 135 mmol/L (ref 135–145)

## 2019-01-12 LAB — GLUCOSE, CAPILLARY
Glucose-Capillary: 113 mg/dL — ABNORMAL HIGH (ref 70–99)
Glucose-Capillary: 131 mg/dL — ABNORMAL HIGH (ref 70–99)
Glucose-Capillary: 112 mg/dL — ABNORMAL HIGH (ref 70–99)

## 2019-01-12 MED ORDER — GUAIFENESIN-DM 100-10 MG/5ML PO SYRP
5.0000 mL | ORAL_SOLUTION | ORAL | Status: DC | PRN
  Administered 2019-01-12: 5 mL via ORAL
  Filled 2019-01-12 (×3): qty 5

## 2019-01-12 MED ORDER — ENOXAPARIN SODIUM 40 MG/0.4ML ~~LOC~~ SOLN
40.0000 mg | SUBCUTANEOUS | Status: DC
  Administered 2019-01-12 – 2019-01-14 (×3): 40 mg via SUBCUTANEOUS
  Filled 2019-01-12 (×3): qty 0.4

## 2019-01-12 MED ORDER — POLYETHYLENE GLYCOL 3350 17 G PO PACK: 17.0000 g | PACK | Freq: Every day | ORAL | Status: DC

## 2019-01-12 MED ORDER — METOPROLOL SUCCINATE ER 25 MG PO TB24
12.5000 mg | ORAL_TABLET | Freq: Every day | ORAL | Status: DC
  Administered 2019-01-12 – 2019-01-15 (×3): 12.5 mg via ORAL
  Filled 2019-01-12 (×4): qty 1

## 2019-01-12 NOTE — Progress Notes (Signed)
[facility] at [place] NAME: Gary Bentley    MR#:  [id number]  DATE OF BIRTH:  11/02/1972  SUBJECTIVE:  Patient without complaint, noted fever overnight, cardiology input appreciated-for possible heart catheterization in the next 1 to 2 days   REVIEW OF SYSTEMS:  CONSTITUTIONAL: No fever, fatigue or weakness.  EYES: No blurred or double vision.  EARS, NOSE, AND THROAT: No tinnitus or ear pain.  RESPIRATORY: No cough, shortness of breath, wheezing or hemoptysis.  CARDIOVASCULAR: No chest pain, orthopnea, edema.  GASTROINTESTINAL: No nausea, vomiting, diarrhea or abdominal pain.  GENITOURINARY: No dysuria, hematuria.  ENDOCRINE: No polyuria, nocturia,  HEMATOLOGY: No anemia, easy bruising or bleeding SKIN: No rash or lesion. MUSCULOSKELETAL: No joint pain or arthritis.   NEUROLOGIC: No tingling, numbness, weakness.  PSYCHIATRY: No anxiety or depression.   ROS  DRUG ALLERGIES:  No Known Allergies  VITALS:  Blood pressure 107/79, pulse 83, temperature 98.9 F (37.2 C), temperature source Oral, resp. rate 18, height 6\' 5"  (1.956 m), weight 110.6 kg, SpO2 95 %.  PHYSICAL EXAMINATION:  GENERAL:  48 y.o.-year-old patient lying in the bed with no acute distress.  EYES: Pupils equal, round, reactive to light and accommodation. No scleral icterus. Extraocular muscles intact.  HEENT: Head atraumatic, normocephalic. Oropharynx and nasopharynx clear.  NECK:  Supple, no jugular venous distention. No thyroid enlargement, no tenderness.  LUNGS: Normal breath sounds bilaterally, no wheezing, rales,rhonchi or crepitation. No use of accessory muscles of respiration.  CARDIOVASCULAR: S1, S2 normal. No murmurs, rubs, or gallops.  ABDOMEN: Soft, nontender, nondistended. Bowel sounds present. No organomegaly or mass.  EXTREMITIES: No pedal edema, cyanosis, or clubbing.  NEUROLOGIC: Cranial nerves II through XII are intact. Muscle strength 5/5 in all  extremities. Sensation intact. Gait not checked.  PSYCHIATRIC: The patient is alert and oriented x 3.  SKIN: No obvious rash, lesion, or ulcer.   Physical Exam LABORATORY PANEL:   CBC Recent Labs  Lab 01/11/19 0511  WBC 3.4*  HGB 12.1*  HCT 38.2*  PLT 221   ------------------------------------------------------------------------------------------------------------------  Chemistries  Recent Labs  Lab 01/09/19 1742 01/09/19 2115  01/12/19 0721  NA 138  --    < > 135  K 3.8  --    < > 3.5  CL 108  --    < > 104  CO2 24  --    < > 23  GLUCOSE 69*  --    < > 118*  BUN 13  --    < > 17  CREATININE 1.18  --    < > 1.03  CALCIUM 8.5*  --    < > 7.7*  MG  --  1.7  --   --   AST 29  --   --   --   ALT 19  --   --   --   ALKPHOS 61  --   --   --   BILITOT 1.5*  --   --   --    < > = values in this interval not displayed.   ------------------------------------------------------------------------------------------------------------------  Cardiac Enzymes Recent Labs  Lab 01/10/19 0353 01/10/19 0632  TROPONINI 0.85* 0.86*   ------------------------------------------------------------------------------------------------------------------  RADIOLOGY:  No results found.  ASSESSMENT AND PLAN:  48 year old male with multiple medical problems of essential hypertension, diabetes mellitus type 2 has been feeling cough, generalized weakness found to have type B influenza.  *Acute influenza B infection Resolving Continue Tamiflu, supportive care  *Acute non-ST  elevation MI  Stable  Echocardiogram showed severely decreased LV function with EF 15 to 20%.  Per cardiology/Dr. END -for possible heart catheterization for further evaluation prior to discharge Continue aspirin, statin rx, ACE inhibitor  *acute heart failure with reduced ejection fraction Improving severely reduced LV function with EF 15 to 20% Continue Lasix, spironolactone, lisinopril  *Chronic diabetes  mellitus type 2 Controlled on current regiment   Discharged home once cleared by cardiology  All the records are reviewed and case discussed with Care Management/Social Workerr. Management plans discussed with the patient, family and they are in agreement.  CODE STATUS: full  TOTAL TIME TAKING CARE OF THIS PATIENT: 35 minutes.     POSSIBLE D/C IN 1-2 DAYS, DEPENDING ON CLINICAL CONDITION.   Avel Peace  M.D on 01/12/2019   Between 7am to 6pm - Pager - [phone]  After 6pm go to [email or web address] - password [email or web address] [facility]  Office  [phone]  CC: Primary care physician; Patient, No Pcp Per  Note: This dictation was prepared with Dragon dictation along with smaller phrase technology. Any transcriptional errors that result from this process are unintentional.

## 2019-01-12 NOTE — Progress Notes (Signed)
Progress Note  Patient Name: Gary Bentley Date of Encounter: 01/12/2019  Primary Cardiologist: [name]  Subjective   Feeling better, complaining only of myalgias.  Breathing improved and almost back to baseline.  No CP or palpitations.  Able to lie flat without difficulty.  Inpatient Medications    Scheduled Meds: . aspirin EC  81 mg Oral Daily  . atorvastatin  40 mg Oral q1800  . enoxaparin (LOVENOX) injection  40 mg Subcutaneous Q24H  . feeding supplement (ENSURE ENLIVE)  237 mL Oral BID BM  . insulin aspart  0-9 Units Subcutaneous TID WC  . lisinopril  10 mg Oral Daily  . oseltamivir  75 mg Oral BID  . sodium chloride flush  3 mL Intravenous Q12H  . spironolactone  12.5 mg Oral Daily   Continuous Infusions:  PRN Meds: acetaminophen **OR** acetaminophen, albuterol, bisacodyl, dextrose, morphine injection **OR** morphine injection, nitroGLYCERIN, ondansetron **OR** ondansetron (ZOFRAN) IV, oxyCODONE-acetaminophen, senna-docusate   Vital Signs    Vitals:   01/11/19 2216 01/12/19 0539 01/12/19 0543 01/12/19 0746  BP:   112/80 107/79  Pulse:   90 83  Resp:   18 18  Temp: 99.3 F (37.4 C)  99.1 F (37.3 C) 98.9 F (37.2 C)  TempSrc: Oral  Oral Oral  SpO2:   93% 95%  Weight:  110.6 kg    Height:        Intake/Output Summary (Last 24 hours) at 01/12/2019 1414 Last data filed at 01/12/2019 1201 Gross per 24 hour  Intake -  Output 2900 ml  Net -2900 ml   Filed Weights   01/10/19 1700 01/11/19 0349 01/12/19 0539  Weight: 116.8 kg 114.8 kg 110.6 kg    Telemetry    NSR - Personally Reviewed  ECG    No new tracing.  Physical Exam   GEN: No acute distress.  Breathing more comfortably today. Neck: JVP 8 cm. Cardiac: GBT,5/1 systolic murmur, rubs, or gallops.  Respiratory: Clear to auscultation bilaterally. GI: Soft, nontender, non-distended  MS: +1 pitting edema bilaterally Neuro:  Nonfocal  Psych: Normal affect   Labs    Chemistry Recent  Labs  Lab 01/09/19 1742 01/10/19 1715 01/11/19 0511 01/12/19 0721  NA 138 134* 135 135  K 3.8 3.7 3.8 3.5  CL 108 104 105 104  CO2 24 25 22 23   GLUCOSE 69* 123* 138* 118*  BUN 13 17 19 17   CREATININE 1.18 1.31* 1.12 1.03  CALCIUM 8.5* 8.0* 7.8* 7.7*  PROT 7.1  --   --   --   ALBUMIN 3.5  --   --   --   AST 29  --   --   --   ALT 19  --   --   --   ALKPHOS 61  --   --   --   BILITOT 1.5*  --   --   --   GFRNONAA >60 >60 >60 >60  GFRAA >60 >60 >60 >60  ANIONGAP 6 5 8 8      Hematology Recent Labs  Lab 01/09/19 1742 01/10/19 0939 01/11/19 0511  WBC 4.0 3.4* 3.4*  RBC 4.14* 4.32 3.87*  HGB 13.0 13.5 12.1*  HCT 41.1 42.7 38.2*  MCV 99.3 98.8 98.7  MCH 31.4 31.3 31.3  MCHC 31.6 31.6 31.7  RDW 13.0 13.1 13.2  PLT 287 263 221    Cardiac Enzymes Recent Labs  Lab 01/09/19 2115 01/10/19 0050 01/10/19 0353 01/10/19 0632  TROPONINI 1.05* 0.78* 0.85*  0.86*   No results for input(s): TROPIPOC in the last 168 hours.   BNP Recent Labs  Lab 01/09/19 1742  BNP 1,169.0*     DDimer No results for input(s): DDIMER in the last 168 hours.   Radiology    No results found.  Cardiac Studies   2/18 TTE 1. The left ventricle has a visually estimated ejection fraction of of 15-20%. The cavity size was mildly dilated. There is moderately increased left ventricular wall thickness. Left ventricular diastology could not be evaluated Left ventricular  diffuse hypokinesis. 2. Left ventricular systolic function is severely reduced. 3. The right ventricle has severely reduced systolic function. The cavity was moderately enlarged. There is no increase in right ventricular wall thickness. 4. Left atrial size was severely dilated. 5. Right atrial size was moderately dilated.  Patient Profile     48 y.o. male with a hx of DM and pneumonia x3 in the past 2 months, admitted with influenza infection and found to have acute systolic heart failure.  Assessment & Plan    Acute  HFrEF Improving with diuresis.  Still has 1+ leg edema but much better than on discharge.  Breathing much more comfortable today.  Continue furosemide 40 mg x 1 this morning; hold furosemide this PM and tomorrow AM in anticipation of L/RHC.  I have reviewed the risks, indications, and alternatives to cardiac catheterization, possible angioplasty, and stenting with the patient. Risks include but are not limited to bleeding, infection, vascular injury, stroke, myocardial infection, arrhythmia, kidney injury, radiation-related injury in the case of prolonged fluoroscopy use, emergency cardiac surgery, and death. The patient understands the risks of serious complication is 1-2 in 6681 with diagnostic cardiac cath and 1-2% or less with angioplasty/stenting.  Continue lisinopril 10 mg daily and spironolactone 12.5 mg daily.  Start metoprolol succinate 12.5 mg daily.  Continue ASA and statin until underlying CAD has been excluded.  Demand ischemia Elevated troponin most likely due to supply-demand mismatch in the setting of acute systolic heart failure and influenza.  Plan for cath tomorrow.  Continue ASA and atorvastatin.  Start metoprolol succinate 12.5 mg daily.  Influenza BPositive - management per IM  For questions or updates, please contact [place] Please consult [email or web address] for contact info under [facility].     Signed, [name], MD  01/12/2019, 2:14 PM

## 2019-01-12 NOTE — Plan of Care (Signed)
  Problem: Nutrition: Goal: Adequate nutrition will be maintained Outcome: Not Progressing   

## 2019-01-13 ENCOUNTER — Encounter
Admission: EM | Disposition: A | Discharge status: Home / Self Care | Payer: Self-pay | Source: Home / Self Care | Attending: Internal Medicine

## 2019-01-13 ENCOUNTER — Encounter: Payer: Self-pay | Admitting: Cardiovascular Disease

## 2019-01-13 DIAGNOSIS — I420 Dilated cardiomyopathy: Secondary | ICD-10-CM

## 2019-01-13 HISTORY — PX: RIGHT/LEFT HEART CATH AND CORONARY ANGIOGRAPHY: CATH118266

## 2019-01-13 LAB — BASIC METABOLIC PANEL
Anion gap: 8 (ref 5–15)
BUN: 16 mg/dL (ref 6–20)
CO2: 25 mmol/L (ref 22–32)
Calcium: 7.9 mg/dL — ABNORMAL LOW (ref 8.9–10.3)
Chloride: 105 mmol/L (ref 98–111)
Creatinine, Ser: 0.89 mg/dL (ref 0.61–1.24)
GFR calc Af Amer: >60 mL/min (ref >60)
GFR calc non Af Amer: >60 mL/min (ref >60)
Glucose, Bld: 117 mg/dL — ABNORMAL HIGH (ref 70–99)
Potassium: 3.7 mmol/L (ref 3.5–5.1)
Sodium: 138 mmol/L (ref 135–145)

## 2019-01-13 LAB — GLUCOSE, CAPILLARY
Glucose-Capillary: 113 mg/dL — ABNORMAL HIGH (ref 70–99)
Glucose-Capillary: 97 mg/dL (ref 70–99)
Glucose-Capillary: 142 mg/dL — ABNORMAL HIGH (ref 70–99)
Glucose-Capillary: 155 mg/dL — ABNORMAL HIGH (ref 70–99)

## 2019-01-13 SURGERY — RIGHT/LEFT HEART CATH AND CORONARY ANGIOGRAPHY
Anesthesia: Moderate Sedation

## 2019-01-13 MED ORDER — SODIUM CHLORIDE 0.9% FLUSH: 3.0000 mL | INTRAVENOUS | Status: DC | PRN

## 2019-01-13 MED ORDER — SODIUM CHLORIDE 0.9% FLUSH
3.0000 mL | Freq: Two times a day (BID) | INTRAVENOUS | Status: DC
  Administered 2019-01-13 – 2019-01-14 (×4): 3 mL via INTRAVENOUS

## 2019-01-13 MED ORDER — ASPIRIN 81 MG PO CHEW
81.0000 mg | CHEWABLE_TABLET | ORAL | Status: AC
  Administered 2019-01-13: 81 mg via ORAL
  Filled 2019-01-13: qty 1

## 2019-01-13 MED ORDER — IOPAMIDOL (ISOVUE-300) INJECTION 61%
INTRAVENOUS | Status: DC | PRN
  Administered 2019-01-13: 110 mL via INTRA_ARTERIAL

## 2019-01-13 MED ORDER — ASPIRIN 81 MG PO CHEW: 81.0000 mg | CHEWABLE_TABLET | ORAL | Status: DC

## 2019-01-13 MED ORDER — HEPARIN (PORCINE) IN NACL 1000-0.9 UT/500ML-% IV SOLN
INTRAVENOUS | Status: AC
  Filled 2019-01-13: qty 1000

## 2019-01-13 MED ORDER — SODIUM CHLORIDE 0.9% FLUSH: 3.0000 mL | Freq: Two times a day (BID) | INTRAVENOUS | Status: DC

## 2019-01-13 MED ORDER — SODIUM CHLORIDE 0.9 % IV SOLN: 250.0000 mL | INTRAVENOUS | Status: DC | PRN

## 2019-01-13 MED ORDER — FENTANYL CITRATE (PF) 100 MCG/2ML IJ SOLN
INTRAMUSCULAR | Status: AC
  Filled 2019-01-13: qty 2

## 2019-01-13 MED ORDER — MIDAZOLAM HCL 2 MG/2ML IJ SOLN
INTRAMUSCULAR | Status: DC | PRN
  Administered 2019-01-13: 1 mg via INTRAVENOUS

## 2019-01-13 MED ORDER — ACETAMINOPHEN 325 MG PO TABS
ORAL_TABLET | ORAL | Status: AC
  Filled 2019-01-13: qty 2

## 2019-01-13 MED ORDER — ACETAMINOPHEN 325 MG PO TABS
650.0000 mg | ORAL_TABLET | ORAL | Status: DC | PRN
  Administered 2019-01-13 – 2019-01-14 (×2): 650 mg via ORAL
  Filled 2019-01-13 (×2): qty 2

## 2019-01-13 MED ORDER — ONDANSETRON HCL 4 MG/2ML IJ SOLN: 4.0000 mg | Freq: Four times a day (QID) | INTRAMUSCULAR | Status: DC | PRN

## 2019-01-13 MED ORDER — SODIUM CHLORIDE 0.9 % IV SOLN
INTRAVENOUS | Status: DC
  Administered 2019-01-13: 1000 mL via INTRAVENOUS

## 2019-01-13 MED ORDER — LOSARTAN POTASSIUM 25 MG PO TABS
25.0000 mg | ORAL_TABLET | Freq: Every day | ORAL | Status: DC
  Administered 2019-01-13: 25 mg via ORAL
  Filled 2019-01-13 (×2): qty 1

## 2019-01-13 MED ORDER — MIDAZOLAM HCL 2 MG/2ML IJ SOLN
INTRAMUSCULAR | Status: AC
  Filled 2019-01-13: qty 2

## 2019-01-13 MED ORDER — FENTANYL CITRATE (PF) 100 MCG/2ML IJ SOLN
INTRAMUSCULAR | Status: DC | PRN
  Administered 2019-01-13: 50 ug via INTRAVENOUS

## 2019-01-13 SURGICAL SUPPLY — 13 items
CATH INFINITI 5FR ANG PIGTAIL (CATHETERS) ×2 IMPLANT
CATH INFINITI 5FR JL4 (CATHETERS) ×2 IMPLANT
CATH INFINITI JR4 5F (CATHETERS) ×2 IMPLANT
CATH SWANZ 7F THERMO (CATHETERS) ×2 IMPLANT
DEVICE CLOSURE MYNXGRIP 5F (Vascular Products) ×2 IMPLANT
KIT MANI 3VAL PERCEP (MISCELLANEOUS) ×2 IMPLANT
KIT RIGHT HEART (MISCELLANEOUS) ×2 IMPLANT
NEEDLE PERC 18GX7CM (NEEDLE) ×2 IMPLANT
NEEDLE SMART REG 18GX2-3/4 (NEEDLE) ×2 IMPLANT
PACK CARDIAC CATH (CUSTOM PROCEDURE TRAY) ×2 IMPLANT
SHEATH AVANTI 5FR X 11CM (SHEATH) ×2 IMPLANT
SHEATH AVANTI 7FRX11 (SHEATH) ×2 IMPLANT
WIRE GUIDERIGHT .035X150 (WIRE) ×2 IMPLANT

## 2019-01-13 NOTE — Plan of Care (Signed)
  Problem: Education: Goal: Individualized Educational Video(s) Outcome: Progressing   

## 2019-01-13 NOTE — Progress Notes (Addendum)
[place] [facility] [place] NAME: Gary Bentley    MR#:  [id number]  DATE OF BIRTH:  11/02/1972  SUBJECTIVE:  Patient without complaint, status post heart catheterization-no significant obstruction noted, cardiology input appreciated, for tentative discharge on tomorrow barring any complications REVIEW OF SYSTEMS:  CONSTITUTIONAL: No fever, fatigue or weakness.  EYES: No blurred or double vision.  EARS, NOSE, AND THROAT: No tinnitus or ear pain.  RESPIRATORY: No cough, shortness of breath, wheezing or hemoptysis.  CARDIOVASCULAR: No chest pain, orthopnea, edema.  GASTROINTESTINAL: No nausea, vomiting, diarrhea or abdominal pain.  GENITOURINARY: No dysuria, hematuria.  ENDOCRINE: No polyuria, nocturia,  HEMATOLOGY: No anemia, easy bruising or bleeding SKIN: No rash or lesion. MUSCULOSKELETAL: No joint pain or arthritis.   NEUROLOGIC: No tingling, numbness, weakness.  PSYCHIATRY: No anxiety or depression.   ROS  DRUG ALLERGIES:  No Known Allergies  VITALS:  Blood pressure 109/77, pulse 78, temperature 98.3 F (36.8 C), temperature source Oral, resp. rate 16, height 6\' 5"  (1.956 m), weight 110.6 kg, SpO2 100 %.  PHYSICAL EXAMINATION:  GENERAL:  48 y.o.-year-old patient lying in the bed with no acute distress.  EYES: Pupils equal, round, reactive to light and accommodation. No scleral icterus. Extraocular muscles intact.  HEENT: Head atraumatic, normocephalic. Oropharynx and nasopharynx clear.  NECK:  Supple, no jugular venous distention. No thyroid enlargement, no tenderness.  LUNGS: Normal breath sounds bilaterally, no wheezing, rales,rhonchi or crepitation. No use of accessory muscles of respiration.  CARDIOVASCULAR: S1, S2 normal. No murmurs, rubs, or gallops.  ABDOMEN: Soft, nontender, nondistended. Bowel sounds present. No organomegaly or mass.  EXTREMITIES: No pedal edema, cyanosis, or clubbing.  NEUROLOGIC: Cranial nerves II through  XII are intact. Muscle strength 5/5 in all extremities. Sensation intact. Gait not checked.  PSYCHIATRIC: The patient is alert and oriented x 3.  SKIN: No obvious rash, lesion, or ulcer.   Physical Exam LABORATORY PANEL:   CBC Recent Labs  Lab 01/11/19 0511  WBC 3.4*  HGB 12.1*  HCT 38.2*  PLT 221   ------------------------------------------------------------------------------------------------------------------  Chemistries  Recent Labs  Lab 01/09/19 1742 01/09/19 2115  01/13/19 0629  NA 138  --    < > 138  K 3.8  --    < > 3.7  CL 108  --    < > 105  CO2 24  --    < > 25  GLUCOSE 69*  --    < > 117*  BUN 13  --    < > 16  CREATININE 1.18  --    < > 0.89  CALCIUM 8.5*  --    < > 7.9*  MG  --  1.7  --   --   AST 29  --   --   --   ALT 19  --   --   --   ALKPHOS 61  --   --   --   BILITOT 1.5*  --   --   --    < > = values in this interval not displayed.   ------------------------------------------------------------------------------------------------------------------  Cardiac Enzymes Recent Labs  Lab 01/10/19 0353 01/10/19 0632  TROPONINI 0.85* 0.86*   ------------------------------------------------------------------------------------------------------------------  RADIOLOGY:  No results found.  ASSESSMENT AND PLAN:  48 year old male with multiple medical problems of essential hypertension, diabetes mellitus type 2 has been feeling cough, generalized weakness found to have type B influenza.  *Acute influenza B infection Largely resolved  continue Tamiflu for 5-day course,  supportive care  *Acute non-ST elevation MI  Secondary to demand ischemia Stable  Echocardiogram showed severely decreased LV function with EF 15 to 20% Status post heart catheterization January 13, 7493 without complication-no significant coronary artery disease noted/severe global hypokinesis Continue aspirin, statin rx, Lopressor, ACE inhibitor discontinued-Entresto to be  considered as an outpatient, low-dose losartan, and will follow-up as an outpatient for further evaluation  *acute heart failure with reduced ejection fraction Improving severely reduced LV function with EF 15 to 20% Continue Lasix, Lopressor, spironolactone, losartan  *Chronic diabetes mellitus type 2 Stable Controlled on current regiment   Discharge home on tomorrow pending cardiology clearance/barring any complications   All the records are reviewed and case discussed with Care Management/Social Workerr. Management plans discussed with the patient, family and they are in agreement.  CODE STATUS: full  TOTAL TIME TAKING CARE OF THIS PATIENT: 35 minutes.     POSSIBLE D/C IN 1 DAYS, DEPENDING ON CLINICAL CONDITION.   Avel Peace Salary M.D on 01/13/2019   Between 7am to 6pm - Pager - [phone]  After 6pm go to [email or web address] - password [email or web address] [facility]  Office  [phone]  CC: Primary care physician; Patient, No Pcp Per  Note: This dictation was prepared with Dragon dictation along with smaller phrase technology. Any transcriptional errors that result from this process are unintentional.

## 2019-01-13 NOTE — Progress Notes (Addendum)
Progress Note  Patient Name: Gary Bentley Date of Encounter: 01/13/2019  Primary Cardiologist: [name]  Subjective   Able to lay flat, cardiac catheterization this morning Nonischemic cardiomyopathy, results discussed with him Mildly elevated wedge pressure, right heart pressures  Inpatient Medications    Scheduled Meds: . acetaminophen      . [MAR Hold] aspirin EC  81 mg Oral Daily  . [MAR Hold] atorvastatin  40 mg Oral q1800  . [MAR Hold] enoxaparin (LOVENOX) injection  40 mg Subcutaneous Q24H  . [MAR Hold] feeding supplement (ENSURE ENLIVE)  237 mL Oral BID BM  . [MAR Hold] insulin aspart  0-9 Units Subcutaneous TID WC  . [MAR Hold] lisinopril  10 mg Oral Daily  . [MAR Hold] metoprolol succinate  12.5 mg Oral Daily  . [MAR Hold] oseltamivir  75 mg Oral BID  . [MAR Hold] sodium chloride flush  3 mL Intravenous Q12H  . sodium chloride flush  3 mL Intravenous Q12H  . [MAR Hold] spironolactone  12.5 mg Oral Daily   Continuous Infusions: . sodium chloride    . [START ON 01/14/2019] sodium chloride 1,000 mL (01/13/19 0834)   PRN Meds: sodium chloride, [MAR Hold] acetaminophen **OR** [MAR Hold] acetaminophen, [MAR Hold] albuterol, [MAR Hold] bisacodyl, [MAR Hold] dextrose, [MAR Hold] guaiFENesin-dextromethorphan, [MAR Hold]  morphine injection **OR** [MAR Hold]  morphine injection, [MAR Hold] nitroGLYCERIN, [MAR Hold] ondansetron **OR** [MAR Hold] ondansetron (ZOFRAN) IV, [MAR Hold] oxyCODONE-acetaminophen, [MAR Hold] senna-docusate, sodium chloride flush   Vital Signs    Vitals:   01/13/19 0821 01/13/19 1013 01/13/19 1015 01/13/19 1030  BP:  113/76 97/78 105/81  Pulse: 82 79 84 80  Resp: 19 (!) 29 20 18   Temp: 98.7 F (37.1 C)     TempSrc: Oral     SpO2:  95% 99% 100%  Weight: 110.6 kg     Height: 6\' 5"  (1.956 m)       Intake/Output Summary (Last 24 hours) at 01/13/2019 1036 Last data filed at 01/12/2019 1756 Gross per 24 hour  Intake -  Output 1975 ml    Net -1975 ml   Filed Weights   01/11/19 0349 01/12/19 0539 01/13/19 0821  Weight: 114.8 kg 110.6 kg 110.6 kg    Telemetry    NSR - Personally Reviewed  ECG    No new tracing.  Physical Exam   Constitutional:  oriented to person, place, and time. No distress.  HENT:  Head: Grossly normal Eyes:  no discharge. No scleral icterus.  Neck: No JVD, no carotid bruits  Cardiovascular: Regular rate and rhythm, no murmurs appreciated Trace lower extremity edema Pulmonary/Chest: Clear to auscultation bilaterally, no wheezes or rails Abdominal: Soft.  no distension.  no tenderness.  Musculoskeletal: Normal range of motion Neurological:  normal muscle tone. Coordination normal. No atrophy Skin: Skin warm and dry Psychiatric: normal affect, pleasant   Labs    Chemistry Recent Labs  Lab 01/09/19 1742  01/11/19 0511 01/12/19 0721 01/13/19 0629  NA 138   < > 135 135 138  K 3.8   < > 3.8 3.5 3.7  CL 108   < > 105 104 105  CO2 24   < > 22 23 25   GLUCOSE 69*   < > 138* 118* 117*  BUN 13   < > 19 17 16   CREATININE 1.18   < > 1.12 1.03 0.89  CALCIUM 8.5*   < > 7.8* 7.7* 7.9*  PROT 7.1  --   --   --   --  ALBUMIN 3.5  --   --   --   --   AST 29  --   --   --   --   ALT 19  --   --   --   --   ALKPHOS 61  --   --   --   --   BILITOT 1.5*  --   --   --   --   GFRNONAA >60   < > >60 >60 >60  GFRAA >60   < > >60 >60 >60  ANIONGAP 6   < > 8 8 8    < > = values in this interval not displayed.     Hematology Recent Labs  Lab 01/09/19 1742 01/10/19 0939 01/11/19 0511  WBC 4.0 3.4* 3.4*  RBC 4.14* 4.32 3.87*  HGB 13.0 13.5 12.1*  HCT 41.1 42.7 38.2*  MCV 99.3 98.8 98.7  MCH 31.4 31.3 31.3  MCHC 31.6 31.6 31.7  RDW 13.0 13.1 13.2  PLT 287 263 221    Cardiac Enzymes Recent Labs  Lab 01/09/19 2115 01/10/19 0050 01/10/19 0353 01/10/19 0632  TROPONINI 1.05* 0.78* 0.85* 0.86*   No results for input(s): TROPIPOC in the last 168 hours.   BNP Recent Labs  Lab  01/09/19 1742  BNP 1,169.0*     DDimer No results for input(s): DDIMER in the last 168 hours.   Radiology    No results found.  Cardiac Studies   2/18 TTE 1. The left ventricle has a visually estimated ejection fraction of of 15-20%. The cavity size was mildly dilated. There is moderately increased left ventricular wall thickness. Left ventricular diastology could not be evaluated Left ventricular  diffuse hypokinesis. 2. Left ventricular systolic function is severely reduced. 3. The right ventricle has severely reduced systolic function. The cavity was moderately enlarged. There is no increase in right ventricular wall thickness. 4. Left atrial size was severely dilated. 5. Right atrial size was moderately dilated.  Patient Profile     48 y.o. male with a hx of DM and pneumonia x3 in the past 2 months, admitted with influenza infection and found to have acute systolic heart failure.  Assessment & Plan    Acute HFrEF Etiology unclear, nonischemic Possibly related to recent pneumonias, flu Cardiac catheterization results discussed with him showing no significant coronary disease, severe global hypokinesis noted -Continue metoprolol,  Aldactone Medication change made, started low-dose losartan.  ACE inhibitor held  consideration of Entresto as outpatient Lasix 40 IV x1 more day, then at discharge 40 daily Close monitoring of weight at home, may need additional 40 mg dose after lunch for 3 pound weight gain -CHF clinic, follow-up in cardiology clinic -  Demand ischemia Nonischemic Elevated troponin secondary to dilated cardiomyopathy  Influenza BPositive - management per IM   Total encounter time more than 25 minutes  Greater than 50% was spent in counseling and coordination of care with the patient   For questions or updates, please contact [facility] Please consult [email or web address] for contact info under [facility] Cardiology.     Signed, Ida Rogue, MD   01/13/2019, 10:36 AM

## 2019-01-14 ENCOUNTER — Encounter: Payer: Self-pay | Admitting: Cardiovascular Disease

## 2019-01-14 DIAGNOSIS — I5021 Acute systolic (congestive) heart failure: Secondary | ICD-10-CM

## 2019-01-14 LAB — GLUCOSE, CAPILLARY
Glucose-Capillary: 140 mg/dL — ABNORMAL HIGH (ref 70–99)
Glucose-Capillary: 159 mg/dL — ABNORMAL HIGH (ref 70–99)
Glucose-Capillary: 125 mg/dL — ABNORMAL HIGH (ref 70–99)

## 2019-01-14 MED ORDER — FUROSEMIDE 40 MG PO TABS
40.0000 mg | ORAL_TABLET | Freq: Every day | ORAL | Status: DC
  Administered 2019-01-14 – 2019-01-15 (×2): 40 mg via ORAL
  Filled 2019-01-14 (×2): qty 1

## 2019-01-14 NOTE — Progress Notes (Addendum)
Progress Note  Patient Name: Gary Bentley Date of Encounter: 01/14/2019  Primary Cardiologist: [name], MD  Subjective   Breathing ok.  A little sob when walking to bathroom but overall improved since admission.  Eager to go home.  Inpatient Medications    Scheduled Meds: . aspirin EC  81 mg Oral Daily  . atorvastatin  40 mg Oral q1800  . enoxaparin (LOVENOX) injection  40 mg Subcutaneous Q24H  . feeding supplement (ENSURE ENLIVE)  237 mL Oral BID BM  . insulin aspart  0-9 Units Subcutaneous TID WC  . losartan  25 mg Oral Daily  . metoprolol succinate  12.5 mg Oral Daily  . oseltamivir  75 mg Oral BID  . sodium chloride flush  3 mL Intravenous Q12H  . sodium chloride flush  3 mL Intravenous Q12H  . spironolactone  12.5 mg Oral Daily   Continuous Infusions: . sodium chloride     PRN Meds: sodium chloride, acetaminophen **OR** acetaminophen, acetaminophen, albuterol, bisacodyl, dextrose, guaiFENesin-dextromethorphan, morphine injection **OR** morphine injection, nitroGLYCERIN, ondansetron **OR** ondansetron (ZOFRAN) IV, ondansetron (ZOFRAN) IV, oxyCODONE-acetaminophen, senna-docusate, sodium chloride flush   Vital Signs    Vitals:   01/13/19 1649 01/13/19 2015 01/14/19 0436 01/14/19 0833  BP: 98/71 120/68 99/72 104/74  Pulse: 87 84 86 84  Resp: 18 20 20    Temp: 98.4 F (36.9 C) 98.5 F (36.9 C) 99.1 F (37.3 C) 98.9 F (37.2 C)  TempSrc: Oral Oral Oral Oral  SpO2: 97% 98% 96% 98%  Weight:      Height:        Intake/Output Summary (Last 24 hours) at 01/14/2019 0935 Last data filed at 01/13/2019 2015 Gross per 24 hour  Intake 74.33 ml  Output 200 ml  Net -125.67 ml   Filed Weights   01/11/19 0349 01/12/19 0539 01/13/19 0821  Weight: 114.8 kg 110.6 kg 110.6 kg    Physical Exam   GEN: Well nourished, well developed, in no acute distress.  HEENT: Grossly normal.  Neck: Supple, no JVD, carotid bruits, or masses. Cardiac: RRR, no murmurs, rubs, or  gallops. No clubbing, cyanosis, edema.  Radials/DP/PT 2+ and equal bilaterally.  R groin cath site w/o bleeding/bruit/hematoma. Respiratory:  Respirations regular and unlabored, clear to auscultation bilaterally. GI: Soft, nontender, nondistended, BS + x 4. MS: no deformity or atrophy. Skin: warm and dry, no rash. Neuro:  Strength and sensation are intact. Psych: AAOx3.  Normal affect.  Labs    Chemistry Recent Labs  Lab 01/09/19 1742  01/11/19 0511 01/12/19 0721 01/13/19 0629  NA 138   < > 135 135 138  K 3.8   < > 3.8 3.5 3.7  CL 108   < > 105 104 105  CO2 24   < > 22 23 25   GLUCOSE 69*   < > 138* 118* 117*  BUN 13   < > 19 17 16   CREATININE 1.18   < > 1.12 1.03 0.89  CALCIUM 8.5*   < > 7.8* 7.7* 7.9*  PROT 7.1  --   --   --   --   ALBUMIN 3.5  --   --   --   --   AST 29  --   --   --   --   ALT 19  --   --   --   --   ALKPHOS 61  --   --   --   --   BILITOT 1.5*  --   --   --   --  GFRNONAA >60   < > >60 >60 >60  GFRAA >60   < > >60 >60 >60  ANIONGAP 6   < > 8 8 8    < > = values in this interval not displayed.     Hematology Recent Labs  Lab 01/09/19 1742 01/10/19 0939 01/11/19 0511  WBC 4.0 3.4* 3.4*  RBC 4.14* 4.32 3.87*  HGB 13.0 13.5 12.1*  HCT 41.1 42.7 38.2*  MCV 99.3 98.8 98.7  MCH 31.4 31.3 31.3  MCHC 31.6 31.6 31.7  RDW 13.0 13.1 13.2  PLT 287 263 221    Cardiac Enzymes Recent Labs  Lab 01/09/19 2115 01/10/19 0050 01/10/19 0353 01/10/19 0632  TROPONINI 1.05* 0.78* 0.85* 0.86*     BNP Recent Labs  Lab 01/09/19 1742  BNP 1,169.0*     Radiology    No results found.  Telemetry    Rsr, 70's - Personally Reviewed  Cardiac Studies   2D Echocardiogram 2.18.2020  IMPRESSIONS     1. The left ventricle has a visually estimated ejection fraction of of 15-20%%. The cavity size was mildly dilated. There is moderately increased left ventricular wall thickness. Left ventricular diastology could not be evaluated Left ventricular    diffuse hypokinesis.  2. Left ventricular systolic function is severely reduced.  3. The right ventricle has severely reduced systolic function. The cavity was moderately enlarged. There is no increase in right ventricular wall thickness.  4. Left atrial size was severely dilated.  5. Right atrial size was moderately dilated.  6. The mitral valve is normal in structure.  7. The tricuspid valve is normal in structure.  8. The aortic valve is tricuspid Aortic valve regurgitation is trivial by color flow Doppler.  9. Mild pulmonic stenosis. 10. The aortic root is normal in size and structure. 11. The interatrial septum was not well visualized. _____________   Cardiac Catheterization 2.21.2020Coronary angiography:  Coronary dominance: Right Left mainstem: Large vessel that bifurcates into the LAD and left circumflex, no significant disease noted Left anterior descending (LAD): Large vessel that extends to the apical region, diagonal branch 2 of moderate size, no significant disease noted Left circumflex (LCx): Large vessel with OM branch 2, no significant disease noted Right coronary artery (RCA): Right dominant vessel with PL and PDA, no significant disease noted  Left ventriculography: Left ventricular systolic function is normal, LVEF is estimated at 20%, there is no significant mitral regurgitation , no significant aortic valve stenosis  RA: 7 mean RV 443/10/17 PA: 40/21/29 PCWP: 19 C/O: 5.04 C.I: 2.07  Final Conclusions:  Nonischemic cardiomyopathy, no significant CAD Mildly elevated wedge pressure,  EF severely depressed, global Hypokinesis --continue metoprolol, lisinopril, aldactone  _____________   Patient Profile     48 y.o. male with a hx of DM and pneumonia x3 in the past 2 months, admitted with influenza infection and found to have acute systolic heart failure.  Assessment & Plan    1.  Acute systolic CHF/NICM: EF 31-51% by echo this admission. Cath 2/21 w/ nl  cors and mildly elevated filling pressures (PCWP 19).  Minus 2.2L overnight and 5.2L since admission.  No wt yet this AM. Add back lasix 40mg  PO daily as planned.  Cont  blocker, ARB, and spiro.  BP likely too soft to tolerate entresto but can reassess as outpt.  Will need f/u in 1 wk and has f/u in CHF clinic on 2/27.  Will need bmet @ f/u.  We discussed the importance of daily weights, sodium  restriction, medication compliance, and symptom reporting and he verbalizes understanding.  Will need f/u echo in ~ 40 days once meds optimized.   2.  Elevated troponin:  Demand isch in setting of #1.  Nl cors on cath. On asa/statin.  Needs f/u lipids/lft's.  3.  Influenza B:  Per IM.  Signed, Murray Hodgkins, NP  01/14/2019, 9:35 AM    For questions or updates, please contact   Please consult [email or web address] for contact info under Cardiology/STEMI.    Attending Note:   The patient was seen and examined.  Agree with assessment and plan as noted above.  Changes made to the above note as needed.  Patient seen and independently examined with Ignacia Bayley, NP .   We discussed all aspects of the encounter. I agree with the assessment and plan as stated above.  1.  Acute systolic congestive heart failure: The patient presents with acute shortness of breath.  His echocardiogram and heart catheterization revealed an ejection fraction of around 20%. He is currently on beta-blocker,  ARB, spironolactone. Net diuresis of 5.2 L so far during this admission.  He is overall feeling quite a bit better. At this point we do not have a clear-cut etiology for his acute heart failure.  He has nonobstructive coronary artery disease.  No history of hypertension.  This may be a viral cardiomyopathy. Tinea losartan 25 mg a day.  Continue metoprolol XL 12.5 mg a day. To new spironolactone 12.5 mg a day.  I have advised him to ambulate out in the halls.  He will need a mask since he has influenza.  He is able to ambulate  without difficulty, I think that we can discharge him home tomorrow.  He will need close follow-up in our congestive heart failure clinic.   I have spent a total of 40 minutes with patient reviewing hospital  notes , telemetry, EKGs, labs and examining patient as well as establishing an assessment and plan that was discussed with the patient. > 50% of time was spent in direct patient care.    Thayer Headings, Brooke Bonito., MD, [facility] 01/14/2019, 10:21 AM [address],  [place] Pager [phone]

## 2019-01-14 NOTE — Progress Notes (Signed)
Spoke with dr. Posey Pronto to make aware bp is 104/74 per md hold metoprolol and losartan. Will continue to monitor

## 2019-01-14 NOTE — Progress Notes (Signed)
[facility] NAME: Ugo Thoma    MR#:  [id number]  DATE OF BIRTH:  11/02/1972  SUBJECTIVE:   Patient doing better states that still some shortness of   REVIEW OF SYSTEMS:  CONSTITUTIONAL: No fever, fatigue or weakness.  EYES: No blurred or double vision.  EARS, NOSE, AND THROAT: No tinnitus or ear pain.  RESPIRATORY: No cough, positive shortness of breath, wheezing or hemoptysis.  CARDIOVASCULAR: No chest pain, orthopnea, edema.  GASTROINTESTINAL: No nausea, vomiting, diarrhea or abdominal pain.  GENITOURINARY: No dysuria, hematuria.  ENDOCRINE: No polyuria, nocturia,  HEMATOLOGY: No anemia, easy bruising or bleeding SKIN: No rash or lesion. MUSCULOSKELETAL: No joint pain or arthritis.   NEUROLOGIC: No tingling, numbness, weakness.  PSYCHIATRY: No anxiety or depression.   ROS  DRUG ALLERGIES:  No Known Allergies  VITALS:  Blood pressure 104/74, pulse 84, temperature 98.9 F (37.2 C), temperature source Oral, resp. rate 20, height 6\' 5"  (1.956 m), weight 110.6 kg, SpO2 98 %.  PHYSICAL EXAMINATION:  GENERAL:  48 y.o.-year-old patient lying in the bed with no acute distress.  EYES: Pupils equal, round, reactive to light and accommodation. No scleral icterus. Extraocular muscles intact.  HEENT: Head atraumatic, normocephalic. Oropharynx and nasopharynx clear.  NECK:  Supple, no jugular venous distention. No thyroid enlargement, no tenderness.  LUNGS: Normal breath sounds bilaterally, no wheezing, rales,rhonchi or crepitation. No use of accessory muscles of respiration.  CARDIOVASCULAR: S1, S2 normal. No murmurs, rubs, or gallops.  ABDOMEN: Soft, nontender, nondistended. Bowel sounds present. No organomegaly or mass.  EXTREMITIES: No pedal edema, cyanosis, or clubbing.  NEUROLOGIC: Cranial nerves II through XII are intact. Muscle strength 5/5 in all extremities. Sensation intact. Gait not checked.  PSYCHIATRIC: The patient  is alert and oriented x 3.  SKIN: No obvious rash, lesion, or ulcer.   Physical Exam LABORATORY PANEL:   CBC Recent Labs  Lab 01/11/19 0511  WBC 3.4*  HGB 12.1*  HCT 38.2*  PLT 221   ------------------------------------------------------------------------------------------------------------------  Chemistries  Recent Labs  Lab 01/09/19 1742 01/09/19 2115  01/13/19 0629  NA 138  --    < > 138  K 3.8  --    < > 3.7  CL 108  --    < > 105  CO2 24  --    < > 25  GLUCOSE 69*  --    < > 117*  BUN 13  --    < > 16  CREATININE 1.18  --    < > 0.89  CALCIUM 8.5*  --    < > 7.9*  MG  --  1.7  --   --   AST 29  --   --   --   ALT 19  --   --   --   ALKPHOS 61  --   --   --   BILITOT 1.5*  --   --   --    < > = values in this interval not displayed.   ------------------------------------------------------------------------------------------------------------------  Cardiac Enzymes Recent Labs  Lab 01/10/19 0353 01/10/19 0632  TROPONINI 0.85* 0.86*   ------------------------------------------------------------------------------------------------------------------  RADIOLOGY:  No results found.  ASSESSMENT AND PLAN:  48 year old male with multiple medical problems of essential hypertension, diabetes mellitus type 2 has been feeling cough, generalized weakness found to have type B influenza.  *Acute influenza B infection  continue Tamiflu for 5-day course, supportive care  *Acute non-ST elevation MI  Secondary to  demand ischemia Stable  Echocardiogram showed severely decreased LV function with EF 15 to 20% Status post heart catheterization January 13, 5026 without complication-no significant coronary artery disease noted/severe global hypokinesis Continue aspirin, statin rx, Lopressor, ACE inhibitor, due to blood pressure being low losartan was held today  *acute heart failure with reduced ejection fraction Improving severely reduced LV function with EF 15 to  20% Continue Lasix, Lopressor, spironolactone, due to blood pressure being low losartan was held   *Chronic diabetes mellitus type 2 Stable Controlled on current regiment   According to cardiology discharge tomorrow All the records are reviewed and case discussed with Care Management/Social Workerr. Management plans discussed with the patient, family and they are in agreement.  CODE STATUS: full  TOTAL TIME TAKING CARE OF THIS PATIENT: 35 minutes.     POSSIBLE D/C IN 1 DAYS, DEPENDING ON CLINICAL CONDITION.   Dustin Flock M.D on 01/14/2019   Between 7am to 6pm - Pager - [phone]  After 6pm go to [email or web address] - password [email or web address] [facility]  Office  [phone]  CC: Primary care physician; Patient, No Pcp Per  Note: This dictation was prepared with Dragon dictation along with smaller phrase technology. Any transcriptional errors that result from this process are unintentional.

## 2019-01-15 LAB — GLUCOSE, CAPILLARY: Glucose-Capillary: 118 mg/dL — ABNORMAL HIGH (ref 70–99)

## 2019-01-15 MED ORDER — ASPIRIN 81 MG PO TBEC: 81.0000 mg | DELAYED_RELEASE_TABLET | Freq: Every day | ORAL | Status: DC

## 2019-01-15 MED ORDER — ATORVASTATIN CALCIUM 40 MG PO TABS: 40.0000 mg | ORAL_TABLET | Freq: Every day | ORAL | 0 refills | Status: DC

## 2019-01-15 MED ORDER — SPIRONOLACTONE 25 MG PO TABS: 12.5000 mg | ORAL_TABLET | Freq: Every day | ORAL | 0 refills | Status: DC

## 2019-01-15 MED ORDER — INSULIN ASPART PROT & ASPART (70-30 MIX) 100 UNIT/ML PEN: 10.0000 [IU] | PEN_INJECTOR | Freq: Two times a day (BID) | SUBCUTANEOUS | 11 refills | Status: AC

## 2019-01-15 MED ORDER — FUROSEMIDE 40 MG PO TABS: 40.0000 mg | ORAL_TABLET | Freq: Every day | ORAL | 0 refills | Status: DC

## 2019-01-15 MED ORDER — GUAIFENESIN-DM 100-10 MG/5ML PO SYRP: 5.0000 mL | ORAL_SOLUTION | ORAL | 0 refills | Status: DC | PRN

## 2019-01-15 MED ORDER — METOPROLOL SUCCINATE ER 25 MG PO TB24: 12.5000 mg | ORAL_TABLET | Freq: Every day | ORAL | 0 refills | Status: DC

## 2019-01-15 NOTE — Discharge Summary (Signed)
[facility] [place] [facility], [place] y.o., DOB 11/02/1972, MRN [id number]. Admission date: 01/09/2019 Discharge Date 01/15/2019 Primary MD Patient, No Pcp Per Admitting Physician Arta Silence, MD  Admission Diagnosis  Elevated brain natriuretic peptide (BNP) level [R79.89] Influenza [J11.1]  Discharge Diagnosis   Active Problems:  Influenza B Acute non-ST MI secondary to demand ischemia that is post cardiac catheterization Acute systolic congestive heart failure Diabetes type 2      Hospital Course  48 year old male with multiple medical problems of essential hypertension, diabetes mellitus type 2 has been feeling cough, generalized weakness found to have type B influenza.  Patient was noted to have elevated troponin and underwent further evaluation with cardiology also had a echocardiogram.  Underwent a cardiac cath which showed no significant coronary artery disease.  Was noted to have nonischemic cardiomyopathy.  Patient was seen by cardiology and was placed on medications.  He will follow-up outpatient with CHF clinic as well as cardiology.  Patient also noticed to have influenza B which was treated.              Consults  cardiology  Significant Tests:  See full reports for all details     Dg Chest 2 View  Result Date: 01/09/2019 CLINICAL DATA:  Cough and shortness of breath EXAM: CHEST - 2 VIEW COMPARISON:  December 23, 2018 FINDINGS: There is a subtle area of increased opacity in the left upper lobe. The lungs elsewhere are clear. Heart remains enlarged with pulmonary vascularity normal. No adenopathy. No bone lesions. IMPRESSION: Subtle area of increased opacity in the left upper lobe concerning for early pneumonia. Lungs elsewhere clear. Stable cardiomegaly. Followup PA and lateral chest radiographs recommended in 3-4 weeks following trial of antibiotic therapy to ensure resolution and exclude underlying malignancy.  Electronically Signed   By: Lowella Grip III M.D.   On: 01/09/2019 16:33   Dg Chest 2 View  Result Date: 12/23/2018 CLINICAL DATA:  Cough pain for 1 week, nausea, vomiting and diarrhea. Shortness of breath and cough. History of diabetes. EXAM: CHEST - 2 VIEW COMPARISON:  Chest radiograph December 21, 2018 FINDINGS: Stable cardiomegaly. Mediastinal silhouette is unremarkable. Mild chronic interstitial changes of pleural effusion or focal consolidation. No pneumothorax. Soft tissue planes and included osseous structures are non suspicious. IMPRESSION: 1. Stable cardiomegaly and chronic interstitial changes. Electronically Signed   By: Elon Alas M.D.   On: 12/23/2018 18:22   Dg Chest 2 View  Result Date: 12/21/2018 CLINICAL DATA:  Cough, chills and headache EXAM: CHEST - 2 VIEW COMPARISON:  11/25/2018 FINDINGS: Improved aeration of the right lung base. No focal consolidation or pulmonary edema. No pleural effusion or pneumothorax. Mild cardiomegaly. IMPRESSION: No active cardiopulmonary disease. Electronically Signed   By: Ulyses Jarred M.D.   On: 12/21/2018 18:55   Ct Abdomen Pelvis W Contrast  Result Date: 12/23/2018 CLINICAL DATA:  Abdominal pain for 1 week, nausea, vomiting, diarrhea, history diabetes mellitus, former smoker EXAM: CT ABDOMEN AND PELVIS WITH CONTRAST TECHNIQUE: Multidetector CT imaging of the abdomen and pelvis was performed using the standard protocol following bolus administration of intravenous contrast. Sagittal and coronal MPR images reconstructed from axial data set. CONTRAST:  117mL ISOVUE-300 IOPAMIDOL (ISOVUE-300) INJECTION 61% IV COMPARISON:  11/25/2018 FINDINGS: Lower chest: LEFT lower lobe infiltrate. Subsegmental atelectasis RIGHT middle lobe base. Enlargement of cardiac chambers. Hepatobiliary: Gallbladder and liver normal appearance Pancreas: Normal appearance Spleen: Normal appearance Adrenals/Urinary Tract: Thickening of adrenal glands without discrete mass.  Kidneys, ureters, and bladder normal appearance Stomach/Bowel: Normal appendix. Diffuse wall thickening of the colon from the distal ascending colon into proximal sigmoid colon compatible with colitis. Extensive pericolic infiltrative changes and edema. Cecum and proximal ascending colon as well as distal sigmoid colon and rectum are normal in wall thickness without inflammatory changes. Few sigmoid diverticula are identified. Small bowel loops and stomach are normal in appearance. Vascular/Lymphatic: Minimal atherosclerotic calcifications aorta and iliac arteries. No adenopathy. 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 Unremarkable prostate gland and seminal vesicles Other: Small amount of free fluid in pelvis. Small RIGHT inguinal hernia containing fat. No free air. Musculoskeletal: Osseous structures unremarkable. Diffusely bulging versus broad-based posterior disc herniation L4-L5 flattening thecal sac. IMPRESSION: Long segment of colitis with extensive pericolic inflammatory changes extending from distal ascending colon into proximal sigmoid colon; differential diagnosis would include infection and inflammatory bowel disease. No evidence of perforation or abscess. Enlargement of cardiac chambers. Small amount of nonspecific free pelvic fluid. LEFT inguinal hernia containing fat. LEFT lower lobe infiltrate question pneumonia. Electronically Signed   By: Lavonia Dana M.D.   On: 12/23/2018 18:03       Today   Subjective:   Gary Bentley patient doing much better shortness of breath improved Objective:   Blood pressure 104/85, pulse 83, temperature 98 F (36.7 C), temperature source Oral, resp. rate 19, height 6\' 5"  (1.956 m), weight 62.2 kg, SpO2 96 %.  .  Intake/Output Summary (Last 24 hours) at 01/15/2019 1400 Last data filed at 01/14/2019 1705 Gross per 24 hour  Intake -  Output 425 ml  Net -425 ml    Exam VITAL SIGNS: Blood pressure 104/85,  pulse 83, temperature 98 F (36.7 C), temperature source Oral, resp. rate 19, height 6\' 5"  (1.956 m), weight 62.2 kg, SpO2 96 %.  GENERAL:  48 y.o.-year-old patient lying in the bed with no acute distress.  EYES: Pupils equal, round, reactive to light and accommodation. No scleral icterus. Extraocular muscles intact.  HEENT: Head atraumatic, normocephalic. Oropharynx and nasopharynx clear.  NECK:  Supple, no jugular venous distention. No thyroid enlargement, no tenderness.  LUNGS: Normal breath sounds bilaterally, no wheezing, rales,rhonchi or crepitation. No use of accessory muscles of respiration.  CARDIOVASCULAR: S1, S2 normal. No murmurs, rubs, or gallops.  ABDOMEN: Soft, nontender, nondistended. Bowel sounds present. No organomegaly or mass.  EXTREMITIES: No pedal edema, cyanosis, or clubbing.  NEUROLOGIC: Cranial nerves II through XII are intact. Muscle strength 5/5 in all extremities. Sensation intact. Gait not checked.  PSYCHIATRIC: The patient is alert and oriented x 3.  SKIN: No obvious rash, lesion, or ulcer.   Data Review     CBC w Diff:  Lab Results  Component Value Date   WBC 3.4 (L) 01/11/2019   HGB 12.1 (L) 01/11/2019   HCT 38.2 (L) 01/11/2019   PLT 221 01/11/2019   LYMPHOPCT 10 01/10/2019   MONOPCT 24 01/10/2019   EOSPCT 0 01/10/2019   BASOPCT 1 01/10/2019   CMP:  Lab Results  Component Value Date   NA 138 01/13/2019   K 3.7 01/13/2019   CL 105 01/13/2019   CO2 25 01/13/2019   BUN 16 01/13/2019   CREATININE 0.89 01/13/2019   PROT 7.1 01/09/2019   ALBUMIN 3.5 01/09/2019   BILITOT 1.5 (H) 01/09/2019   ALKPHOS 61 01/09/2019   AST 29 01/09/2019   ALT 19 01/09/2019  .  Micro Results No results found for this or any previous visit (from  the past 240 hour(s)).      Code Status Orders  (From admission, onward)         Start     Ordered   01/10/19 0055  Full code  Continuous     01/10/19 0054        Code Status History    Date Active Date  Inactive Code Status Order ID Comments User Context   03/22/2018 1300 03/24/2018 2113 Full Code [id number]  Gladstone Lighter, MD ED   09/04/2017 1340 09/05/2017 1617 Full Code [id number]  Idelle Crouch, MD ED          Follow-up Information    [facility] Follow up on 01/19/2019.   Specialty:  Cardiology Why:  @ 12:20 p.m.  New patient appointment with Darylene Price, FNP in the [facility].   Contact information: [address] [place] [phone]       [name], MD Follow up in 10 day(s).   Specialty:  Cardiology Contact information: [place] [address] [phone]           Discharge Medications   Allergies as of 01/15/2019   No Known Allergies     Medication List    STOP taking these medications   benzonatate 100 MG capsule Commonly known as:  TESSALON PERLES   lisinopril 10 MG tablet Commonly known as:  PRINIVIL,ZESTRIL   metFORMIN 1000 MG tablet Commonly known as:  GLUCOPHAGE     TAKE these medications   albuterol 108 (90 Base) MCG/ACT inhaler Commonly known as:  PROVENTIL HFA;VENTOLIN HFA Inhale 2 puffs into the lungs every 6 (six) hours as needed for wheezing or shortness of breath.   aspirin 81 MG EC tablet Take 1 tablet (81 mg total) by mouth daily.   atorvastatin 40 MG tablet Commonly known as:  LIPITOR Take 1 tablet (40 mg total) by mouth daily at 6 PM.   furosemide 40 MG tablet Commonly known as:  LASIX Take 1 tablet (40 mg total) by mouth daily.   guaiFENesin-dextromethorphan 100-10 MG/5ML syrup Commonly known as:  ROBITUSSIN DM Take 5 mLs by mouth every 4 (four) hours as needed for cough.   insulin aspart protamine - aspart (70-30) 100 UNIT/ML FlexPen Commonly known as:  NOVOLOG MIX 70/30 FLEXPEN Inject 0.1 mLs (10 Units total) into the skin 2 (two) times daily. What changed:  how much to take    metoprolol succinate 25 MG 24 hr tablet Commonly known as:  TOPROL-XL Take 0.5 tablets (12.5 mg total) by mouth daily.   ondansetron 4 MG tablet Commonly known as:  ZOFRAN Take 1 tablet (4 mg total) by mouth every 8 (eight) hours as needed for nausea or vomiting.   oxyCODONE-acetaminophen 5-325 MG tablet Commonly known as:  PERCOCET Take 1 tablet by mouth every 4 (four) hours as needed for severe pain.   spironolactone 25 MG tablet Commonly known as:  ALDACTONE Take 0.5 tablets (12.5 mg total) by mouth daily.          Total Time in preparing paper work, data evaluation and todays exam - 59 minutes  Dustin Flock M.D on 01/15/2019 at 2:00 PM [place]  [phone]

## 2019-01-15 NOTE — Progress Notes (Addendum)
Progress Note  Patient Name: Gary Bentley Date of Encounter: 01/15/2019  Primary Cardiologist: [name], MD  Subjective   Ambulated some yesterday w/o difficulty.  R groin is sore - using ice pack.  Inpatient Medications    Scheduled Meds: . aspirin EC  81 mg Oral Daily  . atorvastatin  40 mg Oral q1800  . enoxaparin (LOVENOX) injection  40 mg Subcutaneous Q24H  . feeding supplement (ENSURE ENLIVE)  237 mL Oral BID BM  . furosemide  40 mg Oral Daily  . insulin aspart  0-9 Units Subcutaneous TID WC  . metoprolol succinate  12.5 mg Oral Daily  . oseltamivir  75 mg Oral BID  . sodium chloride flush  3 mL Intravenous Q12H  . sodium chloride flush  3 mL Intravenous Q12H  . spironolactone  12.5 mg Oral Daily   Continuous Infusions: . sodium chloride     PRN Meds: sodium chloride, acetaminophen **OR** acetaminophen, acetaminophen, albuterol, bisacodyl, dextrose, guaiFENesin-dextromethorphan, morphine injection **OR** morphine injection, nitroGLYCERIN, ondansetron **OR** ondansetron (ZOFRAN) IV, ondansetron (ZOFRAN) IV, oxyCODONE-acetaminophen, senna-docusate, sodium chloride flush   Vital Signs    Vitals:   01/14/19 1648 01/14/19 2328 01/15/19 0456 01/15/19 0458  BP: 112/78 104/84 107/79   Pulse: 88 89 73   Resp:  20 20   Temp: 98.5 F (36.9 C) 98.4 F (36.9 C) 97.9 F (36.6 C)   TempSrc: Oral Oral Oral   SpO2: 100% 100% 100%   Weight:    62.2 kg  Height:        Intake/Output Summary (Last 24 hours) at 01/15/2019 0740 Last data filed at 01/14/2019 1705 Gross per 24 hour  Intake 237 ml  Output 425 ml  Net -188 ml   Filed Weights   01/12/19 0539 01/13/19 0821 01/15/19 0458  Weight: 110.6 kg 110.6 kg 62.2 kg    Physical Exam   GEN: Well nourished, well developed, in no acute distress.  HEENT: Grossly normal.  Neck: Supple, no JVD, carotid bruits, or masses. Cardiac: RRR, no murmurs, rubs, or gallops. No clubbing, cyanosis, edema.  Radials/DP/PT 2+ and  equal bilaterally. R groin cath site w/o bleeding/bruit/hematoma. Respiratory:  Respirations regular and unlabored, clear to auscultation bilaterally. GI: Soft, nontender, nondistended, BS + x 4. MS: no deformity or atrophy. Skin: warm and dry, no rash. Neuro:  Strength and sensation are intact. Psych: AAOx3.  Normal affect.  Labs    Chemistry Recent Labs  Lab 01/09/19 1742  01/11/19 0511 01/12/19 0721 01/13/19 0629  NA 138   < > 135 135 138  K 3.8   < > 3.8 3.5 3.7  CL 108   < > 105 104 105  CO2 24   < > 22 23 25   GLUCOSE 69*   < > 138* 118* 117*  BUN 13   < > 19 17 16   CREATININE 1.18   < > 1.12 1.03 0.89  CALCIUM 8.5*   < > 7.8* 7.7* 7.9*  PROT 7.1  --   --   --   --   ALBUMIN 3.5  --   --   --   --   AST 29  --   --   --   --   ALT 19  --   --   --   --   ALKPHOS 61  --   --   --   --   BILITOT 1.5*  --   --   --   --  GFRNONAA >60   < > >60 >60 >60  GFRAA >60   < > >60 >60 >60  ANIONGAP 6   < > 8 8 8    < > = values in this interval not displayed.     Hematology Recent Labs  Lab 01/09/19 1742 01/10/19 0939 01/11/19 0511  WBC 4.0 3.4* 3.4*  RBC 4.14* 4.32 3.87*  HGB 13.0 13.5 12.1*  HCT 41.1 42.7 38.2*  MCV 99.3 98.8 98.7  MCH 31.4 31.3 31.3  MCHC 31.6 31.6 31.7  RDW 13.0 13.1 13.2  PLT 287 263 221    Cardiac Enzymes Recent Labs  Lab 01/09/19 2115 01/10/19 0050 01/10/19 0353 01/10/19 0632  TROPONINI 1.05* 0.78* 0.85* 0.86*     BNP Recent Labs  Lab 01/09/19 1742  BNP 1,169.0*      Radiology    No results found.  Telemetry    rsr - Personally Reviewed  Cardiac Studies   2D Echocardiogram 2.18.2020  IMPRESSIONS  1. The left ventricle has a visually estimated ejection fraction of of 15-20%%. The cavity size was mildly dilated. There is moderately increased left ventricular wall thickness. Left ventricular diastology could not be evaluated Left ventricular  diffuse hypokinesis. 2. Left ventricular systolic function is severely  reduced. 3. The right ventricle has severely reduced systolic function. The cavity was moderately enlarged. There is no increase in right ventricular wall thickness. 4. Left atrial size was severely dilated. 5. Right atrial size was moderately dilated. 6. The mitral valve is normal in structure. 7. The tricuspid valve is normal in structure. 8. The aortic valve is tricuspid Aortic valve regurgitation is trivial by color flow Doppler. 9. Mild pulmonic stenosis. 10. The aortic root is normal in size and structure. 11. The interatrial septum was not well visualized. _____________   Cardiac Catheterization 2.21.2020Coronary angiography:  Coronary dominance: Right Left mainstem: Large vessel that bifurcates into the LAD and left circumflex, no significant disease noted Left anterior descending (LAD): Large vessel that extends to the apical region, diagonal branch 2 of moderate size, no significant disease noted Left circumflex (LCx): Large vessel with OM branch 2, no significant disease noted Right coronary artery (RCA): Right dominant vessel with PL and PDA, no significant disease noted  Left ventriculography: Left ventricular systolic function is normal, LVEF is estimated at 20%, there is no significant mitral regurgitation , no significant aortic valve stenosis  RA: 7 mean RV 443/10/17 PA: 40/21/29 PCWP: 19 C/O: 5.04 C.I: 2.07  Final Conclusions:  Nonischemic cardiomyopathy, no significant CAD Mildly elevated wedge pressure,  EF severely depressed, global Hypokinesis --continue metoprolol, lisinopril, aldactone  _____________  Patient Profile     48 y.o.malewith a hx of DM and pneumonia x3 in the past 2 months, admitted with influenza infection and found to have acute systolic heart failure.   Assessment & Plan    1.  Acute systolic CHF/NICM: EF 94-70% by echo this admission. Cath 2/21 w/ nl cors and mildly elevated filling pressures (PCWP 19). No dyspnea this AM.   Ambulated yesterday w/o difficulty.  Oral lasix initiated 2/22.  Minus 130ml yesterday.  Wt inaccurate.  Cont current dose of lasix,  blocker, and spiro.  ARB d/c'd 2/2 soft bp's yesterday.  Can look to resume as outpt.  Has f/u on 2/27 in CHF clinic.  Will need f/u echo in ~ 40 days once meds optimized to determine candidacy for ICD.  2.  Elevated troponin:  Demand isch in setting of #1.  Nl cors on  cath. On asa/statin.  Needs f/u lipids/lft's.  3.  Influenza B:  Per IM.  Signed, Murray Hodgkins, NP  01/15/2019, 7:40 AM    For questions or updates, please contact   Please consult [email or web address] for contact info under Cardiology/STEMI.   Attending Note:   The patient was seen and examined.  Agree with assessment and plan as noted above.  Changes made to the above note as needed.  Patient seen and independently examined with Ignacia Bayley, NP .   We discussed all aspects of the encounter. I agree with the assessment and plan as stated above.  1.   Acute systolic chf Has normal cors. ? Viral CM. Continue lasix  toprol XL 12. 5 a day , spiro 12.5 mg a day  BP today is still fairly soft .   Will need to add ARB after BP improves.   He will follow up in the CHF clinic     I have spent a total of 40 minutes with patient reviewing hospital  notes , telemetry, EKGs, labs and examining patient as well as establishing an assessment and plan that was discussed with the patient. > 50% of time was spent in direct patient care.    Thayer Headings, Brooke Bonito., MD, [facility] 01/15/2019, 11:15 AM [address],  [place] Pager [phone]

## 2019-01-16 LAB — GLUCOSE, CAPILLARY: Glucose-Capillary: 174 mg/dL — ABNORMAL HIGH (ref 70–99)

## 2019-01-18 NOTE — Progress Notes (Deleted)
   Patient ID: Gary Bentley, male    DOB: 11/02/1972, 48 y.o.   MRN: [id number]  HPI  Gary Bentley is a 48 y/o male with a history of  Echo report from 01/10/2019 reviewed and showed an EF of 15-20%  Review of Systems    Physical Exam

## 2019-01-19 ENCOUNTER — Ambulatory Visit: Payer: 59 | Admitting: Family

## 2019-01-23 ENCOUNTER — Ambulatory Visit: Payer: 59 | Admitting: Family

## 2019-01-23 ENCOUNTER — Telehealth: Payer: Self-pay | Admitting: Family

## 2019-01-23 NOTE — Telephone Encounter (Signed)
Patient did not show for his Heart Failure Clinic appointment on 01/23/2019. Will attempt to reschedule.

## 2019-02-04 ENCOUNTER — Emergency Department: Payer: 59

## 2019-02-04 ENCOUNTER — Encounter: Payer: Self-pay | Admitting: Emergency Medicine

## 2019-02-04 ENCOUNTER — Other Ambulatory Visit: Payer: Self-pay

## 2019-02-04 ENCOUNTER — Emergency Department
Admission: EM | Admit: 2019-02-04 | Discharge: 2019-02-04 | Disposition: A | Discharge status: Home / Self Care | Payer: 59 | Attending: Student in an Organized Health Care Education/Training Program | Admitting: Student in an Organized Health Care Education/Training Program

## 2019-02-04 DIAGNOSIS — Z87891 Personal history of nicotine dependence: Secondary | ICD-10-CM | POA: Diagnosis not present

## 2019-02-04 DIAGNOSIS — R05 Cough: Secondary | ICD-10-CM | POA: Diagnosis not present

## 2019-02-04 DIAGNOSIS — R609 Edema, unspecified: Secondary | ICD-10-CM | POA: Diagnosis not present

## 2019-02-04 DIAGNOSIS — E119 Type 2 diabetes mellitus without complications: Secondary | ICD-10-CM | POA: Insufficient documentation

## 2019-02-04 DIAGNOSIS — R1030 Lower abdominal pain, unspecified: Secondary | ICD-10-CM | POA: Diagnosis not present

## 2019-02-04 DIAGNOSIS — Z794 Long term (current) use of insulin: Secondary | ICD-10-CM | POA: Diagnosis not present

## 2019-02-04 DIAGNOSIS — R109 Unspecified abdominal pain: Secondary | ICD-10-CM

## 2019-02-04 DIAGNOSIS — Z79899 Other long term (current) drug therapy: Secondary | ICD-10-CM | POA: Insufficient documentation

## 2019-02-04 DIAGNOSIS — Z7982 Long term (current) use of aspirin: Secondary | ICD-10-CM | POA: Insufficient documentation

## 2019-02-04 DIAGNOSIS — R600 Localized edema: Secondary | ICD-10-CM | POA: Diagnosis not present

## 2019-02-04 DIAGNOSIS — R0602 Shortness of breath: Secondary | ICD-10-CM | POA: Insufficient documentation

## 2019-02-04 DIAGNOSIS — R1032 Left lower quadrant pain: Secondary | ICD-10-CM | POA: Diagnosis not present

## 2019-02-04 LAB — URINALYSIS, COMPLETE (UACMP) WITH MICROSCOPIC
Bacteria, UA: NONE SEEN
Bilirubin Urine: NEGATIVE
GLUCOSE, UA: NEGATIVE mg/dL
Hgb urine dipstick: NEGATIVE
Ketones, ur: NEGATIVE mg/dL
Leukocytes,Ua: NEGATIVE
Nitrite: NEGATIVE
Protein, ur: 100 mg/dL — AB
SPECIFIC GRAVITY, URINE: 1.039 — AB (ref 1.005–1.030)
pH: 6 (ref 5.0–8.0)

## 2019-02-04 LAB — COMPREHENSIVE METABOLIC PANEL
ALT: 35 U/L (ref 0–44)
AST: 53 U/L — ABNORMAL HIGH (ref 15–41)
Albumin: 3.4 g/dL — ABNORMAL LOW (ref 3.5–5.0)
Alkaline Phosphatase: 66 U/L (ref 38–126)
Anion gap: 10 (ref 5–15)
BILIRUBIN TOTAL: 1.9 mg/dL — AB (ref 0.3–1.2)
BUN: 11 mg/dL (ref 6–20)
CO2: 22 mmol/L (ref 22–32)
CREATININE: 1.17 mg/dL (ref 0.61–1.24)
Calcium: 8.6 mg/dL — ABNORMAL LOW (ref 8.9–10.3)
Chloride: 105 mmol/L (ref 98–111)
GFR calc Af Amer: >60 mL/min (ref >60)
Glucose, Bld: 139 mg/dL — ABNORMAL HIGH (ref 70–99)
Potassium: 3.7 mmol/L (ref 3.5–5.1)
Sodium: 137 mmol/L (ref 135–145)
Total Protein: 7.1 g/dL (ref 6.5–8.1)

## 2019-02-04 LAB — CBC
HCT: 38.4 % — ABNORMAL LOW (ref 39.0–52.0)
Hemoglobin: 12.3 g/dL — ABNORMAL LOW (ref 13.0–17.0)
MCH: 31.5 pg (ref 26.0–34.0)
MCHC: 32 g/dL (ref 30.0–36.0)
MCV: 98.5 fL (ref 80.0–100.0)
PLATELETS: 268 10*3/uL (ref 150–400)
RBC: 3.9 MIL/uL — ABNORMAL LOW (ref 4.22–5.81)
RDW: 13.8 % (ref 11.5–15.5)
WBC: 12.7 10*3/uL — ABNORMAL HIGH (ref 4.0–10.5)
nRBC: 0 % (ref 0.0–0.2)

## 2019-02-04 LAB — INFLUENZA PANEL BY PCR (TYPE A & B)
Influenza A By PCR: NEGATIVE
Influenza B By PCR: NEGATIVE

## 2019-02-04 LAB — BRAIN NATRIURETIC PEPTIDE: B Natriuretic Peptide: 1514 pg/mL — ABNORMAL HIGH (ref 0.0–100.0)

## 2019-02-04 LAB — LIPASE, BLOOD: Lipase: 18 U/L (ref 11–51)

## 2019-02-04 MED ORDER — FUROSEMIDE 40 MG PO TABS: 40.0000 mg | ORAL_TABLET | Freq: Every day | ORAL | 0 refills | Status: DC

## 2019-02-04 MED ORDER — IOHEXOL 300 MG/ML  SOLN
100.0000 mL | Freq: Once | INTRAMUSCULAR | Status: AC | PRN
  Administered 2019-02-04: 100 mL via INTRAVENOUS

## 2019-02-04 MED ORDER — FUROSEMIDE 10 MG/ML IJ SOLN
40.0000 mg | Freq: Once | INTRAMUSCULAR | Status: AC
  Administered 2019-02-04: 40 mg via INTRAVENOUS
  Filled 2019-02-04: qty 4

## 2019-02-04 MED ORDER — SODIUM CHLORIDE 0.9 % IV BOLUS
500.0000 mL | Freq: Once | INTRAVENOUS | Status: AC
  Administered 2019-02-04: 500 mL via INTRAVENOUS

## 2019-02-04 MED ORDER — SODIUM CHLORIDE 0.9% FLUSH: 3.0000 mL | Freq: Once | INTRAVENOUS | Status: DC

## 2019-02-04 NOTE — ED Notes (Signed)
Pt resting at this time in NAD. No pain reported. Pt able to use restroom when necessary.

## 2019-02-04 NOTE — Discharge Instructions (Signed)

## 2019-02-04 NOTE — ED Notes (Signed)
Patient transported to CT 

## 2019-02-04 NOTE — ED Notes (Signed)
Patient transported to X-ray 

## 2019-02-04 NOTE — ED Provider Notes (Signed)
[facility] Emergency Department Provider Note    First MD Initiated Contact with Patient 02/04/19 1741     (approximate)  I have reviewed the triage vital signs and the nursing notes.   HISTORY  Chief Complaint Abdominal Pain and Cough    HPI Gary Bentley is a 48 y.o. male listed past medical history presents the ER for several days of worsening shortness of breath as well as left lower quadrant pain.  Is having fevers and chills.  Does feel very weak.  Was diagnosed with flu at the end of last month.  He is not currently on any antibiotics.  He does not smoke.  No recent foreign travel.  He is having loose stools but no bloody diarrhea.    Past Medical History:  Diagnosis Date  . Diabetes mellitus without complication ([place])    Family History  Family history unknown: Yes   Past Surgical History:  Procedure Laterality Date  . FOOT SURGERY    . RIGHT/LEFT HEART CATH AND CORONARY ANGIOGRAPHY N/A 01/13/2019   Procedure: RIGHT/LEFT HEART CATH AND CORONARY ANGIOGRAPHY;  Surgeon: Minna Merritts, MD;  Location: [place] [facility];  Service: Cardiovascular;  Laterality: N/A;   Patient Active Problem List   Diagnosis Date Noted  . Influenza B 01/09/2019  . Cellulitis 03/22/2018  . Osteomyelitis ([place]) 09/04/2017  . Diabetes mellitus due to underlying condition with foot ulcer (CODE) ([place]) 09/04/2017  . Hyperglycemia 09/04/2017  . Foot pain, bilateral 09/04/2017      Prior to Admission medications   Medication Sig Start Date End Date Taking? Authorizing Provider  albuterol (PROVENTIL HFA;VENTOLIN HFA) 108 (90 Base) MCG/ACT inhaler Inhale 2 puffs into the lungs every 6 (six) hours as needed for wheezing or shortness of breath. 10/24/18   Laban Emperor, PA-C  aspirin EC 81 MG EC tablet Take 1 tablet (81 mg total) by mouth daily. 01/15/19   Dustin Flock, MD  atorvastatin (LIPITOR) 40 MG tablet Take 1 tablet (40 mg total) by mouth daily at 6 PM.  01/15/19   Dustin Flock, MD  furosemide (LASIX) 40 MG tablet Take 1 tablet (40 mg total) by mouth daily. 01/15/19   Dustin Flock, MD  guaiFENesin-dextromethorphan [facility] DM) 100-10 MG/5ML syrup Take 5 mLs by mouth every 4 (four) hours as needed for cough. 01/15/19   Dustin Flock, MD  insulin aspart protamine - aspart (NOVOLOG MIX 70/30 FLEXPEN) (70-30) 100 UNIT/ML FlexPen Inject 0.1 mLs (10 Units total) into the skin 2 (two) times daily. 01/15/19   Dustin Flock, MD  metoprolol succinate (TOPROL-XL) 25 MG 24 hr tablet Take 0.5 tablets (12.5 mg total) by mouth daily. 01/15/19   Dustin Flock, MD  ondansetron (ZOFRAN) 4 MG tablet Take 1 tablet (4 mg total) by mouth every 8 (eight) hours as needed for nausea or vomiting. 12/23/18   Schuyler Amor, MD  oxyCODONE-acetaminophen (PERCOCET) 5-325 MG tablet Take 1 tablet by mouth every 4 (four) hours as needed for severe pain. 12/23/18   Schuyler Amor, MD  spironolactone (ALDACTONE) 25 MG tablet Take 0.5 tablets (12.5 mg total) by mouth daily. 01/15/19   Dustin Flock, MD    Allergies Patient has no known allergies.    Social History Social History   Tobacco Use  . Smoking status: Former Smoker    Packs/day: 1.00    Types: Cigarettes    Last attempt to quit: 04/17/2017    Years since quitting: 1.8  . Smokeless tobacco: Never Used  Substance  Use Topics  . Alcohol use: No  . Drug use: No    Review of Systems Patient denies headaches, rhinorrhea, blurry vision, numbness, shortness of breath, chest pain, edema, cough, abdominal pain, nausea, vomiting, diarrhea, dysuria, fevers, rashes or hallucinations unless otherwise stated above in HPI. ____________________________________________   PHYSICAL EXAM:  VITAL SIGNS: Vitals:   02/04/19 2000 02/04/19 2030  BP: 123/83 124/89  Pulse: 94 96  Resp: 18 16  Temp:    SpO2: 92% 97%    Constitutional: Alert and oriented.  Eyes: Conjunctivae are normal.  Head: Atraumatic. Nose:  No congestion/rhinnorhea. Mouth/Throat: Mucous membranes are moist.   Neck: No stridor. Painless ROM.  Cardiovascular:tachycardic, regular rhythm. Grossly normal heart sounds.  Good peripheral circulation. Respiratory: Tachypnea with rhonchorous breath sounds particular in the right side with expiratory wheeze.   Gastrointestinal: Soft and nontender. No distention. No abdominal bruits. No CVA tenderness. Genitourinary:  Musculoskeletal: No lower extremity tenderness nor edema.  No joint effusions. Neurologic:  Normal speech and language. No gross focal neurologic deficits are appreciated. No facial droop Skin:  Skin is warm, dry and intact. No rash noted. Psychiatric: Mood and affect are normal. Speech and behavior are normal.  ____________________________________________   LABS (all labs ordered are listed, but only abnormal results are displayed)  Results for orders placed or performed during the hospital encounter of 02/04/19 (from the past 24 hour(s))  Lipase, blood     Status: None   Collection Time: 02/04/19  4:30 PM  Result Value Ref Range   Lipase 18 11 - 51 U/L  Comprehensive metabolic panel     Status: Abnormal   Collection Time: 02/04/19  4:30 PM  Result Value Ref Range   Sodium 137 135 - 145 mmol/L   Potassium 3.7 3.5 - 5.1 mmol/L   Chloride 105 98 - 111 mmol/L   CO2 22 22 - 32 mmol/L   Glucose, Bld 139 (H) 70 - 99 mg/dL   BUN 11 6 - 20 mg/dL   Creatinine, Ser 1.17 0.61 - 1.24 mg/dL   Calcium 8.6 (L) 8.9 - 10.3 mg/dL   Total Protein 7.1 6.5 - 8.1 g/dL   Albumin 3.4 (L) 3.5 - 5.0 g/dL   AST 53 (H) 15 - 41 U/L   ALT 35 0 - 44 U/L   Alkaline Phosphatase 66 38 - 126 U/L   Total Bilirubin 1.9 (H) 0.3 - 1.2 mg/dL   GFR calc non Af Amer >60 >60 mL/min   GFR calc Af Amer >60 >60 mL/min   Anion gap 10 5 - 15  CBC     Status: Abnormal   Collection Time: 02/04/19  4:30 PM  Result Value Ref Range   WBC 12.7 (H) 4.0 - 10.5 K/uL   RBC 3.90 (L) 4.22 - 5.81 MIL/uL    Hemoglobin 12.3 (L) 13.0 - 17.0 g/dL   HCT 38.4 (L) 39.0 - 52.0 %   MCV 98.5 80.0 - 100.0 fL   MCH 31.5 26.0 - 34.0 pg   MCHC 32.0 30.0 - 36.0 g/dL   RDW 13.8 11.5 - 15.5 %   Platelets 268 150 - 400 K/uL   nRBC 0.0 0.0 - 0.2 %  Brain natriuretic peptide     Status: Abnormal   Collection Time: 02/04/19  4:30 PM  Result Value Ref Range   B Natriuretic Peptide 1,514.0 (H) 0.0 - 100.0 pg/mL  Urinalysis, Complete w Microscopic     Status: Abnormal   Collection Time: 02/04/19  7:21 PM  Result Value Ref Range   Color, Urine AMBER (A) YELLOW   APPearance CLEAR (A) CLEAR   Specific Gravity, Urine 1.039 (H) 1.005 - 1.030   pH 6.0 5.0 - 8.0   Glucose, UA NEGATIVE NEGATIVE mg/dL   Hgb urine dipstick NEGATIVE NEGATIVE   Bilirubin Urine NEGATIVE NEGATIVE   Ketones, ur NEGATIVE NEGATIVE mg/dL   Protein, ur 100 (A) NEGATIVE mg/dL   Nitrite NEGATIVE NEGATIVE   Leukocytes,Ua NEGATIVE NEGATIVE   RBC / HPF 0-5 0 - 5 RBC/hpf   WBC, UA 0-5 0 - 5 WBC/hpf   Bacteria, UA NONE SEEN NONE SEEN   Squamous Epithelial / LPF 0-5 0 - 5   Mucus PRESENT   Influenza panel by PCR (type A & B)     Status: None   Collection Time: 02/04/19  7:21 PM  Result Value Ref Range   Influenza A By PCR NEGATIVE NEGATIVE   Influenza B By PCR NEGATIVE NEGATIVE   ____________________________________________ ____________________________________  RADIOLOGY  I personally reviewed all radiographic images ordered to evaluate for the above acute complaints and reviewed radiology reports and findings.  These findings were personally discussed with the patient.  Please see medical record for radiology report.  ____________________________________________   PROCEDURES  Procedure(s) performed:  Procedures    Critical Care performed: no ____________________________________________   INITIAL IMPRESSION / ASSESSMENT AND PLAN / ED COURSE  Pertinent labs & imaging results that were available during my care of the patient  were reviewed by me and considered in my medical decision making (see chart for details).   DDX: Colitis, stone, pneumonia, flu, dehydration, edema, colitis, diverticulitis, abscess  Gary Bentley is a 48 y.o. who presents to the ED with symptoms as described above.  Chest x-ray as well as CT imaging of the abdomen will be ordered for the by differential.  Patient without any hypoxia.  Has low-grade temperature but no fever.  Does have increasing white count.  Does have left flank pain which I have low suspicion for stone versus [place] versus diverticulitis.  Clinical Course as of Feb 04 2044  Sat Feb 04, 2019  2036 BNP is roughly at his baseline.  He does not have any hypoxia at this time.  Still waiting urinalysis.  Labs currently down.  Very well may be have pyelonephritis.   [PR]    Clinical Course User Index [PR] Merlyn Lot, MD   Patient will be signed out to oncoming physician pending urinalysis.  If able to ambulate without any hypoxia he does have prescription for Lasix at home will be stable and appropriate for discharge as long as he is able to tolerate antibiotic.  As part of my medical decision making, I reviewed the following data within the [facility] notes reviewed and incorporated, Labs reviewed, notes from prior ED visits and [place] Controlled Substance Database   ____________________________________________   FINAL CLINICAL IMPRESSION(S) / ED DIAGNOSES  Final diagnoses:  Acute left flank pain  Edema, unspecified type      NEW MEDICATIONS STARTED DURING THIS VISIT:  New Prescriptions   No medications on file     Note:  This document was prepared using Dragon voice recognition software and may include unintentional dictation errors.    Merlyn Lot, MD 02/04/19 2046

## 2019-02-04 NOTE — ED Triage Notes (Signed)
Pt to ED via POV c/o abdominal pain and cough x a few days. Pt states that he has also had N/V/D. Pt states that he has had fevers and chills as well. Pt is afebrile in triage, pt denies taking any OTC medication for fever. Pt is in NAD.

## 2019-02-04 NOTE — ED Notes (Signed)
Pt able to walk to nurses' station without difficulty or assistance. Pt HR stays at 105 and oxygen sat stays at 98-100% while ambulating.

## 2019-02-04 NOTE — ED Provider Notes (Signed)
b ----------------------------------------- 10:03 PM on 02/04/2019 -----------------------------------------  Patient signed out to me at 9, if he is able to ambulate without the setting he is to be discharged no further work-up is indicated according to Dr. Quentin Cornwall.  Patient ambulated with no difficulty sats stayed at 98, we will discharge him per Dr. Ruffin Frederick plan using his discharge instructions that are already prepared.  Extensive return precautions and follow-up given understood patient is eager to go home he is nontoxic   Schuyler Amor, MD 02/04/19 2204

## 2019-02-06 LAB — URINE CULTURE: Culture: <10000 — AB

## 2019-02-14 DIAGNOSIS — L97512 Non-pressure chronic ulcer of other part of right foot with fat layer exposed: Secondary | ICD-10-CM | POA: Diagnosis not present

## 2019-02-14 DIAGNOSIS — B351 Tinea unguium: Secondary | ICD-10-CM | POA: Diagnosis not present

## 2019-02-14 DIAGNOSIS — L97511 Non-pressure chronic ulcer of other part of right foot limited to breakdown of skin: Secondary | ICD-10-CM | POA: Diagnosis not present

## 2019-02-14 DIAGNOSIS — E1142 Type 2 diabetes mellitus with diabetic polyneuropathy: Secondary | ICD-10-CM | POA: Diagnosis not present

## 2019-02-14 DIAGNOSIS — L97521 Non-pressure chronic ulcer of other part of left foot limited to breakdown of skin: Secondary | ICD-10-CM | POA: Diagnosis not present

## 2019-03-14 ENCOUNTER — Telehealth: Payer: Self-pay | Admitting: Cardiovascular Disease

## 2019-03-14 NOTE — Telephone Encounter (Signed)
Virtual Visit Pre-Appointment Phone Call  "(Name), I am calling you today to discuss your upcoming appointment. We are currently trying to limit exposure to the virus that causes COVID-19 by seeing patients at home rather than in the office."  1. "What is the BEST phone number to call the day of the visit?" - include this in appointment notes  2. Do you have or have access to (through a family member/friend) a smartphone with video capability that we can use for your visit?" a. If yes - list this number in appt notes as cell (if different from BEST phone #) and list the appointment type as a VIDEO visit in appointment notes b. If no - list the appointment type as a PHONE visit in appointment notes  3. Confirm consent - "In the setting of the current Covid19 crisis, you are scheduled for a (phone or video) visit with your provider on (date) at (time).  Just as we do with many in-office visits, in order for you to participate in this visit, we must obtain consent.  If you'd like, I can send this to your mychart (if signed up) or email for you to review.  Otherwise, I can obtain your verbal consent now.  All virtual visits are billed to your insurance company just like a normal visit would be.  By agreeing to a virtual visit, we'd like you to understand that the technology does not allow for your provider to perform an examination, and thus may limit your provider's ability to fully assess your condition. If your provider identifies any concerns that need to be evaluated in person, we will make arrangements to do so.  Finally, though the technology is pretty good, we cannot assure that it will always work on either your or our end, and in the setting of a video visit, we may have to convert it to a phone-only visit.  In either situation, we cannot ensure that we have a secure connection.  Are you willing to proceed?" STAFF: Did the patient verbally acknowledge consent to telehealth visit? Document  YES/NO here: yes  4. Advise patient to be prepared - "Two hours prior to your appointment, go ahead and check your blood pressure, pulse, oxygen saturation, and your weight (if you have the equipment to check those) and write them all down. When your visit starts, your provider will ask you for this information. If you have an Apple Watch or Kardia device, please plan to have heart rate information ready on the day of your appointment. Please have a pen and paper handy nearby the day of the visit as well."  5. Give patient instructions for MyChart download to smartphone OR Doximity/Doxy.me as below if video visit (depending on what platform provider is using)  6. Inform patient they will receive a phone call 15 minutes prior to their appointment time (may be from unknown caller ID) so they should be prepared to answer    TELEPHONE CALL NOTE  Gary Bentley has been deemed a candidate for a follow-up tele-health visit to limit community exposure during the Covid-19 pandemic. I spoke with the patient via phone to ensure availability of phone/video source, confirm preferred email & phone number, and discuss instructions and expectations.  I reminded Gary Bentley to be prepared with any vital sign and/or heart rhythm information that could potentially be obtained via home monitoring, at the time of his visit. I reminded Gary Bentley to expect a phone call prior to his visit.  Gary Bentley 03/14/2019 3:11 PM   INSTRUCTIONS FOR DOWNLOADING THE MYCHART APP TO SMARTPHONE  - The patient must first make sure to have activated MyChart and know their login information - If Apple, go to [employer] and type in MyChart in the search bar and download the app. If Android, ask patient to go to [employer] and type in [place] in the search bar and download the app. The app is free but as with any other app downloads, their phone may require them to verify saved payment information or Apple/Android  password.  - The patient will need to then log into the app with their MyChart username and password, and select [facility] as their healthcare provider to link the account. When it is time for your visit, go to the MyChart app, find appointments, and click Begin Video Visit. Be sure to Select Allow for your device to access the Microphone and Camera for your visit. You will then be connected, and your provider will be with you shortly.  **If they have any issues connecting, or need assistance please contact MyChart service desk [phone])**  **If using a computer, in order to ensure the best quality for their visit they will need to use either of the following Internet Browsers: [employer], or Google Chrome**  IF USING DOXIMITY or DOXY.ME - The patient will receive a link just prior to their visit by text.     FULL LENGTH CONSENT FOR TELE-HEALTH VISIT   I hereby voluntarily request, consent and authorize [facility] and its employed or contracted physicians, physician assistants, nurse practitioners or other licensed health care professionals (the Practitioner), to provide me with telemedicine health care services (the Services") as deemed necessary by the treating Practitioner. I acknowledge and consent to receive the Services by the Practitioner via telemedicine. I understand that the telemedicine visit will involve communicating with the Practitioner through live audiovisual communication technology and the disclosure of certain medical information by electronic transmission. I acknowledge that I have been given the opportunity to request an in-person assessment or other available alternative prior to the telemedicine visit and am voluntarily participating in the telemedicine visit.  I understand that I have the right to withhold or withdraw my consent to the use of telemedicine in the course of my care at any time, without affecting my right to future care or treatment,  and that the Practitioner or I may terminate the telemedicine visit at any time. I understand that I have the right to inspect all information obtained and/or recorded in the course of the telemedicine visit and may receive copies of available information for a reasonable fee.  I understand that some of the potential risks of receiving the Services via telemedicine include:   Delay or interruption in medical evaluation due to technological equipment failure or disruption;  Information transmitted may not be sufficient (e.g. poor resolution of images) to allow for appropriate medical decision making by the Practitioner; and/or   In rare instances, security protocols could fail, causing a breach of personal health information.  Furthermore, I acknowledge that it is my responsibility to provide information about my medical history, conditions and care that is complete and accurate to the best of my ability. I acknowledge that Practitioner's advice, recommendations, and/or decision may be based on factors not within their control, such as incomplete or inaccurate data provided by me or distortions of diagnostic images or specimens that may result from electronic transmissions. I understand that the  practice of medicine is not an Chief Strategy Officer and that Practitioner makes no warranties or guarantees regarding treatment outcomes. I acknowledge that I will receive a copy of this consent concurrently upon execution via email to the email address I last provided but may also request a printed copy by calling the office of [facility].    I understand that my insurance will be billed for this visit.   I have read or had this consent read to me.  I understand the contents of this consent, which adequately explains the benefits and risks of the Services being provided via telemedicine.   I have been provided ample opportunity to ask questions regarding this consent and the Services and have had my questions  answered to my satisfaction.  I give my informed consent for the services to be provided through the use of telemedicine in my medical care  By participating in this telemedicine visit I agree to the above.

## 2019-03-15 ENCOUNTER — Other Ambulatory Visit: Payer: Self-pay

## 2019-03-15 ENCOUNTER — Telehealth: Payer: 59 | Admitting: Cardiovascular Disease

## 2019-03-21 DIAGNOSIS — Z794 Long term (current) use of insulin: Secondary | ICD-10-CM | POA: Diagnosis not present

## 2019-03-21 DIAGNOSIS — L97521 Non-pressure chronic ulcer of other part of left foot limited to breakdown of skin: Secondary | ICD-10-CM | POA: Diagnosis not present

## 2019-03-21 DIAGNOSIS — E1142 Type 2 diabetes mellitus with diabetic polyneuropathy: Secondary | ICD-10-CM | POA: Diagnosis not present

## 2019-03-21 DIAGNOSIS — R600 Localized edema: Secondary | ICD-10-CM | POA: Diagnosis not present

## 2019-03-21 DIAGNOSIS — R0602 Shortness of breath: Secondary | ICD-10-CM | POA: Diagnosis not present

## 2019-03-21 DIAGNOSIS — I10 Essential (primary) hypertension: Secondary | ICD-10-CM | POA: Diagnosis not present

## 2019-03-21 DIAGNOSIS — E119 Type 2 diabetes mellitus without complications: Secondary | ICD-10-CM | POA: Diagnosis not present

## 2019-03-21 DIAGNOSIS — L97511 Non-pressure chronic ulcer of other part of right foot limited to breakdown of skin: Secondary | ICD-10-CM | POA: Diagnosis not present

## 2019-03-24 DIAGNOSIS — R0602 Shortness of breath: Secondary | ICD-10-CM | POA: Diagnosis not present

## 2019-03-26 ENCOUNTER — Other Ambulatory Visit: Payer: Self-pay

## 2019-03-26 ENCOUNTER — Encounter: Payer: Self-pay | Admitting: Emergency Medicine

## 2019-03-26 ENCOUNTER — Inpatient Hospital Stay
Admission: EM | Admit: 2019-03-26 | Discharge: 2019-03-29 | DRG: 291 | Disposition: A | Discharge status: Home / Self Care | Payer: 59 | Attending: Specialist | Admitting: Specialist

## 2019-03-26 ENCOUNTER — Emergency Department: Payer: 59

## 2019-03-26 DIAGNOSIS — Z79899 Other long term (current) drug therapy: Secondary | ICD-10-CM

## 2019-03-26 DIAGNOSIS — I272 Pulmonary hypertension, unspecified: Secondary | ICD-10-CM | POA: Diagnosis present

## 2019-03-26 DIAGNOSIS — Z23 Encounter for immunization: Secondary | ICD-10-CM | POA: Diagnosis not present

## 2019-03-26 DIAGNOSIS — Z87891 Personal history of nicotine dependence: Secondary | ICD-10-CM

## 2019-03-26 DIAGNOSIS — E119 Type 2 diabetes mellitus without complications: Secondary | ICD-10-CM | POA: Diagnosis present

## 2019-03-26 DIAGNOSIS — Z6830 Body mass index (BMI) 30.0-30.9, adult: Secondary | ICD-10-CM

## 2019-03-26 DIAGNOSIS — R609 Edema, unspecified: Secondary | ICD-10-CM | POA: Diagnosis not present

## 2019-03-26 DIAGNOSIS — I110 Hypertensive heart disease with heart failure: Secondary | ICD-10-CM | POA: Diagnosis not present

## 2019-03-26 DIAGNOSIS — R0602 Shortness of breath: Secondary | ICD-10-CM | POA: Diagnosis not present

## 2019-03-26 DIAGNOSIS — Z794 Long term (current) use of insulin: Secondary | ICD-10-CM

## 2019-03-26 DIAGNOSIS — I5043 Acute on chronic combined systolic (congestive) and diastolic (congestive) heart failure: Secondary | ICD-10-CM | POA: Diagnosis present

## 2019-03-26 DIAGNOSIS — I428 Other cardiomyopathies: Secondary | ICD-10-CM | POA: Diagnosis present

## 2019-03-26 DIAGNOSIS — E8809 Other disorders of plasma-protein metabolism, not elsewhere classified: Secondary | ICD-10-CM | POA: Diagnosis present

## 2019-03-26 DIAGNOSIS — I509 Heart failure, unspecified: Secondary | ICD-10-CM | POA: Diagnosis not present

## 2019-03-26 DIAGNOSIS — Z1159 Encounter for screening for other viral diseases: Secondary | ICD-10-CM | POA: Diagnosis not present

## 2019-03-26 DIAGNOSIS — K760 Fatty (change of) liver, not elsewhere classified: Secondary | ICD-10-CM | POA: Diagnosis present

## 2019-03-26 DIAGNOSIS — R600 Localized edema: Secondary | ICD-10-CM

## 2019-03-26 DIAGNOSIS — I5023 Acute on chronic systolic (congestive) heart failure: Secondary | ICD-10-CM | POA: Diagnosis not present

## 2019-03-26 DIAGNOSIS — I248 Other forms of acute ischemic heart disease: Secondary | ICD-10-CM | POA: Diagnosis present

## 2019-03-26 DIAGNOSIS — Q256 Stenosis of pulmonary artery: Secondary | ICD-10-CM | POA: Diagnosis not present

## 2019-03-26 DIAGNOSIS — I472 Ventricular tachycardia: Secondary | ICD-10-CM | POA: Diagnosis present

## 2019-03-26 DIAGNOSIS — E669 Obesity, unspecified: Secondary | ICD-10-CM | POA: Diagnosis present

## 2019-03-26 DIAGNOSIS — Z9114 Patient's other noncompliance with medication regimen: Secondary | ICD-10-CM

## 2019-03-26 DIAGNOSIS — R7989 Other specified abnormal findings of blood chemistry: Secondary | ICD-10-CM | POA: Diagnosis not present

## 2019-03-26 DIAGNOSIS — R778 Other specified abnormalities of plasma proteins: Secondary | ICD-10-CM

## 2019-03-26 DIAGNOSIS — I351 Nonrheumatic aortic (valve) insufficiency: Secondary | ICD-10-CM | POA: Diagnosis present

## 2019-03-26 DIAGNOSIS — D649 Anemia, unspecified: Secondary | ICD-10-CM | POA: Diagnosis present

## 2019-03-26 LAB — CBC WITH DIFFERENTIAL/PLATELET
Abs Immature Granulocytes: 0.01 10*3/uL (ref 0.00–0.07)
Basophils Absolute: 0.1 10*3/uL (ref 0.0–0.1)
Basophils Relative: 1 %
Eosinophils Absolute: 0.2 10*3/uL (ref 0.0–0.5)
Eosinophils Relative: 4 %
HCT: 37.8 % — ABNORMAL LOW (ref 39.0–52.0)
Hemoglobin: 11.9 g/dL — ABNORMAL LOW (ref 13.0–17.0)
Immature Granulocytes: 0 %
Lymphocytes Relative: 35 %
Lymphs Abs: 1.9 10*3/uL (ref 0.7–4.0)
MCH: 31.8 pg (ref 26.0–34.0)
MCHC: 31.5 g/dL (ref 30.0–36.0)
MCV: 101.1 fL — ABNORMAL HIGH (ref 80.0–100.0)
Monocytes Absolute: 0.6 10*3/uL (ref 0.1–1.0)
Monocytes Relative: 11 %
Neutro Abs: 2.7 10*3/uL (ref 1.7–7.7)
Neutrophils Relative %: 49 %
Platelets: 358 10*3/uL (ref 150–400)
RBC: 3.74 MIL/uL — ABNORMAL LOW (ref 4.22–5.81)
RDW: 15.2 % (ref 11.5–15.5)
WBC: 5.5 10*3/uL (ref 4.0–10.5)
nRBC: 0 % (ref 0.0–0.2)

## 2019-03-26 LAB — COMPREHENSIVE METABOLIC PANEL
ALT: 23 U/L (ref 0–44)
AST: 24 U/L (ref 15–41)
Albumin: 3.2 g/dL — ABNORMAL LOW (ref 3.5–5.0)
Alkaline Phosphatase: 104 U/L (ref 38–126)
Anion gap: 7 (ref 5–15)
BUN: 12 mg/dL (ref 6–20)
CO2: 24 mmol/L (ref 22–32)
Calcium: 8.2 mg/dL — ABNORMAL LOW (ref 8.9–10.3)
Chloride: 108 mmol/L (ref 98–111)
Creatinine, Ser: 1.01 mg/dL (ref 0.61–1.24)
GFR calc Af Amer: >60 mL/min (ref >60)
GFR calc non Af Amer: >60 mL/min (ref >60)
Glucose, Bld: 155 mg/dL — ABNORMAL HIGH (ref 70–99)
Potassium: 3.9 mmol/L (ref 3.5–5.1)
Sodium: 139 mmol/L (ref 135–145)
Total Bilirubin: 1.1 mg/dL (ref 0.3–1.2)
Total Protein: 6.7 g/dL (ref 6.5–8.1)

## 2019-03-26 LAB — HEMOGLOBIN A1C
Hgb A1c MFr Bld: 5.2 % (ref 4.8–5.6)
Mean Plasma Glucose: 102.54 mg/dL

## 2019-03-26 LAB — GLUCOSE, CAPILLARY
Glucose-Capillary: 91 mg/dL (ref 70–99)
Glucose-Capillary: 109 mg/dL — ABNORMAL HIGH (ref 70–99)
Glucose-Capillary: 96 mg/dL (ref 70–99)
Glucose-Capillary: 120 mg/dL — ABNORMAL HIGH (ref 70–99)

## 2019-03-26 LAB — BRAIN NATRIURETIC PEPTIDE: B Natriuretic Peptide: 1808 pg/mL — ABNORMAL HIGH (ref 0.0–100.0)

## 2019-03-26 LAB — TROPONIN I: Troponin I: 0.03 ng/mL (ref <0.03)

## 2019-03-26 LAB — TSH: TSH: 0.845 u[IU]/mL (ref 0.350–4.500)

## 2019-03-26 LAB — SARS CORONAVIRUS 2 BY RT PCR (HOSPITAL ORDER, PERFORMED IN ~~LOC~~ HOSPITAL LAB): SARS Coronavirus 2: NEGATIVE

## 2019-03-26 MED ORDER — FUROSEMIDE 10 MG/ML IJ SOLN
40.0000 mg | Freq: Four times a day (QID) | INTRAMUSCULAR | Status: DC
  Administered 2019-03-26 – 2019-03-27 (×4): 40 mg via INTRAVENOUS
  Filled 2019-03-26 (×4): qty 4

## 2019-03-26 MED ORDER — DOCUSATE SODIUM 100 MG PO CAPS
100.0000 mg | ORAL_CAPSULE | Freq: Two times a day (BID) | ORAL | Status: DC
  Administered 2019-03-26 – 2019-03-27 (×3): 100 mg via ORAL
  Filled 2019-03-26 (×6): qty 1

## 2019-03-26 MED ORDER — ASPIRIN 81 MG PO CHEW
324.0000 mg | CHEWABLE_TABLET | Freq: Once | ORAL | Status: AC
  Administered 2019-03-26: 324 mg via ORAL

## 2019-03-26 MED ORDER — ACETAMINOPHEN 650 MG RE SUPP: 650.0000 mg | Freq: Four times a day (QID) | RECTAL | Status: DC | PRN

## 2019-03-26 MED ORDER — INSULIN ASPART 100 UNIT/ML ~~LOC~~ SOLN
0.0000 [IU] | Freq: Three times a day (TID) | SUBCUTANEOUS | Status: DC
  Administered 2019-03-27 (×2): 1 [IU] via SUBCUTANEOUS
  Administered 2019-03-28 – 2019-03-29 (×3): 2 [IU] via SUBCUTANEOUS
  Filled 2019-03-26 (×5): qty 1

## 2019-03-26 MED ORDER — FUROSEMIDE 10 MG/ML IJ SOLN
40.0000 mg | Freq: Once | INTRAMUSCULAR | Status: AC
  Administered 2019-03-26: 04:00:00 40 mg via INTRAVENOUS

## 2019-03-26 MED ORDER — PNEUMOCOCCAL VAC POLYVALENT 25 MCG/0.5ML IJ INJ
0.5000 mL | INJECTION | INTRAMUSCULAR | Status: AC
  Administered 2019-03-27: 0.5 mL via INTRAMUSCULAR
  Filled 2019-03-26: qty 0.5

## 2019-03-26 MED ORDER — METOPROLOL SUCCINATE ER 25 MG PO TB24
25.0000 mg | ORAL_TABLET | Freq: Every day | ORAL | Status: DC
  Administered 2019-03-27 – 2019-03-28 (×2): 25 mg via ORAL
  Filled 2019-03-26 (×3): qty 1

## 2019-03-26 MED ORDER — ONDANSETRON HCL 4 MG/2ML IJ SOLN: 4.0000 mg | Freq: Four times a day (QID) | INTRAMUSCULAR | Status: DC | PRN

## 2019-03-26 MED ORDER — ACETAMINOPHEN 325 MG PO TABS
650.0000 mg | ORAL_TABLET | Freq: Four times a day (QID) | ORAL | Status: DC | PRN
  Administered 2019-03-28: 650 mg via ORAL
  Filled 2019-03-26: qty 2

## 2019-03-26 MED ORDER — ASPIRIN 81 MG PO CHEW
CHEWABLE_TABLET | ORAL | Status: AC
  Filled 2019-03-26: qty 4

## 2019-03-26 MED ORDER — LOSARTAN POTASSIUM 25 MG PO TABS
25.0000 mg | ORAL_TABLET | Freq: Every day | ORAL | Status: DC
  Administered 2019-03-27 – 2019-03-28 (×2): 25 mg via ORAL
  Filled 2019-03-26 (×3): qty 1

## 2019-03-26 MED ORDER — CARVEDILOL 3.125 MG PO TABS: 3.1250 mg | ORAL_TABLET | Freq: Two times a day (BID) | ORAL | Status: DC

## 2019-03-26 MED ORDER — ENOXAPARIN SODIUM 40 MG/0.4ML ~~LOC~~ SOLN
40.0000 mg | SUBCUTANEOUS | Status: DC
  Administered 2019-03-26 – 2019-03-29 (×4): 40 mg via SUBCUTANEOUS
  Filled 2019-03-26 (×4): qty 0.4

## 2019-03-26 MED ORDER — FUROSEMIDE 10 MG/ML IJ SOLN
INTRAMUSCULAR | Status: AC
  Filled 2019-03-26: qty 4

## 2019-03-26 MED ORDER — INSULIN ASPART 100 UNIT/ML ~~LOC~~ SOLN: 0.0000 [IU] | Freq: Every day | SUBCUTANEOUS | Status: DC

## 2019-03-26 MED ORDER — LISINOPRIL 10 MG PO TABS
10.0000 mg | ORAL_TABLET | Freq: Every day | ORAL | Status: DC
  Administered 2019-03-26: 10 mg via ORAL
  Filled 2019-03-26: qty 1

## 2019-03-26 MED ORDER — ONDANSETRON HCL 4 MG PO TABS: 4.0000 mg | ORAL_TABLET | Freq: Four times a day (QID) | ORAL | Status: DC | PRN

## 2019-03-26 MED ORDER — POTASSIUM CHLORIDE CRYS ER 20 MEQ PO TBCR
40.0000 meq | EXTENDED_RELEASE_TABLET | Freq: Every day | ORAL | Status: AC
  Administered 2019-03-27 – 2019-03-28 (×2): 40 meq via ORAL
  Filled 2019-03-26 (×2): qty 2

## 2019-03-26 NOTE — ED Notes (Signed)
Per Wynona Meals In laboratory, patient's troponin is 0.03. MD made aware.

## 2019-03-26 NOTE — ED Notes (Signed)
Attempted to call report. Nurse on floor unavailable.

## 2019-03-26 NOTE — ED Notes (Signed)
Pt with 3+ bilateral ankle and pedal edema extending to tibial.

## 2019-03-26 NOTE — ED Notes (Signed)
ED TO INPATIENT HANDOFF REPORT  ED Nurse Name and Phone #:  April/Khyron Garno [phone]  S Name/Age/Gender Gary Bentley 48 y.o. male Room/Bed: ED19A/ED19A  Code Status   Code Status: Prior  Home/SNF/Other  Patient oriented to: self, place, time and situation Is this baseline? Yes   Triage Complete: Triage complete  Chief Complaint short of breath swelling legs and feet  Triage Note Pt reports shortness of breath and peripheral edema; says he's been feeling bad all week; has seen his provider for same and his lasix was reduced from 40mg  to 20mg ; pt reports shortness of breath even when laying down; talking in complete sentences;    Allergies No Known Allergies  Level of Care/Admitting Diagnosis ED Disposition    ED Disposition Condition [facility]: [place] [[id number]]  Level of Care: Telemetry [5]  Covid Evaluation: N/A  Diagnosis: Acute on chronic systolic CHF (congestive heart failure) [facility]) [[id number]]  Admitting Physician: Gary Bentley [[id number]]  Attending Physician: Gary Bentley [[id number]]  Estimated length of stay: past midnight tomorrow  Certification:: I certify this patient will need inpatient services for at least 2 midnights  PT Class (Do Not Modify): Inpatient [101]  PT Acc Code (Do Not Modify): Private [1]       B Medical/Surgery History Past Medical History:  Diagnosis Date  . Diabetes mellitus without complication [facility])    Past Surgical History:  Procedure Laterality Date  . FOOT SURGERY    . RIGHT/LEFT HEART CATH AND CORONARY ANGIOGRAPHY N/A 01/13/2019   Procedure: RIGHT/LEFT HEART CATH AND CORONARY ANGIOGRAPHY;  Surgeon: Gary Merritts, MD;  Location: [facility];  Service: Cardiovascular;  Laterality: N/A;     A IV Location/Drains/Wounds Patient Lines/Drains/Airways Status   Active Line/Drains/Airways    Name:   Placement date:   Placement time:   Site:   Days:   Peripheral IV  03/26/19 Right Antecubital   03/26/19    0421    Antecubital   less than 1          Intake/Output Last 24 hours  Intake/Output Summary (Last 24 hours) at 03/26/2019 0626 Last data filed at 03/26/2019 0500 Gross per 24 hour  Intake -  Output 300 ml  Net -300 ml    Labs/Imaging Results for orders placed or performed during the hospital encounter of 03/26/19 (from the past 48 hour(s))  CBC with Differential     Status: Abnormal   Collection Time: 03/26/19  4:13 AM  Result Value Ref Range   WBC 5.5 4.0 - 10.5 K/uL   RBC 3.74 (L) 4.22 - 5.81 MIL/uL   Hemoglobin 11.9 (L) 13.0 - 17.0 g/dL   HCT 37.8 (L) 39.0 - 52.0 %   MCV 101.1 (H) 80.0 - 100.0 fL   MCH 31.8 26.0 - 34.0 pg   MCHC 31.5 30.0 - 36.0 g/dL   RDW 15.2 11.5 - 15.5 %   Platelets 358 150 - 400 K/uL   nRBC 0.0 0.0 - 0.2 %   Neutrophils Relative % 49 %   Neutro Abs 2.7 1.7 - 7.7 K/uL   Lymphocytes Relative 35 %   Lymphs Abs 1.9 0.7 - 4.0 K/uL   Monocytes Relative 11 %   Monocytes Absolute 0.6 0.1 - 1.0 K/uL   Eosinophils Relative 4 %   Eosinophils Absolute 0.2 0.0 - 0.5 K/uL   Basophils Relative 1 %   Basophils Absolute 0.1 0.0 - 0.1 K/uL   Immature  Granulocytes 0 %   Abs Immature Granulocytes 0.01 0.00 - 0.07 K/uL    Comment: Performed at [facility], [address] [place], [place] [address]  Comprehensive metabolic panel     Status: Abnormal   Collection Time: 03/26/19  4:13 AM  Result Value Ref Range   Sodium 139 135 - 145 mmol/L   Potassium 3.9 3.5 - 5.1 mmol/L   Chloride 108 98 - 111 mmol/L   CO2 24 22 - 32 mmol/L   Glucose, Bld 155 (H) 70 - 99 mg/dL   BUN 12 6 - 20 mg/dL   Creatinine, Ser 1.01 0.61 - 1.24 mg/dL   Calcium 8.2 (L) 8.9 - 10.3 mg/dL   Total Protein 6.7 6.5 - 8.1 g/dL   Albumin 3.2 (L) 3.5 - 5.0 g/dL   AST 24 15 - 41 U/L   ALT 23 0 - 44 U/L   Alkaline Phosphatase 104 38 - 126 U/L   Total Bilirubin 1.1 0.3 - 1.2 mg/dL   GFR calc non Af Amer >60 >60 mL/min   GFR calc Af Amer >60  >60 mL/min   Anion gap 7 5 - 15    Comment: Performed at [facility], [address] [place], [place] [address]  Brain natriuretic peptide     Status: Abnormal   Collection Time: 03/26/19  4:13 AM  Result Value Ref Range   B Natriuretic Peptide 1,808.0 (H) 0.0 - 100.0 pg/mL    Comment: Performed at [facility], [address] [place], [place] [address]  Troponin I - Once     Status: Abnormal   Collection Time: 03/26/19  4:13 AM  Result Value Ref Range   Troponin I 0.03 (HH) <0.03 ng/mL    Comment: CRITICAL RESULT CALLED TO, READ BACK BY AND VERIFIED WITH Gary Bentley AT 0509 ON 03/26/2019 JJB Performed at [facility], [address] [place], [place] [address]   SARS Coronavirus 2 [facility] order, Performed in [place] hospital lab)     Status: None   Collection Time: 03/26/19  4:21 AM  Result Value Ref Range   SARS Coronavirus 2 NEGATIVE NEGATIVE    Comment: (NOTE) If result is NEGATIVE SARS-CoV-2 target nucleic acids are NOT DETECTED. The SARS-CoV-2 RNA is generally detectable in upper and lower  respiratory specimens during the acute phase of infection. The lowest  concentration of SARS-CoV-2 viral copies this assay can detect is 250  copies / mL. A negative result does not preclude SARS-CoV-2 infection  and should not be used as the sole basis for treatment or other  patient management decisions.  A negative result may occur with  improper specimen collection / handling, submission of specimen other  than nasopharyngeal swab, presence of viral mutation(s) within the  areas targeted by this assay, and inadequate number of viral copies  (<250 copies / mL). A negative result must be combined with clinical  observations, patient history, and epidemiological information. If result is POSITIVE SARS-CoV-2 target nucleic acids are DETECTED. The SARS-CoV-2 RNA is generally detectable in upper and lower  respiratory specimens dur ing the acute phase  of infection.  Positive  results are indicative of active infection with SARS-CoV-2.  Clinical  correlation with patient history and other diagnostic information is  necessary to determine patient infection status.  Positive results do  not rule out bacterial infection or co-infection with other viruses. If result is PRESUMPTIVE POSTIVE SARS-CoV-2 nucleic acids MAY BE PRESENT.   A presumptive positive  result was obtained on the submitted specimen  and confirmed on repeat testing.  While 2019 novel coronavirus  (SARS-CoV-2) nucleic acids may be present in the submitted sample  additional confirmatory testing may be necessary for epidemiological  and / or clinical management purposes  to differentiate between  SARS-CoV-2 and other Sarbecovirus currently known to infect humans.  If clinically indicated additional testing with an alternate test  methodology [phone]) is advised. The SARS-CoV-2 RNA is generally  detectable in upper and lower respiratory sp ecimens during the acute  phase of infection. The expected result is Negative. Fact Sheet for Patients:  StrictlyIdeas.no Fact Sheet for Healthcare Providers: BankingDealers.co.za This test is not yet approved or cleared by the [place] FDA and has been authorized for detection and/or diagnosis of SARS-CoV-2 by FDA under an Emergency Use Authorization (EUA).  This EUA will remain in effect (meaning this test can be used) for the duration of the COVID-19 declaration under Section 564(b)(1) of the Act, 21 U.S.C. section 360bbb-3(b)(1), unless the authorization is terminated or revoked sooner. Performed at [facility], [address], [place], [place] [address]    Dg Chest Port 1 View  Result Date: 03/26/2019 CLINICAL DATA:  Shortness of breath and lower extremity edema. EXAM: PORTABLE CHEST 1 VIEW COMPARISON:  Chest x-ray dated February 04, 2019. FINDINGS: Stable cardiomegaly.  Normal mediastinal contours. New mild diffuse interstitial thickening. Mild bibasilar atelectasis. No focal consolidation, pleural effusion, or pneumothorax. No acute osseous abnormality. IMPRESSION: 1. Stable cardiomegaly with new mild interstitial pulmonary edema. Electronically Signed   By: Titus Dubin M.D.   On: 03/26/2019 05:15    Pending Labs [employer] (From admission, onward)    Start     Ordered   Signed and Held  Creatinine, serum  (enoxaparin (LOVENOX)    CrCl >/= 30 ml/min)  Weekly,   R    Comments:  while on enoxaparin therapy    Signed and Held   Signed and Held  TSH  Once,   R     Signed and Held   Signed and Held  Hemoglobin A1c  Add-on,   R     Signed and Held          Vitals/Pain Today's Vitals   03/26/19 0409 03/26/19 0410 03/26/19 0459 03/26/19 0606  BP:   125/90 (!) 122/92  Pulse:   90 85  Resp:   19 (!) 26  Temp:      TempSrc:      SpO2:   99% 98%  Weight:  116.6 kg    Height:  6\' 5"  (1.956 m)    PainSc: 7        Isolation Precautions Droplet and Contact precautions  Medications Medications  furosemide (LASIX) injection 40 mg (40 mg Intravenous Given 03/26/19 0427)  aspirin chewable tablet 324 mg (324 mg Oral Given 03/26/19 0426)    Mobility walks Low fall risk   Focused Assessments Cardiac Assessment Handoff:    Lab Results  Component Value Date   TROPONINI 0.03 ([place]) 03/26/2019   No results found for: DDIMER Does the Patient currently have chest pain? No     R Recommendations: See Admitting Provider Note  Report given to:   Additional Notes:

## 2019-03-26 NOTE — ED Notes (Signed)
Patient walked around room on pulse oximetry for 5 minutes. Patient's Sp02 stayed at or above 97%.

## 2019-03-26 NOTE — ED Provider Notes (Signed)
[facility] Emergency Department Provider Note   ____________________________________________   First MD Initiated Contact with Patient 03/26/19 0407     (approximate)  I have reviewed the triage vital signs and the nursing notes.   HISTORY  Chief Complaint Shortness of Breath and Leg Swelling    HPI Gary Bentley is a 48 y.o. male who presents to the ED from home with a chief complaint of shortness of breath and BLE edema.  Patient had a hospitalization in 12/2018 for CHF exacerbation.  Echocardiogram revealed ejection fraction of 15 to 20%.  He was started on Lasix 40 mg.  States he ran out and had not followed up until this past week when he saw his PCP who restarted Lasix 20 mg.  Patient reports generalized malaise all week and increased shortness of breath on exertion and laying down.  Symptoms associated with chest tightness.  Denies fever, cough, abdominal pain, nausea, vomiting or diaphoresis.  Denies recent travel, trauma or exposure to persons diagnosed with coronavirus.       Past Medical History:  Diagnosis Date   Diabetes mellitus without complication [facility])     Patient Active Problem List   Diagnosis Date Noted   Acute on chronic systolic CHF (congestive heart failure) ([place]) 03/26/2019   Influenza B 01/09/2019   Cellulitis 03/22/2018   Osteomyelitis ([place]) 09/04/2017   Diabetes mellitus due to underlying condition with foot ulcer (CODE) ([place]) 09/04/2017   Hyperglycemia 09/04/2017   Foot pain, bilateral 09/04/2017    Past Surgical History:  Procedure Laterality Date   FOOT SURGERY     RIGHT/LEFT HEART CATH AND CORONARY ANGIOGRAPHY N/A 01/13/2019   Procedure: RIGHT/LEFT HEART CATH AND CORONARY ANGIOGRAPHY;  Surgeon: Minna Merritts, MD;  Location: [place] [facility];  Service: Cardiovascular;  Laterality: N/A;    Prior to Admission medications   Medication Sig Start Date End Date Taking? Authorizing Provider  furosemide  (LASIX) 40 MG tablet Take 1 tablet (40 mg total) by mouth daily. Patient taking differently: Take 20 mg by mouth daily.  02/04/19  Yes Schuyler Amor, MD  insulin aspart protamine - aspart (NOVOLOG MIX 70/30 FLEXPEN) (70-30) 100 UNIT/ML FlexPen Inject 0.1 mLs (10 Units total) into the skin 2 (two) times daily. 01/15/19  Yes Dustin Flock, MD  lisinopril (ZESTRIL) 10 MG tablet Take 10 mg by mouth daily. 03/21/19   [provider]    Allergies Patient has no known allergies.  Family History  Family history unknown: Yes    Social History Social History   Tobacco Use   Smoking status: Former Smoker    Packs/day: 1.00    Types: Cigarettes    Last attempt to quit: 04/17/2017    Years since quitting: 1.9   Smokeless tobacco: Never Used  Substance Use Topics   Alcohol use: No   Drug use: No    Review of Systems  Constitutional: No fever/chills Eyes: No visual changes. ENT: No sore throat. Cardiovascular: Positive for chest pain. Respiratory: Positive for shortness of breath. Gastrointestinal: No abdominal pain.  No nausea, no vomiting.  No diarrhea.  No constipation. Genitourinary: Negative for dysuria. Musculoskeletal: Positive for BLE edema.  Negative for back pain. Skin: Negative for rash. Neurological: Negative for headaches, focal weakness or numbness.   ____________________________________________   PHYSICAL EXAM:  VITAL SIGNS: ED Triage Vitals  Enc Vitals Group     BP      Pulse      Resp  Temp      Temp src      SpO2      Weight      Height      Head Circumference      Peak Flow      Pain Score      Pain Loc      Pain Edu?      Excl. in [place]?     Constitutional: Alert and oriented. Well appearing and in mild acute distress. Eyes: Conjunctivae are normal. PERRL. EOMI. Head: Atraumatic. Nose: No congestion/rhinnorhea. Mouth/Throat: Mucous membranes are moist.  Oropharynx non-erythematous. Neck: No stridor.   Cardiovascular: Normal  rate, regular rhythm. Grossly normal heart sounds.  Good peripheral circulation. Respiratory: Increased respiratory effort.  No retractions. Lungs with bibasilar rales. Gastrointestinal: Soft and nontender. No distention. No abdominal bruits. No CVA tenderness. Musculoskeletal: No lower extremity tenderness. 2+ pitting BLE edema.  No joint effusions. Neurologic:  Normal speech and language. No gross focal neurologic deficits are appreciated.  Skin:  Skin is warm, dry and intact. No rash noted. Psychiatric: Mood and affect are normal. Speech and behavior are normal.  ____________________________________________   LABS (all labs ordered are listed, but only abnormal results are displayed)  Labs Reviewed  CBC WITH DIFFERENTIAL/PLATELET - Abnormal; Notable for the following components:      Result Value   RBC 3.74 (*)    Hemoglobin 11.9 (*)    HCT 37.8 (*)    MCV 101.1 (*)    All other components within normal limits  COMPREHENSIVE METABOLIC PANEL - Abnormal; Notable for the following components:   Glucose, Bld 155 (*)    Calcium 8.2 (*)    Albumin 3.2 (*)    All other components within normal limits  BRAIN NATRIURETIC PEPTIDE - Abnormal; Notable for the following components:   B Natriuretic Peptide 1,808.0 (*)    All other components within normal limits  TROPONIN I - Abnormal; Notable for the following components:   Troponin I 0.03 (*)    All other components within normal limits  SARS CORONAVIRUS 2 (HOSPITAL ORDER, [place] [facility])   ____________________________________________  EKG  ED ECG REPORT I, Mehtab Dolberry J, the attending physician, personally viewed and interpreted this ECG.   Date: 03/26/2019  EKG Time: 0407  Rate: 91  Rhythm: normal EKG, normal sinus rhythm  Axis: LAD  Intervals:left anterior fascicular block  ST&T Change: Nonspecific  ____________________________________________  RADIOLOGY  ED MD interpretation: Pulmonary  edema  Official radiology report(s): Dg Chest Port 1 View  Result Date: 03/26/2019 CLINICAL DATA:  Shortness of breath and lower extremity edema. EXAM: PORTABLE CHEST 1 VIEW COMPARISON:  Chest x-ray dated February 04, 2019. FINDINGS: Stable cardiomegaly. Normal mediastinal contours. New mild diffuse interstitial thickening. Mild bibasilar atelectasis. No focal consolidation, pleural effusion, or pneumothorax. No acute osseous abnormality. IMPRESSION: 1. Stable cardiomegaly with new mild interstitial pulmonary edema. Electronically Signed   By: Titus Dubin M.D.   On: 03/26/2019 05:15    ____________________________________________   PROCEDURES  Procedure(s) performed (including Critical Care):  Procedures  CRITICAL CARE Performed by: Paulette Blanch   Total critical care time: 30 minutes  Critical care time was exclusive of separately billable procedures and treating other patients.  Critical care was necessary to treat or prevent imminent or life-threatening deterioration.  Critical care was time spent personally by me on the following activities: development of treatment plan with patient and/or surrogate as well as nursing, discussions with consultants, evaluation of patient's  response to treatment, examination of patient, obtaining history from patient or surrogate, ordering and performing treatments and interventions, ordering and review of laboratory studies, ordering and review of radiographic studies, pulse oximetry and re-evaluation of patient's condition. ____________________________________________   INITIAL IMPRESSION / ASSESSMENT AND PLAN / ED COURSE  As part of my medical decision making, I reviewed the following data within the [facility] notes reviewed and incorporated, Labs reviewed, EKG interpreted, Old chart reviewed, Discussed with admitting physician and Notes from prior ED visits     Gary Bentley was evaluated in Emergency Department on  03/26/2019 for the symptoms described in the history of present illness. He was evaluated in the context of the global COVID-19 pandemic, which necessitated consideration that the patient might be at risk for infection with the SARS-CoV-2 virus that causes COVID-19. Institutional protocols and algorithms that pertain to the evaluation of patients at risk for COVID-19 are in a state of rapid change based on information released by regulatory bodies including the CDC and federal and state organizations. These policies and algorithms were followed during the patient's care in the ED.   48 year old male with shortness of breath, history of CHF with EF 15% who has been out of Lasix for a couple months. Differential includes, but is not limited to, viral syndrome, bronchitis including COPD exacerbation, pneumonia, reactive airway disease including asthma, CHF including exacerbation with or without pulmonary/interstitial edema, pneumothorax, ACS, thoracic trauma, and pulmonary embolism.  Will obtain cardiac panel, administer aspirin, IV Lasix.  Anticipate hospitalization based on patient's work of breathing.   Clinical Course as of Mar 25 634  Sun Mar 26, 2019  0455 Patient ambulated without hypoxia but was quite tachypneic.   [JS]  0093 Noted elevated troponin.  Discussed with hospitalist Dr. Marcille Blanco who will evaluate patient in the emergency department for admission.   [JS]    Clinical Course User Index [JS] Paulette Blanch, MD     ____________________________________________   FINAL CLINICAL IMPRESSION(S) / ED DIAGNOSES  Final diagnoses:  Peripheral edema  Shortness of breath  Acute on chronic congestive heart failure, unspecified heart failure type ([place])  Elevated troponin     ED Discharge Orders    None       Note:  This document was prepared using Dragon voice recognition software and may include unintentional dictation errors.   Paulette Blanch, MD 03/26/19 [phone]

## 2019-03-26 NOTE — Progress Notes (Signed)
[place] [facility] [place] NAME: Gary Bentley    MR#:  [id number]  DATE OF BIRTH:  11/02/1972  SUBJECTIVE:   Patient admitted to the hospital secondary shortness of breath and worsening lower extreme edema noted to be in congestive heart failure.  Denies any chest pains, fever chills nausea or vomiting.  REVIEW OF SYSTEMS:    Review of Systems  Constitutional: Negative for chills and fever.  HENT: Negative for congestion and tinnitus.   Eyes: Negative for blurred vision and double vision.  Respiratory: Positive for shortness of breath. Negative for cough and wheezing.   Cardiovascular: Positive for leg swelling. Negative for chest pain, orthopnea and PND.  Gastrointestinal: Negative for abdominal pain, diarrhea, nausea and vomiting.  Genitourinary: Negative for dysuria and hematuria.  Neurological: Negative for dizziness, sensory change and focal weakness.  All other systems reviewed and are negative.   Nutrition: Renal, Heart healthy Tolerating Diet: Yes Tolerating PT: Ambulatory     DRUG ALLERGIES:  No Known Allergies  VITALS:  Blood pressure (!) 111/99, pulse 87, temperature 97.9 F (36.6 C), temperature source Oral, resp. rate 19, height 6\' 5"  (1.956 m), weight 116.6 kg, SpO2 100 %.  PHYSICAL EXAMINATION:   Physical Exam  GENERAL:  48 y.o.-year-old patient lying in bed in no acute distress.  EYES: Pupils equal, round, reactive to light and accommodation. No scleral icterus. Extraocular muscles intact.  HEENT: Head atraumatic, normocephalic. Oropharynx and nasopharynx clear.  NECK:  Supple, no jugular venous distention. No thyroid enlargement, no tenderness.  LUNGS: Normal breath sounds bilaterally, no wheezing, bibasilar rales, rhonchi. No use of accessory muscles of respiration.  CARDIOVASCULAR: S1, S2 normal. No murmurs, rubs, or gallops.  ABDOMEN: Soft, nontender, nondistended. Bowel sounds present. No organomegaly or  mass.  EXTREMITIES: No cyanosis, clubbing, +2 edema b/l.    NEUROLOGIC: Cranial nerves II through XII are intact. No focal Motor or sensory deficits b/l.   PSYCHIATRIC: The patient is alert and oriented x 3.  SKIN: No obvious rash, lesion, or ulcer.    LABORATORY PANEL:   CBC Recent Labs  Lab 03/26/19 0413  WBC 5.5  HGB 11.9*  HCT 37.8*  PLT 358   ------------------------------------------------------------------------------------------------------------------  Chemistries  Recent Labs  Lab 03/26/19 0413  NA 139  K 3.9  CL 108  CO2 24  GLUCOSE 155*  BUN 12  CREATININE 1.01  CALCIUM 8.2*  AST 24  ALT 23  ALKPHOS 104  BILITOT 1.1   ------------------------------------------------------------------------------------------------------------------  Cardiac Enzymes Recent Labs  Lab 03/26/19 0413  TROPONINI 0.03*   ------------------------------------------------------------------------------------------------------------------  RADIOLOGY:  Dg Chest Port 1 View  Result Date: 03/26/2019 CLINICAL DATA:  Shortness of breath and lower extremity edema. EXAM: PORTABLE CHEST 1 VIEW COMPARISON:  Chest x-ray dated February 04, 2019. FINDINGS: Stable cardiomegaly. Normal mediastinal contours. New mild diffuse interstitial thickening. Mild bibasilar atelectasis. No focal consolidation, pleural effusion, or pneumothorax. No acute osseous abnormality. IMPRESSION: 1. Stable cardiomegaly with new mild interstitial pulmonary edema. Electronically Signed   By: Titus Dubin M.D.   On: 03/26/2019 05:15     ASSESSMENT AND PLAN:   48 year old male with past medical history of diabetes, nonischemic cardiomyopathy with EF of 20% who presented to the hospital due to shortness of breath and lower extremity edema.  1.  CHF-acute on chronic systolic dysfunction. -This is a cause of patient's worsening shortness of breath and lower extremity edema. - Continue diuresis with IV Lasix, follow  I's and  O's and daily weights. -Await cardiology input.  Continue lisinopril.  2.  Essential hypertension-continue lisinopril.  3.  Diabetes type 2 without complication-continue sliding scale insulin. -Blood sugar stable.    All the records are reviewed and case discussed with Care Management/Social Worker. Management plans discussed with the patient, family and they are in agreement.  CODE STATUS: Full code  DVT Prophylaxis: Lovenox  TOTAL TIME TAKING CARE OF THIS PATIENT: 30 minutes.   POSSIBLE D/C IN 1-2 DAYS, DEPENDING ON CLINICAL CONDITION.   Henreitta Leber M.D on 03/26/2019 at 11:37 AM  Between 7am to 6pm - Pager - [phone]  After 6pm go to [email or web address] - Proofreader  [facility]  Office  [phone]  CC: Primary care physician; Patient, No Pcp Per

## 2019-03-26 NOTE — Consult Note (Signed)
Cardiology Consultation:   Patient ID: Gary Bentley MRN: [id number]; DOB: 11/02/1972  Admit date: 03/26/2019 Date of Consult: 03/26/2019  Primary Care Provider: Patient, No Pcp Per Primary Cardiologist: [name], MD  Primary Electrophysiologist:  None    Patient Profile:   Gary Bentley is a 48 y.o. male with a hx of chronic systolic and diastolic heart failure, poorly controlled diabetes, and prior tobacco abuse who is being seen today for the evaluation of acute on chronic systolic and diastolic heart failure at the request of Dr. Marcille Blanco.  History of Present Illness:   Gary Bentley reports a week of increasing dyspnea and lower extremity edema.  He has not experienced any chest pain or pressure.  He reports orthopnea and feels as though his abdomen is more distended.  He does not have a scale and does not weigh himself at home.  He cooks most of his meals at home and tries not to add much salt.  He does not follow a strict salt or fluid restriction.  Given his progressive dyspnea he presented to the emergency department.  In the ED blood pressure was 130/99 with a heart rate of 93.  Respiratory rate was 27 and he was satting 100% on room air.  Chest x-ray was notable for cardiomegaly with mild pulmonary edema.  BNP was elevated to 1800.  Troponin was minimally elevated to 0.03.  COVID-19 was negative.  Gary Bentley was admitted 12/2018 with new onset heart failure in the setting of influenza B infection.  LVEF that admission was 15-20%.  Since discharge he ran out of his medications approximately 1 month ago.  He did not show up for cardiology follow up on 4/22.  He reports that he is a [employer] and was with a customer at the time of his appointment. He saw his PCP recently and lasix was started at 20mg  instead of 40mg .  However his dyspnea continued to worsen.  Past Medical History:  Diagnosis Date  . Diabetes mellitus without complication [facility])     Past Surgical History:   Procedure Laterality Date  . FOOT SURGERY    . RIGHT/LEFT HEART CATH AND CORONARY ANGIOGRAPHY N/A 01/13/2019   Procedure: RIGHT/LEFT HEART CATH AND CORONARY ANGIOGRAPHY;  Surgeon: Minna Merritts, MD;  Location: [place] [facility];  Service: Cardiovascular;  Laterality: N/A;     Home Medications:  Prior to Admission medications   Medication Sig Start Date End Date Taking? Authorizing Provider  furosemide (LASIX) 40 MG tablet Take 1 tablet (40 mg total) by mouth daily. Patient taking differently: Take 20 mg by mouth daily.  02/04/19  Yes Schuyler Amor, MD  insulin aspart protamine - aspart (NOVOLOG MIX 70/30 FLEXPEN) (70-30) 100 UNIT/ML FlexPen Inject 0.1 mLs (10 Units total) into the skin 2 (two) times daily. 01/15/19  Yes Dustin Flock, MD  lisinopril (ZESTRIL) 10 MG tablet Take 10 mg by mouth daily. 03/21/19   [provider]    Inpatient Medications: Scheduled Meds: . docusate sodium  100 mg Oral BID  . enoxaparin (LOVENOX) injection  40 mg Subcutaneous Q24H  . furosemide  40 mg Intravenous Q6H  . insulin aspart  0-5 Units Subcutaneous QHS  . insulin aspart  0-9 Units Subcutaneous TID WC  . [START ON 03/27/2019] potassium chloride  40 mEq Oral Daily   Continuous Infusions:  PRN Meds: acetaminophen **OR** acetaminophen, ondansetron **OR** ondansetron (ZOFRAN) IV  Allergies:   No Known Allergies  Social History:   Social History  Socioeconomic History  . Marital status: Single    Spouse name: Not on file  . Number of children: Not on file  . Years of education: Not on file  . Highest education level: Not on file  Occupational History  . Not on file  Social Needs  . Financial resource strain: Not on file  . Food insecurity:    Worry: Not on file    Inability: Not on file  . Transportation needs:    Medical: Not on file    Non-medical: Not on file  Tobacco Use  . Smoking status: Former Smoker    Packs/day: 1.00    Types: Cigarettes    Last attempt to  quit: 04/17/2017    Years since quitting: 1.9  . Smokeless tobacco: Never Used  Substance and Sexual Activity  . Alcohol use: No  . Drug use: No  . Sexual activity: Not on file  Lifestyle  . Physical activity:    Days per week: Not on file    Minutes per session: Not on file  . Stress: Not on file  Relationships  . Social connections:    Talks on phone: Not on file    Gets together: Not on file    Attends religious service: Not on file    Active member of club or organization: Not on file    Attends meetings of clubs or organizations: Not on file    Relationship status: Not on file  . Intimate partner violence:    Fear of current or ex partner: Not on file    Emotionally abused: Not on file    Physically abused: Not on file    Forced sexual activity: Not on file  Other Topics Concern  . Not on file  Social History Narrative   No place to live at this time, independent otherwise    Family History:    Family History  Family history unknown: Yes     ROS:  Please see the history of present illness.   All other ROS reviewed and negative.     Physical Exam/Data:   Vitals:   03/26/19 0630 03/26/19 0652 03/26/19 0730 03/26/19 0807  BP: 118/89  119/90 (!) 111/99  Pulse: 90 82 82 87  Resp: (!) 30 (!) 28 11 19   Temp:    97.9 F (36.6 C)  TempSrc:    Oral  SpO2: 100% 99% 100% 100%  Weight:      Height:        Intake/Output Summary (Last 24 hours) at 03/26/2019 0928 Last data filed at 03/26/2019 0640 Gross per 24 hour  Intake -  Output 1300 ml  Net -1300 ml   Last 3 Weights 03/26/2019 02/04/2019 01/15/2019  Weight (lbs) 257 lb 235 lb 137 lb 3.2 oz  Weight (kg) 116.574 kg 106.595 kg 62.234 kg     VS:  BP (!) 111/99 (BP Location: Left Arm)   Pulse 87   Temp 97.9 F (36.6 C) (Oral)   Resp 19   Ht 6\' 5"  (1.956 m)   Wt 116.6 kg   SpO2 100%   BMI 30.48 kg/m  , BMI Body mass index is 30.48 kg/m. GENERAL:  Well appearing HEENT: Pupils equal round and reactive, fundi  not visualized, oral mucosa unremarkable NECK:  + jugular venous distention to mid neck at 45 degrees.  +HJR.  Waveform within normal limits, carotid upstroke brisk and symmetric, no bruits LUNGS:  Clear to auscultation bilaterally HEART:  RRR.  PMI not  displaced or sustained,S1 and S2 within normal limits, no S3, no S4, no clicks, no rubs, no murmurs ABD:  Flat, positive bowel sounds normal in frequency in pitch, no bruits, no rebound, no guarding, no midline pulsatile mass, no hepatomegaly, no splenomegaly EXT:  2 plus pulses throughout, 2+ LE edema, no cyanosis no clubbing SKIN:  No rashes no nodules NEURO:  Cranial nerves II through XII grossly intact, motor grossly intact throughout PSYCH:  Cognitively intact, oriented to person place and time 3  EKG:  The EKG was personally reviewed and demonstrates: Sinus tachycardia.  Rate 103 bpm.  LAFB.  LVH with secondary repolarization abnormality.  Telemetry:  Telemetry was personally reviewed and demonstrates: Sinus rhythm.  No events  Relevant CV Studies:  Echo 01/10/19: IMPRESSIONS    1. The left ventricle has a visually estimated ejection fraction of of 15-20%%. The cavity size was mildly dilated. There is moderately increased left ventricular wall thickness. Left ventricular diastology could not be evaluated Left ventricular  diffuse hypokinesis.  2. Left ventricular systolic function is severely reduced.  3. The right ventricle has severely reduced systolic function. The cavity was moderately enlarged. There is no increase in right ventricular wall thickness.  4. Left atrial size was severely dilated.  5. Right atrial size was moderately dilated.  6. The mitral valve is normal in structure.  7. The tricuspid valve is normal in structure.  8. The aortic valve is tricuspid Aortic valve regurgitation is trivial by color flow Doppler.  9. Mild pulmonic stenosis. 10. The aortic root is normal in size and structure. 11. The interatrial  septum was not well visualized.  LHC/RHC 12/2018: No CAD LVEF 20%.  Diffuse hypokinesis.  RA: 7 mean RV 443/10/17 PA: 40/21/29 PCWP: 19 C/O: 5.04 C.I: 2.07  Laboratory Data:  Chemistry Recent Labs  Lab 03/26/19 0413  NA 139  K 3.9  CL 108  CO2 24  GLUCOSE 155*  BUN 12  CREATININE 1.01  CALCIUM 8.2*  GFRNONAA >60  GFRAA >60  ANIONGAP 7    Recent Labs  Lab 03/26/19 0413  PROT 6.7  ALBUMIN 3.2*  AST 24  ALT 23  ALKPHOS 104  BILITOT 1.1   Hematology Recent Labs  Lab 03/26/19 0413  WBC 5.5  RBC 3.74*  HGB 11.9*  HCT 37.8*  MCV 101.1*  MCH 31.8  MCHC 31.5  RDW 15.2  PLT 358   Cardiac Enzymes Recent Labs  Lab 03/26/19 0413  TROPONINI 0.03*   No results for input(s): TROPIPOC in the last 168 hours.  BNP Recent Labs  Lab 03/26/19 0413  BNP 1,808.0*    DDimer No results for input(s): DDIMER in the last 168 hours.  Radiology/Studies:  Dg Chest Port 1 View  Result Date: 03/26/2019 CLINICAL DATA:  Shortness of breath and lower extremity edema. EXAM: PORTABLE CHEST 1 VIEW COMPARISON:  Chest x-ray dated February 04, 2019. FINDINGS: Stable cardiomegaly. Normal mediastinal contours. New mild diffuse interstitial thickening. Mild bibasilar atelectasis. No focal consolidation, pleural effusion, or pneumothorax. No acute osseous abnormality. IMPRESSION: 1. Stable cardiomegaly with new mild interstitial pulmonary edema. Electronically Signed   By: Titus Dubin M.D.   On: 03/26/2019 05:15    Assessment and Plan:   # Acute on chronic systolic and diastolic heart failure: Gary Bentley has known systolic dysfunction with LVEF 20% on recent hospitalization 12/2018.  He has been out of his medications and has not followed up with cardiology as an outpatient.  He reports 1 week of  increasing dyspnea, orthopnea, and edema.  His admission weight is 116.6 kg, up from 110.6 kg at the time of his discharge.  We will continue diuresis with IV Lasix.  We will switch lisinopril  to losartan with plans to get him on Entresto this admission.  No need to repeat echo at this time.  He will need an echo repeated after 3 months of guideline directed medical therapy.  Given that he has not been taking his medications there is no need to reassess yet.  We discussed the importance of taking his medications as ordered.  We will also start metoprolol succinate 25 mg daily.  Strict I/Os and daily weights.   # Demand ischemia: Troponin is very minimally elevated.  This is demand ischemia in the setting of acute on chronic heart failure.  He has no chest pain.  No repeat ischemic evaluation, as he had no CAD on his left cath.       For questions or updates, please contact [facility] Please consult [email or web address] for contact info under     Signed, Skeet Latch, MD  03/26/2019 9:28 AM

## 2019-03-26 NOTE — H&P (Signed)
Gary Bentley is an 48 y.o. male.   Chief Complaint: Shortness of breath HPI: The patient with past medical history of diabetes and heart failure presents to the emergency department complaining of shortness of breath.  The patient reports weakness and dyspnea that have progressively worsened over the last week.  Denies travel risk factors or known contact with individuals with exposure to novel coronavirus.  He also reports swelling of his lower extremities.  The patient was started on Lasix 40 mg daily as an outpatient following his last hospitalization in February 2020.  His primary care doctor recently decreased his Lasix dose to 20 mg daily.  Chest x-ray shows mild interstitial edema.  The patient received a dose of IV Lasix as well as 324 mg of aspirin.  (The the patient does not endorse chest pain to me).  He did not require supplemental oxygen.  Once he was stabilized emergency department staff call hospitalist service for admission.  Past Medical History:  Diagnosis Date  . Diabetes mellitus without complication [facility])     Past Surgical History:  Procedure Laterality Date  . FOOT SURGERY    . RIGHT/LEFT HEART CATH AND CORONARY ANGIOGRAPHY N/A 01/13/2019   Procedure: RIGHT/LEFT HEART CATH AND CORONARY ANGIOGRAPHY;  Surgeon: Minna Merritts, MD;  Location: [place] [facility];  Service: Cardiovascular;  Laterality: N/A;    Family History  Family history unknown: Yes   Social History:  reports that he quit smoking about 23 months ago. His smoking use included cigarettes. He smoked 1.00 pack per day. He has never used smokeless tobacco. He reports that he does not drink alcohol or use drugs.  Allergies: No Known Allergies  Prior to Admission medications   Medication Sig Start Date End Date Taking? Authorizing Provider  furosemide (LASIX) 40 MG tablet Take 1 tablet (40 mg total) by mouth daily. Patient taking differently: Take 20 mg by mouth daily.  02/04/19  Yes Schuyler Amor, MD   insulin aspart protamine - aspart (NOVOLOG MIX 70/30 FLEXPEN) (70-30) 100 UNIT/ML FlexPen Inject 0.1 mLs (10 Units total) into the skin 2 (two) times daily. 01/15/19  Yes Dustin Flock, MD  lisinopril (ZESTRIL) 10 MG tablet Take 10 mg by mouth daily. 03/21/19   [provider]     Results for orders placed or performed during the hospital encounter of 03/26/19 (from the past 48 hour(s))  CBC with Differential     Status: Abnormal   Collection Time: 03/26/19  4:13 AM  Result Value Ref Range   WBC 5.5 4.0 - 10.5 K/uL   RBC 3.74 (L) 4.22 - 5.81 MIL/uL   Hemoglobin 11.9 (L) 13.0 - 17.0 g/dL   HCT 37.8 (L) 39.0 - 52.0 %   MCV 101.1 (H) 80.0 - 100.0 fL   MCH 31.8 26.0 - 34.0 pg   MCHC 31.5 30.0 - 36.0 g/dL   RDW 15.2 11.5 - 15.5 %   Platelets 358 150 - 400 K/uL   nRBC 0.0 0.0 - 0.2 %   Neutrophils Relative % 49 %   Neutro Abs 2.7 1.7 - 7.7 K/uL   Lymphocytes Relative 35 %   Lymphs Abs 1.9 0.7 - 4.0 K/uL   Monocytes Relative 11 %   Monocytes Absolute 0.6 0.1 - 1.0 K/uL   Eosinophils Relative 4 %   Eosinophils Absolute 0.2 0.0 - 0.5 K/uL   Basophils Relative 1 %   Basophils Absolute 0.1 0.0 - 0.1 K/uL   Immature Granulocytes 0 %  Abs Immature Granulocytes 0.01 0.00 - 0.07 K/uL    Comment: Performed at [facility], [address] [place], [place] [address]  Comprehensive metabolic panel     Status: Abnormal   Collection Time: 03/26/19  4:13 AM  Result Value Ref Range   Sodium 139 135 - 145 mmol/L   Potassium 3.9 3.5 - 5.1 mmol/L   Chloride 108 98 - 111 mmol/L   CO2 24 22 - 32 mmol/L   Glucose, Bld 155 (H) 70 - 99 mg/dL   BUN 12 6 - 20 mg/dL   Creatinine, Ser 1.01 0.61 - 1.24 mg/dL   Calcium 8.2 (L) 8.9 - 10.3 mg/dL   Total Protein 6.7 6.5 - 8.1 g/dL   Albumin 3.2 (L) 3.5 - 5.0 g/dL   AST 24 15 - 41 U/L   ALT 23 0 - 44 U/L   Alkaline Phosphatase 104 38 - 126 U/L   Total Bilirubin 1.1 0.3 - 1.2 mg/dL   GFR calc non Af Amer >60 >60 mL/min   GFR calc Af  Amer >60 >60 mL/min   Anion gap 7 5 - 15    Comment: Performed at [facility], [address], [place], [place] [address]  Brain natriuretic peptide     Status: Abnormal   Collection Time: 03/26/19  4:13 AM  Result Value Ref Range   B Natriuretic Peptide 1,808.0 (H) 0.0 - 100.0 pg/mL    Comment: Performed at [facility], [address] [place], [place] [address]  Troponin I - Once     Status: Abnormal   Collection Time: 03/26/19  4:13 AM  Result Value Ref Range   Troponin I 0.03 (HH) <0.03 ng/mL    Comment: CRITICAL RESULT CALLED TO, READ BACK BY AND VERIFIED WITH DANIEL LEWIS AT 0509 ON 03/26/2019 JJB Performed at [facility], [address], [place], [place] [address]   SARS Coronavirus 2 [facility] order, Performed in [place] hospital lab)     Status: None   Collection Time: 03/26/19  4:21 AM  Result Value Ref Range   SARS Coronavirus 2 NEGATIVE NEGATIVE    Comment: (NOTE) If result is NEGATIVE SARS-CoV-2 target nucleic acids are NOT DETECTED. The SARS-CoV-2 RNA is generally detectable in upper and lower  respiratory specimens during the acute phase of infection. The lowest  concentration of SARS-CoV-2 viral copies this assay can detect is 250  copies / mL. A negative result does not preclude SARS-CoV-2 infection  and should not be used as the sole basis for treatment or other  patient management decisions.  A negative result may occur with  improper specimen collection / handling, submission of specimen other  than nasopharyngeal swab, presence of viral mutation(s) within the  areas targeted by this assay, and inadequate number of viral copies  (<250 copies / mL). A negative result must be combined with clinical  observations, patient history, and epidemiological information. If result is POSITIVE SARS-CoV-2 target nucleic acids are DETECTED. The SARS-CoV-2 RNA is generally detectable in upper and lower  respiratory specimens dur ing the  acute phase of infection.  Positive  results are indicative of active infection with SARS-CoV-2.  Clinical  correlation with patient history and other diagnostic information is  necessary to determine patient infection status.  Positive results do  not rule out bacterial infection or co-infection with other viruses. If result is PRESUMPTIVE POSTIVE SARS-CoV-2 nucleic acids MAY BE PRESENT.   A presumptive positive result was obtained on the  submitted specimen  and confirmed on repeat testing.  While 2019 novel coronavirus  (SARS-CoV-2) nucleic acids may be present in the submitted sample  additional confirmatory testing may be necessary for epidemiological  and / or clinical management purposes  to differentiate between  SARS-CoV-2 and other Sarbecovirus currently known to infect humans.  If clinically indicated additional testing with an alternate test  methodology [phone]) is advised. The SARS-CoV-2 RNA is generally  detectable in upper and lower respiratory sp ecimens during the acute  phase of infection. The expected result is Negative. Fact Sheet for Patients:  StrictlyIdeas.no Fact Sheet for Healthcare Providers: BankingDealers.co.za This test is not yet approved or cleared by the [place] FDA and has been authorized for detection and/or diagnosis of SARS-CoV-2 by FDA under an Emergency Use Authorization (EUA).  This EUA will remain in effect (meaning this test can be used) for the duration of the COVID-19 declaration under Section 564(b)(1) of the Act, 21 U.S.C. section 360bbb-3(b)(1), unless the authorization is terminated or revoked sooner. Performed at [facility], [address] [place], [place] [address]    Dg Chest Port 1 View  Result Date: 03/26/2019 CLINICAL DATA:  Shortness of breath and lower extremity edema. EXAM: PORTABLE CHEST 1 VIEW COMPARISON:  Chest x-ray dated February 04, 2019. FINDINGS: Stable  cardiomegaly. Normal mediastinal contours. New mild diffuse interstitial thickening. Mild bibasilar atelectasis. No focal consolidation, pleural effusion, or pneumothorax. No acute osseous abnormality. IMPRESSION: 1. Stable cardiomegaly with new mild interstitial pulmonary edema. Electronically Signed   By: Titus Dubin M.D.   On: 03/26/2019 05:15    Review of Systems  Constitutional: Negative for chills and fever.  HENT: Negative for sore throat and tinnitus.   Eyes: Negative for blurred vision and redness.  Respiratory: Positive for shortness of breath. Negative for cough.   Cardiovascular: Negative for chest pain, palpitations, orthopnea and PND.  Gastrointestinal: Negative for abdominal pain, diarrhea, nausea and vomiting.  Genitourinary: Negative for dysuria, frequency and urgency.  Musculoskeletal: Negative for joint pain and myalgias.  Skin: Negative for rash.       No lesions  Neurological: Positive for weakness. Negative for speech change and focal weakness.  Endo/Heme/Allergies: Does not bruise/bleed easily.       No temperature intolerance  Psychiatric/Behavioral: Negative for depression and suicidal ideas.    Blood pressure 118/89, pulse 82, temperature 97.7 F (36.5 C), temperature source Oral, resp. rate (!) 28, height 6\' 5"  (1.956 m), weight 116.6 kg, SpO2 99 %. Physical Exam  Constitutional: He is oriented to person, place, and time. He appears well-developed and well-nourished. No distress.  HENT:  Head: Normocephalic and atraumatic.  Mouth/Throat: Oropharynx is clear and moist.  Eyes: Pupils are equal, round, and reactive to light. Conjunctivae and EOM are normal. No scleral icterus.  Neck: Normal range of motion. Neck supple. No JVD present. No tracheal deviation present. No thyromegaly present.  Cardiovascular: Normal rate and regular rhythm. Exam reveals no gallop and no friction rub.  No murmur heard. Respiratory: Effort normal and breath sounds normal. No  respiratory distress.  GI: Soft. Bowel sounds are normal. He exhibits no distension. There is no abdominal tenderness.  Genitourinary:    Genitourinary Comments: Deferred   Musculoskeletal: Normal range of motion.        General: No edema.  Lymphadenopathy:    He has no cervical adenopathy.  Neurological: He is alert and oriented to person, place, and time. No cranial nerve deficit.  Skin: Skin is warm  and dry. No rash noted. No erythema.  Psychiatric: He has a normal mood and affect. His behavior is normal. Judgment and thought content normal.     Assessment/Plan This is a 47 year old male admitted for heart failure. 1.  CHF: Acute on chronic; systolic.  EF 15 to 04% in February 2020.  Continue diuresis with IV Lasix.  Limit fluid intake.  Supplement potassium in anticipation of potassium wasting from loop diuretic. 2.  Elevated troponin: Likely secondary to demand ischemia from elevated respiratory rate.  Continue to follow cardiac enzymes.  Monitor telemetry. 3.  Diabetes mellitus type 2: Previous A1c greater than 12; sliding scale insulin while hospitalized. 4.  Obesity: BMI is 30.4; likely represents some water weight.  Nonetheless encouraged healthy diet and exercise. 5.  DVT prophylaxis: Lovenox 6.  GI prophylaxis: None The patient is a full code.  Time spent on admission orders and patient care approximately 45 minutes  [name], MD 03/26/2019, 7:07 AM

## 2019-03-26 NOTE — ED Triage Notes (Addendum)
Pt reports shortness of breath and peripheral edema; says he's been feeling bad all week; has seen his provider for same and his lasix was reduced from 40mg  to 20mg ; pt reports shortness of breath even when laying down; talking in complete sentences;

## 2019-03-26 NOTE — ED Notes (Signed)
Report given to Kim, RN.

## 2019-03-27 LAB — BASIC METABOLIC PANEL
Anion gap: 8 (ref 5–15)
BUN: 13 mg/dL (ref 6–20)
CO2: 27 mmol/L (ref 22–32)
Calcium: 8.6 mg/dL — ABNORMAL LOW (ref 8.9–10.3)
Chloride: 104 mmol/L (ref 98–111)
Creatinine, Ser: 1.23 mg/dL (ref 0.61–1.24)
GFR calc Af Amer: >60 mL/min (ref >60)
GFR calc non Af Amer: >60 mL/min (ref >60)
Glucose, Bld: 131 mg/dL — ABNORMAL HIGH (ref 70–99)
Potassium: 4 mmol/L (ref 3.5–5.1)
Sodium: 139 mmol/L (ref 135–145)

## 2019-03-27 LAB — GLUCOSE, CAPILLARY
Glucose-Capillary: 106 mg/dL — ABNORMAL HIGH (ref 70–99)
Glucose-Capillary: 132 mg/dL — ABNORMAL HIGH (ref 70–99)
Glucose-Capillary: 134 mg/dL — ABNORMAL HIGH (ref 70–99)
Glucose-Capillary: 112 mg/dL — ABNORMAL HIGH (ref 70–99)

## 2019-03-27 MED ORDER — FUROSEMIDE 10 MG/ML IJ SOLN
40.0000 mg | Freq: Two times a day (BID) | INTRAMUSCULAR | Status: DC
  Administered 2019-03-27 – 2019-03-29 (×4): 40 mg via INTRAVENOUS
  Filled 2019-03-27 (×4): qty 4

## 2019-03-27 MED ORDER — SODIUM CHLORIDE 0.9% FLUSH: 3.0000 mL | INTRAVENOUS | Status: DC | PRN

## 2019-03-27 MED ORDER — SODIUM CHLORIDE 0.9% FLUSH
3.0000 mL | Freq: Two times a day (BID) | INTRAVENOUS | Status: DC
  Administered 2019-03-27 – 2019-03-29 (×5): 3 mL via INTRAVENOUS

## 2019-03-27 MED ORDER — ENSURE ENLIVE PO LIQD
237.0000 mL | Freq: Two times a day (BID) | ORAL | Status: DC
  Administered 2019-03-27 – 2019-03-29 (×4): 237 mL via ORAL

## 2019-03-27 NOTE — Progress Notes (Signed)
[facility] NAME: Gary Bentley    MR#:  [id number]  DATE OF BIRTH:  20-Aug-1971  SUBJECTIVE:   Patient admitted to the hospital secondary shortness of breath and worsening lower extremity edema noted to be in congestive heart failure.  Diuresing well with IV lasix and feels better. Still has some Lower Ext. Edema.   REVIEW OF SYSTEMS:    Review of Systems  Constitutional: Negative for chills and fever.  HENT: Negative for congestion and tinnitus.   Eyes: Negative for blurred vision and double vision.  Respiratory: Positive for shortness of breath. Negative for cough and wheezing.   Cardiovascular: Positive for leg swelling. Negative for chest pain, orthopnea and PND.  Gastrointestinal: Negative for abdominal pain, diarrhea, nausea and vomiting.  Genitourinary: Negative for dysuria and hematuria.  Neurological: Negative for dizziness, sensory change and focal weakness.  All other systems reviewed and are negative.   Nutrition: Renal, Heart healthy Tolerating Diet: Yes Tolerating PT: Ambulatory     DRUG ALLERGIES:  No Known Allergies  VITALS:  Blood pressure (!) 116/91, pulse 89, temperature 98.4 F (36.9 C), temperature source Oral, resp. rate 20, height 6\' 5"  (1.956 m), weight 112.2 kg, SpO2 93 %.  PHYSICAL EXAMINATION:   Physical Exam  GENERAL:  48 y.o.-year-old patient lying in bed in no acute distress.  EYES: Pupils equal, round, reactive to light and accommodation. No scleral icterus. Extraocular muscles intact.  HEENT: Head atraumatic, normocephalic. Oropharynx and nasopharynx clear.  NECK:  Supple, no jugular venous distention. No thyroid enlargement, no tenderness.  LUNGS: Normal breath sounds bilaterally, no wheezing, minimal bibasilar rales, rhonchi. No use of accessory muscles of respiration.  CARDIOVASCULAR: S1, S2 normal. No murmurs, rubs, or gallops.  ABDOMEN: Soft, nontender, nondistended. Bowel  sounds present. No organomegaly or mass.  EXTREMITIES: No cyanosis, clubbing, +2 edema b/l.    NEUROLOGIC: Cranial nerves II through XII are intact. No focal Motor or sensory deficits b/l.   PSYCHIATRIC: The patient is alert and oriented x 3.  SKIN: No obvious rash, lesion, or ulcer.    LABORATORY PANEL:   CBC Recent Labs  Lab 03/26/19 0413  WBC 5.5  HGB 11.9*  HCT 37.8*  PLT 358   ------------------------------------------------------------------------------------------------------------------  Chemistries  Recent Labs  Lab 03/26/19 0413 03/27/19 0936  NA 139 139  K 3.9 4.0  CL 108 104  CO2 24 27  GLUCOSE 155* 131*  BUN 12 13  CREATININE 1.01 1.23  CALCIUM 8.2* 8.6*  AST 24  --   ALT 23  --   ALKPHOS 104  --   BILITOT 1.1  --    ------------------------------------------------------------------------------------------------------------------  Cardiac Enzymes Recent Labs  Lab 03/26/19 0413  TROPONINI 0.03*   ------------------------------------------------------------------------------------------------------------------  RADIOLOGY:  Dg Chest Port 1 View  Result Date: 03/26/2019 CLINICAL DATA:  Shortness of breath and lower extremity edema. EXAM: PORTABLE CHEST 1 VIEW COMPARISON:  Chest x-ray dated February 04, 2019. FINDINGS: Stable cardiomegaly. Normal mediastinal contours. New mild diffuse interstitial thickening. Mild bibasilar atelectasis. No focal consolidation, pleural effusion, or pneumothorax. No acute osseous abnormality. IMPRESSION: 1. Stable cardiomegaly with new mild interstitial pulmonary edema. Electronically Signed   By: Titus Dubin M.D.   On: 03/26/2019 05:15     ASSESSMENT AND PLAN:   48 year old male with past medical history of diabetes, nonischemic cardiomyopathy with EF of 20% who presented to the hospital due to shortness of breath and lower extremity edema.  1.  CHF-acute on chronic systolic dysfunction. -This is the cause of  patient's worsening shortness of breath and lower extremity edema. - Continue diuresis with IV Lasix and responding well to it.  About 5L (-) since admission. - appreciate Cardiology input.  Started on losartan, Toprol today.  2.  Essential hypertension- BP stable.  Started on Losartan, Toprol today and follow BP.   3.  Diabetes type 2 without complication-continue sliding scale insulin. -Blood sugar stable.    All the records are reviewed and case discussed with Care Management/Social Worker. Management plans discussed with the patient, family and they are in agreement.  CODE STATUS: Full code  DVT Prophylaxis: Lovenox  TOTAL TIME TAKING CARE OF THIS PATIENT: 30 minutes.   POSSIBLE D/C IN 2-3 DAYS, DEPENDING ON CLINICAL CONDITION.   Henreitta Leber M.D on 03/27/2019 at 11:45 AM  Between 7am to 6pm - Pager - [phone]  After 6pm go to [email or web address] - Proofreader  [facility]  Office  [phone]  CC: Primary care physician; Patient, No Pcp Per

## 2019-03-27 NOTE — TOC Initial Note (Signed)
Transition of Care [facility]) - Initial/Assessment Note    Patient Details  Name: Gary Bentley MRN: [id number] Date of Birth: 1970-12-16  Transition of Care [facility]) CM/SW Contact:    Elza Rafter, RN Phone Number: 03/27/2019, 9:34 AM  Clinical Narrative:       Patient is independent from home alone.  He has been admitted with acute on chronic CHF; SOB, swelling; BNP 1808.  RNCM consult placed for medication needs.  Assessment completed at bedside.  Patient obtains medications at [facility] on [address] without difficulty; he states he is able to afford all medications with his [facility].  PCP -[name]; he saw him last week.  Not current with heart failure clinic; virtual appointment is scheduled for May 12.  He does not have a scale at home but states he is able to obtain one easily at [employer].  He did not understand when asked the importance of daily weights.  No transportation issues identified.  Will continue to be available as patient progressess.  Geoffry Paradise, RN  [phone]              Expected Discharge Plan: Home/Self Care Barriers to Discharge: Continued Medical Work up   Patient Goals and CMS Choice        Expected Discharge Plan and Services Expected Discharge Plan: Home/Self Care   Discharge Planning Services: HF Clinic, CM Consult   Living arrangements for the past 2 months: Single Family Home Expected Discharge Date: 03/28/19                                    Prior Living Arrangements/Services Living arrangements for the past 2 months: Single Family Home Lives with:: Self Patient language and need for interpreter reviewed:: Yes Do you feel safe going back to the place where you live?: Yes            Criminal Activity/Legal Involvement Pertinent to Current Situation/Hospitalization: No - Comment as needed  Activities of Daily Living Home Assistive Devices/Equipment: None ADL Screening (condition at time of  admission) Patient's cognitive ability adequate to safely complete daily activities?: Yes Is the patient deaf or have difficulty hearing?: No Does the patient have difficulty seeing, even when wearing glasses/contacts?: No Does the patient have difficulty concentrating, remembering, or making decisions?: No Patient able to express need for assistance with ADLs?: Yes Does the patient have difficulty dressing or bathing?: No Independently performs ADLs?: Yes (appropriate for developmental age) Does the patient have difficulty walking or climbing stairs?: No Weakness of Legs: Both Weakness of Arms/Hands: None  Permission Sought/Granted                  Emotional Assessment Appearance:: Appears stated age Attitude/Demeanor/Rapport: Gracious Affect (typically observed): Accepting Orientation: : Oriented to Self, Oriented to Place, Oriented to  Time, Oriented to Situation Alcohol / Substance Use: Not Applicable    Admission diagnosis:  Shortness of breath [R06.02] Peripheral edema [R60.9] Elevated troponin [R79.89] Acute on chronic congestive heart failure, unspecified heart failure type [facility]) [I50.9] Patient Active Problem List   Diagnosis Date Noted  . Acute on chronic systolic CHF (congestive heart failure) ([place]) 03/26/2019  . Elevated troponin   . Peripheral edema   . Shortness of breath   . Influenza B 01/09/2019  . Cellulitis 03/22/2018  . Osteomyelitis ([place]) 09/04/2017  . Diabetes mellitus due to underlying condition with foot ulcer (CODE) ([place]) 09/04/2017  .  Hyperglycemia 09/04/2017  . Foot pain, bilateral 09/04/2017   PCP:  Patient, No Pcp Per Pharmacy:   [facility] [address] [place] [address] Phone: [phone] Fax: [phone]     Social Determinants of Health (SDOH) Interventions    Readmission Risk Interventions Readmission Risk Prevention Plan 03/27/2019  Transportation Screening Complete   Home Care Screening Complete  Medication Review (RN CM) Complete

## 2019-03-27 NOTE — Plan of Care (Signed)
Nutrition Education Note  RD consulted for nutrition education regarding CHF.  48 y/o male admitted with CHF  RD spoke with pt today via telephone   RD provided "Low Sodium Nutrition Therapy" handout from the Academy of [employer] via mail. Reviewed patient's dietary recall. Provided examples on ways to decrease sodium intake in diet. Discouraged intake of processed foods and use of salt shaker. Encouraged fresh fruits and vegetables as well as whole grain sources of carbohydrates to maximize fiber intake.   RD discussed why it is important for patient to adhere to diet recommendations, and emphasized the role of fluids, foods to avoid, and importance of weighing self daily. Teach back method used.  Expect fair compliance.  Body mass index is 29.34 kg/m. Pt meets criteria for overweight based on current BMI.  Current diet order is 2 gram sodium diet, patient is consuming approximately 100% of meals at this time. Labs and medications reviewed.   RD will add Ensure per pt's request  No further nutrition interventions warranted at this time. RD contact information provided. If additional nutrition issues arise, please re-consult RD.   Koleen Distance MS, RD, LDN Pager #- [phone] Office#- [phone] After Hours Pager: [phone]

## 2019-03-27 NOTE — Progress Notes (Signed)
CHF education. Gary Bentley says he watched all the EMMI CHF videos during this last admission.  He watched the low salt diet video again.

## 2019-03-27 NOTE — Progress Notes (Addendum)
Progress Note  Patient Name: Gary Bentley Date of Encounter: 03/27/2019  Primary Cardiologist: [name], MD   Subjective   No chest pain, palpitations, or racing HR.  He reported improved LEE; however, he still feels he is holding on to fluid. He reported significant improvement of SOB/DOE with diuresis as well. He has not yet ambulated today but stated the plan was for someone to come by to walk him soon.  Inpatient Medications    Scheduled Meds: . docusate sodium  100 mg Oral BID  . enoxaparin (LOVENOX) injection  40 mg Subcutaneous Q24H  . furosemide  40 mg Intravenous BID  . insulin aspart  0-5 Units Subcutaneous QHS  . insulin aspart  0-9 Units Subcutaneous TID WC  . losartan  25 mg Oral Daily  . metoprolol succinate  25 mg Oral Daily  . potassium chloride  40 mEq Oral Daily  . sodium chloride flush  3 mL Intravenous Q12H  . sodium chloride flush  3 mL Intravenous Q12H   Continuous Infusions:  PRN Meds: acetaminophen **OR** acetaminophen, ondansetron **OR** ondansetron (ZOFRAN) IV, sodium chloride flush   Vital Signs    Vitals:   03/26/19 1922 03/27/19 0334 03/27/19 0717 03/27/19 0943  BP: (!) 125/96 (!) 120/93 (!) 116/91   Pulse: 87 86 89   Resp: 20 20  20   Temp: 97.8 F (36.6 C) 98.4 F (36.9 C) 98.4 F (36.9 C)   TempSrc: Oral Oral Oral   SpO2: 100% 100% 93%   Weight:  112.2 kg    Height:        Intake/Output Summary (Last 24 hours) at 03/27/2019 1026 Last data filed at 03/27/2019 0916 Gross per 24 hour  Intake 235 ml  Output 3875 ml  Net -3640 ml   Filed Weights   03/26/19 0410 03/27/19 0334  Weight: 116.6 kg 112.2 kg    Telemetry    SR - Personally Reviewed  ECG    No new tracings - Personally Reviewed  Physical Exam   GEN: No acute distress.  Sitting on edge of bed Neck: JVP 8cm Cardiac: RRR, 1/6 systolic murmur; no rubs or gallops.  Respiratory: Reduced bibasilar breath sounds, trace wheezing GI: Soft, nontender,  non-distended  MS: Mild to 1+ bilateral LEE; No deformity. Neuro:  Nonfocal  Psych: Normal affect   Labs    Chemistry Recent Labs  Lab 03/26/19 0413 03/27/19 0936  NA 139 139  K 3.9 4.0  CL 108 104  CO2 24 27  GLUCOSE 155* 131*  BUN 12 13  CREATININE 1.01 1.23  CALCIUM 8.2* 8.6*  PROT 6.7  --   ALBUMIN 3.2*  --   AST 24  --   ALT 23  --   ALKPHOS 104  --   BILITOT 1.1  --   GFRNONAA >60 >60  GFRAA >60 >60  ANIONGAP 7 8     Hematology Recent Labs  Lab 03/26/19 0413  WBC 5.5  RBC 3.74*  HGB 11.9*  HCT 37.8*  MCV 101.1*  MCH 31.8  MCHC 31.5  RDW 15.2  PLT 358    Cardiac Enzymes Recent Labs  Lab 03/26/19 0413  TROPONINI 0.03*   No results for input(s): TROPIPOC in the last 168 hours.   BNP Recent Labs  Lab 03/26/19 0413  BNP 1,808.0*     DDimer No results for input(s): DDIMER in the last 168 hours.   Radiology    Dg Chest Port 1 View  Result Date: 03/26/2019 CLINICAL  DATA:  Shortness of breath and lower extremity edema. EXAM: PORTABLE CHEST 1 VIEW COMPARISON:  Chest x-ray dated February 04, 2019. FINDINGS: Stable cardiomegaly. Normal mediastinal contours. New mild diffuse interstitial thickening. Mild bibasilar atelectasis. No focal consolidation, pleural effusion, or pneumothorax. No acute osseous abnormality. IMPRESSION: 1. Stable cardiomegaly with new mild interstitial pulmonary edema. Electronically Signed   By: Titus Dubin M.D.   On: 03/26/2019 05:15    Cardiac Studies   TTE 01/10/2019  1. The left ventricle has a visually estimated ejection fraction of of 15-20%%. The cavity size was mildly dilated. There is moderately increased left ventricular wall thickness. Left ventricular diastology could not be evaluated Left ventricular  diffuse hypokinesis.  2. Left ventricular systolic function is severely reduced.  3. The right ventricle has severely reduced systolic function. The cavity was moderately enlarged. There is no increase in right  ventricular wall thickness.  4. Left atrial size was severely dilated.  5. Right atrial size was moderately dilated.  6. The mitral valve is normal in structure.  7. The tricuspid valve is normal in structure.  8. The aortic valve is tricuspid Aortic valve regurgitation is trivial by color flow Doppler.  9. Mild pulmonic stenosis. 10. The aortic root is normal in size and structure. 11. The interatrial septum was not well visualized.   Cardiac Catheterization 2.21.2020 Coronary angiography:  Coronary dominance: Right Left mainstem:Large vessel that bifurcates into the LAD and left circumflex, no significant disease noted Left anterior descending (LAD):Large vessel that extends to the apical region, diagonal branch 2 of moderate size, no significant disease noted Left circumflex (LCx):Large vessel with OM branch 2, no significant disease noted Right coronary artery (RCA):Right dominant vessel with PL and PDA, no significant disease noted Left ventriculography: Left ventricular systolic function is normal, LVEF is estimated at 20%, there is no significant mitral regurgitation , no significant aortic valve stenosis RA: 7 mean RV 443/10/17 PA: 40/21/29 PCWP: 19 C/O: 5.04 C.I: 2.07 Final Conclusions:  Nonischemic cardiomyopathy, no significant CAD Mildly elevated wedge pressure,  EF severely depressed, global Hypokinesis --continue metoprolol, lisinopril, aldactone   Patient Profile     48 y.o. 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  Assessment & Plan    Acute on chronic systolic and diastolic heart failure -Improved SOB/DOE. Not yet ambulated. LEE improved but still reports not yet back at baseline. Echo 12/2018 EF 15 to 20%.  Left ventricular diffuse hypokinesis, severe LAE, moderate RAE, mild pulmonic  stenosis.  12/2018 R/LHC showing mild pulmonary HTN. Cath 2/21 with nl cors, mildly elevated filling pressures PCWP 19. BNP elevated at 1808.0. - Weight 112.2kg, baseline weight 110.6kg, output net negative almost 5L for the admission and -3.5L yesterday. Continue strict I/O, daily weights. Daily BMET to monitor renal function and electrolytes. - Continue IV diuresis with close monitoring of renal function. Consider transition to oral diuresis tomorrow pending AM BMET and volume assessment. Close to baseline weight today but with continued mild LEE on exam. Slight bump in renal function overnight and will continue to monitor with ongoing diuresis. - Continue BB, ARB. Will need oral diuretic at discharge. Transitioned from ACE to ARB this admission. BP soft and precludes addition of spironolactone / Entresto at this time but should be considered as an outpatient or as BP and renal function allows.  - Ambulate with monitoring  of vitals - Will need follow-up echo in 3 months with optimized medical therapy to determine ICD candidacy. Medication compliance stressed. Aggressive risk factor modification recommended including diet, BP control, and strict monitoring of glucose. Will need close outpatient follow-up. Previously has followed with CHF clinic.  Demand ischemia - No chest pain.  - Troponin elevation suspicious for supply demand mismatch given minimal troponin elevation in the setting of acute on chronic heart failure.  - No plans for further ischemic evaluation this admission. See above for previous LHC with normal cors. Previously on statin therapy and ASA and discontinued with CT showing fatty infiltration within the liver. Stable AST/ALT as of most recent admission. Could consider rechecking lipid function as an outpatient to ensure ongoing risk factor modification.  DM2 - SSI, per IM. Nutrition consult recommended.  Anemia - Continue to monitor. Hgb 11.9 and RBC 3.74 . Daily CBC.   Hypoalbuminemia - Continue to monitor. Albumin 3.2.  Poor medication compliance - Stressed medication compliance   For questions or updates, please contact [place] Please consult [email or web address] for contact info under        Signed, Arvil Chaco, PA-C  03/27/2019, 10:26 AM

## 2019-03-27 NOTE — Progress Notes (Signed)
Cardiovascular and Pulmonary Nurse Navigator Note:      48 y.o. male with past medical history of diabetes, nonischemic cardiomyopathy with EF of 20% who presented to the hospital due to SOB and lower extremity edema.  Patient admitted with dx of CHF - acute on chronic systolic dysfunction.    CHF Education:? Patient sitting up on side of bed watching television.  ? Educational session with patient completed.  Provided patient with "Living Better with Heart Failure" packet. Briefly reviewed definition of heart failure and signs and symptoms of an exacerbation.?Explained to patient that HF is a chronic illness which requires self-assessment / self-management along with help from the cardiologist/PCP.   ? *Reviewed importance of and reason behind checking weight daily in the AM, after using the bathroom, but before getting dressed. Patient reported that he has not been weighing himself daily nor does he have functioning scale.  Patient will purchase scale.   ? *Reviewed with patient the following information: *Discussed when to call the Dr= weight gain of >2-3lb overnight of 5lb in a week,  *Discussed yellow zone= call MD: weight gain of >2-3lb overnight of 5lb in a week, increased swelling, increased SOB when lying down, chest discomfort, dizziness, increased fatigue *Red Zone= call 911: struggle to breath, fainting or near fainting, significant chest pain   *Diet - Dietitian Consultation entered today for education / education on low sodium carb modified diet.  Reviewed low sodium diet-provided handout of recommended and not recommended foods.?Patient is currently ordered a renal diet.   ? *Discussed fluid intake with patient as well. Patient on renal diet with 1200 ml fluid restriction.  Reviewed this with patient and demonstrated this amount using the bedside water pitcher.   ? *Instructed patient to take medications as prescribed for heart failure. Explained briefly why pt is on the  medications (either make you feel better, live longer or keep you out of the hospital) and discussed monitoring and side effects.  CHF patient education pharmacy monitoring order placed.   ? *Discussed exercise. 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  Mentioned to that he is a candidate for Cardiac Rehab with a diagnosis of CHF with EF of 20%.  Mentioned to patient he was referred to Cardiac Rehab back in February of this year.  Patient seemed to be totally unaware of this. It was noted in [facility] that a letter and brochure from Cardiac Rehab department had been mailed to him.    Overview of the program provided.    Patient immediately stated he did not know if he would have the time.    ? *Smoking Cessation- Patient is a former smoker.? ? *[facility] Heart Failure [facility] - Explained the purpose of the HF Clinic. ?Explained to patient the HF Clinic does not replace PCP nor Cardiologist, but is an additional resource to helping patient manage heart failure at home. Appt - Apr 04, 2019 at 10:20 a.m.  (Note:  Patient missed new patient HF Clinic appt in February and again in March.)   ? Again, the 5 Steps to Living Better with Heart Failure were reviewed with patient.   Will follow-up on patient again tomorrow and discuss Cardiac Rehab further with patient.   ? Patient thanked me for providing the above information. ? ? Roanna Epley, RN, BSN, [facility]? [place]  [facility]iac &?Pulmonary Rehab  Cardiovascular &?Pulmonary Nurse Navigator  Direct Line:  [phone]  Department Phone #: [phone] Fax: [phone]? Email Address: [email or web address]

## 2019-03-28 LAB — BASIC METABOLIC PANEL
Anion gap: 7 (ref 5–15)
BUN: 17 mg/dL (ref 6–20)
CO2: 27 mmol/L (ref 22–32)
Calcium: 8.8 mg/dL — ABNORMAL LOW (ref 8.9–10.3)
Chloride: 107 mmol/L (ref 98–111)
Creatinine, Ser: 1.2 mg/dL (ref 0.61–1.24)
GFR calc Af Amer: >60 mL/min (ref >60)
GFR calc non Af Amer: >60 mL/min (ref >60)
Glucose, Bld: 125 mg/dL — ABNORMAL HIGH (ref 70–99)
Potassium: 4.1 mmol/L (ref 3.5–5.1)
Sodium: 141 mmol/L (ref 135–145)

## 2019-03-28 LAB — GLUCOSE, CAPILLARY
Glucose-Capillary: 104 mg/dL — ABNORMAL HIGH (ref 70–99)
Glucose-Capillary: 165 mg/dL — ABNORMAL HIGH (ref 70–99)
Glucose-Capillary: 161 mg/dL — ABNORMAL HIGH (ref 70–99)
Glucose-Capillary: 110 mg/dL — ABNORMAL HIGH (ref 70–99)

## 2019-03-28 NOTE — Progress Notes (Addendum)
Progress Note  Patient Name: Tiron Suski Date of Encounter: 03/28/2019  Primary Cardiologist: [name], MD   Subjective   Continues to deny chest pain, palpitations, or racing HR.  He still feels as if he is holding on to some fluid in his lower extremities that is TTP; however, he does report significant improvement since admission.   He reported successful ambulation yesterday without significant DOE. He continues to report improved SOB/DOE. He is able to lay flat while sleeping without distress.  Inpatient Medications    Scheduled Meds: . docusate sodium  100 mg Oral BID  . enoxaparin (LOVENOX) injection  40 mg Subcutaneous Q24H  . feeding supplement (ENSURE ENLIVE)  237 mL Oral BID BM  . furosemide  40 mg Intravenous BID  . insulin aspart  0-5 Units Subcutaneous QHS  . insulin aspart  0-9 Units Subcutaneous TID WC  . losartan  25 mg Oral Daily  . metoprolol succinate  25 mg Oral Daily  . potassium chloride  40 mEq Oral Daily  . sodium chloride flush  3 mL Intravenous Q12H  . sodium chloride flush  3 mL Intravenous Q12H   Continuous Infusions:  PRN Meds: acetaminophen **OR** acetaminophen, ondansetron **OR** ondansetron (ZOFRAN) IV, sodium chloride flush   Vital Signs    Vitals:   03/27/19 1652 03/27/19 1923 03/28/19 0301 03/28/19 0400  BP: (!) 129/93 115/86 128/90   Pulse: 94 90 94   Resp:  20 (!) 24 18  Temp: 99.4 F (37.4 C) 98.5 F (36.9 C) 97.7 F (36.5 C)   TempSrc: Oral Oral Oral   SpO2: 100% 98% 100%   Weight:   109 kg   Height:        Intake/Output Summary (Last 24 hours) at 03/28/2019 0859 Last data filed at 03/27/2019 2356 Gross per 24 hour  Intake 555 ml  Output 1450 ml  Net -895 ml   Filed Weights   03/26/19 0410 03/27/19 0334 03/28/19 0301  Weight: 116.6 kg 112.2 kg 109 kg    Telemetry    SR; 5 beat NSVT ~4AM 03/28/2019 - Personally Reviewed  ECG    No new tracings - Personally Reviewed  Physical Exam   GEN: No acute  distress.  Laying flat in bed Neck: +JVD, JVP ~8-9cm Cardiac: RRR, 1/6 systolic murmur; no rubs or gallops.  Respiratory: Reduced bibasilar breath sounds otherwise clear to ausculation bilaterally GI: Soft, nontender, non-distended  MS: Mild to 1+ bilateral LEE, TTP; No deformity. Neuro:  Nonfocal  Psych: Normal affect   Labs    Chemistry Recent Labs  Lab 03/26/19 0413 03/27/19 0936 03/28/19 0406  NA 139 139 141  K 3.9 4.0 4.1  CL 108 104 107  CO2 24 27 27   GLUCOSE 155* 131* 125*  BUN 12 13 17   CREATININE 1.01 1.23 1.20  CALCIUM 8.2* 8.6* 8.8*  PROT 6.7  --   --   ALBUMIN 3.2*  --   --   AST 24  --   --   ALT 23  --   --   ALKPHOS 104  --   --   BILITOT 1.1  --   --   GFRNONAA >60 >60 >60  GFRAA >60 >60 >60  ANIONGAP 7 8 7      Hematology Recent Labs  Lab 03/26/19 0413  WBC 5.5  RBC 3.74*  HGB 11.9*  HCT 37.8*  MCV 101.1*  MCH 31.8  MCHC 31.5  RDW 15.2  PLT 358  Cardiac Enzymes Recent Labs  Lab 03/26/19 0413  TROPONINI 0.03*   No results for input(s): TROPIPOC in the last 168 hours.   BNP Recent Labs  Lab 03/26/19 0413  BNP 1,808.0*     DDimer No results for input(s): DDIMER in the last 168 hours.   Radiology    No results found.  Cardiac Studies   TTE 01/10/2019  1. The left ventricle has a visually estimated ejection fraction of of 15-20%%. The cavity size was mildly dilated. There is moderately increased left ventricular wall thickness. Left ventricular diastology could not be evaluated Left ventricular  diffuse hypokinesis.  2. Left ventricular systolic function is severely reduced.  3. The right ventricle has severely reduced systolic function. The cavity was moderately enlarged. There is no increase in right ventricular wall thickness.  4. Left atrial size was severely dilated.  5. Right atrial size was moderately dilated.  6. The mitral valve is normal in structure.  7. The tricuspid valve is normal in structure.  8. The aortic  valve is tricuspid Aortic valve regurgitation is trivial by color flow Doppler.  9. Mild pulmonic stenosis. 10. The aortic root is normal in size and structure. 11. The interatrial septum was not well visualized.   Cardiac Catheterization 2.21.2020 Coronary angiography:  Coronary dominance: Right Left mainstem:Large vessel that bifurcates into the LAD and left circumflex, no significant disease noted Left anterior descending (LAD):Large vessel that extends to the apical region, diagonal branch 2 of moderate size, no significant disease noted Left circumflex (LCx):Large vessel with OM branch 2, no significant disease noted Right coronary artery (RCA):Right dominant vessel with PL and PDA, no significant disease noted Left ventriculography: Left ventricular systolic function is normal, LVEF is estimated at 20%, there is no significant mitral regurgitation , no significant aortic valve stenosis RA: 7 mean RV 443/10/17 PA: 40/21/29 PCWP: 19 C/O: 5.04 C.I: 2.07 Final Conclusions:  Nonischemic cardiomyopathy, no significant CAD Mildly elevated wedge pressure,  EF severely depressed, global Hypokinesis --continue metoprolol, lisinopril, aldactone   Patient Profile     48 y.o. 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  Assessment & Plan    Acute on chronic systolic and diastolic heart failure -Improved SOB/DOE. Still mildly volume overloaded with mild LEE. - Echo 12/2018 EF 15 to 20%.  Left ventricular diffuse hypokinesis, severe LAE, moderate RAE, mild pulmonic stenosis with 12/2018 R/LHC showing mild pulmonary HTN. - Reports baseline wt ~240lbs. Currently 240.4lbs. Output net -5.9L for the admission and -1.2L yesterday. Continue strict I/O, daily weights.  - Daily BMET to monitor renal function and electrolytes.  Renal function and electrolytes stable with Cr 1.23  1.20, BUN 13  17, K 4.0  4.1. - Continue IV diuresis with 40mg  IV lasix BID for at least another day with close monitoring of renal function. Will plan to transition to oral diuretic at discharge.  - Continue Toprol 25mg  daily & Losartan 25mg  daily.  - BP taken while in the room today and soft with systolic ~814 (not yet recorded). Given continued soft pressures, will hold off on adding spironolactone. Transitioned from ACE to ARB this admission and can consider transition to [facility] as an outpatient and once medication compliance has been demonstrated. Will need to ensure patient with obtain follow-up labs if start spironolactone or transition to [facility] as an outpatient.  - Will  need follow-up echo in ~3 months with optimized medical therapy. Medication compliance stressed. Aggressive risk factor modification recommended including diet, BP control. Will need close outpatient follow-up. Previously has followed with CHF clinic.  NSVT - Asymptomatic. Review of CV strip tab shows previous episodes of short run NSVT. If c/o palpitations or racing HR in the future, could consider outpatient monitor.  - Continue Toprol 25mg  daily  Demand ischemia - No chest pain.  - Troponin elevation suspicious for supply demand mismatch given minimal troponin elevation in the setting of acute on chronic heart failure.  - No plans for further ischemic evaluation this admission. See above for previous LHC with normal cors.   DM2 - SSI, per IM.   Anemia - Recommend daily CBC   Hypoalbuminemia - Recommend continue to monitor.   Poor medication compliance - Stressed medication compliance   For questions or updates, please contact [place] Please consult [email or web address] for contact info under        Signed, Arvil Chaco, PA-C  03/28/2019, 8:59 AM

## 2019-03-28 NOTE — Progress Notes (Signed)
Cardiovascular and Pulmonary Nurse Navigator Note:    See note from 03/27/2019 for initial education session with patient.    Today this nurse talked with patient about Cardiac Rehab.  Informed patient Dr. Arta Bruce had referred him to outpatient Cardiac Rehab during his hospitalization in February 2020.  Dx on referral was Chronic Systolic HF.  EF from echo performed on 01/10/2019 was 15-20%.  Informed patient the referral to Cardiac Rehab is good for one year.  Overview of the program provided.  Patient given information on CPT billing codes, class times, and orientation times.  Explained to patient the Cardiac Rehab dept is temporarily closed due to the COVID-19 pandemic.  Patient looked at the class times.  Patient informed this RN that he works 9 a.m. to 8 p.m. Monday through Friday and Saturday 9 a.m. to 6 p.m.  Note:  Patient works as a Barrister's clerk at [employer].  Patient also stated:  "Our hours have been cut, but I think we are getting ready to go back to our regular hours."  Patient is agreeable to being contacted by the Cardiac Rehab dept when the dept re-opens.    Again, this RN asked patient what the first thing he needs to get when he is discharged besides picking up his medications.  There was a pause, then then this RN said, "Purchase a scale so you can track your weight every day."  Patient stated that he would purchased a scale.    Note:  Pharmacist on unit completed CHF education as well this a.m.   Assigned RN informed this RN that patient had refused to watch CHF videos, as he watched them during previous admission.    Patient appreciative of all the information.  Roanna Epley, RN, BSN, [facility]  Cardiovascular & Pulmonary Nurse Navigator  Direct Line: [phone]  Department Phone #: [phone] Fax: [phone]  Email Address: [email or web address]

## 2019-03-28 NOTE — Progress Notes (Signed)
[place] [facility] [place] NAME: Linkoln Alkire    MR#:  [id number]  DATE OF BIRTH:  1971/03/24  SUBJECTIVE:   No acute events overnight, shortness of breath and lower extreme edema improved since admission.  Patient overall feels better.  REVIEW OF SYSTEMS:    Review of Systems  Constitutional: Negative for chills and fever.  HENT: Negative for congestion and tinnitus.   Eyes: Negative for blurred vision and double vision.  Respiratory: Positive for shortness of breath. Negative for cough and wheezing.   Cardiovascular: Positive for leg swelling. Negative for chest pain, orthopnea and PND.  Gastrointestinal: Negative for abdominal pain, diarrhea, nausea and vomiting.  Genitourinary: Negative for dysuria and hematuria.  Neurological: Negative for dizziness, sensory change and focal weakness.  All other systems reviewed and are negative.   Nutrition: Renal, Heart healthy Tolerating Diet: Yes Tolerating PT: Ambulatory   DRUG ALLERGIES:  No Known Allergies  VITALS:  Blood pressure 121/87, pulse 87, temperature 98.4 F (36.9 C), resp. rate 16, height 6\' 5"  (1.956 m), weight 109 kg, SpO2 100 %.  PHYSICAL EXAMINATION:   Physical Exam  GENERAL:  48 y.o.-year-old patient lying in bed in no acute distress.  EYES: Pupils equal, round, reactive to light and accommodation. No scleral icterus. Extraocular muscles intact.  HEENT: Head atraumatic, normocephalic. Oropharynx and nasopharynx clear.  NECK:  Supple, + jugular venous distention. No thyroid enlargement, no tenderness.  LUNGS: Normal breath sounds bilaterally, no wheezing, minimal bibasilar rales, rhonchi. No use of accessory muscles of respiration.  CARDIOVASCULAR: S1, S2 normal. No murmurs, rubs, or gallops.  ABDOMEN: Soft, nontender, nondistended. Bowel sounds present. No organomegaly or mass.  EXTREMITIES: No cyanosis, clubbing, +2 edema b/l.    NEUROLOGIC: Cranial nerves II  through XII are intact. No focal Motor or sensory deficits b/l.   PSYCHIATRIC: The patient is alert and oriented x 3.  SKIN: No obvious rash, lesion, or ulcer.    LABORATORY PANEL:   CBC Recent Labs  Lab 03/26/19 0413  WBC 5.5  HGB 11.9*  HCT 37.8*  PLT 358   ------------------------------------------------------------------------------------------------------------------  Chemistries  Recent Labs  Lab 03/26/19 0413  03/28/19 0406  NA 139   < > 141  K 3.9   < > 4.1  CL 108   < > 107  CO2 24   < > 27  GLUCOSE 155*   < > 125*  BUN 12   < > 17  CREATININE 1.01   < > 1.20  CALCIUM 8.2*   < > 8.8*  AST 24  --   --   ALT 23  --   --   ALKPHOS 104  --   --   BILITOT 1.1  --   --    < > = values in this interval not displayed.   ------------------------------------------------------------------------------------------------------------------  Cardiac Enzymes Recent Labs  Lab 03/26/19 0413  TROPONINI 0.03*   ------------------------------------------------------------------------------------------------------------------  RADIOLOGY:  No results found.   ASSESSMENT AND PLAN:   48 year old male with past medical history of diabetes, nonischemic cardiomyopathy with EF of 20% who presented to the hospital due to shortness of breath and lower extremity edema.  1.  CHF-acute on chronic systolic dysfunction.  -This is the cause of patient's worsening shortness of breath and lower extremity edema. - Continue diuresis with IV Lasix and responding well to it.  About 7 L (-) since admission. - appreciate Cardiology input and need one more day of  IV diuresis.  - cont. Losartan, Toprol   2.  Essential hypertension- BP stable.  Started on Losartan, Toprol.   3.  Diabetes type 2 without complication-continue sliding scale insulin. -Blood sugar stable.    All the records are reviewed and case discussed with Care Management/Social Worker. Management plans discussed with the  patient, family and they are in agreement.  CODE STATUS: Full code  DVT Prophylaxis: Lovenox  TOTAL TIME TAKING CARE OF THIS PATIENT: 30 minutes.   POSSIBLE D/C tomorrow.     Henreitta Leber M.D on 03/28/2019 at 12:10 PM  Between 7am to 6pm - Pager - [phone]  After 6pm go to [email or web address] - Proofreader  [facility] [place] [facility]  Office  [phone]  CC: Primary care physician; Patient, No Pcp Per

## 2019-03-28 NOTE — Consult Note (Signed)
Provided patient with "Living Better with Heart Failure" packet. Briefly reviewed definition of heart failure and signs and symptoms of an exacerbation. Reviewed importance of and reason behind checking weight daily in the AM, after using the bathroom, but before getting dressed.  Discussed when to call the Dr= weight gain of >2lb overnight of 5lb in a week,   Discussed yellow zone= call MD: weight gain of >2lb overnight of 5lb in a week, increased swelling, increased SOB when lying down, chest discomfort, dizziness, increased fatigue  Red Zone= call 911: struggle to breath, fainting or near fainting, significant chest pain  Reviewed low sodium diet <2g/day-provided handout of recommended and not recommended foods  Fluid restriction <2L/day  Reviewed how to read nutrition label  Reviewed medication changes: Losartan, Metoprolol, Lasix, and Potassium - discussed possible addition of spironolactone  Explained briefly why pt is on the medications (either make you feel better, live longer or keep you out of the hospital) and discussed monitoring and side effects (angioedema, orthostatic hypotension)  Discussed tobacco cessation Discussed exercise  Lu Duffel, PharmD, BCPS Clinical Pharmacist 03/28/2019 9:48 AM

## 2019-03-28 NOTE — Plan of Care (Signed)
  Problem: Education: Goal: Knowledge of General Education information will improve Description Including pain rating scale, medication(s)/side effects and non-pharmacologic comfort measures Outcome: Progressing   Problem: Clinical Measurements: Goal: Ability to maintain clinical measurements within normal limits will improve Outcome: Progressing Goal: Will remain free from infection Outcome: Progressing   Problem: Activity: Goal: Risk for activity intolerance will decrease Outcome: Progressing   Problem: Coping: Goal: Level of anxiety will decrease Outcome: Progressing   Problem: Activity: Goal: Capacity to carry out activities will improve Outcome: Progressing   Problem: Cardiac: Goal: Ability to achieve and maintain adequate cardiopulmonary perfusion will improve Outcome: Progressing

## 2019-03-28 NOTE — Plan of Care (Signed)
  Problem: Clinical Measurements: Goal: Ability to maintain clinical measurements within normal limits will improve Outcome: Not Progressing Note:  Most recent hemoglobin < 12. Will continue to monitor lab values. Wenda Low [facility]

## 2019-03-29 DIAGNOSIS — I428 Other cardiomyopathies: Secondary | ICD-10-CM

## 2019-03-29 DIAGNOSIS — I509 Heart failure, unspecified: Secondary | ICD-10-CM

## 2019-03-29 LAB — GLUCOSE, CAPILLARY
Glucose-Capillary: 112 mg/dL — ABNORMAL HIGH (ref 70–99)
Glucose-Capillary: 151 mg/dL — ABNORMAL HIGH (ref 70–99)

## 2019-03-29 LAB — BASIC METABOLIC PANEL
Anion gap: 6 (ref 5–15)
BUN: 23 mg/dL — ABNORMAL HIGH (ref 6–20)
CO2: 26 mmol/L (ref 22–32)
Calcium: 8.9 mg/dL (ref 8.9–10.3)
Chloride: 108 mmol/L (ref 98–111)
Creatinine, Ser: 1.16 mg/dL (ref 0.61–1.24)
GFR calc Af Amer: >60 mL/min (ref >60)
GFR calc non Af Amer: >60 mL/min (ref >60)
Glucose, Bld: 130 mg/dL — ABNORMAL HIGH (ref 70–99)
Potassium: 4.4 mmol/L (ref 3.5–5.1)
Sodium: 140 mmol/L (ref 135–145)

## 2019-03-29 MED ORDER — POTASSIUM CHLORIDE ER 20 MEQ PO TBCR: 20.0000 meq | EXTENDED_RELEASE_TABLET | Freq: Every day | ORAL | 1 refills | Status: DC

## 2019-03-29 MED ORDER — LOSARTAN POTASSIUM 25 MG PO TABS: 12.5000 mg | ORAL_TABLET | Freq: Every day | ORAL | 1 refills | Status: DC

## 2019-03-29 MED ORDER — FUROSEMIDE 40 MG PO TABS: 40.0000 mg | ORAL_TABLET | Freq: Two times a day (BID) | ORAL | 1 refills | Status: DC

## 2019-03-29 MED ORDER — METOPROLOL SUCCINATE ER 25 MG PO TB24: 12.5000 mg | ORAL_TABLET | Freq: Every day | ORAL | 1 refills | Status: DC

## 2019-03-29 NOTE — Discharge Summary (Signed)
[place] [facility] [place] NAME: Gary Bentley    MR#:  [id number]  DATE OF BIRTH:  1971-02-26  DATE OF ADMISSION:  03/26/2019 ADMITTING PHYSICIAN: [name], MD  DATE OF DISCHARGE: 03/29/2019  PRIMARY CARE PHYSICIAN: Patient, No Pcp Per    ADMISSION DIAGNOSIS:  Shortness of breath [R06.02] Peripheral edema [R60.9] Elevated troponin [R79.89] Acute on chronic congestive heart failure, unspecified heart failure type ([place]) [I50.9]  DISCHARGE DIAGNOSIS:  Active Problems:   Acute on chronic systolic CHF (congestive heart failure) (HCC)   Elevated troponin   Peripheral edema   Shortness of breath   SECONDARY DIAGNOSIS:   Past Medical History:  Diagnosis Date  . Diabetes mellitus without complication [facility])     HOSPITAL COURSE:   48 year old male with past medical history of diabetes, nonischemic cardiomyopathy with EF of 20% who presented to the hospital due to shortness of breath and lower extremity edema.  1.  CHF-acute on chronic systolic dysfunction.  -This was secondary to patient's medical noncompliance.  Patient was admitted to the hospital and started on aggressive IV diuresis.  He responded well to it and is about 9 L negative since admission.  His shortness of breath is improved.  He still has some lower extremity edema but it has also improved since admission. -Patient was seen by cardiology who agreed with this management.  Patient was also initiated on some low-dose losartan and Toprol which he has tolerated. -Presently patient will be discharged on oral Toprol, losartan oral Lasix and potassium supplements with outpatient follow-up with PCP and his cardiologist.  2.  Essential hypertension-  will continue low-dose losartan and Toprol.  3.  Diabetes type 2 without complication- while in the hospital patient was on sliding scale insulin.  Patient will resume his insulin 70/30 upon discharge. -Further changes to his  diabetic regimen can be done by his PCP as an outpatient.  Blood sugar stable while in the hospital.  DISCHARGE CONDITIONS:   Stable.   CONSULTS OBTAINED:  Treatment Team:  Skeet Latch, MD  DRUG ALLERGIES:  No Known Allergies  DISCHARGE MEDICATIONS:   Allergies as of 03/29/2019   No Known Allergies     Medication List    STOP taking these medications   lisinopril 10 MG tablet Commonly known as:  ZESTRIL     TAKE these medications   furosemide 40 MG tablet Commonly known as:  LASIX Take 1 tablet (40 mg total) by mouth 2 (two) times daily. What changed:  when to take this   insulin aspart protamine - aspart (70-30) 100 UNIT/ML FlexPen Commonly known as:  NovoLOG Mix 70/30 FlexPen Inject 0.1 mLs (10 Units total) into the skin 2 (two) times daily.   losartan 25 MG tablet Commonly known as:  COZAAR Take 0.5 tablets (12.5 mg total) by mouth daily. Start taking on:  Mar 30, 2019   metoprolol succinate 25 MG 24 hr tablet Commonly known as:  TOPROL-XL Take 0.5 tablets (12.5 mg total) by mouth daily. Start taking on:  Mar 30, 2019   Potassium Chloride ER 20 MEQ Tbcr Take 20 mEq by mouth daily.         DISCHARGE INSTRUCTIONS:   DIET:  Cardiac diet and Diabetic diet  DISCHARGE CONDITION:  Stable  ACTIVITY:  Activity as tolerated  OXYGEN:  Home Oxygen: No.   Oxygen Delivery: room air  DISCHARGE LOCATION:  home   If you experience worsening of your admission symptoms, develop shortness  of breath, life threatening emergency, suicidal or homicidal thoughts you must seek medical attention immediately by calling 911 or calling your MD immediately  if symptoms less severe.  You Must read complete instructions/literature along with all the possible adverse reactions/side effects for all the Medicines you take and that have been prescribed to you. Take any new Medicines after you have completely understood and accpet all the possible adverse reactions/side  effects.   Please note  You were cared for by a hospitalist during your hospital stay. If you have any questions about your discharge medications or the care you received while you were in the hospital after you are discharged, you can call the unit and asked to speak with the hospitalist on call if the hospitalist that took care of you is not available. Once you are discharged, your primary care physician will handle any further medical issues. Please note that NO REFILLS for any discharge medications will be authorized once you are discharged, as it is imperative that you return to your primary care physician (or establish a relationship with a primary care physician if you do not have one) for your aftercare needs so that they can reassess your need for medications and monitor your lab values.     Today   No acute events overnight.  Shortness of breath is improved.  Denies any chest pains or any other associated symptoms.  VITAL SIGNS:  Blood pressure 98/70, pulse 83, temperature 98.2 F (36.8 C), temperature source Oral, resp. rate 18, height 6\' 5"  (1.956 m), weight 107 kg, SpO2 97 %.  I/O:    Intake/Output Summary (Last 24 hours) at 03/29/2019 1314 Last data filed at 03/29/2019 1002 Gross per 24 hour  Intake 363 ml  Output 2650 ml  Net -2287 ml    PHYSICAL EXAMINATION:   GENERAL:  48 y.o.-year-old patient lying in bed in no acute distress.  EYES: Pupils equal, round, reactive to light and accommodation. No scleral icterus. Extraocular muscles intact.  HEENT: Head atraumatic, normocephalic. Oropharynx and nasopharynx clear.  NECK:  Supple, + jugular venous distention. No thyroid enlargement, no tenderness.  LUNGS: Normal breath sounds bilaterally, no wheezing, minimal bibasilar rales, rhonchi. No use of accessory muscles of respiration.  CARDIOVASCULAR: S1, S2 normal. No murmurs, rubs, or gallops.  ABDOMEN: Soft, nontender, nondistended. Bowel sounds present. No organomegaly or  mass.  EXTREMITIES: No cyanosis, clubbing, +2 edema b/l.    NEUROLOGIC: Cranial nerves II through XII are intact. No focal Motor or sensory deficits b/l.   PSYCHIATRIC: The patient is alert and oriented x 3.  SKIN: No obvious rash, lesion, or ulcer.   DATA REVIEW:   CBC Recent Labs  Lab 03/26/19 0413  WBC 5.5  HGB 11.9*  HCT 37.8*  PLT 358    Chemistries  Recent Labs  Lab 03/26/19 0413  03/29/19 0359  NA 139   < > 140  K 3.9   < > 4.4  CL 108   < > 108  CO2 24   < > 26  GLUCOSE 155*   < > 130*  BUN 12   < > 23*  CREATININE 1.01   < > 1.16  CALCIUM 8.2*   < > 8.9  AST 24  --   --   ALT 23  --   --   ALKPHOS 104  --   --   BILITOT 1.1  --   --    < > = values in this interval not displayed.  Cardiac Enzymes Recent Labs  Lab 03/26/19 0413  TROPONINI 0.03*    Microbiology Results  Results for orders placed or performed during the hospital encounter of 03/26/19  SARS Coronavirus 2 [facility] order, Performed in [place] hospital lab)     Status: None   Collection Time: 03/26/19  4:21 AM  Result Value Ref Range Status   SARS Coronavirus 2 NEGATIVE NEGATIVE Final    Comment: (NOTE) If result is NEGATIVE SARS-CoV-2 target nucleic acids are NOT DETECTED. The SARS-CoV-2 RNA is generally detectable in upper and lower  respiratory specimens during the acute phase of infection. The lowest  concentration of SARS-CoV-2 viral copies this assay can detect is 250  copies / mL. A negative result does not preclude SARS-CoV-2 infection  and should not be used as the sole basis for treatment or other  patient management decisions.  A negative result may occur with  improper specimen collection / handling, submission of specimen other  than nasopharyngeal swab, presence of viral mutation(s) within the  areas targeted by this assay, and inadequate number of viral copies  (<250 copies / mL). A negative result must be combined with clinical  observations, patient history,  and epidemiological information. If result is POSITIVE SARS-CoV-2 target nucleic acids are DETECTED. The SARS-CoV-2 RNA is generally detectable in upper and lower  respiratory specimens dur ing the acute phase of infection.  Positive  results are indicative of active infection with SARS-CoV-2.  Clinical  correlation with patient history and other diagnostic information is  necessary to determine patient infection status.  Positive results do  not rule out bacterial infection or co-infection with other viruses. If result is PRESUMPTIVE POSTIVE SARS-CoV-2 nucleic acids MAY BE PRESENT.   A presumptive positive result was obtained on the submitted specimen  and confirmed on repeat testing.  While 2019 novel coronavirus  (SARS-CoV-2) nucleic acids may be present in the submitted sample  additional confirmatory testing may be necessary for epidemiological  and / or clinical management purposes  to differentiate between  SARS-CoV-2 and other Sarbecovirus currently known to infect humans.  If clinically indicated additional testing with an alternate test  methodology [phone]) is advised. The SARS-CoV-2 RNA is generally  detectable in upper and lower respiratory sp ecimens during the acute  phase of infection. The expected result is Negative. Fact Sheet for Patients:  StrictlyIdeas.no Fact Sheet for Healthcare Providers: BankingDealers.co.za This test is not yet approved or cleared by the [place] FDA and has been authorized for detection and/or diagnosis of SARS-CoV-2 by FDA under an Emergency Use Authorization (EUA).  This EUA will remain in effect (meaning this test can be used) for the duration of the COVID-19 declaration under Section 564(b)(1) of the Act, 21 U.S.C. section 360bbb-3(b)(1), unless the authorization is terminated or revoked sooner. Performed at [facility], [address] [place], [place] [address]      RADIOLOGY:  No results found.    Management plans discussed with the patient, family and they are in agreement.  CODE STATUS:     Code Status Orders  (From admission, onward)         Start     Ordered   03/26/19 0742  Full code  Continuous     03/26/19 0742        TOTAL TIME TAKING CARE OF THIS PATIENT: 40 minutes.    Henreitta Leber M.D on 03/29/2019 at 1:14 PM  Between 7am to 6pm - Pager - [phone]  After 6pm  go to [email or web address] - Proofreader  [facility]  Office  [phone]  CC: Primary care physician; Patient, No Pcp Per

## 2019-03-29 NOTE — Progress Notes (Signed)
[place] was admitted to the Hospital on 03/26/2019 and Discharged  03/29/2019 and should be excused from work/school   for 3 days starting 03/26/2019 , may return to work/school without any restrictions.  Call Abel Presto MD, [facility]  [phone] with questions.  Henreitta Leber M.D on 03/29/2019,at 10:26 AM

## 2019-03-29 NOTE — TOC Transition Note (Signed)
Transition of Care [facility]) - CM/SW Discharge Note   Patient Details  Name: Gary Bentley MRN: [id number] Date of Birth: 02/02/1971  [facility]) CM/SW Contact:  Elza Rafter, RN Phone Number: 03/29/2019, 10:48 AM   Clinical Narrative:   Patient is Discharging home today.  Scale provided by [facility].  No further needs identified at this time.      Final next level of care: Home/Self Care Barriers to Discharge: Continued Medical Work up   Patient Goals and CMS Choice        Discharge Placement                       Discharge Plan and Services   Discharge Planning Services: HF Clinic, CM Consult                                 Social Determinants of Health (SDOH) Interventions     Readmission Risk Interventions Readmission Risk Prevention Plan 03/27/2019  Transportation Screening Complete  Home Care Screening Complete  Medication Review (RN CM) Complete

## 2019-03-29 NOTE — Progress Notes (Signed)
Progress Note  Patient Name: Gary Bentley Date of Encounter: 03/29/2019  Primary Cardiologist: [name], MD   Subjective   Patient continues to deny CP, palpitations, racing HR.   He reported improved SOB and LEE. He feels he is back to around his baseline.   He has been walking around his room and in the hallway. He stated he is ready/ eager to go home.   Inpatient Medications    Scheduled Meds: . docusate sodium  100 mg Oral BID  . enoxaparin (LOVENOX) injection  40 mg Subcutaneous Q24H  . feeding supplement (ENSURE ENLIVE)  237 mL Oral BID BM  . furosemide  40 mg Intravenous BID  . insulin aspart  0-5 Units Subcutaneous QHS  . insulin aspart  0-9 Units Subcutaneous TID WC  . losartan  25 mg Oral Daily  . metoprolol succinate  25 mg Oral Daily  . sodium chloride flush  3 mL Intravenous Q12H  . sodium chloride flush  3 mL Intravenous Q12H   Continuous Infusions:  PRN Meds: acetaminophen **OR** acetaminophen, ondansetron **OR** ondansetron (ZOFRAN) IV, sodium chloride flush   Vital Signs    Vitals:   03/28/19 1607 03/28/19 1929 03/29/19 0419 03/29/19 0724  BP: 100/61 117/82 120/85 105/79  Pulse: 81 93 86 75  Resp: 18     Temp: 97.7 F (36.5 C)  98.3 F (36.8 C) 98.2 F (36.8 C)  TempSrc:   Oral Oral  SpO2: 100% 100% 100% 97%  Weight:   107 kg   Height:        Intake/Output Summary (Last 24 hours) at 03/29/2019 0822 Last data filed at 03/29/2019 0819 Gross per 24 hour  Intake 363 ml  Output 4050 ml  Net -3687 ml   Filed Weights   03/27/19 0334 03/28/19 0301 03/29/19 0419  Weight: 112.2 kg 109 kg 107 kg    Telemetry     70-90s, 3 beat NSVT at 8AM- Personally Reviewed  ECG    No new tracings - Personally Reviewed  Physical Exam   GEN: No acute distress.  Sitting on edge of bed Neck: JVP ~8cm Cardiac: RRR, 1/6 systolic murmur; no rubs or gallops.  Respiratory: Clear to ausculation bilaterally GI: Soft, nontender, non-distended  MS: Mild  LEE; No deformity. Neuro:  Nonfocal  Psych: Normal affect   Labs    Chemistry Recent Labs  Lab 03/26/19 0413 03/27/19 0936 03/28/19 0406 03/29/19 0359  NA 139 139 141 140  K 3.9 4.0 4.1 4.4  CL 108 104 107 108  CO2 24 27 27 26   GLUCOSE 155* 131* 125* 130*  BUN 12 13 17  23*  CREATININE 1.01 1.23 1.20 1.16  CALCIUM 8.2* 8.6* 8.8* 8.9  PROT 6.7  --   --   --   ALBUMIN 3.2*  --   --   --   AST 24  --   --   --   ALT 23  --   --   --   ALKPHOS 104  --   --   --   BILITOT 1.1  --   --   --   GFRNONAA >60 >60 >60 >60  GFRAA >60 >60 >60 >60  ANIONGAP 7 8 7 6      Hematology Recent Labs  Lab 03/26/19 0413  WBC 5.5  RBC 3.74*  HGB 11.9*  HCT 37.8*  MCV 101.1*  MCH 31.8  MCHC 31.5  RDW 15.2  PLT 358    Cardiac Enzymes Recent Labs  Lab 03/26/19 0413  TROPONINI 0.03*   No results for input(s): TROPIPOC in the last 168 hours.   BNP Recent Labs  Lab 03/26/19 0413  BNP 1,808.0*     DDimer No results for input(s): DDIMER in the last 168 hours.   Radiology    No results found.  Cardiac Studies   TTE 01/10/2019  1. The left ventricle has a visually estimated ejection fraction of of 15-20%%. The cavity size was mildly dilated. There is moderately increased left ventricular wall thickness. Left ventricular diastology could not be evaluated Left ventricular  diffuse hypokinesis.  2. Left ventricular systolic function is severely reduced.  3. The right ventricle has severely reduced systolic function. The cavity was moderately enlarged. There is no increase in right ventricular wall thickness.  4. Left atrial size was severely dilated.  5. Right atrial size was moderately dilated.  6. The mitral valve is normal in structure.  7. The tricuspid valve is normal in structure.  8. The aortic valve is tricuspid Aortic valve regurgitation is trivial by color flow Doppler.  9. Mild pulmonic stenosis. 10. The aortic root is normal in size and structure. 11. The  interatrial septum was not well visualized.   Cardiac Catheterization 2.21.2020 Coronary angiography:  Coronary dominance: Right Left mainstem:Large vessel that bifurcates into the LAD and left circumflex, no significant disease noted Left anterior descending (LAD):Large vessel that extends to the apical region, diagonal branch 2 of moderate size, no significant disease noted Left circumflex (LCx):Large vessel with OM branch 2, no significant disease noted Right coronary artery (RCA):Right dominant vessel with PL and PDA, no significant disease noted Left ventriculography: Left ventricular systolic function is normal, LVEF is estimated at 20%, there is no significant mitral regurgitation , no significant aortic valve stenosis RA: 7 mean RV 443/10/17 PA: 40/21/29 PCWP: 19 C/O: 5.04 C.I: 2.07 Final Conclusions:  Nonischemic cardiomyopathy, no significant CAD Mildly elevated wedge pressure,  EF severely depressed, global Hypokinesis --continue metoprolol, lisinopril, aldactone   Patient Profile     48 y.o. 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  Assessment & Plan    Acute on chronic systolic and diastolic heart failure - Improved SOB/DOE. Mild persistent LEE on exam. - Echo 12/2018 EF 15 to 20%.  Left ventricular diffuse hypokinesis, severe LAE, moderate RAE, mild pulmonic stenosis with 12/2018 R/LHC showing mild pulmonary HTN. - Reports baseline wt ~240lbs. Currently 236lbs.  - Output net -9.6L for the admission and -3.4L yesterday. - Daily BMET.Cr 1.20  1.16, BUN 17  23, K 4.1  4.4. - Consider transitioning back to oral lasix today. Patient continues to have good output on IV diuresis with stable Cr; however, BUN bump overnight and patient reporting he is back to his baseline weight / volume status. Continue  Toprol 25mg  daily & Losartan 25mg  daily.  - Consider transition to Entresto/Spironolactone as an outpatient with stable pressures / renal function and once medication compliance has been demonstrated. Will need to ensure patient with obtain follow-up labs if start spironolactone or transition to [facility] as an outpatient.  - Will need follow-up echo in ~3 months with optimized medical therapy. Medication compliance stressed. Aggressive risk factor modification recommended including diet, BP control.  - Will need close outpatient follow-up. Previously has followed with CHF clinic.  NSVT - Asymptomatic. Several runs of NSVT this admission  without symptoms. If c/o palpitations or racing HR in the future, consider outpatient monitor. Continue Toprol 25mg  daily  Demand ischemia - No chest pain. Troponin elevation suspicious for supply demand mismatch given minimal troponin elevation in the setting of acute on chronic heart failure. No plans for further ischemic evaluation this admission. See above for previous LHC with normal cors.   DM2 - SSI, per IM.   Anemia - Daily CBC     For questions or updates, please contact [facility] Please consult [email or web address] for contact info under        Signed, Arvil Chaco, PA-C  03/29/2019, 8:22 AM

## 2019-03-29 NOTE — Progress Notes (Signed)
Pt discharged home via private car at 1200. Pt was A&Ox4. VSS. AVS reviewed with pt and a copy was provided. Additional education on heart failure reviewed with pt and copies provided. Pt verbalized understanding of D/C instructions. Pt left with clothes/shoes, cell phone, charger, wallet, keys, and a scale given to him by cardiac rehab nurse. Pt wheeled downstairs but this RN.

## 2019-03-29 NOTE — Progress Notes (Signed)
Cardiovascular and Pulmonary Nurse Navigator Note:    Rounded on patient with Dr. Rockey Situ.  Patient is for discharge today.  Dr. 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  Discussed importance of following a 2 gm low sodium diet.  Dr. Rockey Situ and this RN instructed patient to continue on the 1200 mL fluid restriction.  Patient to start weighing daily beginning in the a.m.  And recording weight.  Patient given a scale by CM.  (Note:  Patient was suppose to have purchased a scale after last admission, but never did.)    Dr. Rockey Situ provided patient with his clinic phone number.  Patient took a picture of the number with his cell phone.    Roanna Epley, RN, BSN, [facility]  Cardiovascular & Pulmonary Nurse Navigator  Direct Line: [phone]  Department Phone #: [phone] Fax: [phone]  Email Address: [email or web address]

## 2019-03-29 NOTE — Plan of Care (Signed)
  Problem: Health Behavior/Discharge Planning: Goal: Ability to manage health-related needs will improve Outcome: Progressing   Problem: Clinical Measurements: Goal: Ability to maintain clinical measurements within normal limits will improve Outcome: Progressing Goal: Will remain free from infection Outcome: Progressing   Problem: Activity: Goal: Risk for activity intolerance will decrease Outcome: Progressing   Problem: Coping: Goal: Level of anxiety will decrease Outcome: Progressing   Problem: Elimination: Goal: Will not experience complications related to urinary retention Outcome: Progressing   Problem: Pain Managment: Goal: General experience of comfort will improve Outcome: Progressing

## 2019-03-30 DIAGNOSIS — R931 Abnormal findings on diagnostic imaging of heart and coronary circulation: Secondary | ICD-10-CM | POA: Diagnosis not present

## 2019-03-30 DIAGNOSIS — I5022 Chronic systolic (congestive) heart failure: Secondary | ICD-10-CM | POA: Diagnosis not present

## 2019-03-30 DIAGNOSIS — I10 Essential (primary) hypertension: Secondary | ICD-10-CM | POA: Diagnosis not present

## 2019-04-03 ENCOUNTER — Telehealth: Payer: Self-pay

## 2019-04-03 NOTE — Telephone Encounter (Signed)
TELEPHONE CALL NOTE  Gary Bentley has been deemed a candidate for a follow-up tele-health visit to limit community exposure during the Covid-19 pandemic. I spoke with the patient via phone to ensure availability of phone/video source, confirm preferred email & phone number, discuss instructions and expectations, and review consent.   I reminded Gary Bentley to be prepared with any vital sign and/or heart rhythm information that could potentially be obtained via home monitoring, at the time of his visit.  Finally, I reminded Gary Bentley to expect an e-mail containing a link for their video-based visit approximately 15 minutes before his visit, or alternatively, a phone call at the time of his visit if his visit is planned to be a phone encounter.  Did the patient verbally consent to treatment as below? YES  ,  L, CMA 04/03/2019 2:20 PM  CONSENT FOR TELE-HEALTH VISIT - PLEASE REVIEW  I hereby voluntarily request, consent and authorize [facility] and its employed or contracted physicians, physician assistants, nurse practitioners or other licensed health care professionals (the Practitioner), to provide me with telemedicine health care services (the "Services") as deemed necessary by the treating Practitioner. I acknowledge and consent to receive the Services by the Practitioner via telemedicine. I understand that the telemedicine visit will involve communicating with the Practitioner through telephonic communication technology and the disclosure of certain medical information by electronic transmission. I acknowledge that I have been given the opportunity to request an in-person assessment or other available alternative prior to the telemedicine visit and am voluntarily participating in the telemedicine visit.  I understand that I have the right to withhold or withdraw my consent to the use of telemedicine in the course of my care at any time, without affecting my  right to future care or treatment, and that the Practitioner or I may terminate the telemedicine visit at any time. I understand that I have the right to inspect all information obtained and/or recorded in the course of the telemedicine visit and may receive copies of available information for a reasonable fee.  I understand that some of the potential risks of receiving the Services via telemedicine include:  Marland Kitchen Delay or interruption in medical evaluation due to technological equipment failure or disruption; . Information transmitted may not be sufficient (e.g. poor resolution of images) to allow for appropriate medical decision making by the Practitioner; and/or  . In rare instances, security protocols could fail, causing a breach of personal health information.  Furthermore, I acknowledge that it is my responsibility to provide information about my medical history, conditions and care that is complete and accurate to the best of my ability. I acknowledge that Practitioner's advice, recommendations, and/or decision may be based on factors not within their control, such as incomplete or inaccurate data provided by me or lack of visual representation. I understand that the practice of medicine is not an exact science and that Practitioner makes no warranties or guarantees regarding treatment outcomes. I acknowledge that I will receive a copy of this consent concurrently upon execution via email to the email address I last provided but may also request a printed copy by calling the office of [facility].    I understand that my insurance may be billed for this visit.   I have read or had this consent read to me. . I understand the contents of this consent, which adequately explains the benefits and risks of the Services being provided via telemedicine.  . I have been  provided ample opportunity to ask questions regarding this consent and the Services and have had my questions answered to my  satisfaction. . I give my informed consent for the services to be provided through the use of telemedicine in my medical care  By participating in this telemedicine visit I agree to the above.

## 2019-04-03 NOTE — Telephone Encounter (Signed)
   TELEPHONE CALL NOTE  This patient has been deemed a candidate for follow-up tele-health visit to limit community exposure during the Covid-19 pandemic. I spoke with the patient via phone to discuss instructions. The patient was advised to review the section on consent for treatment as well. The patient will receive a phone call 2-3 days prior to their E-Visit at which time consent will be verbally confirmed. A Virtual Office Visit appointment type has been scheduled for 04/04/2019 with [facility].  Vonda Antigua L, [place] 04/03/2019 2:20 PM

## 2019-04-04 ENCOUNTER — Ambulatory Visit: Payer: 59 | Attending: Family | Admitting: Family

## 2019-04-04 ENCOUNTER — Other Ambulatory Visit: Payer: Self-pay

## 2019-04-04 ENCOUNTER — Telehealth (HOSPITAL_COMMUNITY): Payer: Self-pay

## 2019-04-04 ENCOUNTER — Encounter: Payer: Self-pay | Admitting: Family

## 2019-04-04 VITALS — Wt 232.0 lb

## 2019-04-04 DIAGNOSIS — E109 Type 1 diabetes mellitus without complications: Secondary | ICD-10-CM

## 2019-04-04 DIAGNOSIS — I10 Essential (primary) hypertension: Secondary | ICD-10-CM

## 2019-04-04 DIAGNOSIS — I5022 Chronic systolic (congestive) heart failure: Secondary | ICD-10-CM

## 2019-04-04 NOTE — Telephone Encounter (Signed)
Contacted Gary Bentley and set up appt to discuss the program with him. Appt is set for this Thursday May 14.  [place] [phone]

## 2019-04-04 NOTE — Progress Notes (Signed)
Virtual Visit via Telephone Note    Evaluation Performed:  Initial visit  This visit type was conducted due to national recommendations for restrictions regarding the COVID-19 Pandemic (e.g. social distancing).  This format is felt to be most appropriate for this patient at this time.  All issues noted in this document were discussed and addressed.  No physical exam was performed (except for noted visual exam findings with Video Visits).  Please refer to the patient's chart (MyChart message for video visits and phone note for telephone visits) for the patient's consent to telehealth for [facility]  Date:  04/04/2019   ID:  Gary Bentley, DOB 06-21-1971, MRN [id number]  Patient Location:  [place] [address]   Provider location:   [facility] [place] [address], [place] [address]  PCP:  Harrel Lemon, MD  Cardiologist:  Lujean Amel, MD Electrophysiologist:  None   Chief Complaint:  Swelling in legs  History of Present Illness:    Gary Bentley is a 48 y.o. male who presents via audio/video conferencing for a telehealth visit today.  Patient verified DOB and address.  The patient does not have symptoms concerning for COVID-19 infection (fever, chills, cough, or new SHORTNESS OF BREATH).   Patient reports minimal swelling in his legs and says that it's getting better. He has no associated symptoms and specifically denies dizziness, palpitations, chest pain, shortness of breath, difficulty sleeping, fatigue or weight gain. Upon review of medications with the patient he is taking both lisinopril as well as losartan.   Prior CV studies:   The following studies were reviewed today:  Echo report from 01/10/2019 reviewed and showed an EF of 15-20% along with mild pulmonic stenosis.   Right/ Left catheterization done 01/13/2019 showed an EF of 20% with significant CAD and mildly elevated wedge pressure.   Past Medical History:   Diagnosis Date  . Diabetes mellitus without complication [facility])    Past Surgical History:  Procedure Laterality Date  . FOOT SURGERY    . RIGHT/LEFT HEART CATH AND CORONARY ANGIOGRAPHY N/A 01/13/2019   Procedure: RIGHT/LEFT HEART CATH AND CORONARY ANGIOGRAPHY;  Surgeon: Minna Merritts, MD;  Location: [facility];  Service: Cardiovascular;  Laterality: N/A;    Prior to Admission medications   Medication Sig Start Date End Date Taking? Authorizing Provider  furosemide (LASIX) 20 MG tablet Take 1 tablet (20 mg total) by mouth daily. 03/29/19 05/28/19 Yes Sainani, Belia Heman, MD  insulin aspart protamine - aspart (NOVOLOG MIX 70/30 FLEXPEN) (70-30) 100 UNIT/ML FlexPen Inject 0.1 mLs (10 Units total) into the skin 2 (two) times daily. 01/15/19  Yes Dustin Flock, MD  lisinopril (ZESTRIL) 10 MG tablet Take 10 mg by mouth daily.   Yes [provider]  losartan (COZAAR) 25 MG tablet Take 0.5 tablets (12.5 mg total) by mouth daily. 03/30/19 05/29/19 Yes Sainani, Belia Heman, MD  metoprolol succinate (TOPROL-XL) 25 MG 24 hr tablet Take 0.5 tablets (12.5 mg total) by mouth daily. 03/30/19 05/29/19 Yes Sainani, Belia Heman, MD  potassium chloride 20 MEQ TBCR Take 20 mEq by mouth daily. 03/29/19 05/28/19 Yes Henreitta Leber, MD      Allergies:   Patient has no known allergies.   Social History   Tobacco Use  . Smoking status: Former Smoker    Packs/day: 1.00    Types: Cigarettes    Last attempt to quit: 04/17/2017    Years since quitting: 1.9  .  Smokeless tobacco: Never Used  Substance Use Topics  . Alcohol use: No  . Drug use: No     Family Hx: The patient's Family history is unknown by patient.  ROS:   Please see the history of present illness.     All other systems reviewed and are negative.   Labs/Other Tests and Data Reviewed:    Recent Labs: 01/09/2019: Magnesium 1.7 03/26/2019: ALT 23; B Natriuretic Peptide 1,808.0; Hemoglobin 11.9; Platelets 358; TSH 0.845 03/29/2019: BUN 23;  Creatinine, Ser 1.16; Potassium 4.4; Sodium 140   Recent Lipid Panel No results found for: CHOL, TRIG, HDL, CHOLHDL, LDLCALC, LDLDIRECT  Wt Readings from Last 3 Encounters:  04/04/19 232 lb (105.2 kg)  03/29/19 236 lb (107 kg)  02/04/19 235 lb (106.6 kg)     Exam:    Vital Signs:  Wt 232 lb (105.2 kg) Comment: self-reported  BMI 27.51 kg/m    Well nourished, well developed male in no  acute distress.   ASSESSMENT & PLAN:    1. Chronic heart failure with reduced ejection fraction- - NYHA class I - euvolemic based on patient's description of symptoms - weighing daily and says that his weight has ranged from 232-235 pounds; instructed to call for an overnight weight gain of >2 pounds or a weekly weight gain of >5 pounds - not adding salt and has been trying to look at food labels so as to eat low sodium foods. Using pepper for seasoning - instructed him to stop his lisinopril as he's on losartan; consider entresto if BP allows and once COVID-19 restrictions are lifted so that we can closely monitor BP and renal function - saw cardiology Clayborn Bigness) 03/30/2019; also had appointment scheduled with Dr. Saunders Revel and patient requests to cancel Dr. Darnelle Bos appointment as he's going to stay with Dr. Clayborn Bigness - BNP on 03/26/2019 was 1808.0 - due to multiple ED visits over the last year, discussed paramedicine program with patient and he is interested; referral sent in  2: HTN- - not checking his BP at home - saw PCP [name]) 03/21/2019 - BMP from 03/29/2019 reviewed and showed sodium 140, potassium 4.4, creatinine 1.16 and GFR >60  3: Diabetes-   - glucose at home today was 102 - A1c on 03/26/2019 was 5.2%  COVID-19 Education: The signs and symptoms of COVID-19 were discussed with the patient and how to seek care for testing (follow up with PCP or arrange E-visit).  The importance of social distancing was discussed today.  Patient Risk:   After full review of this patients clinical status, I feel  that they are at least moderate risk at this time.  Time:   Today, I have spent 16 minutes with the patient with telehealth technology discussing medications, weight, diet and symptoms to report.     Medication Adjustments/Labs and Tests Ordered: Current medicines are reviewed at length with the patient today.  Concerns regarding medicines are outlined above.   Tests Ordered: No orders of the defined types were placed in this encounter.  Medication Changes: No orders of the defined types were placed in this encounter.   Disposition:  Follow-up in 1 month or sooner for any questions/problems before then.   Signed, Alisa Graff, FNP  04/04/2019 10:47 AM    [facility]

## 2019-04-04 NOTE — Patient Instructions (Signed)
Continue weighing daily and call for an overnight weight gain of > 2 pounds or a weekly weight gain of >5 pounds. 

## 2019-04-06 ENCOUNTER — Other Ambulatory Visit (HOSPITAL_COMMUNITY): Payer: Self-pay

## 2019-04-06 NOTE — Progress Notes (Signed)
Today was a visit with Gary Bentley at his work.  He states doing good, feels good.  He denies weight gain or swelling. Verified his medications, he is aware of how to take them and what they are for.  He is also taking Lisinopril prescribed by Dr [name].  Explained the program and he is willing to be part of it.  He is fairly new to the area, he has no family and no close friends here. He states watches his foods and his fluids.  Explained weight gain of 3 lbs or more over night or 5 lbs in a week to call me or [facility].  Explained if he dont feel good he can call or if he needs anything.  Left packet with my info.  He denies chest pain, shortness of breath, headaches or dizziness.  He cook when home, but works long hours and grabs food on the go.  Will visit for heart failure and diet.   [place] [phone]

## 2019-04-07 ENCOUNTER — Telehealth: Payer: 59 | Admitting: Internal Medicine

## 2019-04-07 ENCOUNTER — Other Ambulatory Visit: Payer: Self-pay | Admitting: Family

## 2019-04-24 ENCOUNTER — Telehealth (HOSPITAL_COMMUNITY): Payer: Self-pay

## 2019-04-24 NOTE — Telephone Encounter (Signed)
Attempted to contaact, no answer.  Left message for him to call me.   [place] [phone]

## 2019-04-25 ENCOUNTER — Telehealth (HOSPITAL_COMMUNITY): Payer: Self-pay

## 2019-04-25 NOTE — Telephone Encounter (Signed)
Gary Bentley contacted me back and advised needed refills for Losartan, potassium and metoprolol.  [place] and they will get them ready for him today.  He has refills on these.  He states he will pick them up this evening.  He states no swelling and he his weight has been staying the same.  He denies chest pain, headaches or dizziness.  He states been working and doing well.  Will continue to visit for heart failure and medication compliance.   [place] [phone]

## 2019-04-25 NOTE — Telephone Encounter (Signed)
Received a phone call from [place], he states doing ok.  He states busy at work yesterday.  Went to verify meds and he states out of 2 of them, ran out today.  He states he does not know which ones without his bag.  He states will call me back and let me know which ones he is out of.    Albert Lea [phone]

## 2019-04-26 ENCOUNTER — Ambulatory Visit: Payer: 59 | Admitting: Family

## 2019-04-29 ENCOUNTER — Other Ambulatory Visit: Payer: Self-pay

## 2019-04-29 ENCOUNTER — Emergency Department: Payer: 59

## 2019-04-29 ENCOUNTER — Encounter: Payer: Self-pay | Admitting: Emergency Medicine

## 2019-04-29 ENCOUNTER — Emergency Department
Admission: EM | Admit: 2019-04-29 | Discharge: 2019-04-30 | Disposition: A | Discharge status: Home / Self Care | Payer: 59 | Attending: Emergency Medicine | Admitting: Emergency Medicine

## 2019-04-29 DIAGNOSIS — I509 Heart failure, unspecified: Secondary | ICD-10-CM | POA: Insufficient documentation

## 2019-04-29 DIAGNOSIS — E119 Type 2 diabetes mellitus without complications: Secondary | ICD-10-CM | POA: Diagnosis not present

## 2019-04-29 DIAGNOSIS — Z794 Long term (current) use of insulin: Secondary | ICD-10-CM | POA: Insufficient documentation

## 2019-04-29 DIAGNOSIS — Z87891 Personal history of nicotine dependence: Secondary | ICD-10-CM | POA: Insufficient documentation

## 2019-04-29 DIAGNOSIS — I110 Hypertensive heart disease with heart failure: Secondary | ICD-10-CM | POA: Insufficient documentation

## 2019-04-29 DIAGNOSIS — Z79899 Other long term (current) drug therapy: Secondary | ICD-10-CM | POA: Diagnosis not present

## 2019-04-29 DIAGNOSIS — R2243 Localized swelling, mass and lump, lower limb, bilateral: Secondary | ICD-10-CM | POA: Diagnosis present

## 2019-04-29 MED ORDER — ONDANSETRON HCL 4 MG/2ML IJ SOLN
INTRAMUSCULAR | Status: AC
  Administered 2019-04-29: 2 mg
  Filled 2019-04-29: qty 2

## 2019-04-29 NOTE — ED Notes (Signed)
Pt to xray at this time.

## 2019-04-29 NOTE — ED Notes (Signed)
Patient tolerated IV insertion without incident. Patient is very pleasant male who presents to ED with swelling of lower extremities in the last few days. Patient states he just "doesn't feel well." Left work and came to ED because he felt like his legs were swelling. Patient began to vomit while this RN was starting IV. States he has not had any vomiting until now. Patient denies respiratory distress, CP or abdominal pain. Denies diarrhea as well. Patient without respiratory difficulty, circumoral cyanosis and no nasal flaring is noted. MD into room as this RN was evaluating.

## 2019-04-29 NOTE — ED Triage Notes (Signed)
Pt to ED via POV c/o bilateral leg swelling x 5 days. Pt states that last night he started having some shortness of breath as well. Pt is in NAD at this time.

## 2019-04-30 LAB — CBC WITH DIFFERENTIAL/PLATELET
Abs Immature Granulocytes: 0.01 10*3/uL (ref 0.00–0.07)
Basophils Absolute: 0 10*3/uL (ref 0.0–0.1)
Basophils Relative: 1 %
Eosinophils Absolute: 0.1 10*3/uL (ref 0.0–0.5)
Eosinophils Relative: 2 %
HCT: 40.3 % (ref 39.0–52.0)
Hemoglobin: 12.9 g/dL — ABNORMAL LOW (ref 13.0–17.0)
Immature Granulocytes: 0 %
Lymphocytes Relative: 30 %
Lymphs Abs: 1.5 10*3/uL (ref 0.7–4.0)
MCH: 32.3 pg (ref 26.0–34.0)
MCHC: 32 g/dL (ref 30.0–36.0)
MCV: 100.8 fL — ABNORMAL HIGH (ref 80.0–100.0)
Monocytes Absolute: 0.4 10*3/uL (ref 0.1–1.0)
Monocytes Relative: 8 %
Neutro Abs: 2.9 10*3/uL (ref 1.7–7.7)
Neutrophils Relative %: 59 %
Platelets: 259 10*3/uL (ref 150–400)
RBC: 4 MIL/uL — ABNORMAL LOW (ref 4.22–5.81)
RDW: 14.6 % (ref 11.5–15.5)
WBC: 4.9 10*3/uL (ref 4.0–10.5)
nRBC: 0 % (ref 0.0–0.2)

## 2019-04-30 LAB — COMPREHENSIVE METABOLIC PANEL
ALT: 20 U/L (ref 0–44)
AST: 19 U/L (ref 15–41)
Albumin: 3.7 g/dL (ref 3.5–5.0)
Alkaline Phosphatase: 96 U/L (ref 38–126)
Anion gap: 9 (ref 5–15)
BUN: 13 mg/dL (ref 6–20)
CO2: 23 mmol/L (ref 22–32)
Calcium: 8.6 mg/dL — ABNORMAL LOW (ref 8.9–10.3)
Chloride: 108 mmol/L (ref 98–111)
Creatinine, Ser: 1.18 mg/dL (ref 0.61–1.24)
GFR calc Af Amer: >60 mL/min (ref >60)
GFR calc non Af Amer: >60 mL/min (ref >60)
Glucose, Bld: 67 mg/dL — ABNORMAL LOW (ref 70–99)
Potassium: 3.6 mmol/L (ref 3.5–5.1)
Sodium: 140 mmol/L (ref 135–145)
Total Bilirubin: 1.8 mg/dL — ABNORMAL HIGH (ref 0.3–1.2)
Total Protein: 7.5 g/dL (ref 6.5–8.1)

## 2019-04-30 LAB — TROPONIN I: Troponin I: <0.03 ng/mL (ref <0.03)

## 2019-04-30 LAB — BRAIN NATRIURETIC PEPTIDE: B Natriuretic Peptide: 1631 pg/mL — ABNORMAL HIGH (ref 0.0–100.0)

## 2019-04-30 LAB — GLUCOSE, CAPILLARY: Glucose-Capillary: 108 mg/dL — ABNORMAL HIGH (ref 70–99)

## 2019-04-30 MED ORDER — FUROSEMIDE 10 MG/ML IJ SOLN
60.0000 mg | Freq: Once | INTRAMUSCULAR | Status: AC
  Administered 2019-04-30: 60 mg via INTRAVENOUS
  Filled 2019-04-30: qty 8

## 2019-04-30 MED ORDER — FUROSEMIDE 20 MG PO TABS: 40.0000 mg | ORAL_TABLET | Freq: Two times a day (BID) | ORAL | 1 refills | Status: DC

## 2019-04-30 NOTE — Discharge Instructions (Addendum)
Increase your furosemide to 2 pills twice a day.  Call your cardiologist Monday morning for close follow-up.  Return to the emergency room for worsening shortness of breath or chest pain.

## 2019-04-30 NOTE — ED Provider Notes (Signed)
[facility] Emergency Department Provider Note  ____________________________________________  Time seen: Approximately 12:06 AM  I have reviewed the triage vital signs and the nursing notes.   HISTORY  Chief Complaint Leg Swelling and Shortness of Breath   HPI Gary Bentley is a 48 y.o. male with a history of CHF with a EF of 15%, diabetes, HTN who presents for evaluation of leg swelling and shortness of breath.  Patient recently admitted a month ago for CHF exacerbation.  Was discharged on Lasix 40 mg twice daily.  Unfortunately patient has been taking at 20 mg daily which was his previous dose prior to this recent admission.  Over the last week he has had progressively worsening swelling of bilateral lower extremities and today started having shortness of breath.  Shortness of breath is not present at rest but is moderate in intensity with minimal exertion or laying flat.  No cough, no fever, no chest pain, no vomiting, no diarrhea, no abdominal pain.  Patient does have some nausea today.  Past Medical History:  Diagnosis Date  . Diabetes mellitus without complication [facility])     Patient Active Problem List   Diagnosis Date Noted  . Acute on chronic systolic CHF (congestive heart failure) ([place]) 03/26/2019  . Elevated troponin   . Peripheral edema   . Shortness of breath   . Influenza B 01/09/2019  . Cellulitis 03/22/2018  . Osteomyelitis ([place]) 09/04/2017  . Diabetes mellitus due to underlying condition with foot ulcer (CODE) ([place]) 09/04/2017  . Hyperglycemia 09/04/2017  . Foot pain, bilateral 09/04/2017    Past Surgical History:  Procedure Laterality Date  . FOOT SURGERY    . RIGHT/LEFT HEART CATH AND CORONARY ANGIOGRAPHY N/A 01/13/2019   Procedure: RIGHT/LEFT HEART CATH AND CORONARY ANGIOGRAPHY;  Surgeon: Minna Merritts, MD;  Location: [facility];  Service: Cardiovascular;  Laterality: N/A;    Prior to Admission medications    Medication Sig Start Date End Date Taking? Authorizing Provider  furosemide (LASIX) 20 MG tablet Take 2 tablets (40 mg total) by mouth 2 (two) times daily. 04/30/19   Rudene Re, MD  insulin aspart protamine - aspart (NOVOLOG MIX 70/30 FLEXPEN) (70-30) 100 UNIT/ML FlexPen Inject 0.1 mLs (10 Units total) into the skin 2 (two) times daily. 01/15/19   Dustin Flock, MD  losartan (COZAAR) 25 MG tablet Take 0.5 tablets (12.5 mg total) by mouth daily. 03/30/19 05/29/19  Henreitta Leber, MD  metoprolol succinate (TOPROL-XL) 25 MG 24 hr tablet Take 0.5 tablets (12.5 mg total) by mouth daily. 03/30/19 05/29/19  Henreitta Leber, MD  potassium chloride 20 MEQ TBCR Take 20 mEq by mouth daily. 03/29/19 05/28/19  Henreitta Leber, MD    Allergies Patient has no known allergies.  Family History  Family history unknown: Yes    Social History Social History   Tobacco Use  . Smoking status: Former Smoker    Packs/day: 1.00    Types: Cigarettes    Last attempt to quit: 04/17/2017    Years since quitting: 2.0  . Smokeless tobacco: Never Used  Substance Use Topics  . Alcohol use: No  . Drug use: No    Review of Systems  Constitutional: Negative for fever. Eyes: Negative for visual changes. ENT: Negative for sore throat. Neck: No neck pain  Cardiovascular: Negative for chest pain. Respiratory: + shortness of breath. Gastrointestinal: Negative for abdominal pain, vomiting or diarrhea. + nausea Genitourinary: Negative for dysuria. Musculoskeletal: Negative for back pain. +  b/l leg swelling Skin: Negative for rash. Neurological: Negative for headaches, weakness or numbness. Psych: No SI or HI  ____________________________________________   PHYSICAL EXAM:  VITAL SIGNS: ED Triage Vitals  Enc Vitals Group     BP 04/29/19 1807 (!) 138/92     Pulse Rate 04/29/19 1807 91     Resp 04/29/19 1807 16     Temp 04/29/19 1807 99.2 F (37.3 C)     Temp Source 04/29/19 1807 Oral     SpO2 04/29/19  1807 99 %     Weight 04/29/19 1804 235 lb (106.6 kg)     Height 04/29/19 1804 6\' 5"  (1.956 m)     Head Circumference --      Peak Flow --      Pain Score 04/29/19 1804 7     Pain Loc --      Pain Edu? --      Excl. in Normal? --     Constitutional: Alert and oriented. Well appearing and in no apparent distress. HEENT:      Head: Normocephalic and atraumatic.         Eyes: Conjunctivae are normal. Sclera is non-icteric.       Mouth/Throat: Mucous membranes are moist.       Neck: Supple with no signs of meningismus. Cardiovascular: Regular rate and rhythm. No murmurs, gallops, or rubs. 2+ symmetrical distal pulses are present in all extremities.  Respiratory: Normal respiratory effort. Lungs are clear to auscultation bilaterally. No wheezes, crackles, or rhonchi.  Gastrointestinal: Soft, non tender, and non distended with positive bowel sounds. No rebound or guarding. Musculoskeletal: 2+ pitting edema Neurologic: Normal speech and language. Face is symmetric. Moving all extremities. No gross focal neurologic deficits are appreciated. Skin: Skin is warm, dry and intact. No rash noted. Psychiatric: Mood and affect are normal. Speech and behavior are normal.  ____________________________________________   LABS (all labs ordered are listed, but only abnormal results are displayed)  Labs Reviewed  CBC WITH DIFFERENTIAL/PLATELET - Abnormal; Notable for the following components:      Result Value   RBC 4.00 (*)    Hemoglobin 12.9 (*)    MCV 100.8 (*)    All other components within normal limits  COMPREHENSIVE METABOLIC PANEL - Abnormal; Notable for the following components:   Glucose, Bld 67 (*)    Calcium 8.6 (*)    Total Bilirubin 1.8 (*)    All other components within normal limits  BRAIN NATRIURETIC PEPTIDE - Abnormal; Notable for the following components:   B Natriuretic Peptide 1,631.0 (*)    All other components within normal limits  TROPONIN I  CBG MONITORING, ED    ____________________________________________  EKG  ED ECG REPORT I, Rudene Re, the attending physician, personally viewed and interpreted this ECG.  Normal sinus rhythm, rate of 92, LVH, prolonged QTC, LAFB, left axis deviation, T wave inversions in lateral leads with no ST elevation.  EKG is unchanged from prior. ____________________________________________  RADIOLOGY  I have personally reviewed the images performed during this visit and I agree with the Radiologist's read.   Interpretation by Radiologist:  Dg Chest 2 View  Result Date: 04/29/2019 CLINICAL DATA:  Shortness of breath EXAM: CHEST - 2 VIEW COMPARISON:  03/26/2019 FINDINGS: Cardiomegaly. Both lungs are clear. The visualized skeletal structures are unremarkable. IMPRESSION: Cardiomegaly without acute abnormality of the lungs. Electronically Signed   By: Eddie Candle M.D.   On: 04/29/2019 23:33     ____________________________________________   PROCEDURES  Procedure(s) performed: None Procedures Critical Care performed:  None ____________________________________________   INITIAL IMPRESSION / ASSESSMENT AND PLAN / ED COURSE   48 y.o. male with a history of CHF with a EF of 15%, diabetes, HTN who presents for evaluation of bilateral leg swelling x 1 week and shortness of breath x 1 day.  Patient is volume overloaded with 2+ pitting edema bilateral lower extremities, he has normal work of breathing, normal sats and his lungs are clear to auscultation.  Presentation is concerning for CHF exacerbation since patient has been taking his Lasix erroneously.  Over the last month since being admitted to the hospital patient was supposed to be taking 40 mg twice daily and is only taking 20 mg once daily.  Chest x-ray does not show gross pulmonary edema.  Will check labs to evaluate his electrolytes and kidney function.  Will give 60 mg of IV Lasix.  Will check ambulatory sats for disposition.    _________________________  1:44 AM on 04/30/2019 -----------------------------------------  Labs showing normal kidney function and normal electrolytes.  Glucose was low at 67.  Patient had not had anything to eat.  He was given food in the emergency room and repeat CBG showed 108.  Patient received Lasix with good urine output.  Shortness of breath improved.  Remains with normal work of breathing and normal sats.  Patient ambulating in the emergency room with no hypoxia.  Discussed with the patient importance of increasing his Lasix to 40 mg twice daily as recommended a month ago and follow-up with his cardiologist Dr. Clayborn Bigness Monday morning.  Also discussed return precautions for chest pain or worsening shortness of breath.  Patient understands these recommendations.  Refill for his Lasix has been sent to his pharmacy.   As part of my medical decision making, I reviewed the following data within the [facility] notes reviewed and incorporated, Labs reviewed , EKG interpreted , Old EKG reviewed, Old chart reviewed, Radiograph reviewed , Notes from prior ED visits and [place] Controlled Substance Database    Pertinent labs & imaging results that were available during my care of the patient were reviewed by me and considered in my medical decision making (see chart for details).    ____________________________________________   FINAL CLINICAL IMPRESSION(S) / ED DIAGNOSES  Final diagnoses:  Acute on chronic congestive heart failure, unspecified heart failure type ([place])      NEW MEDICATIONS STARTED DURING THIS VISIT:  ED Discharge Orders         Ordered    furosemide (LASIX) 20 MG tablet  2 times daily     04/30/19 0140           Note:  This document was prepared using Dragon voice recognition software and may include unintentional dictation errors.    Alfred Levins, [place], MD 04/30/19 [phone]

## 2019-05-03 ENCOUNTER — Telehealth (HOSPITAL_COMMUNITY): Payer: Self-pay

## 2019-05-03 NOTE — Telephone Encounter (Signed)
Attempted to contact, no answer left message.  ° °Rheannon Cerney ° EMT-Paramedic °[phone] °

## 2019-05-08 ENCOUNTER — Telehealth (HOSPITAL_COMMUNITY): Payer: Self-pay

## 2019-05-08 NOTE — Telephone Encounter (Signed)
Attempted to contact Cheron and states he is not accepting any phone calls at this time.  Will continue to try to contact him.   [place] [phone]

## 2019-05-10 ENCOUNTER — Ambulatory Visit: Payer: 59 | Admitting: Family

## 2019-05-10 ENCOUNTER — Telehealth: Payer: Self-pay | Admitting: Family

## 2019-05-10 NOTE — Telephone Encounter (Signed)
Patient did not show for his [facility] appointment on 05/10/2019. Will attempt to reschedule.

## 2019-05-11 ENCOUNTER — Telehealth (HOSPITAL_COMMUNITY): Payer: Self-pay

## 2019-05-11 NOTE — Telephone Encounter (Signed)
Attempted to contact him, phone states not receiving calls at this time.  Will continue to try to contact.   [place] [phone]

## 2019-05-17 ENCOUNTER — Telehealth (HOSPITAL_COMMUNITY): Payer: Self-pay

## 2019-05-17 NOTE — Telephone Encounter (Signed)
Have attempted to contact several times and attempted to speak with him at work.  Unable, his phone just advises not receiving phone calls.  Will continue to try to contact.   [place] [phone]

## 2019-05-27 ENCOUNTER — Other Ambulatory Visit: Payer: Self-pay

## 2019-05-27 ENCOUNTER — Encounter: Payer: Self-pay | Admitting: Emergency Medicine

## 2019-05-27 DIAGNOSIS — Z794 Long term (current) use of insulin: Secondary | ICD-10-CM | POA: Insufficient documentation

## 2019-05-27 DIAGNOSIS — I110 Hypertensive heart disease with heart failure: Secondary | ICD-10-CM | POA: Diagnosis not present

## 2019-05-27 DIAGNOSIS — I5023 Acute on chronic systolic (congestive) heart failure: Secondary | ICD-10-CM | POA: Diagnosis not present

## 2019-05-27 DIAGNOSIS — I428 Other cardiomyopathies: Secondary | ICD-10-CM | POA: Insufficient documentation

## 2019-05-27 DIAGNOSIS — Z79899 Other long term (current) drug therapy: Secondary | ICD-10-CM | POA: Diagnosis not present

## 2019-05-27 DIAGNOSIS — Z87891 Personal history of nicotine dependence: Secondary | ICD-10-CM | POA: Insufficient documentation

## 2019-05-27 DIAGNOSIS — E11649 Type 2 diabetes mellitus with hypoglycemia without coma: Secondary | ICD-10-CM | POA: Diagnosis not present

## 2019-05-27 DIAGNOSIS — E162 Hypoglycemia, unspecified: Secondary | ICD-10-CM | POA: Diagnosis present

## 2019-05-27 DIAGNOSIS — Z20828 Contact with and (suspected) exposure to other viral communicable diseases: Secondary | ICD-10-CM | POA: Diagnosis not present

## 2019-05-27 DIAGNOSIS — I509 Heart failure, unspecified: Secondary | ICD-10-CM | POA: Diagnosis present

## 2019-05-27 DIAGNOSIS — R0602 Shortness of breath: Secondary | ICD-10-CM | POA: Diagnosis present

## 2019-05-27 LAB — BASIC METABOLIC PANEL
Anion gap: 6 (ref 5–15)
BUN: 19 mg/dL (ref 6–20)
CO2: 24 mmol/L (ref 22–32)
Calcium: 8.5 mg/dL — ABNORMAL LOW (ref 8.9–10.3)
Chloride: 109 mmol/L (ref 98–111)
Creatinine, Ser: 1.17 mg/dL (ref 0.61–1.24)
GFR calc Af Amer: >60 mL/min (ref >60)
GFR calc non Af Amer: >60 mL/min (ref >60)
Glucose, Bld: 94 mg/dL (ref 70–99)
Potassium: 3.8 mmol/L (ref 3.5–5.1)
Sodium: 139 mmol/L (ref 135–145)

## 2019-05-27 LAB — CBC
HCT: 37.1 % — ABNORMAL LOW (ref 39.0–52.0)
Hemoglobin: 11.6 g/dL — ABNORMAL LOW (ref 13.0–17.0)
MCH: 32 pg (ref 26.0–34.0)
MCHC: 31.3 g/dL (ref 30.0–36.0)
MCV: 102.2 fL — ABNORMAL HIGH (ref 80.0–100.0)
Platelets: 248 10*3/uL (ref 150–400)
RBC: 3.63 MIL/uL — ABNORMAL LOW (ref 4.22–5.81)
RDW: 14 % (ref 11.5–15.5)
WBC: 6.7 10*3/uL (ref 4.0–10.5)
nRBC: 0 % (ref 0.0–0.2)

## 2019-05-27 LAB — TROPONIN I (HIGH SENSITIVITY): Troponin I (High Sensitivity): 20 ng/L — ABNORMAL HIGH (ref <18)

## 2019-05-27 NOTE — ED Triage Notes (Signed)
Pt arrives POV to triage with c/o SOB x 2 days. Pt reports hx of CHF. Pt has clear lung sounds throughout all fields at this time and appears in NAD.

## 2019-05-28 ENCOUNTER — Observation Stay
Admission: EM | Admit: 2019-05-28 | Discharge: 2019-05-28 | Discharge status: Against Medical Advice | Payer: 59 | Attending: Internal Medicine | Admitting: Internal Medicine

## 2019-05-28 ENCOUNTER — Emergency Department: Payer: 59

## 2019-05-28 DIAGNOSIS — R600 Localized edema: Secondary | ICD-10-CM | POA: Diagnosis present

## 2019-05-28 DIAGNOSIS — R0602 Shortness of breath: Secondary | ICD-10-CM

## 2019-05-28 DIAGNOSIS — R609 Edema, unspecified: Secondary | ICD-10-CM | POA: Diagnosis present

## 2019-05-28 DIAGNOSIS — E162 Hypoglycemia, unspecified: Secondary | ICD-10-CM | POA: Diagnosis present

## 2019-05-28 DIAGNOSIS — I509 Heart failure, unspecified: Secondary | ICD-10-CM | POA: Insufficient documentation

## 2019-05-28 HISTORY — DX: Heart failure, unspecified: I50.9

## 2019-05-28 LAB — GLUCOSE, CAPILLARY
Glucose-Capillary: 105 mg/dL — ABNORMAL HIGH (ref 70–99)
Glucose-Capillary: 107 mg/dL — ABNORMAL HIGH (ref 70–99)
Glucose-Capillary: 110 mg/dL — ABNORMAL HIGH (ref 70–99)
Glucose-Capillary: 115 mg/dL — ABNORMAL HIGH (ref 70–99)
Glucose-Capillary: 117 mg/dL — ABNORMAL HIGH (ref 70–99)
Glucose-Capillary: 121 mg/dL — ABNORMAL HIGH (ref 70–99)
Glucose-Capillary: 154 mg/dL — ABNORMAL HIGH (ref 70–99)
Glucose-Capillary: 46 mg/dL — ABNORMAL LOW (ref 70–99)
Glucose-Capillary: 63 mg/dL — ABNORMAL LOW (ref 70–99)
Glucose-Capillary: 97 mg/dL (ref 70–99)

## 2019-05-28 LAB — BASIC METABOLIC PANEL
Anion gap: 6 (ref 5–15)
BUN: 19 mg/dL (ref 6–20)
CO2: 23 mmol/L (ref 22–32)
Calcium: 8.7 mg/dL — ABNORMAL LOW (ref 8.9–10.3)
Chloride: 108 mmol/L (ref 98–111)
Creatinine, Ser: 0.93 mg/dL (ref 0.61–1.24)
GFR calc Af Amer: >60 mL/min (ref >60)
GFR calc non Af Amer: >60 mL/min (ref >60)
Glucose, Bld: 108 mg/dL — ABNORMAL HIGH (ref 70–99)
Potassium: 4 mmol/L (ref 3.5–5.1)
Sodium: 137 mmol/L (ref 135–145)

## 2019-05-28 LAB — CBC
HCT: 37.8 % — ABNORMAL LOW (ref 39.0–52.0)
Hemoglobin: 11.8 g/dL — ABNORMAL LOW (ref 13.0–17.0)
MCH: 31.8 pg (ref 26.0–34.0)
MCHC: 31.2 g/dL (ref 30.0–36.0)
MCV: 101.9 fL — ABNORMAL HIGH (ref 80.0–100.0)
Platelets: 260 10*3/uL (ref 150–400)
RBC: 3.71 MIL/uL — ABNORMAL LOW (ref 4.22–5.81)
RDW: 14 % (ref 11.5–15.5)
WBC: 6.7 10*3/uL (ref 4.0–10.5)
nRBC: 0 % (ref 0.0–0.2)

## 2019-05-28 LAB — SARS CORONAVIRUS 2 BY RT PCR (HOSPITAL ORDER, PERFORMED IN ~~LOC~~ HOSPITAL LAB): SARS Coronavirus 2: NEGATIVE

## 2019-05-28 LAB — BRAIN NATRIURETIC PEPTIDE: B Natriuretic Peptide: 1249 pg/mL — ABNORMAL HIGH (ref 0.0–100.0)

## 2019-05-28 LAB — TROPONIN I (HIGH SENSITIVITY)
Troponin I (High Sensitivity): 18 ng/L — ABNORMAL HIGH (ref <18)
Troponin I (High Sensitivity): 14 ng/L (ref <18)

## 2019-05-28 LAB — HEMOGLOBIN A1C
Hgb A1c MFr Bld: 5.6 % (ref 4.8–5.6)
Mean Plasma Glucose: 114.02 mg/dL

## 2019-05-28 MED ORDER — DEXTROSE 50 % IV SOLN
50.0000 mL | Freq: Once | INTRAVENOUS | Status: AC
  Administered 2019-05-28: 50 mL via INTRAVENOUS

## 2019-05-28 MED ORDER — ACETAMINOPHEN 650 MG RE SUPP: 650.0000 mg | Freq: Four times a day (QID) | RECTAL | Status: DC | PRN

## 2019-05-28 MED ORDER — FUROSEMIDE 10 MG/ML IJ SOLN
80.0000 mg | Freq: Once | INTRAMUSCULAR | Status: AC
  Administered 2019-05-28: 80 mg via INTRAVENOUS
  Filled 2019-05-28: qty 8

## 2019-05-28 MED ORDER — DEXTROSE 50 % IV SOLN
50.0000 mL | Freq: Once | INTRAVENOUS | Status: AC
  Administered 2019-05-28: 50 mL via INTRAVENOUS
  Filled 2019-05-28: qty 50

## 2019-05-28 MED ORDER — ACETAMINOPHEN 325 MG PO TABS: 650.0000 mg | ORAL_TABLET | Freq: Four times a day (QID) | ORAL | Status: DC | PRN

## 2019-05-28 MED ORDER — LOSARTAN POTASSIUM 25 MG PO TABS
12.5000 mg | ORAL_TABLET | Freq: Every day | ORAL | Status: DC
  Administered 2019-05-28: 12.5 mg via ORAL
  Filled 2019-05-28: qty 1

## 2019-05-28 MED ORDER — INSULIN ASPART 100 UNIT/ML ~~LOC~~ SOLN: 0.0000 [IU] | Freq: Every day | SUBCUTANEOUS | Status: DC

## 2019-05-28 MED ORDER — ONDANSETRON HCL 4 MG PO TABS: 4.0000 mg | ORAL_TABLET | Freq: Four times a day (QID) | ORAL | Status: DC | PRN

## 2019-05-28 MED ORDER — METOPROLOL SUCCINATE ER 25 MG PO TB24
12.5000 mg | ORAL_TABLET | Freq: Every day | ORAL | Status: DC
  Administered 2019-05-28: 12.5 mg via ORAL
  Filled 2019-05-28: qty 1

## 2019-05-28 MED ORDER — DEXTROSE 50 % IV SOLN
INTRAVENOUS | Status: AC
  Administered 2019-05-28: 50 mL via INTRAVENOUS
  Filled 2019-05-28: qty 50

## 2019-05-28 MED ORDER — ENOXAPARIN SODIUM 40 MG/0.4ML ~~LOC~~ SOLN
40.0000 mg | SUBCUTANEOUS | Status: DC
  Administered 2019-05-28: 40 mg via SUBCUTANEOUS
  Filled 2019-05-28: qty 0.4

## 2019-05-28 MED ORDER — POTASSIUM CHLORIDE CRYS ER 20 MEQ PO TBCR
20.0000 meq | EXTENDED_RELEASE_TABLET | Freq: Every day | ORAL | Status: DC
  Administered 2019-05-28: 20 meq via ORAL
  Filled 2019-05-28: qty 1

## 2019-05-28 MED ORDER — INSULIN ASPART 100 UNIT/ML ~~LOC~~ SOLN: 0.0000 [IU] | Freq: Three times a day (TID) | SUBCUTANEOUS | Status: DC

## 2019-05-28 MED ORDER — FUROSEMIDE 40 MG PO TABS: 40.0000 mg | ORAL_TABLET | Freq: Two times a day (BID) | ORAL | Status: DC

## 2019-05-28 MED ORDER — SODIUM CHLORIDE 0.9% FLUSH: 3.0000 mL | Freq: Two times a day (BID) | INTRAVENOUS | Status: DC

## 2019-05-28 MED ORDER — ONDANSETRON HCL 4 MG/2ML IJ SOLN: 4.0000 mg | Freq: Four times a day (QID) | INTRAMUSCULAR | Status: DC | PRN

## 2019-05-28 MED ORDER — FUROSEMIDE 10 MG/ML IJ SOLN
40.0000 mg | Freq: Two times a day (BID) | INTRAMUSCULAR | Status: DC
  Administered 2019-05-28: 40 mg via INTRAVENOUS
  Filled 2019-05-28: qty 4

## 2019-05-28 NOTE — ED Notes (Signed)
ED Provider at bedside. 

## 2019-05-28 NOTE — ED Notes (Signed)
Patient ambulated in hallway with no shortness of breath. Patient oxygen saturation 98% with ambulation.

## 2019-05-28 NOTE — ED Notes (Signed)
Patient given cracker and juice at this time.

## 2019-05-28 NOTE — Progress Notes (Signed)
[place] at [facility]                                                                                                                                                                                  Patient Demographics   Gary Bentley, is a 48 y.o. male, DOB - 02/11/1971, BVQ:[id number]  Admit date - 05/28/2019   Admitting Physician Mayer Camel, NP  Outpatient Primary MD for the patient is Patient, No Pcp Per   LOS - 0  Subjective: Patient admitted with acute CHF exacerbation and hypoglycemia Patient states that he was at a work party yesterday and ate a lot of food including hot dogs hamburgers his breathing is improved.   Review of Systems:   CONSTITUTIONAL: No documented fever. No fatigue, weakness. No weight gain, no weight loss.  EYES: No blurry or double vision.  ENT: No tinnitus. No postnasal drip. No redness of the oropharynx.  RESPIRATORY: No cough, no wheeze, no hemoptysis.  Positive for dyspnea.  CARDIOVASCULAR: No chest pain. No orthopnea. No palpitations. No syncope.  GASTROINTESTINAL: No nausea, no vomiting or diarrhea. No abdominal pain. No melena or hematochezia.  GENITOURINARY: No dysuria or hematuria.  ENDOCRINE: No polyuria or nocturia. No heat or cold intolerance.  HEMATOLOGY: No anemia. No bruising. No bleeding.  INTEGUMENTARY: No rashes. No lesions.  MUSCULOSKELETAL: No arthritis. No swelling. No gout.  NEUROLOGIC: No numbness, tingling, or ataxia. No seizure-type activity.  PSYCHIATRIC: No anxiety. No insomnia. No ADD.    Vitals:   Vitals:   05/28/19 0500 05/28/19 0604 05/28/19 0641 05/28/19 0745  BP: 106/70 124/74 122/81 128/90  Pulse: 63 68 69 (!) 59  Resp:  (!) 22 18 19   Temp:   97.9 F (36.6 C) 97.6 F (36.4 C)  TempSrc:    Oral  SpO2: 97% 99% 99% 97%  Weight:      Height:        Wt Readings from Last 3 Encounters:  05/27/19 107.5 kg  04/29/19 106.6 kg  04/06/19 106.1 kg     Intake/Output Summary  (Last 24 hours) at 05/28/2019 1303 Last data filed at 05/28/2019 1000 Gross per 24 hour  Intake 120 ml  Output 2500 ml  Net -2380 ml    Physical Exam:   GENERAL: Pleasant-appearing in no apparent distress.  HEAD, EYES, EARS, NOSE AND THROAT: Atraumatic, normocephalic. Extraocular muscles are intact. Pupils equal and reactive to light. Sclerae anicteric. No conjunctival injection. No oro-pharyngeal erythema.  NECK: Supple. There is no jugular venous distention. No bruits, no lymphadenopathy, no thyromegaly.  HEART: Regular rate and rhythm,. No murmurs, no rubs, no clicks.  LUNGS: Crackles bilaterally ABDOMEN: Soft,  flat, nontender, nondistended. Has good bowel sounds. No hepatosplenomegaly appreciated.  EXTREMITIES: No evidence of any cyanosis, clubbing, or 1+ peripheral edema.  +2 pedal and radial pulses bilaterally.  NEUROLOGIC: The patient is alert, awake, and oriented x3 with no focal motor or sensory deficits appreciated bilaterally.  SKIN: Moist and warm with no rashes appreciated.  Psych: Not anxious, depressed LN: No inguinal LN enlargement    Antibiotics   Anti-infectives (From admission, onward)   None      Medications   Scheduled Meds: . enoxaparin (LOVENOX) injection  40 mg Subcutaneous Q24H  . furosemide  40 mg Intravenous Q12H  . losartan  12.5 mg Oral Daily  . metoprolol succinate  12.5 mg Oral Daily  . potassium chloride SA  20 mEq Oral Daily  . sodium chloride flush  3 mL Intravenous Q12H   Continuous Infusions: PRN Meds:.acetaminophen **OR** acetaminophen, ondansetron **OR** ondansetron (ZOFRAN) IV   Data Review:   Micro Results Recent Results (from the past 240 hour(s))  SARS Coronavirus 2 (CEPHEID - Performed in [facility] hospital lab), Hosp Order     Status: None   Collection Time: 05/28/19  5:30 AM   Specimen: Nasopharyngeal Swab  Result Value Ref Range Status   SARS Coronavirus 2 NEGATIVE NEGATIVE Final    Comment: (NOTE) If result is  NEGATIVE SARS-CoV-2 target nucleic acids are NOT DETECTED. The SARS-CoV-2 RNA is generally detectable in upper and lower  respiratory specimens during the acute phase of infection. The lowest  concentration of SARS-CoV-2 viral copies this assay can detect is 250  copies / mL. A negative result does not preclude SARS-CoV-2 infection  and should not be used as the sole basis for treatment or other  patient management decisions.  A negative result may occur with  improper specimen collection / handling, submission of specimen other  than nasopharyngeal swab, presence of viral mutation(s) within the  areas targeted by this assay, and inadequate number of viral copies  (<250 copies / mL). A negative result must be combined with clinical  observations, patient history, and epidemiological information. If result is POSITIVE SARS-CoV-2 target nucleic acids are DETECTED. The SARS-CoV-2 RNA is generally detectable in upper and lower  respiratory specimens dur ing the acute phase of infection.  Positive  results are indicative of active infection with SARS-CoV-2.  Clinical  correlation with patient history and other diagnostic information is  necessary to determine patient infection status.  Positive results do  not rule out bacterial infection or co-infection with other viruses. If result is PRESUMPTIVE POSTIVE SARS-CoV-2 nucleic acids MAY BE PRESENT.   A presumptive positive result was obtained on the submitted specimen  and confirmed on repeat testing.  While 2019 novel coronavirus  (SARS-CoV-2) nucleic acids may be present in the submitted sample  additional confirmatory testing may be necessary for epidemiological  and / or clinical management purposes  to differentiate between  SARS-CoV-2 and other Sarbecovirus currently known to infect humans.  If clinically indicated additional testing with an alternate test  methodology [phone]) is advised. The SARS-CoV-2 RNA is generally  detectable  in upper and lower respiratory sp ecimens during the acute  phase of infection. The expected result is Negative. Fact Sheet for Patients:  StrictlyIdeas.no Fact Sheet for Healthcare Providers: BankingDealers.co.za This test is not yet approved or cleared by the [place] FDA and has been authorized for detection and/or diagnosis of SARS-CoV-2 by FDA under an Emergency Use Authorization (EUA).  This EUA will remain in effect (  meaning this test can be used) for the duration of the COVID-19 declaration under Section 564(b)(1) of the Act, 21 U.S.C. section 360bbb-3(b)(1), unless the authorization is terminated or revoked sooner. Performed at [facility], [address] [place], [place] [address]     Radiology Reports Dg Chest 1 View  Result Date: 05/28/2019 CLINICAL DATA:  Short of breath EXAM: CHEST  1 VIEW COMPARISON:  04/29/2019, 03/26/2019 FINDINGS: Cardiomegaly.  No acute consolidation or effusion.  No pneumothorax. IMPRESSION: No active disease.  Cardiomegaly Electronically Signed   By: Donavan Foil M.D.   On: 05/28/2019 01:50   Dg Chest 2 View  Result Date: 04/29/2019 CLINICAL DATA:  Shortness of breath EXAM: CHEST - 2 VIEW COMPARISON:  03/26/2019 FINDINGS: Cardiomegaly. Both lungs are clear. The visualized skeletal structures are unremarkable. IMPRESSION: Cardiomegaly without acute abnormality of the lungs. Electronically Signed   By: Eddie Candle M.D.   On: 04/29/2019 23:33     CBC Recent Labs  Lab 05/27/19 2319 05/28/19 0715  WBC 6.7 6.7  HGB 11.6* 11.8*  HCT 37.1* 37.8*  PLT 248 260  MCV 102.2* 101.9*  MCH 32.0 31.8  MCHC 31.3 31.2  RDW 14.0 14.0    Chemistries  Recent Labs  Lab 05/27/19 2319 05/28/19 0715  NA 139 137  K 3.8 4.0  CL 109 108  CO2 24 23  GLUCOSE 94 108*  BUN 19 19  CREATININE 1.17 0.93  CALCIUM 8.5* 8.7*    ------------------------------------------------------------------------------------------------------------------ estimated creatinine clearance is 135.5 mL/min (by C-G formula based on SCr of 0.93 mg/dL). ------------------------------------------------------------------------------------------------------------------ Recent Labs    05/28/19 0437  HGBA1C 5.6   ------------------------------------------------------------------------------------------------------------------ No results for input(s): CHOL, HDL, LDLCALC, TRIG, CHOLHDL, LDLDIRECT in the last 72 hours. ------------------------------------------------------------------------------------------------------------------ No results for input(s): TSH, T4TOTAL, T3FREE, THYROIDAB in the last 72 hours.  Invalid input(s): FREET3 ------------------------------------------------------------------------------------------------------------------ No results for input(s): VITAMINB12, FOLATE, FERRITIN, TIBC, IRON, RETICCTPCT in the last 72 hours.  Coagulation profile No results for input(s): INR, PROTIME in the last 168 hours.  No results for input(s): DDIMER in the last 72 hours.  Cardiac Enzymes No results for input(s): CKMB, TROPONINI, MYOGLOBIN in the last 168 hours.  Invalid input(s): CK ------------------------------------------------------------------------------------------------------------------ Invalid input(s): [facility]   1.   Acute exacerbation systolic CHF-echocardiogram with 15% ejection fraction May 2020 -  Continue Lasix 40 mg IV twice daily - Will monitor renal function closely during diuresis -  Follow electrolytes- Strict intake and output - Telemetry monitoring - We will trend troponin levels --Cardiology consult appreciated - Metoprolol restarted  2.  Diabetes mellitus with hypoglycemia - Hypoglycemic protocol - Sliding scale insulin moderate scale -Diabetic coordinator  consulted   3.  Hypertension - Continue losartan - Treat persistent hypertension expectantly  DVT and PPI prophylaxis have been initiated      Code Status Orders  (From admission, onward)         Start     Ordered   05/28/19 0539  Full code  Continuous     05/28/19 0538        Code Status History    Date Active Date Inactive Code Status Order ID Comments User Context   03/26/2019 0742 03/29/2019 1720 Full Code [id number]  [name], MD ED   01/10/2019 0055 01/15/2019 1505 Full Code [id number]  Arta Silence, MD ED   03/22/2018 1300 03/24/2018 2113 Full Code [id number]  Gladstone Lighter, MD ED   09/04/2017 1340 09/05/2017 1617 Full Code [id number]  Idelle Crouch, MD ED   Advance Care Planning Activity           Consults cardiology  DVT Prophylaxis  Lovenox  Lab Results  Component Value Date   PLT 260 05/28/2019     Time Spent in minutes 45 minutes  12pm to 1245 pmgreater than 50% of time spent in care coordination and counseling patient regarding the condition and plan of care.   Dustin Flock M.D on 05/28/2019 at 1:03 PM  Between 7am to 6pm - Pager - [phone]  After 6pm go to [email or web address] - Proofreader  [employer]   Office  [phone]

## 2019-05-28 NOTE — ED Notes (Signed)
Admitting md at bedside

## 2019-05-28 NOTE — H&P (Signed)
[facility] [place] NAME: Gary Bentley    MR#:  [id number]  DATE OF BIRTH:  11/02/1972  DATE OF ADMISSION:  05/28/2019  PRIMARY CARE PHYSICIAN: Patient, No Pcp Per   REQUESTING/REFERRING PHYSICIAN:[place] Alfred Levins, MD  CHIEF COMPLAINT:   Chief Complaint  Patient presents with  . Shortness of Breath    HISTORY OF PRESENT ILLNESS:  Gary Bentley  is a 48 y.o. male with a known history of hypertension, diabetes mellitus and CHF with history of EF of 15%.  He presented to the emergency room complaining of increased shortness of breath and orthopnea.  He reported a 7 pound weight gain over the last 3 to 4 days.  He is taking Lasix 40 mg twice daily.  He reports excellent compliance with this as well.  He has noted some increased edema in his bilateral lower extremities.  He also notes a cough which is worse at night and when lying down.  Cough is productive of clear mucus.  He does not have a history of pulmonary embolism or DVT.  He denies chest pain.  Patient began becoming diaphoretic and clammy while in the waiting room according to documentation and was found to be hypoglycemic upon being brought back into the exam room.  He reports having taken insulin both short-acting and long-acting insulin at 8 PM.  However, he did not eat dinner following this as he became increasingly short of breath and came to the hospital instead.  Glucose was found to be 49.  Patient received 1 amp D50 at that time.  Labs were also completed finding BNP of 1249.  Chest x-ray demonstrated no acute pulmonary disease.  He has been admitted to the hospital service for further management.   PAST MEDICAL HISTORY:   Past Medical History:  Diagnosis Date  . CHF (congestive heart failure) ([place])   . Diabetes mellitus without complication ([place])     PAST SURGICAL HISTORY:   Past Surgical History:  Procedure Laterality Date  . FOOT SURGERY    . RIGHT/LEFT HEART  CATH AND CORONARY ANGIOGRAPHY N/A 01/13/2019   Procedure: RIGHT/LEFT HEART CATH AND CORONARY ANGIOGRAPHY;  Surgeon: Minna Merritts, MD;  Location: [facility];  Service: Cardiovascular;  Laterality: N/A;    SOCIAL HISTORY:   Social History   Tobacco Use  . Smoking status: Former Smoker    Packs/day: 1.00    Types: Cigarettes    Quit date: 04/17/2017    Years since quitting: 2.1  . Smokeless tobacco: Never Used  Substance Use Topics  . Alcohol use: No    FAMILY HISTORY:   Family History  Family history unknown: Yes    DRUG ALLERGIES:  No Known Allergies  REVIEW OF SYSTEMS:   Review of Systems  Constitutional: Negative for chills, fever and malaise/fatigue.  HENT: Negative for congestion, sinus pain and sore throat.   Eyes: Negative for blurred vision and double vision.  Respiratory: Positive for cough and shortness of breath. Negative for hemoptysis and sputum production.   Cardiovascular: Positive for leg swelling. Negative for chest pain and palpitations.  Gastrointestinal: Negative for abdominal pain, constipation, diarrhea, heartburn, nausea and vomiting.  Genitourinary: Negative for dysuria, flank pain and frequency.  Musculoskeletal: Negative for falls, joint pain and myalgias.  Skin: Negative for itching and rash.  Neurological: Negative for dizziness, sensory change, speech change, loss of consciousness, weakness and headaches.  Psychiatric/Behavioral: Negative.  Negative for depression.    MEDICATIONS  AT HOME:   Prior to Admission medications   Medication Sig Start Date End Date Taking? Authorizing Provider  furosemide (LASIX) 20 MG tablet Take 2 tablets (40 mg total) by mouth 2 (two) times daily. 04/30/19  Yes Alfred Levins, [place], MD  insulin aspart protamine - aspart (NOVOLOG MIX 70/30 FLEXPEN) (70-30) 100 UNIT/ML FlexPen Inject 0.1 mLs (10 Units total) into the skin 2 (two) times daily. 01/15/19  Yes Dustin Flock, MD  losartan (COZAAR) 25 MG  tablet Take 0.5 tablets (12.5 mg total) by mouth daily. 03/30/19 05/29/19 Yes Sainani, Belia Heman, MD  metoprolol succinate (TOPROL-XL) 25 MG 24 hr tablet Take 0.5 tablets (12.5 mg total) by mouth daily. 03/30/19 05/29/19 Yes Sainani, Belia Heman, MD  potassium chloride 20 MEQ TBCR Take 20 mEq by mouth daily. 03/29/19 05/28/19 Yes Sainani, Belia Heman, MD      VITAL SIGNS:  Blood pressure (!) 127/91, pulse 78, temperature 98.7 F (37.1 C), temperature source Oral, resp. rate (!) 22, height 6\' 5"  (1.956 m), weight 107.5 kg, SpO2 97 %.  PHYSICAL EXAMINATION:  Physical Exam  GENERAL:  48 y.o.-year-old patient lying in the bed with no acute distress.  EYES: Pupils equal, round, reactive to light and accommodation. No scleral icterus. Extraocular muscles intact.  HEENT: Head atraumatic, normocephalic. Oropharynx and nasopharynx clear.  NECK:  Supple, Mild jugular venous distention. No thyroid enlargement, no tenderness.  LUNGS:Bilateral rales in the bases. No use of accessory muscles of respiration.  CARDIOVASCULAR: Regular rate and rhythm, S1, S2 normal. No murmurs, rubs, or gallops.  ABDOMEN: Soft, nondistended, nontender. Bowel sounds present. No organomegaly or mass.  EXTREMITIES: trace bilateral pitting 1+ pedal edema, cyanosis, or clubbing.  NEUROLOGIC: Cranial nerves II through XII are intact. Muscle strength 5/5 in all extremities. Sensation intact. Gait not checked.  PSYCHIATRIC: The patient is alert and oriented x 3.  Normal affect and good eye contact. SKIN: No obvious rash, lesion, or ulcer.   LABORATORY PANEL:   CBC Recent Labs  Lab 05/27/19 2319  WBC 6.7  HGB 11.6*  HCT 37.1*  PLT 248   ------------------------------------------------------------------------------------------------------------------  Chemistries  Recent Labs  Lab 05/27/19 2319  NA 139  K 3.8  CL 109  CO2 24  GLUCOSE 94  BUN 19  CREATININE 1.17  CALCIUM 8.5*    ------------------------------------------------------------------------------------------------------------------  Cardiac Enzymes No results for input(s): TROPONINI in the last 168 hours. ------------------------------------------------------------------------------------------------------------------  RADIOLOGY:  Dg Chest 1 View  Result Date: 05/28/2019 CLINICAL DATA:  Short of breath EXAM: CHEST  1 VIEW COMPARISON:  04/29/2019, 03/26/2019 FINDINGS: Cardiomegaly.  No acute consolidation or effusion.  No pneumothorax. IMPRESSION: No active disease.  Cardiomegaly Electronically Signed   By: Donavan Foil M.D.   On: 05/28/2019 01:50      IMPRESSION AND PLAN:   1.   Acute exacerbation systolic CHF-echocardiogram with 15% ejection fraction May 2020 - Lasix 40 mg IV twice daily - Will monitor renal function closely during diuresis - CBC and BMP in the a.m. - Strict intake and output - Telemetry monitoring - We will trend troponin levels --Cardiology consult - Metoprolol restarted  2.  Diabetes mellitus with hypoglycemia - Hypoglycemic protocol - Sliding scale insulin moderate scale -We will consult diabetic educator for further education regarding diabetes management  3.  Hypertension - Losartan restarted - Treat persistent hypertension expectantly  DVT and PPI prophylaxis have been initiated    All the records are reviewed and case discussed with ED provider. The plan of care was discussed in  details with the patient (and family). I answered all questions. The patient agreed to proceed with the above mentioned plan. Further management will depend upon hospital course.   CODE STATUS: Full code  TOTAL TIME TAKING CARE OF THIS PATIENT: 74minutes.    [place] on 05/28/2019 at 4:19 AM  Pager - [phone]  After 6pm go to [email or web address] - Proofreader  [facility]  Office  [phone]  CC: Primary care physician; Patient,  No Pcp Per   Note: This dictation was prepared with Dragon dictation along with smaller phrase technology. Any transcriptional errors that result from this process are unintentional.

## 2019-05-28 NOTE — ED Notes (Signed)
Patient given orange juice and peanut butter crackers at this time.

## 2019-05-28 NOTE — Progress Notes (Signed)
Pt states that he "feels great" he doesn't understand why he was "admitted to start with" he wanted discharge orders, Gardiner Barefoot made aware, per NP he will have to leave AMA. I explained to the pt and pt stated he wants to go home. AC made aware.

## 2019-05-28 NOTE — ED Notes (Addendum)
Pt eating graham crackers, states he feels like his blood sugar is dropping. Pt is very diaphoretic, assisted into wheelchair. Pt told another individual in lobby that he felt like his blood sugar was dropping who alerted first RN. Pt taken to room one in triage, sugar 46. Pt continues to eat graham crackers. Crackles heard.

## 2019-05-28 NOTE — Progress Notes (Signed)
Inpatient Diabetes Program Recommendations  AACE/ADA: New Consensus Statement on Inpatient Glycemic Control (2015)  Target Ranges:  Prepandial:   less than 140 mg/dL      Peak postprandial:   less than 180 mg/dL (1-2 hours)      Critically ill patients:  140 - 180 mg/dL   Lab Results  Component Value Date   GLUCAP 117 (H) 05/28/2019   HGBA1C 5.6 05/28/2019    Review of Glycemic Control Results for TRELL, SECRIST (MRN [id number]) as of 05/28/2019 12:03  Ref. Range 05/28/2019 00:55 05/28/2019 01:12 05/28/2019 01:39 05/28/2019 02:12 05/28/2019 03:19 05/28/2019 04:30 05/28/2019 05:08 05/28/2019 06:02 05/28/2019 06:40  Glucose-Capillary Latest Ref Range: 70 - 99 mg/dL 46 (L) 105 (H) 97 110 (H) 63 (L) 121 (H) 154 (H) 115 (H) 117 (H)   Diabetes history: DM 2 Outpatient Diabetes medications: 70/30 10 units bid (pt. Takes 30 units bid?) Current orders for Inpatient glycemic control:  None  Inpatient Diabetes Program Recommendations:    Review of chart remotely. A1C indicates average CBG's of 114 mg/dL (5.2%).  This is much lower then earlier in the year.  I suspect that patient may be having lows at home.   Please consider adding Novolog sensitive tid with meals and HS.  If blood sugars increase, start low dose basal such as Levemir 5 units bid. Will f/u with patient on 7/6.   Thanks,  Adah Perl, RN, BC-ADM Inpatient Diabetes Coordinator Pager [phone]

## 2019-05-28 NOTE — Consult Note (Signed)
Cardiology Consultation Note    Patient ID: Gary Bentley, MRN: [id number], DOB/AGE: 47/09/1972 47 y.o. Admit date: 05/28/2019   Date of Consult: 05/28/2019 Primary Physician: Patient, No Pcp Per Primary Cardiologist: Dr. Clayborn Bentley  Chief Complaint: sob Reason for Consultation: Gary Bentley Requesting MD: Dr. Dustin Bentley  HPI: Gary Bentley is a 48 y.o. male with history of non ischemic cardiomyopathy with cardiac cath in 2/20 revealing no significant coronary artery disease EF 20% with global hypokinesis with no apical akinesis, hx of apical thrombi by echo in 5/20 read by Dr. Sabra Bentley, insulin dependent diabetes who was admitted after presenting to the er with complaints of 7 pound weight gain and increased sob. He denied chest pain. He was noted to be hypoglycemic with glucose of 49. CXR showed on acute cardiopulmonary disease with no evidence of acute chf.  Renal function was normal. HS troponin was drawn and was 20. He is treated with lasix 40 mg po, metoprolol 12.5 mg daily, losartan 12.5 mg daily,  as outpatient and reports compliance.   Past Medical History:  Diagnosis Date  . CHF (congestive heart failure) ([place])   . Diabetes mellitus without complication [facility])       Surgical History:  Past Surgical History:  Procedure Laterality Date  . FOOT SURGERY    . RIGHT/LEFT HEART CATH AND CORONARY ANGIOGRAPHY N/A 01/13/2019   Procedure: RIGHT/LEFT HEART CATH AND CORONARY ANGIOGRAPHY;  Surgeon: Gary Merritts, MD;  Location: [facility];  Service: Cardiovascular;  Laterality: N/A;     Home Meds: Prior to Admission medications   Medication Sig Start Date End Date Taking? Authorizing Provider  furosemide (LASIX) 20 MG tablet Take 2 tablets (40 mg total) by mouth 2 (two) times daily. 04/30/19  Yes Gary Bentley, [place], MD  insulin aspart protamine - aspart (NOVOLOG MIX 70/30 FLEXPEN) (70-30) 100 UNIT/ML FlexPen Inject 0.1 mLs (10 Units total) into the skin 2 (two) times daily. 01/15/19   Yes Gary Flock, MD  losartan (COZAAR) 25 MG tablet Take 0.5 tablets (12.5 mg total) by mouth daily. 03/30/19 05/29/19 Yes Bentley, Gary Heman, MD  metoprolol succinate (TOPROL-XL) 25 MG 24 hr tablet Take 0.5 tablets (12.5 mg total) by mouth daily. 03/30/19 05/29/19 Yes Bentley, Gary Heman, MD  potassium chloride 20 MEQ TBCR Take 20 mEq by mouth daily. 03/29/19 05/28/19 Yes Gary Leber, MD    Inpatient Medications:  . enoxaparin (LOVENOX) injection  40 mg Subcutaneous Q24H  . furosemide  40 mg Intravenous Q12H  . losartan  12.5 mg Oral Daily  . metoprolol succinate  12.5 mg Oral Daily  . potassium chloride SA  20 mEq Oral Daily  . sodium chloride flush  3 mL Intravenous Q12H     Allergies: No Known Allergies  Social History   Socioeconomic History  . Marital status: Single    Spouse name: Not on file  . Number of children: Not on file  . Years of education: Not on file  . Highest education level: Not on file  Occupational History  . Not on file  Social Needs  . Financial resource strain: Not on file  . Food insecurity    Worry: Not on file    Inability: Not on file  . Transportation needs    Medical: Not on file    Non-medical: Not on file  Tobacco Use  . Smoking status: Former Smoker    Packs/day: 1.00    Types: Cigarettes    Quit date: 04/17/2017  Years since quitting: 2.1  . Smokeless tobacco: Never Used  Substance and Sexual Activity  . Alcohol use: No  . Drug use: No  . Sexual activity: Not on file  Lifestyle  . Physical activity    Days per week: Not on file    Minutes per session: Not on file  . Stress: Not on file  Relationships  . Social Herbalist on phone: Not on file    Gets together: Not on file    Attends religious service: Not on file    Active member of club or organization: Not on file    Attends meetings of clubs or organizations: Not on file    Relationship status: Not on file  . Intimate partner violence    Fear of current or ex  partner: Not on file    Emotionally abused: Not on file    Physically abused: Not on file    Forced sexual activity: Not on file  Other Topics Concern  . Not on file  Social History Narrative   No place to live at this time, independent otherwise     Family History  Family history unknown: Yes     Review of Systems: A 12-system review of systems was performed and is negative except as noted in the HPI.  Labs: No results for input(s): CKTOTAL, CKMB, TROPONINI in the last 72 hours. Lab Results  Component Value Date   WBC 6.7 05/28/2019   HGB 11.8 (L) 05/28/2019   HCT 37.8 (L) 05/28/2019   MCV 101.9 (H) 05/28/2019   PLT 260 05/28/2019    Recent Labs  Lab 05/28/19 0715  NA 137  K 4.0  CL 108  CO2 23  BUN 19  CREATININE 0.93  CALCIUM 8.7*  GLUCOSE 108*   No results found for: CHOL, HDL, LDLCALC, TRIG No results found for: DDIMER  Radiology/Studies:  Dg Chest 1 View  Result Date: 05/28/2019 CLINICAL DATA:  Short of breath EXAM: CHEST  1 VIEW COMPARISON:  04/29/2019, 03/26/2019 FINDINGS: Cardiomegaly.  No acute consolidation or effusion.  No pneumothorax. IMPRESSION: No active disease.  Cardiomegaly Electronically Signed   By: Donavan Foil M.D.   On: 05/28/2019 01:50   Dg Chest 2 View  Result Date: 04/29/2019 CLINICAL DATA:  Shortness of breath EXAM: CHEST - 2 VIEW COMPARISON:  03/26/2019 FINDINGS: Cardiomegaly. Both lungs are clear. The visualized skeletal structures are unremarkable. IMPRESSION: Cardiomegaly without acute abnormality of the lungs. Electronically Signed   By: Eddie Candle M.D.   On: 04/29/2019 23:33    Wt Readings from Last 3 Encounters:  05/27/19 107.5 kg  04/29/19 106.6 kg  04/06/19 106.1 kg    EKG: nsr with nssttw changes  Physical Exam:  Blood pressure 128/90, pulse (!) 59, temperature 97.6 F (36.4 C), temperature source Oral, resp. rate 19, height 6\' 5"  (1.956 m), weight 107.5 kg, SpO2 97 %. Body mass index is 28.1 kg/m. General: Well  developed, well nourished, in no acute distress. Head: Normocephalic, atraumatic, sclera non-icteric, no xanthomas, nares are without discharge.  Neck: Negative for carotid bruits. JVD not elevated. Lungs: Clear bilaterally to auscultation without wheezes, rales, or rhonchi. Breathing is unlabored. Heart: RRR with S1 S2. No murmurs, rubs, or gallops appreciated. Abdomen: Soft, non-tender, non-distended with normoactive bowel sounds. No hepatomegaly. No rebound/guarding. No obvious abdominal masses. Msk:  Strength and tone appear normal for age. Extremities: No clubbing or cyanosis. No edema.  Distal pedal pulses are 2+ and equal bilaterally.  Neuro: Alert and oriented X 3. No facial asymmetry. No focal deficit. Moves all extremities spontaneously. Psych:  Responds to questions appropriately with a normal affect.     Assessment and Plan  48 year old male with nonischemic cardiomyopathy with global hypokinesis EF 20% by cardiac catheterization in February of this year with no evidence of coronary artery disease who was admitted with increasing shortness of breath and reported weight gain.  He is a somewhat resistant historian this morning however denies chest pain or shortness of breath.  He had no chest pain on presentation to the emergency room or ischemic changes on his electrocardiogram however high-sensitivity troponin was drawn which revealed levels of 20 and 18.  These are secondary to reduced LV function.  Chest x-ray revealed no pulmonary edema.  Patient appears to be on furosemide, losartan, metoprolol as an outpatient in admission note however note from cardiology office a month and a half ago suggest that lisinopril was stopped which he apparently was on at the time and Delene Loll was added.  Echo read by Dr. Sabra Bentley reportedly showed apical thrombi.  Echo done in February 2020 showed no apical hypokinesis and no thrombi.  Would not start anticoagulation at present.  Given lack of apical  akinesis, apical thrombi are less likely.  Transesophageal echo was discussed however transesophageal echo would not give any further information regarding the apex of the heart which is not well visualized by TEE.  States he takes his medications.  Patient appears to be at his baseline today.  Would again stressed low-sodium diet, continue with losartan and Lasix.  Will defer decision regarding Entresto to outpatient work-up.  Ivin Booty MD 05/28/2019, 8:07 AM Pager: [phone]

## 2019-05-28 NOTE — ED Notes (Signed)
Selinda Eon, rn in to room 12 to attempt iv insertion.

## 2019-05-28 NOTE — ED Provider Notes (Signed)
[facility] Department Provider Note  ____________________________________________  Time seen: Approximately 1:08 AM  I have reviewed the triage vital signs and the nursing notes.   HISTORY  Chief Complaint Shortness of Breath   HPI Anthem Frazer is a 48 y.o. male with a history of CHF with a EF of 15%, diabetes, HTN who presents for evaluation of weight gain and shortness of breath. Patient reports 2-3 days of progressively worsening shortness of breath and orthopnea.  Has gained 7 pounds at home.  Endorses compliance with his Lasix.  Patient takes 40 mg twice daily.  He denies chest pain or fever.  Has had a cough productive of clear phlegm and mild edema on his legs.  No hemoptysis, no prior history of PE or DVT.  Patient reports taking his insulin at 8 PM but because of shortness of breath got worse he did have dinner and came to the hospital instead.  While in the waiting room patient started becoming diaphoretic and clammy.  Was found to be hypoglycemic.  Past Medical History:  Diagnosis Date  . CHF (congestive heart failure) ([place])   . Diabetes mellitus without complication [facility])     Patient Active Problem List   Diagnosis Date Noted  . Acute on chronic systolic CHF (congestive heart failure) ([place]) 03/26/2019  . Elevated troponin   . Peripheral edema   . Shortness of breath   . Influenza B 01/09/2019  . Cellulitis 03/22/2018  . Osteomyelitis ([place]) 09/04/2017  . Diabetes mellitus due to underlying condition with foot ulcer (CODE) ([place]) 09/04/2017  . Hyperglycemia 09/04/2017  . Foot pain, bilateral 09/04/2017    Past Surgical History:  Procedure Laterality Date  . FOOT SURGERY    . RIGHT/LEFT HEART CATH AND CORONARY ANGIOGRAPHY N/A 01/13/2019   Procedure: RIGHT/LEFT HEART CATH AND CORONARY ANGIOGRAPHY;  Surgeon: Minna Merritts, MD;  Location: [facility];  Service: Cardiovascular;  Laterality: N/A;    Prior to Admission  medications   Medication Sig Start Date End Date Taking? Authorizing Provider  furosemide (LASIX) 20 MG tablet Take 2 tablets (40 mg total) by mouth 2 (two) times daily. 04/30/19   Rudene Re, MD  insulin aspart protamine - aspart (NOVOLOG MIX 70/30 FLEXPEN) (70-30) 100 UNIT/ML FlexPen Inject 0.1 mLs (10 Units total) into the skin 2 (two) times daily. 01/15/19   Dustin Flock, MD  losartan (COZAAR) 25 MG tablet Take 0.5 tablets (12.5 mg total) by mouth daily. 03/30/19 05/29/19  Henreitta Leber, MD  metoprolol succinate (TOPROL-XL) 25 MG 24 hr tablet Take 0.5 tablets (12.5 mg total) by mouth daily. 03/30/19 05/29/19  Henreitta Leber, MD  potassium chloride 20 MEQ TBCR Take 20 mEq by mouth daily. 03/29/19 05/28/19  Henreitta Leber, MD    Allergies Patient has no known allergies.  Family History  Family history unknown: Yes    Social History Social History   Tobacco Use  . Smoking status: Former Smoker    Packs/day: 1.00    Types: Cigarettes    Quit date: 04/17/2017    Years since quitting: 2.1  . Smokeless tobacco: Never Used  Substance Use Topics  . Alcohol use: No  . Drug use: No    Review of Systems  Constitutional: Negative for fever. + Weight gain Eyes: Negative for visual changes. ENT: Negative for sore throat. Neck: No neck pain  Cardiovascular: Negative for chest pain. Respiratory: + shortness of breath. Gastrointestinal: Negative for abdominal pain, vomiting or diarrhea.  Genitourinary: Negative for dysuria. Musculoskeletal: Negative for back pain. Skin: Negative for rash. Neurological: Negative for headaches, weakness or numbness. Psych: No SI or HI  ____________________________________________   PHYSICAL EXAM:  VITAL SIGNS: ED Triage Vitals [05/27/19 2313]  Enc Vitals Group     BP 128/79     Pulse Rate 86     Resp (!) 22     Temp 98.7 F (37.1 C)     Temp Source Oral     SpO2 98 %     Weight 237 lb (107.5 kg)     Height 6\' 5"  (1.956 m)     Head  Circumference      Peak Flow      Pain Score      Pain Loc      Pain Edu?      Excl. in [place]?     Constitutional: Alert and oriented, clammy and diaphoretic.  HEENT:      Head: Normocephalic and atraumatic.         Eyes: Conjunctivae are normal. Sclera is non-icteric.       Mouth/Throat: Mucous membranes are moist.       Neck: Supple with no signs of meningismus. Cardiovascular: Regular rate and rhythm. No murmurs, gallops, or rubs. 2+ symmetrical distal pulses are present in all extremities. Elevated JVD to angle of the mandible Respiratory:  Crackles on bilateral bases, tachypneic with no hypoxia Gastrointestinal: Soft, non tender, and non distended with positive bowel sounds. No rebound or guarding. Musculoskeletal: Trace pitting edema bilaterally. Neurologic: Normal speech and language. Face is symmetric. Moving all extremities. No gross focal neurologic deficits are appreciated. Skin: Skin is warm, dry and intact. No rash noted. Psychiatric: Mood and affect are normal. Speech and behavior are normal.  ____________________________________________   LABS (all labs ordered are listed, but only abnormal results are displayed)  Labs Reviewed  BASIC METABOLIC PANEL - Abnormal; Notable for the following components:      Result Value   Calcium 8.5 (*)    All other components within normal limits  CBC - Abnormal; Notable for the following components:   RBC 3.63 (*)    Hemoglobin 11.6 (*)    HCT 37.1 (*)    MCV 102.2 (*)    All other components within normal limits  TROPONIN I (HIGH SENSITIVITY) - Abnormal; Notable for the following components:   Troponin I (High Sensitivity) 20 (*)    All other components within normal limits  TROPONIN I (HIGH SENSITIVITY) - Abnormal; Notable for the following components:   Troponin I (High Sensitivity) 18 (*)    All other components within normal limits  GLUCOSE, CAPILLARY - Abnormal; Notable for the following components:   Glucose-Capillary  46 (*)    All other components within normal limits  BRAIN NATRIURETIC PEPTIDE - Abnormal; Notable for the following components:   B Natriuretic Peptide 1,249.0 (*)    All other components within normal limits  GLUCOSE, CAPILLARY - Abnormal; Notable for the following components:   Glucose-Capillary 105 (*)    All other components within normal limits  GLUCOSE, CAPILLARY - Abnormal; Notable for the following components:   Glucose-Capillary 110 (*)    All other components within normal limits  GLUCOSE, CAPILLARY - Abnormal; Notable for the following components:   Glucose-Capillary 63 (*)    All other components within normal limits  GLUCOSE, CAPILLARY  CBG MONITORING, ED  CBG MONITORING, ED  CBG MONITORING, ED  CBG MONITORING, ED   ____________________________________________  EKG  ED ECG REPORT I, Rudene Re, the attending physician, personally viewed and interpreted this ECG.  Normal sinus rhythm, rate of 84, normal intervals, LVH, left axis deviation, T wave inversions in 1 and aVL.  Unchanged from prior. ____________________________________________  RADIOLOGY  I have personally reviewed the images performed during this visit and I agree with the Radiologist's read.   Interpretation by Radiologist:  Dg Chest 1 View  Result Date: 05/28/2019 CLINICAL DATA:  Short of breath EXAM: CHEST  1 VIEW COMPARISON:  04/29/2019, 03/26/2019 FINDINGS: Cardiomegaly.  No acute consolidation or effusion.  No pneumothorax. IMPRESSION: No active disease.  Cardiomegaly Electronically Signed   By: Donavan Foil M.D.   On: 05/28/2019 01:50      ____________________________________________   PROCEDURES  Procedure(s) performed: None Procedures Critical Care performed: yes  CRITICAL CARE Performed by: Rudene Re  ?  Total critical care time: 30 min  Critical care time was exclusive of separately billable procedures and treating other patients.  Critical care was  necessary to treat or prevent imminent or life-threatening deterioration.  Critical care was time spent personally by me on the following activities: development of treatment plan with patient and/or surrogate as well as nursing, discussions with consultants, evaluation of patient's response to treatment, examination of patient, obtaining history from patient or surrogate, ordering and performing treatments and interventions, ordering and review of laboratory studies, ordering and review of radiographic studies, pulse oximetry and re-evaluation of patient's condition.  ____________________________________________   INITIAL IMPRESSION / ASSESSMENT AND PLAN / ED COURSE  48 y.o. male with a history of CHF with a EF of 15%, diabetes, HTN who presents for evaluation of weight gain and shortness of breath.  Patient is hypervolemic with elevated JVD, bilateral crackles, 7 pound weight gain, and trace pitting edema bilaterally.  No hypoxia. Patient is on 80 mg of Lasix daily.  Will give 80 mg IV.  Will check basic labs.  Will wait for patient to diurese and reassess.  Patient also hypoglycemic in the waiting room after taking his 70/30 insulin without any food.  Patient received an amp of D50 and orange juice.  Will monitor with serial CBGs.  Clinical Course as of May 28 347  Sun May 28, 2019  0344 Patient diuresed well and feels improved from his respiratory status.  Remains with no oxygen requirement both at rest and with ambulation.  However his glucose keeps dropping in spite of an amp of D50, crackers, peanut butter, and several cups of orange juice.  Glucose is now 63.  Since patient's insulin contains long-acting and short acting I think it is safer to keep patient under observation overnight so his sugars can be monitored.  I will he give him another D50 bolus.  Discussed with hospitalist for admission peer   [CV]    Clinical Course User Index [CV] Alfred Levins [place], MD     As part of my  medical decision making, I reviewed the following data within the [place] notes reviewed and incorporated, Labs reviewed , EKG interpreted , Old EKG reviewed, Old chart reviewed, Radiograph reviewed , Discussed with admitting physician , Notes from prior ED visits and [place] Controlled Substance Database    Pertinent labs & imaging results that were available during my care of the patient were reviewed by me and considered in my medical decision making (see chart for details).    ____________________________________________   FINAL CLINICAL IMPRESSION(S) / ED DIAGNOSES  Final diagnoses:  Hypoglycemia  Acute on chronic congestive heart failure, unspecified heart failure type ([place])      NEW MEDICATIONS STARTED DURING THIS VISIT:  ED Discharge Orders    None       Note:  This document was prepared using Dragon voice recognition software and may include unintentional dictation errors.    Alfred Levins, [place], MD 05/28/19 [phone]

## 2019-05-28 NOTE — ED Notes (Signed)
ED TO INPATIENT HANDOFF REPORT  ED Nurse Name and Phone #: [phone]  S Name/Age/Gender Gary [facility] 48 y.o. male Room/Bed: ED12A/ED12A  Code Status   Code Status: Full Code  Home/SNF/Other Home Patient oriented to: self, place, time and situation Is this baseline? Yes   Triage Complete: Triage complete  Chief Complaint diff breathing  Triage Note Pt arrives POV to triage with c/o SOB x 2 days. Pt reports hx of CHF. Pt has clear lung sounds throughout all fields at this time and appears in NAD.    Allergies No Known Allergies  Level of Care/Admitting Diagnosis ED Disposition    ED Disposition Condition Comment   Admit  Hospital Area: [facility] [[id number]]  Level of Care: Med-Surg [16]  Covid Evaluation: Asymptomatic Screening Protocol (No Symptoms)  Diagnosis: Acute exacerbation of CHF (congestive heart failure) [facility]) [938101]  Admitting Physician: Mayer Camel [[id number]]  Attending Physician: Mayer Camel [[id number]]  Estimated length of stay: past midnight tomorrow  Certification:: I certify this patient will need inpatient services for at least 2 midnights  PT Class (Do Not Modify): Inpatient [101]  PT Acc Code (Do Not Modify): Private [1]       B Medical/Surgery History Past Medical History:  Diagnosis Date  . CHF (congestive heart failure) ([place])   . Diabetes mellitus without complication [facility])    Past Surgical History:  Procedure Laterality Date  . FOOT SURGERY    . RIGHT/LEFT HEART CATH AND CORONARY ANGIOGRAPHY N/A 01/13/2019   Procedure: RIGHT/LEFT HEART CATH AND CORONARY ANGIOGRAPHY;  Surgeon: Minna Merritts, MD;  Location: [facility];  Service: Cardiovascular;  Laterality: N/A;     A IV Location/Drains/Wounds Patient Lines/Drains/Airways Status   Active Line/Drains/Airways    Name:   Placement date:   Placement time:   Site:   Days:   Peripheral IV 05/28/19   05/28/19    0054    -   less than 1   Peripheral IV  05/28/19 Left Antecubital   05/28/19    0106    Antecubital   less than 1          Intake/Output Last 24 hours  Intake/Output Summary (Last 24 hours) at 05/28/2019 0617 Last data filed at 05/28/2019 0433 Gross per 24 hour  Intake -  Output 2200 ml  Net -2200 ml    Labs/Imaging Results for orders placed or performed during the hospital encounter of 05/28/19 (from the past 48 hour(s))  Basic metabolic panel     Status: Abnormal   Collection Time: 05/27/19 11:19 PM  Result Value Ref Range   Sodium 139 135 - 145 mmol/L   Potassium 3.8 3.5 - 5.1 mmol/L   Chloride 109 98 - 111 mmol/L   CO2 24 22 - 32 mmol/L   Glucose, Bld 94 70 - 99 mg/dL   BUN 19 6 - 20 mg/dL   Creatinine, Ser 1.17 0.61 - 1.24 mg/dL   Calcium 8.5 (L) 8.9 - 10.3 mg/dL   GFR calc non Af Amer >60 >60 mL/min   GFR calc Af Amer >60 >60 mL/min   Anion gap 6 5 - 15    Comment: Performed at [facility], [address] [place], [place] [address]  CBC     Status: Abnormal   Collection Time: 05/27/19 11:19 PM  Result Value Ref Range   WBC 6.7 4.0 - 10.5 K/uL   RBC 3.63 (L) 4.22 - 5.81 MIL/uL   Hemoglobin 11.6 (  L) 13.0 - 17.0 g/dL   HCT 37.1 (L) 39.0 - 52.0 %   MCV 102.2 (H) 80.0 - 100.0 fL   MCH 32.0 26.0 - 34.0 pg   MCHC 31.3 30.0 - 36.0 g/dL   RDW 14.0 11.5 - 15.5 %   Platelets 248 150 - 400 K/uL   nRBC 0.0 0.0 - 0.2 %    Comment: Performed at [facility], [address] [place] [address], [place] [address]  Troponin I (High Sensitivity)     Status: Abnormal   Collection Time: 05/27/19 11:19 PM  Result Value Ref Range   Troponin I (High Sensitivity) 20 (H) <18 ng/L    Comment: (NOTE) Elevated high sensitivity troponin I (hsTnI) values and significant  changes across serial measurements may suggest ACS but many other  chronic and acute conditions are known to elevate hsTnI results.  Refer to the "Links" section for chest pain algorithms and additional  guidance. Performed at [facility],  [address] [place], [place] [address]   Brain natriuretic peptide     Status: Abnormal   Collection Time: 05/27/19 11:19 PM  Result Value Ref Range   B Natriuretic Peptide 1,249.0 (H) 0.0 - 100.0 pg/mL    Comment: Performed at [facility], [address] [place], [place] [address]  Glucose, capillary     Status: Abnormal   Collection Time: 05/28/19 12:55 AM  Result Value Ref Range   Glucose-Capillary 46 (L) 70 - 99 mg/dL  Glucose, capillary     Status: Abnormal   Collection Time: 05/28/19  1:12 AM  Result Value Ref Range   Glucose-Capillary 105 (H) 70 - 99 mg/dL  Glucose, capillary     Status: None   Collection Time: 05/28/19  1:39 AM  Result Value Ref Range   Glucose-Capillary 97 70 - 99 mg/dL  Troponin I (High Sensitivity)     Status: Abnormal   Collection Time: 05/28/19  2:10 AM  Result Value Ref Range   Troponin I (High Sensitivity) 18 (H) <18 ng/L    Comment: (NOTE) Elevated high sensitivity troponin I (hsTnI) values and significant  changes across serial measurements may suggest ACS but many other  chronic and acute conditions are known to elevate hsTnI results.  Refer to the "Links" section for chest pain algorithms and additional  guidance. Performed at [facility], [address] [place], [place] [address]   Glucose, capillary     Status: Abnormal   Collection Time: 05/28/19  2:12 AM  Result Value Ref Range   Glucose-Capillary 110 (H) 70 - 99 mg/dL  Glucose, capillary     Status: Abnormal   Collection Time: 05/28/19  3:19 AM  Result Value Ref Range   Glucose-Capillary 63 (L) 70 - 99 mg/dL  Glucose, capillary     Status: Abnormal   Collection Time: 05/28/19  4:30 AM  Result Value Ref Range   Glucose-Capillary 121 (H) 70 - 99 mg/dL  Glucose, capillary     Status: Abnormal   Collection Time: 05/28/19  5:08 AM  Result Value Ref Range   Glucose-Capillary 154 (H) 70 - 99 mg/dL  Glucose, capillary     Status: Abnormal   Collection  Time: 05/28/19  6:02 AM  Result Value Ref Range   Glucose-Capillary 115 (H) 70 - 99 mg/dL   Dg Chest 1 View  Result Date: 05/28/2019 CLINICAL DATA:  Short of breath EXAM: CHEST  1 VIEW COMPARISON:  04/29/2019, 03/26/2019 FINDINGS: Cardiomegaly.  No acute consolidation  or effusion.  No pneumothorax. IMPRESSION: No active disease.  Cardiomegaly Electronically Signed   By: Donavan Foil M.D.   On: 05/28/2019 01:50    Pending Labs Unresulted Labs (From admission, onward)    Start     Ordered   06/04/19 0500  Creatinine, serum  (enoxaparin (LOVENOX)    CrCl >/= 30 ml/min)  Weekly,   STAT    Comments: while on enoxaparin therapy    05/28/19 0538   05/28/19 0539  CBC  (enoxaparin (LOVENOX)    CrCl >/= 30 ml/min)  Once,   STAT    Comments: Baseline for enoxaparin therapy IF NOT ALREADY DRAWN.  Notify MD if PLT < 100 K.    05/28/19 0538   05/28/19 1062  Basic metabolic panel  Tomorrow morning,   STAT     05/28/19 0538   05/28/19 0536  Troponin I (High Sensitivity)  STAT Now then every 2 hours,   STAT    Question Answer Comment  Indication Other   Specify indication chf      05/28/19 0536   05/28/19 0528  SARS Coronavirus 2 (CEPHEID - Performed in [facility] hospital lab), Hosp Order  (Asymptomatic Patients Labs)  Once,   STAT    Question:  Rule Out  Answer:  Yes   05/28/19 0527   05/28/19 0419  Hemoglobin A1c  Add-on,   AD     05/28/19 0418          Vitals/Pain Today's Vitals   05/28/19 0440 05/28/19 0441 05/28/19 0500 05/28/19 0604  BP: 102/67 102/67 106/70 124/74  Pulse: 74 74 63 68  Resp:  (!) 22  (!) 22  Temp:      TempSrc:      SpO2: 100% 99% 97% 99%  Weight:      Height:        Isolation Precautions No active isolations  Medications Medications  losartan (COZAAR) tablet 12.5 mg (has no administration in time range)  metoprolol succinate (TOPROL-XL) 24 hr tablet 12.5 mg (has no administration in time range)  potassium chloride SA (K-DUR) CR tablet 20 mEq (has no  administration in time range)  enoxaparin (LOVENOX) injection 40 mg (has no administration in time range)  sodium chloride flush (NS) 0.9 % injection 3 mL (has no administration in time range)  acetaminophen (TYLENOL) tablet 650 mg (has no administration in time range)    Or  acetaminophen (TYLENOL) suppository 650 mg (has no administration in time range)  ondansetron (ZOFRAN) tablet 4 mg (has no administration in time range)    Or  ondansetron (ZOFRAN) injection 4 mg (has no administration in time range)  furosemide (LASIX) injection 40 mg (has no administration in time range)  dextrose 50 % solution 50 mL (50 mLs Intravenous Given 05/28/19 0107)  furosemide (LASIX) injection 80 mg (80 mg Intravenous Given 05/28/19 0140)  dextrose 50 % solution 50 mL (50 mLs Intravenous Given 05/28/19 0348)    Mobility walks Low fall risk   Focused Assessments Cardiac Assessment Handoff:  Cardiac Rhythm: Normal sinus rhythm Lab Results  Component Value Date   TROPONINI <0.03 04/29/2019   No results found for: DDIMER Does the Patient currently have chest pain? No     R Recommendations: See Admitting Provider Note  Report given to:   Additional Notes:

## 2019-05-29 ENCOUNTER — Telehealth (HOSPITAL_COMMUNITY): Payer: Self-pay

## 2019-05-29 NOTE — Telephone Encounter (Signed)
Have attempted to contact several times, including at his work.  Left messages at his work to contact me and unable to get through his phone.  Will continue to contact.   [place] [phone]

## 2019-06-05 ENCOUNTER — Telehealth (HOSPITAL_COMMUNITY): Payer: Self-pay

## 2019-06-05 NOTE — Telephone Encounter (Signed)
Attempted to contact, left message for him to return my call.   [place] [phone]

## 2019-06-07 ENCOUNTER — Telehealth (HOSPITAL_COMMUNITY): Payer: Self-pay

## 2019-06-07 NOTE — Telephone Encounter (Signed)
Gary Bentley contacted me and advised he is out of all his medications except insulin.  He states he does not see a need to visit with HF clinic since he goes to cardiology at [facility].  I contacted [facility] clinic and advised he needs refills on potassium, toprol, losartan and lasix.  He states he just ran out, they advised will call them in to his pharmacy.  He has appt coming up with them in August.  He states feeling ok, been doing well.  He states cell phone was turned off but back on now.  He had stress test done 2 weeks ago with cardiology.  He denies chest pain, headaches, shortness of breath or dizziness.  He denies swelling.  Will continue to visit for heart failure.   [place] [phone]

## 2019-06-13 ENCOUNTER — Telehealth (HOSPITAL_COMMUNITY): Payer: Self-pay

## 2019-06-13 NOTE — Telephone Encounter (Signed)
Gary Bentley contacted me today and advised he is out of his insulin if I could get him some called in.  Contacted [facility] and they state he can buy this over the counter and cost is 24.88.  Gary Bentley and he states he will go at lunch today to purchase it.  He states he can afford it.  He states been feeling good, no swelling and has all his other medications.  Will continue to visit for Heart Failure and medication compliance.   [place] [phone]

## 2019-07-01 NOTE — Discharge Summary (Signed)
AGAINST MEDICAL ADVICE summary  Acute on chronic systolic CHF Diabetes type 2 Hypertension   Gary Bentley  is a 48 y.o. male with a known history of hypertension, diabetes mellitus and CHF with history of EF of 15%.  He presented to the emergency room complaining of increased shortness of breath and orthopnea.  He reported a 7 pound weight gain over the last 3 to 4 days.  He is taking Lasix 40 mg twice daily.  He reports excellent compliance with this as well.  He has noted some increased edema in his bilateral lower extremities.  Patient was being diuresed with Lasix for CHF in the evening of admission patient stated that he wanted to go home and decided to leave [place].  He was strongly recommended to stay in the hospital.  However patient insisted on going home therefore he left [place]

## 2019-07-07 ENCOUNTER — Other Ambulatory Visit (HOSPITAL_COMMUNITY): Payer: Self-pay

## 2019-07-07 NOTE — Progress Notes (Signed)
Today was a telephone visit with Gary Bentley.  He states doing ok, he states he keeps gaining weight.  He seen cardiology yesterday and they have switched his medications, he states the papers are in his car and he will call me back with those.  He states legs are swelling and they are switching his fluid pill.  He states they advised that his heart is still weak and putting him on meds for that.  He states feels ok, working everyday.  He is picking up his new prescriptions today.   He denies chest pain, headaches, dizziness or shortness of breath at rest.  Will continue to visit for heart failure and medication compliance.   [place] [phone]

## 2019-07-10 ENCOUNTER — Other Ambulatory Visit: Payer: Self-pay

## 2019-07-10 ENCOUNTER — Observation Stay
Admission: EM | Admit: 2019-07-10 | Discharge: 2019-07-12 | Disposition: A | Discharge status: Home / Self Care | Payer: 59 | Attending: Internal Medicine | Admitting: Internal Medicine

## 2019-07-10 ENCOUNTER — Emergency Department: Payer: 59

## 2019-07-10 DIAGNOSIS — I429 Cardiomyopathy, unspecified: Secondary | ICD-10-CM | POA: Insufficient documentation

## 2019-07-10 DIAGNOSIS — I110 Hypertensive heart disease with heart failure: Principal | ICD-10-CM | POA: Insufficient documentation

## 2019-07-10 DIAGNOSIS — D539 Nutritional anemia, unspecified: Secondary | ICD-10-CM | POA: Diagnosis not present

## 2019-07-10 DIAGNOSIS — Z20828 Contact with and (suspected) exposure to other viral communicable diseases: Secondary | ICD-10-CM | POA: Insufficient documentation

## 2019-07-10 DIAGNOSIS — Z794 Long term (current) use of insulin: Secondary | ICD-10-CM | POA: Diagnosis not present

## 2019-07-10 DIAGNOSIS — Z9119 Patient's noncompliance with other medical treatment and regimen: Secondary | ICD-10-CM | POA: Insufficient documentation

## 2019-07-10 DIAGNOSIS — Z87891 Personal history of nicotine dependence: Secondary | ICD-10-CM | POA: Insufficient documentation

## 2019-07-10 DIAGNOSIS — E119 Type 2 diabetes mellitus without complications: Secondary | ICD-10-CM | POA: Diagnosis not present

## 2019-07-10 DIAGNOSIS — Z79899 Other long term (current) drug therapy: Secondary | ICD-10-CM | POA: Insufficient documentation

## 2019-07-10 DIAGNOSIS — R0602 Shortness of breath: Secondary | ICD-10-CM | POA: Diagnosis present

## 2019-07-10 DIAGNOSIS — I5023 Acute on chronic systolic (congestive) heart failure: Secondary | ICD-10-CM | POA: Diagnosis present

## 2019-07-10 LAB — CBC WITH DIFFERENTIAL/PLATELET
Abs Immature Granulocytes: 0.02 10*3/uL (ref 0.00–0.07)
Basophils Absolute: 0 10*3/uL (ref 0.0–0.1)
Basophils Relative: 1 %
Eosinophils Absolute: 0.2 10*3/uL (ref 0.0–0.5)
Eosinophils Relative: 3 %
HCT: 41 % (ref 39.0–52.0)
Hemoglobin: 12.7 g/dL — ABNORMAL LOW (ref 13.0–17.0)
Immature Granulocytes: 0 %
Lymphocytes Relative: 31 %
Lymphs Abs: 1.8 10*3/uL (ref 0.7–4.0)
MCH: 32.6 pg (ref 26.0–34.0)
MCHC: 31 g/dL (ref 30.0–36.0)
MCV: 105.4 fL — ABNORMAL HIGH (ref 80.0–100.0)
Monocytes Absolute: 0.8 10*3/uL (ref 0.1–1.0)
Monocytes Relative: 14 %
Neutro Abs: 3.1 10*3/uL (ref 1.7–7.7)
Neutrophils Relative %: 51 %
Platelets: 252 10*3/uL (ref 150–400)
RBC: 3.89 MIL/uL — ABNORMAL LOW (ref 4.22–5.81)
RDW: 14.1 % (ref 11.5–15.5)
WBC: 5.9 10*3/uL (ref 4.0–10.5)
nRBC: 0 % (ref 0.0–0.2)

## 2019-07-10 LAB — BASIC METABOLIC PANEL
Anion gap: 4 — ABNORMAL LOW (ref 5–15)
BUN: 17 mg/dL (ref 6–20)
CO2: 22 mmol/L (ref 22–32)
Calcium: 8.7 mg/dL — ABNORMAL LOW (ref 8.9–10.3)
Chloride: 112 mmol/L — ABNORMAL HIGH (ref 98–111)
Creatinine, Ser: 1.03 mg/dL (ref 0.61–1.24)
GFR calc Af Amer: >60 mL/min (ref >60)
GFR calc non Af Amer: >60 mL/min (ref >60)
Glucose, Bld: 117 mg/dL — ABNORMAL HIGH (ref 70–99)
Potassium: 4.3 mmol/L (ref 3.5–5.1)
Sodium: 138 mmol/L (ref 135–145)

## 2019-07-10 LAB — GLUCOSE, CAPILLARY
Glucose-Capillary: 147 mg/dL — ABNORMAL HIGH (ref 70–99)
Glucose-Capillary: 123 mg/dL — ABNORMAL HIGH (ref 70–99)

## 2019-07-10 LAB — TROPONIN I (HIGH SENSITIVITY)
Troponin I (High Sensitivity): 14 ng/L (ref <18)
Troponin I (High Sensitivity): 11 ng/L (ref <18)

## 2019-07-10 LAB — BRAIN NATRIURETIC PEPTIDE: B Natriuretic Peptide: 968 pg/mL — ABNORMAL HIGH (ref 0.0–100.0)

## 2019-07-10 LAB — SARS CORONAVIRUS 2 BY RT PCR (HOSPITAL ORDER, PERFORMED IN ~~LOC~~ HOSPITAL LAB): SARS Coronavirus 2: NEGATIVE

## 2019-07-10 MED ORDER — SODIUM CHLORIDE 0.9 % IV SOLN: 250.0000 mL | INTRAVENOUS | Status: DC | PRN

## 2019-07-10 MED ORDER — SACUBITRIL-VALSARTAN 49-51 MG PO TABS
1.0000 | ORAL_TABLET | Freq: Two times a day (BID) | ORAL | Status: DC
  Administered 2019-07-10 – 2019-07-12 (×5): 1 via ORAL
  Filled 2019-07-10 (×6): qty 1

## 2019-07-10 MED ORDER — FUROSEMIDE 10 MG/ML IJ SOLN
40.0000 mg | Freq: Every day | INTRAMUSCULAR | Status: DC
  Administered 2019-07-11 – 2019-07-12 (×2): 40 mg via INTRAVENOUS
  Filled 2019-07-10 (×2): qty 4

## 2019-07-10 MED ORDER — INSULIN ASPART 100 UNIT/ML ~~LOC~~ SOLN
0.0000 [IU] | Freq: Three times a day (TID) | SUBCUTANEOUS | Status: DC
  Administered 2019-07-10: 1 [IU] via SUBCUTANEOUS
  Administered 2019-07-11: 2 [IU] via SUBCUTANEOUS
  Administered 2019-07-11: 1 [IU] via SUBCUTANEOUS
  Filled 2019-07-10 (×3): qty 1

## 2019-07-10 MED ORDER — HYDRALAZINE HCL 50 MG PO TABS
50.0000 mg | ORAL_TABLET | Freq: Three times a day (TID) | ORAL | Status: DC
  Administered 2019-07-10 – 2019-07-12 (×6): 50 mg via ORAL
  Filled 2019-07-10 (×6): qty 1

## 2019-07-10 MED ORDER — METOPROLOL SUCCINATE ER 25 MG PO TB24
12.5000 mg | ORAL_TABLET | Freq: Every day | ORAL | Status: DC
  Administered 2019-07-10 – 2019-07-12 (×3): 12.5 mg via ORAL
  Filled 2019-07-10 (×3): qty 1

## 2019-07-10 MED ORDER — ENOXAPARIN SODIUM 40 MG/0.4ML ~~LOC~~ SOLN
40.0000 mg | SUBCUTANEOUS | Status: DC
  Administered 2019-07-10 – 2019-07-11 (×2): 40 mg via SUBCUTANEOUS
  Filled 2019-07-10 (×2): qty 0.4

## 2019-07-10 MED ORDER — FUROSEMIDE 10 MG/ML IJ SOLN
40.0000 mg | Freq: Once | INTRAMUSCULAR | Status: AC
  Administered 2019-07-10: 10:00:00 40 mg via INTRAVENOUS
  Filled 2019-07-10: qty 4

## 2019-07-10 MED ORDER — ACETAMINOPHEN 325 MG PO TABS: 650.0000 mg | ORAL_TABLET | ORAL | Status: DC | PRN

## 2019-07-10 MED ORDER — INSULIN ASPART 100 UNIT/ML ~~LOC~~ SOLN: 0.0000 [IU] | Freq: Every day | SUBCUTANEOUS | Status: DC

## 2019-07-10 MED ORDER — SODIUM CHLORIDE 0.9% FLUSH
3.0000 mL | Freq: Two times a day (BID) | INTRAVENOUS | Status: DC
  Administered 2019-07-10 – 2019-07-12 (×4): 3 mL via INTRAVENOUS

## 2019-07-10 MED ORDER — ISOSORBIDE MONONITRATE ER 30 MG PO TB24
30.0000 mg | ORAL_TABLET | Freq: Every day | ORAL | Status: DC
  Administered 2019-07-10 – 2019-07-12 (×3): 30 mg via ORAL
  Filled 2019-07-10 (×3): qty 1

## 2019-07-10 MED ORDER — SODIUM CHLORIDE 0.9% FLUSH: 3.0000 mL | INTRAVENOUS | Status: DC | PRN

## 2019-07-10 MED ORDER — ONDANSETRON HCL 4 MG/2ML IJ SOLN: 4.0000 mg | Freq: Four times a day (QID) | INTRAMUSCULAR | Status: DC | PRN

## 2019-07-10 NOTE — H&P (Signed)
[facility] NAME: Gary Bentley    MR#:  [id number]  DATE OF BIRTH:  11/02/1972  DATE OF ADMISSION:  07/10/2019  PRIMARY CARE PHYSICIAN: Patient, No Pcp Per   REQUESTING/REFERRING PHYSICIAN: Charlotte Crumb, MD  CHIEF COMPLAINT:   Chief Complaint  Patient presents with   Shortness of Breath    HISTORY OF PRESENT ILLNESS:  Gary Bentley  is a 48 y.o. male with a known history of chronic systolic CHF and type 2 diabetes who presented to the ED with progressively worsening shortness of breath and lower extremity edema over the last week.  He also endorses orthopnea and dyspnea on exertion.  He has been having the same dry cough.  No fevers or chills.  He does endorse "chest tightness", but denies any chest pain.  He was seen in the cardiology office on 07/06/2019 with lower extremity edema and his Lasix dose was increased from 20 mg daily to 40 mg daily.  Patient states that he has not yet made this change.  In the ED, vitals were unremarkable.  Labs were significant for BNP 968, hemoglobin 12.7.  Chest x-ray showed some vascular congestion.  He was given Lasix IV x1.  Hospitalists were called for admission.  PAST MEDICAL HISTORY:   Past Medical History:  Diagnosis Date   CHF (congestive heart failure) (HCC)    Diabetes mellitus without complication ([place])     PAST SURGICAL HISTORY:   Past Surgical History:  Procedure Laterality Date   FOOT SURGERY     RIGHT/LEFT HEART CATH AND CORONARY ANGIOGRAPHY N/A 01/13/2019   Procedure: RIGHT/LEFT HEART CATH AND CORONARY ANGIOGRAPHY;  Surgeon: Minna Merritts, MD;  Location: [facility];  Service: Cardiovascular;  Laterality: N/A;    SOCIAL HISTORY:   Social History   Tobacco Use   Smoking status: Former Smoker    Packs/day: 1.00    Types: Cigarettes    Quit date: 04/17/2017    Years since quitting: 2.2   Smokeless tobacco: Never Used  Substance Use Topics    Alcohol use: No    FAMILY HISTORY:   Family History  Family history unknown: Yes    DRUG ALLERGIES:   Allergies  Allergen Reactions   Bee Venom Anaphylaxis    REVIEW OF SYSTEMS:   Review of Systems  Constitutional: Negative for chills and fever.  HENT: Negative for congestion and sore throat.   Eyes: Negative for blurred vision and double vision.  Respiratory: Positive for cough and shortness of breath. Negative for sputum production.   Cardiovascular: Positive for orthopnea and leg swelling. Negative for chest pain.  Gastrointestinal: Negative for nausea and vomiting.  Genitourinary: Negative for dysuria and urgency.  Musculoskeletal: Negative for back pain and neck pain.  Neurological: Negative for dizziness and headaches.  Psychiatric/Behavioral: Negative for depression. The patient is not nervous/anxious.     MEDICATIONS AT HOME:   Prior to Admission medications   Medication Sig Start Date End Date Taking? Authorizing Provider  furosemide (LASIX) 20 MG tablet Take 2 tablets (40 mg total) by mouth 2 (two) times daily. 04/30/19   Rudene Re, MD  insulin aspart protamine - aspart (NOVOLOG MIX 70/30 FLEXPEN) (70-30) 100 UNIT/ML FlexPen Inject 0.1 mLs (10 Units total) into the skin 2 (two) times daily. 01/15/19   Dustin Flock, MD  losartan (COZAAR) 25 MG tablet Take 0.5 tablets (12.5 mg total) by mouth daily. 03/30/19 07/07/19  Henreitta Leber, MD  metoprolol succinate (TOPROL-XL) 25 MG 24 hr tablet Take 0.5 tablets (12.5 mg total) by mouth daily. 03/30/19 07/07/19  Henreitta Leber, MD  potassium chloride 20 MEQ TBCR Take 20 mEq by mouth daily. 03/29/19 07/07/19  Henreitta Leber, MD      VITAL SIGNS:  Blood pressure (!) 141/102, pulse 76, temperature (!) 97.5 F (36.4 C), temperature source Oral, resp. rate 19, height 6\' 5"  (1.956 m), weight 117.9 kg, SpO2 99 %.  PHYSICAL EXAMINATION:  Physical Exam  GENERAL:  48 y.o.-year-old patient lying in the bed with no acute  distress.  EYES: Pupils equal, round, reactive to light and accommodation. No scleral icterus. Extraocular muscles intact.  HEENT: Head atraumatic, normocephalic. Oropharynx and nasopharynx clear.  NECK:  Supple, no jugular venous distention. No thyroid enlargement, no tenderness.  LUNGS: + Faint bibasilar crackles present.  Able to speak in full sentences.  No use of accessory muscles of respiration.  CARDIOVASCULAR: RRR, S1, S2 normal. No murmurs, rubs, or gallops.  ABDOMEN: Soft, nontender, nondistended. Bowel sounds present. No organomegaly or mass.  EXTREMITIES: No cyanosis, or clubbing. 1+ pitting edema bilaterally. NEUROLOGIC: Cranial nerves II through XII are intact. Muscle strength 5/5 in all extremities. Sensation intact. Gait not checked.  PSYCHIATRIC: The patient is alert and oriented x 3.  SKIN: No obvious rash. + Dry skin and some superficial ulcers present on the lower extremities.  LABORATORY PANEL:   CBC Recent Labs  Lab 07/10/19 0802  WBC 5.9  HGB 12.7*  HCT 41.0  PLT 252   ------------------------------------------------------------------------------------------------------------------  Chemistries  Recent Labs  Lab 07/10/19 0802  NA 138  K 4.3  CL 112*  CO2 22  GLUCOSE 117*  BUN 17  CREATININE 1.03  CALCIUM 8.7*   ------------------------------------------------------------------------------------------------------------------  Cardiac Enzymes No results for input(s): TROPONINI in the last 168 hours. ------------------------------------------------------------------------------------------------------------------  RADIOLOGY:  Dg Chest Port 1 View  Result Date: 07/10/2019 CLINICAL DATA:  Increasing shortness of breath and leg swelling EXAM: PORTABLE CHEST 1 VIEW COMPARISON:  05/28/2019 FINDINGS: Cardiac shadow is again mildly enlarged but accentuated by the portable technique. Mild central vascular congestion is noted increased from the prior exam  although no interstitial edema is seen. No focal infiltrate is noted. No bony abnormality is seen. IMPRESSION: Mild increase in vascular congestion. Electronically Signed   By: Inez Catalina M.D.   On: 07/10/2019 08:35      IMPRESSION AND PLAN:   Acute on chronic systolic congestive heart failure- last ECHO 03/29/2019 with EF <15%, severe biventricular systolic dysfunction, and multiple LV apical thrombi. Recent cath 01/13/19 without significant CAD. Follows with Dr. Clayborn Bigness as an outpatient. -Continue IV Lasix 40 mg daily -Cardiology consult -Strict I/O, daily weights -Continue home entresto, metoprolol, hydralazine, and imdur -Check lipid panel  ?Multiple LV apical thrombi- seen on last ECHO, however per previous cardiology notes, this is felt to be unlikely due to a lack of apical hypokinesis. Patient is not currently on anticoagulation. -Defer decision for anticoagulation to cardiology  Hypertension- BP mildly elevated in the ED -Continue home entresto, metoprolol, hydralazine, imdur  Type 2 diabetes -Sensitive SSI  Macrocytic anemia- hemoglobin at baseline. -Check folate and vitamin B12  All the records are reviewed and case discussed with ED provider. Management plans discussed with the patient, family and they are in agreement.  CODE STATUS: Full  TOTAL TIME TAKING CARE OF THIS PATIENT: 45 minutes.    Berna Spare Arville Postlewaite M.D on 07/10/2019 at 10:37 AM  Between 7am to 6pm -  Pager - [phone]  After 6pm go to [email or web address] - Proofreader  [facility]  Office  [phone]  CC: Primary care physician; Patient, No Pcp Per   Note: This dictation was prepared with Dragon dictation along with smaller phrase technology. Any transcriptional errors that result from this process are unintentional.

## 2019-07-10 NOTE — ED Notes (Signed)
ED TO INPATIENT HANDOFF REPORT  ED Nurse Name and Phone #: Helene Kelp [phone]  S Name/Age/Gender Gary Bentley 48 y.o. male Room/Bed: ED06A/ED06A  Code Status   Code Status: Prior  Home/SNF/Other Home Patient oriented to: self, place, time and situation Is this baseline? Yes   Triage Complete: Triage complete  Chief Complaint sob  Triage Note Pt c/o increased SOB with leg swelling and intermittent chest pain for the past week. States he has a hx of CHF and feels like it is flaring up.   Allergies Allergies  Allergen Reactions  . Bee Venom Anaphylaxis    Level of Care/Admitting Diagnosis ED Disposition    ED Disposition Condition [facility] Area: [place] [[id number]]  Level of Care: Telemetry [5]  Covid Evaluation: Asymptomatic Screening Protocol (No Symptoms)  Diagnosis: Acute on chronic systolic CHF (congestive heart failure) [facility]) [[id number]]  Admitting Physician: Hyman Bible DODD [[id number]]  Attending Physician: Hyman Bible DODD [[id number]]  Estimated length of stay: past midnight tomorrow  Certification:: I certify this patient will need inpatient services for at least 2 midnights  PT Class (Do Not Modify): Inpatient [101]  PT Acc Code (Do Not Modify): Private [1]       B Medical/Surgery History Past Medical History:  Diagnosis Date  . CHF (congestive heart failure) ([place])   . Diabetes mellitus without complication [facility])    Past Surgical History:  Procedure Laterality Date  . FOOT SURGERY    . RIGHT/LEFT HEART CATH AND CORONARY ANGIOGRAPHY N/A 01/13/2019   Procedure: RIGHT/LEFT HEART CATH AND CORONARY ANGIOGRAPHY;  Surgeon: Minna Merritts, MD;  Location: [facility];  Service: Cardiovascular;  Laterality: N/A;     A IV Location/Drains/Wounds Patient Lines/Drains/Airways Status   Active Line/Drains/Airways    Name:   Placement date:   Placement time:   Site:   Days:   Peripheral IV 07/10/19 Left Antecubital    07/10/19    0803    Antecubital   less than 1          Intake/Output Last 24 hours No intake or output data in the 24 hours ending 07/10/19 1206  Labs/Imaging Results for orders placed or performed during the hospital encounter of 07/10/19 (from the past 48 hour(s))  CBC with Differential     Status: Abnormal   Collection Time: 07/10/19  8:02 AM  Result Value Ref Range   WBC 5.9 4.0 - 10.5 K/uL   RBC 3.89 (L) 4.22 - 5.81 MIL/uL   Hemoglobin 12.7 (L) 13.0 - 17.0 g/dL   HCT 41.0 39.0 - 52.0 %   MCV 105.4 (H) 80.0 - 100.0 fL   MCH 32.6 26.0 - 34.0 pg   MCHC 31.0 30.0 - 36.0 g/dL   RDW 14.1 11.5 - 15.5 %   Platelets 252 150 - 400 K/uL   nRBC 0.0 0.0 - 0.2 %   Neutrophils Relative % 51 %   Neutro Abs 3.1 1.7 - 7.7 K/uL   Lymphocytes Relative 31 %   Lymphs Abs 1.8 0.7 - 4.0 K/uL   Monocytes Relative 14 %   Monocytes Absolute 0.8 0.1 - 1.0 K/uL   Eosinophils Relative 3 %   Eosinophils Absolute 0.2 0.0 - 0.5 K/uL   Basophils Relative 1 %   Basophils Absolute 0.0 0.0 - 0.1 K/uL   Immature Granulocytes 0 %   Abs Immature Granulocytes 0.02 0.00 - 0.07 K/uL    Comment: Performed at [facility], [address] [place], [place] [address]  Troponin I (High Sensitivity)     Status: None   Collection Time: 07/10/19  8:02 AM  Result Value Ref Range   Troponin I (High Sensitivity) 14 <18 ng/L    Comment: (NOTE) Elevated high sensitivity troponin I (hsTnI) values and significant  changes across serial measurements may suggest ACS but many other  chronic and acute conditions are known to elevate hsTnI results.  Refer to the "Links" section for chest pain algorithms and additional  guidance. Performed at [facility], [address] [place], [place] [address]   Brain natriuretic peptide     Status: Abnormal   Collection Time: 07/10/19  8:02 AM  Result Value Ref Range   B Natriuretic Peptide 968.0 (H) 0.0 - 100.0 pg/mL    Comment: Performed at [facility], [address] [place], [place] [address]  Basic metabolic panel     Status: Abnormal   Collection Time: 07/10/19  8:02 AM  Result Value Ref Range   Sodium 138 135 - 145 mmol/L   Potassium 4.3 3.5 - 5.1 mmol/L   Chloride 112 (H) 98 - 111 mmol/L   CO2 22 22 - 32 mmol/L   Glucose, Bld 117 (H) 70 - 99 mg/dL   BUN 17 6 - 20 mg/dL   Creatinine, Ser 1.03 0.61 - 1.24 mg/dL   Calcium 8.7 (L) 8.9 - 10.3 mg/dL   GFR calc non Af Amer >60 >60 mL/min   GFR calc Af Amer >60 >60 mL/min   Anion gap 4 (L) 5 - 15    Comment: Performed at [facility], [place], [place] [address]  Troponin I (High Sensitivity)     Status: None   Collection Time: 07/10/19 10:25 AM  Result Value Ref Range   Troponin I (High Sensitivity) 11 <18 ng/L    Comment: (NOTE) Elevated high sensitivity troponin I (hsTnI) values and significant  changes across serial measurements may suggest ACS but many other  chronic and acute conditions are known to elevate hsTnI results.  Refer to the "Links" section for chest pain algorithms and additional  guidance. Performed at [facility], [address] [place], [place] [address]   SARS Coronavirus 2 [facility] order, Performed in [facility] hospital lab) Nasopharyngeal Nasopharyngeal Swab     Status: None   Collection Time: 07/10/19 10:29 AM   Specimen: Nasopharyngeal Swab  Result Value Ref Range   SARS Coronavirus 2 NEGATIVE NEGATIVE    Comment: (NOTE) If result is NEGATIVE SARS-CoV-2 target nucleic acids are NOT DETECTED. The SARS-CoV-2 RNA is generally detectable in upper and lower  respiratory specimens during the acute phase of infection. The lowest  concentration of SARS-CoV-2 viral copies this assay can detect is 250  copies / mL. A negative result does not preclude SARS-CoV-2 infection  and should not be used as the sole basis for treatment or other  patient management decisions.  A negative result may occur with  improper  specimen collection / handling, submission of specimen other  than nasopharyngeal swab, presence of viral mutation(s) within the  areas targeted by this assay, and inadequate number of viral copies  (<250 copies / mL). A negative result must be combined with clinical  observations, patient history, and epidemiological information. If result is POSITIVE SARS-CoV-2 target nucleic acids are DETECTED. The SARS-CoV-2 RNA is generally detectable in upper and lower  respiratory specimens dur ing the acute phase of infection.  Positive  results are indicative of active infection with SARS-CoV-2.  Clinical  correlation with patient history and other diagnostic information is  necessary to determine patient infection status.  Positive results do  not rule out bacterial infection or co-infection with other viruses. If result is PRESUMPTIVE POSTIVE SARS-CoV-2 nucleic acids MAY BE PRESENT.   A presumptive positive result was obtained on the submitted specimen  and confirmed on repeat testing.  While 2019 novel coronavirus  (SARS-CoV-2) nucleic acids may be present in the submitted sample  additional confirmatory testing may be necessary for epidemiological  and / or clinical management purposes  to differentiate between  SARS-CoV-2 and other Sarbecovirus currently known to infect humans.  If clinically indicated additional testing with an alternate test  methodology [phone]) is advised. The SARS-CoV-2 RNA is generally  detectable in upper and lower respiratory sp ecimens during the acute  phase of infection. The expected result is Negative. Fact Sheet for Patients:  StrictlyIdeas.no Fact Sheet for Healthcare Providers: BankingDealers.co.za This test is not yet approved or cleared by the [place] FDA and has been authorized for detection and/or diagnosis of SARS-CoV-2 by FDA under an Emergency Use Authorization (EUA).  This EUA will remain in  effect (meaning this test can be used) for the duration of the COVID-19 declaration under Section 564(b)(1) of the Act, 21 U.S.C. section 360bbb-3(b)(1), unless the authorization is terminated or revoked sooner. Performed at [facility], [address] [place], [place] [address]    Dg Chest [place] 1 View  Result Date: 07/10/2019 CLINICAL DATA:  Increasing shortness of breath and leg swelling EXAM: PORTABLE CHEST 1 VIEW COMPARISON:  05/28/2019 FINDINGS: Cardiac shadow is again mildly enlarged but accentuated by the portable technique. Mild central vascular congestion is noted increased from the prior exam although no interstitial edema is seen. No focal infiltrate is noted. No bony abnormality is seen. IMPRESSION: Mild increase in vascular congestion. Electronically Signed   By: Inez Catalina M.D.   On: 07/10/2019 08:35    Pending Labs [employer] (From admission, onward)    Start     Ordered   Signed and Corporate treasurer  Daily,   R     Signed and Held   Signed and Held  CBC WITH DIFFERENTIAL  Tomorrow morning,   R     Signed and Held   Signed and Held  Folate  Tomorrow morning,   R     Signed and Held   Signed and Held  Vitamin B12  Tomorrow morning,   R     Signed and Held   Signed and Held  Lipid panel  Tomorrow morning,   R     Signed and Held          Vitals/Pain Today's Vitals   07/10/19 0726 07/10/19 0728 07/10/19 0808 07/10/19 0900  BP: (!) 128/91  (!) 131/96 (!) 141/102  Pulse: 85  73 76  Resp: 18  14 19   Temp: (!) 97.5 F (36.4 C)     TempSrc: Oral     SpO2: 100%  96% 99%  Weight:  117.9 kg    Height:  6\' 5"  (1.956 m)    PainSc:  0-No pain      Isolation Precautions No active isolations  Medications Medications  furosemide (LASIX) injection 40 mg (40 mg Intravenous Given 07/10/19 1028)    Mobility walks Low fall risk   Focused Assessments see previous assessment   R Recommendations: See Admitting Provider Note  Report  given to:   Additional Notes:

## 2019-07-10 NOTE — ED Notes (Signed)
Pt reports that he was hospitalized 1-2 months ago for CHF - he states that last week he started with swelling to lower ext - noted at this time to had +2 pitting edema in bilat ext - appears in NAD at this time but reports [facility] that is worse on exertion - denies chest pain, dizziness, cough

## 2019-07-10 NOTE — ED Triage Notes (Signed)
Pt c/o increased SOB with leg swelling and intermittent chest pain for the past week. States he has a hx of CHF and feels like it is flaring up.

## 2019-07-10 NOTE — ED Provider Notes (Signed)
[facility] Emergency Department Provider Note  ____________________________________________   I have reviewed the triage vital signs and the nursing notes. Where available I have reviewed prior notes and, if possible and indicated, outside hospital notes.   Patient seen and evaluated during the coronavirus epidemic during a time with low staffing  Patient seen for the symptoms described in the history of present illness. She was evaluated in the context of the global COVID-19 pandemic, which necessitated consideration that the patient might be at risk for infection with the SARS-CoV-2 virus that causes COVID-19. Institutional protocols and algorithms that pertain to the evaluation of patients at risk for COVID-19 are in a state of rapid change based on information released by regulatory bodies including the CDC and federal and state organizations. These policies and algorithms were followed during the patient's care in the ED.    HISTORY  Chief Complaint Shortness of Breath    HPI Gary Bentley is a 48 y.o. male with a history of CHF, last echo that we have is from February 2018 which showed EF of 15 to 20%.  Patient is on Lasix every day which he states he is taking however he went to see his cardiologist because he is feeling short of breath and orthopneic without chest pain over the last week.  His cardiologist, Dr. Clayborn Bigness, recommended that he start a different medication but he has not yet begun to take that medication.  He states he is still taking his Lasix.  He states he cannot get around the house without getting too winded, he is also getting orthopnea when he lies flat.  He denies any significant leg swelling or leg states he often does not have them.  He does not have any fever chills nausea or vomiting.  He does not have chest pain, he does sometimes get a cough when he has CHF he has that kind of cough he feels nonproductive     Past Medical History:   Diagnosis Date  . CHF (congestive heart failure) ([place])   . Diabetes mellitus without complication [facility])     Patient Active Problem List   Diagnosis Date Noted  . Hypoglycemia 05/28/2019  . Acute exacerbation of CHF (congestive heart failure) ([place]) 05/28/2019  . Acute on chronic systolic CHF (congestive heart failure) ([place]) 03/26/2019  . Elevated troponin   . Peripheral edema   . Shortness of breath   . Influenza B 01/09/2019  . Cellulitis 03/22/2018  . Osteomyelitis ([place]) 09/04/2017  . Diabetes mellitus due to underlying condition with foot ulcer (CODE) ([place]) 09/04/2017  . Hyperglycemia 09/04/2017  . Foot pain, bilateral 09/04/2017    Past Surgical History:  Procedure Laterality Date  . FOOT SURGERY    . RIGHT/LEFT HEART CATH AND CORONARY ANGIOGRAPHY N/A 01/13/2019   Procedure: RIGHT/LEFT HEART CATH AND CORONARY ANGIOGRAPHY;  Surgeon: Minna Merritts, MD;  Location: [facility];  Service: Cardiovascular;  Laterality: N/A;    Prior to Admission medications   Medication Sig Start Date End Date Taking? Authorizing Provider  furosemide (LASIX) 20 MG tablet Take 2 tablets (40 mg total) by mouth 2 (two) times daily. 04/30/19   Rudene Re, MD  insulin aspart protamine - aspart (NOVOLOG MIX 70/30 FLEXPEN) (70-30) 100 UNIT/ML FlexPen Inject 0.1 mLs (10 Units total) into the skin 2 (two) times daily. 01/15/19   Dustin Flock, MD  losartan (COZAAR) 25 MG tablet Take 0.5 tablets (12.5 mg total) by mouth daily. 03/30/19 07/07/19  Henreitta Leber,  MD  metoprolol succinate (TOPROL-XL) 25 MG 24 hr tablet Take 0.5 tablets (12.5 mg total) by mouth daily. 03/30/19 07/07/19  Henreitta Leber, MD  potassium chloride 20 MEQ TBCR Take 20 mEq by mouth daily. 03/29/19 07/07/19  Henreitta Leber, MD    Allergies Bee venom  Family History  Family history unknown: Yes    Social History Social History   Tobacco Use  . Smoking status: Former Smoker    Packs/day: 1.00    Types:  Cigarettes    Quit date: 04/17/2017    Years since quitting: 2.2  . Smokeless tobacco: Never Used  Substance Use Topics  . Alcohol use: No  . Drug use: No    Review of Systems Constitutional: No fever/chills Eyes: No visual changes. ENT: No sore throat. No stiff neck no neck pain Cardiovascular: Denies chest pain. Respiratory: + shortness of breath. Gastrointestinal:   no vomiting.  No diarrhea.  No constipation. Genitourinary: Negative for dysuria. Musculoskeletal: Mild lower extremity swelling Skin: Negative for rash. Neurological: Negative for severe headaches, focal weakness or numbness.   ____________________________________________   PHYSICAL EXAM:  VITAL SIGNS: ED Triage Vitals  Enc Vitals Group     BP 07/10/19 0726 (!) 128/91     Pulse Rate 07/10/19 0726 85     Resp 07/10/19 0726 18     Temp 07/10/19 0726 (!) 97.5 F (36.4 C)     Temp Source 07/10/19 0726 Oral     SpO2 07/10/19 0726 100 %     Weight 07/10/19 0728 260 lb (117.9 kg)     Height 07/10/19 0728 6\' 5"  (1.956 m)     Head Circumference --      Peak Flow --      Pain Score 07/10/19 0728 0     Pain Loc --      Pain Edu? --      Excl. in [place]? --     Constitutional: Alert and oriented. Well appearing and in no acute distress. Eyes: Conjunctivae are normal Head: Atraumatic HEENT: No congestion/rhinnorhea. Mucous membranes are moist.  Oropharynx non-erythematous Neck:   Nontender with no meningismus, no masses, no stridor Cardiovascular: Normal rate, regular rhythm. Grossly normal heart sounds.  Good peripheral circulation. Respiratory: Normal respiratory effort.  No retractions.  Diminished in the bases Abdominal: Soft and nontender. No distention. No guarding no rebound Back:  There is no focal tenderness or step off.  there is no midline tenderness there are no lesions noted. there is no CVA tenderness Musculoskeletal: No lower extremity tenderness, no upper extremity tenderness. No joint effusions,  no DVT signs strong distal pulses mild chronic appearing bilateral symmetric edema Neurologic:  Normal speech and language. No gross focal neurologic deficits are appreciated.  Skin:  Skin is warm, dry and intact. No rash noted. Psychiatric: Mood and affect are normal. Speech and behavior are normal.  ____________________________________________   LABS (all labs ordered are listed, but only abnormal results are displayed)  Labs Reviewed  CBC WITH DIFFERENTIAL/PLATELET - Abnormal; Notable for the following components:      Result Value   RBC 3.89 (*)    Hemoglobin 12.7 (*)    MCV 105.4 (*)    All other components within normal limits  BRAIN NATRIURETIC PEPTIDE  BASIC METABOLIC PANEL  TROPONIN I (HIGH SENSITIVITY)    Pertinent labs  results that were available during my care of the patient were reviewed by me and considered in my medical decision making (see chart for details).  ____________________________________________  EKG  I personally interpreted any EKGs ordered by me or triage Sinus rate 81, QTc 501, LAD, intraventricular conduction delay, LAFB noted.  No acute ST elevation or depression, sinus ____________________________________________  RADIOLOGY  Pertinent labs & imaging results that were available during my care of the patient were reviewed by me and considered in my medical decision making (see chart for details). If possible, patient and/or family made aware of any abnormal findings.  No results found. ____________________________________________    PROCEDURES  Procedure(s) performed: None  Procedures  Critical Care performed: None  ____________________________________________   INITIAL IMPRESSION / ASSESSMENT AND PLAN / ED COURSE  Pertinent labs & imaging results that were available during my care of the patient were reviewed by me and considered in my medical decision making (see chart for details).   Patient here complaining of increasing  orthopnea and dyspnea on exertion with a history of significant CHF.  Oxygen saturation in the mid 90s at this time.  He does not require intubation or BiPAP.  Did recently see his cardiologist for this.  Is not yet begun the medication changes suggested there. Patient's findings have are consistent with exacerbated CHF however given his extremely low EF this is always of concern.  States is getting worse over the last week, and feels unsafe going home.   ____________________________________________   FINAL CLINICAL IMPRESSION(S) / ED DIAGNOSES  Final diagnoses:  SOB (shortness of breath)      This chart was dictated using voice recognition software.  Despite best efforts to proofread,  errors can occur which can change meaning.      Schuyler Amor, MD 07/10/19 [id number]

## 2019-07-11 LAB — CBC WITH DIFFERENTIAL/PLATELET
Abs Immature Granulocytes: 0.01 10*3/uL (ref 0.00–0.07)
Basophils Absolute: 0.1 10*3/uL (ref 0.0–0.1)
Basophils Relative: 1 %
Eosinophils Absolute: 0.2 10*3/uL (ref 0.0–0.5)
Eosinophils Relative: 4 %
HCT: 36.8 % — ABNORMAL LOW (ref 39.0–52.0)
Hemoglobin: 11.6 g/dL — ABNORMAL LOW (ref 13.0–17.0)
Immature Granulocytes: 0 %
Lymphocytes Relative: 33 %
Lymphs Abs: 1.9 10*3/uL (ref 0.7–4.0)
MCH: 32.4 pg (ref 26.0–34.0)
MCHC: 31.5 g/dL (ref 30.0–36.0)
MCV: 102.8 fL — ABNORMAL HIGH (ref 80.0–100.0)
Monocytes Absolute: 0.8 10*3/uL (ref 0.1–1.0)
Monocytes Relative: 15 %
Neutro Abs: 2.6 10*3/uL (ref 1.7–7.7)
Neutrophils Relative %: 47 %
Platelets: 254 10*3/uL (ref 150–400)
RBC: 3.58 MIL/uL — ABNORMAL LOW (ref 4.22–5.81)
RDW: 13.8 % (ref 11.5–15.5)
WBC: 5.6 10*3/uL (ref 4.0–10.5)
nRBC: 0 % (ref 0.0–0.2)

## 2019-07-11 LAB — BASIC METABOLIC PANEL
Anion gap: 3 — ABNORMAL LOW (ref 5–15)
BUN: 19 mg/dL (ref 6–20)
CO2: 24 mmol/L (ref 22–32)
Calcium: 8.2 mg/dL — ABNORMAL LOW (ref 8.9–10.3)
Chloride: 110 mmol/L (ref 98–111)
Creatinine, Ser: 1.08 mg/dL (ref 0.61–1.24)
GFR calc Af Amer: >60 mL/min (ref >60)
GFR calc non Af Amer: >60 mL/min (ref >60)
Glucose, Bld: 128 mg/dL — ABNORMAL HIGH (ref 70–99)
Potassium: 3.6 mmol/L (ref 3.5–5.1)
Sodium: 137 mmol/L (ref 135–145)

## 2019-07-11 LAB — GLUCOSE, CAPILLARY
Glucose-Capillary: 111 mg/dL — ABNORMAL HIGH (ref 70–99)
Glucose-Capillary: 156 mg/dL — ABNORMAL HIGH (ref 70–99)
Glucose-Capillary: 150 mg/dL — ABNORMAL HIGH (ref 70–99)
Glucose-Capillary: 115 mg/dL — ABNORMAL HIGH (ref 70–99)

## 2019-07-11 LAB — VITAMIN B12: Vitamin B-12: 276 pg/mL (ref 180–914)

## 2019-07-11 LAB — FOLATE: Folate: 9.6 ng/mL (ref >5.9)

## 2019-07-11 LAB — LIPID PANEL
Cholesterol: 105 mg/dL (ref 0–200)
HDL: 31 mg/dL — ABNORMAL LOW (ref >40)
LDL Cholesterol: 64 mg/dL (ref 0–99)
Total CHOL/HDL Ratio: 3.4 RATIO
Triglycerides: 52 mg/dL (ref <150)
VLDL: 10 mg/dL (ref 0–40)

## 2019-07-11 MED ORDER — POTASSIUM CHLORIDE CRYS ER 20 MEQ PO TBCR
20.0000 meq | EXTENDED_RELEASE_TABLET | Freq: Once | ORAL | Status: AC
  Administered 2019-07-11: 20 meq via ORAL
  Filled 2019-07-11: qty 1

## 2019-07-11 NOTE — Progress Notes (Signed)
Educated patient on heart failure. Offered CHF videos to patient but he refused stating he has watched them before. States he does have a scale at home but does not weigh himself every day. Stressed the importance of that and the importance of medication compliance as well as diet.

## 2019-07-11 NOTE — Progress Notes (Signed)
Spoke with the patient about the HF clinic. Patient has been seen by [place] in the past and an appointment was made with him for 07/18/2019.

## 2019-07-11 NOTE — Progress Notes (Addendum)
Cardiovascular and Pulmonary Nurse Navigator Note:   48 year old male with known hx of chronic systolic CHF and DM type II who presented tot he ED on 07/10/2019 with worsening SOB and lower extremity edema over the last week.  Patient was seen by his cardiologist on 07/06/2019 with lower extremity edema and his lasix dose was increased to 40 mg daily at that time.  Apparently, the patient did not make this change.  BNP was 968 and CXR revealed vascular congestion.    CHF Education / Review:   Educational session with patient completed. Heart Failure is not a new diagnosis for this patient.  Patient is followed in the [facility] as well as the [place] HF Paramedicine program.  Discussed EF value, his EF and normal EF.  Patient's EF from previous echo in February 2020 was 15% - 20.  Patient refused "Living Better with Heart Failure" booklet.  Patient stated, "I have that booklet at home."   ? *Heart Failure Zone magnet given and reviewed with patient. *Reviewed importance of and reason behind checking weight daily in the AM, after using the bathroom, but before getting dressed. Patient has functioning scale, but does not weigh daily.  Encouraged patient to weigh daily to look for changes in weight, which is an indication of his fluid status and a gauge to guide him to reach out for help sooner.    *Reviewed with patient the following information: *Discussed when to call the Dr= weight gain of >2-3lb overnight of 5lb in a week, *Discussed yellow zone= call MD: weight gain of >2-3lb overnight of 5lb in a week, increased swelling, increased SOB when lying down, chest discomfort, dizziness, increased fatigue *Red Zone= call 911: struggle to breath, fainting or near fainting, significant chest pain  Patient refused to watch the HF videos, as patient stated,  "I have seen them all before."    *Diet - Reviewed low sodium diet - Patient is reporting that he does not eat fast food.  Patient requested more  information on low sodium diet.  Dietitian Consultation has been ordered and is still pending.   Handout on Sodium Content of Foods given and reviewed with patient.    *Discussed fluid intake with patient as well. Patient not currently on a fluid restriction, but advised no more than 64 ounces of fluid per day.?  *Instructed patient to take medications as prescribed for heart failure. Explained briefly why pt is on the medications (either make you feel better, live longer or keep you out of the hospital) and discussed monitoring and side effects. CHF Patient Education Pharmacy Monitoring referral placed.    *Discussed exercise. Patient informed this RN that he walks a lot on the job.  Patient is a [employer].  Reminded patient he had been referred to the Cardiac Rehab program at [facility] back in February.  Cardiac Rehab dept has now re-opened and has attempted to reach the patient.  Patient is declining to participate in Cardiac Rehab at this time due to his work schedule.  Referral closed.    *Smoking Cessation- Patient is a FORMER smoker.    *[place] [facility] - Patient is an established patient in the [facility].  Patient's next appt in the [facility] HF Clinic July 18, 2019 at 11:30 a.m.    Patient is also followed by the [place] HF Paramedicine program.  Nile Dear, EMT conducted a virtual visit with this patient on 07/07/2019.    Again, the 5 Steps  to Living Better with Heart Failure were reviewed with patient.  Patient thanked me for providing the above information.   Roanna Epley, RN, BSN, [place] [facility]  Cardiovascular & Pulmonary Nurse Navigator  Direct Line: [phone]  Department Phone #: [phone] Fax: [phone]  Email Address: [email or web address]    ?

## 2019-07-11 NOTE — Progress Notes (Signed)
Gary Bentley at [place] NAME: Gary Bentley    MR#:  [id number]  DATE OF BIRTH:  11/02/1972  SUBJECTIVE: Patient is admitted for shortness of breath, acute on chronic systolic heart failure, on IV diuretics.  CHIEF COMPLAINT:   Chief Complaint  Patient presents with  . Shortness of Breath  Says he is feeling better.  REVIEW OF SYSTEMS:   ROS CONSTITUTIONAL: No fever, fatigue or weakness.  EYES: No blurred or double vision.  EARS, NOSE, AND THROAT: No tinnitus or ear pain.  RESPIRATORY: No cough, shortness of breath, wheezing or hemoptysis.  CARDIOVASCULAR: No chest pain, orthopnea, edema.  GASTROINTESTINAL: No nausea, vomiting, diarrhea or abdominal pain.  GENITOURINARY: No dysuria, hematuria.  ENDOCRINE: No polyuria, nocturia,  HEMATOLOGY: No anemia, easy bruising or bleeding SKIN: No rash or lesion. MUSCULOSKELETAL: No joint pain or arthritis.   NEUROLOGIC: No tingling, numbness, weakness.  PSYCHIATRY: No anxiety or depression.   DRUG ALLERGIES:   Allergies  Allergen Reactions  . Bee Venom Anaphylaxis    VITALS:  Blood pressure (!) 108/91, pulse 73, temperature 97.7 F (36.5 C), temperature source Oral, resp. rate 18, height 6\' 5"  (1.956 m), weight 117.4 kg, SpO2 99 %.  PHYSICAL EXAMINATION:  GENERAL:  48 y.o.-year-old patient lying in the bed with no acute distress.  EYES: Pupils equal, round, reactive to light . No scleral icterus. Extraocular muscles intact.  HEENT: Head atraumatic, normocephalic. Oropharynx and nasopharynx clear.  NECK:  Supple, no jugular venous distention. No thyroid enlargement, no tenderness.  LUNGS: Normal breath sounds bilaterally, no wheezing, rales,rhonchi or crepitation. No use of accessory muscles of respiration.  CARDIOVASCULAR: S1, S2 normal. No murmurs, rubs, or gallops.  ABDOMEN: Soft, nontender, nondistended. Bowel sounds present. No organomegaly or mass.  EXTREMITIES: No pedal  edema, cyanosis, or clubbing.  NEUROLOGIC: Cranial nerves II through XII are intact. Muscle strength 5/5 in all extremities. Sensation intact. Gait not checked.  PSYCHIATRIC: The patient is alert and oriented x 3.  SKIN: No obvious rash, lesion, or ulcer.    LABORATORY PANEL:   CBC Recent Labs  Lab 07/11/19 0341  WBC 5.6  HGB 11.6*  HCT 36.8*  PLT 254   ------------------------------------------------------------------------------------------------------------------  Chemistries  Recent Labs  Lab 07/11/19 0341  NA 137  K 3.6  CL 110  CO2 24  GLUCOSE 128*  BUN 19  CREATININE 1.08  CALCIUM 8.2*   ------------------------------------------------------------------------------------------------------------------  Cardiac Enzymes No results for input(s): TROPONINI in the last 168 hours. ------------------------------------------------------------------------------------------------------------------  RADIOLOGY:  Dg Chest Port 1 View  Result Date: 07/10/2019 CLINICAL DATA:  Increasing shortness of breath and leg swelling EXAM: PORTABLE CHEST 1 VIEW COMPARISON:  05/28/2019 FINDINGS: Cardiac shadow is again mildly enlarged but accentuated by the portable technique. Mild central vascular congestion is noted increased from the prior exam although no interstitial edema is seen. No focal infiltrate is noted. No bony abnormality is seen. IMPRESSION: Mild increase in vascular congestion. Electronically Signed   By: Gary Bentley M.D.   On: 07/10/2019 08:35    EKG:   Orders placed or performed during the hospital encounter of 07/10/19  . EKG 12-Lead  . EKG 12-Lead  . ED EKG  . ED EKG    ASSESSMENT AND PLAN:   #1 acute on chronic systolic heart failure, continue IV diuretics, EF 15 to 20%, patient follows up with [place] heart failure clinic, check daily weights, I&O's, patient is admitted for weight gain, lower extremity edema, continue IV  diuretics and possible discharge home  tomorrow with oral diuretics at a higher dose.  Follow-up with [facility] heart failure clinic.  Continue IV diuretics, beta-blockers, Entresto #2 tobacco abuse, counseled about quitting.  #3. diabetes mellitus type 2, continue sliding scale insulin with coverage, patient is on NovoLog Mix 70/30 10 units twice daily at home. 4.  Multiple LV apical thrombus, seen on last echo, patient was followed by cardiology, and as per previous note this is felt to be unlikely due to lack of apical hypokinesia, patient is not on currently anticoagulation.  Cardiology consult requested with Dr. Everlene Bentley.  All the records are reviewed and case discussed with Care Management/Social Workerr. Management plans discussed with the patient, family and they are in agreement.  CODE STATUS: Full code problem  TOTAL TIME TAKING CARE OF THIS PATIENT: 59minutes.   POSSIBLE D/C IN 1-2DAYS, DEPENDING ON CLINICAL CONDITION.   Gary Bentley M.D on 07/11/2019 at 3:39 PM  Between 7am to 6pm - Pager - [phone]  After 6pm go to [email or web address] - password [email or web address] [facility]  Office  [phone]  CC: Primary care physician; Patient, No Pcp Per   Note: This dictation was prepared with Dragon dictation along with smaller phrase technology. Any transcriptional errors that result from this process are unintentional.

## 2019-07-11 NOTE — Progress Notes (Signed)
Briefly reviewed definition of heart failure and signs and symptoms of an exacerbation.   Reviewed importance of and reason behind checking weight daily in the AM, after using the bathroom, but before getting dressed.  Discussed when to call the Dr= weight gain of >2lb overnight of 5lb in a week,   Discussed yellow zone= call MD: weight gain of >2lb overnight of 5lb in a  week, increased swelling, increased SOB when lying down, chest discomfort, dizziness, increased fatigue  Red Zone= call 911: struggle to breath, fainting or near fainting, significant chest pain  Reviewed low sodium diet <2g/day-provided handout of recommended and not recommended foods   Fluid restriction <2L/day  Reviewed how to read nutrition label  Reviewed why pt is on the medications (either make you feel better, live longer or keep you out of the hospital) and discussed monitoring and side effects  Discussed exercise:   Lu Duffel, PharmD, BCPS Clinical Pharmacist 07/11/2019 2:55 PM

## 2019-07-12 LAB — BASIC METABOLIC PANEL
Anion gap: 6 (ref 5–15)
BUN: 17 mg/dL (ref 6–20)
CO2: 22 mmol/L (ref 22–32)
Calcium: 8.7 mg/dL — ABNORMAL LOW (ref 8.9–10.3)
Chloride: 109 mmol/L (ref 98–111)
Creatinine, Ser: 1 mg/dL (ref 0.61–1.24)
GFR calc Af Amer: >60 mL/min (ref >60)
GFR calc non Af Amer: >60 mL/min (ref >60)
Glucose, Bld: 110 mg/dL — ABNORMAL HIGH (ref 70–99)
Potassium: 3.9 mmol/L (ref 3.5–5.1)
Sodium: 137 mmol/L (ref 135–145)

## 2019-07-12 LAB — GLUCOSE, CAPILLARY: Glucose-Capillary: 98 mg/dL (ref 70–99)

## 2019-07-12 MED ORDER — FUROSEMIDE 40 MG PO TABS: 40.0000 mg | ORAL_TABLET | Freq: Two times a day (BID) | ORAL | 0 refills | Status: DC

## 2019-07-12 MED ORDER — METOPROLOL SUCCINATE ER 25 MG PO TB24: 12.5000 mg | ORAL_TABLET | Freq: Every day | ORAL | 0 refills | Status: DC

## 2019-07-12 NOTE — Clinical Social Work Note (Signed)
Heart failure screen was completed by bedside nurse.  Patient also spoke with Darylene Price, at the HF clinic and he has an appointment scheduled.  CSW signing off please reconsult if social work needs arise.  Jones Broom. Kriti Katayama, MSW, LCSW [phone]  07/12/2019 10:55 AM

## 2019-07-12 NOTE — Progress Notes (Signed)
     Gary Bentley was admitted to the Hospital on 07/10/2019 and Discharged  07/12/2019 and should be excused from work/school   for 3  days starting 07/10/2019 , may return to work/school without any restrictions.    Epifanio Lesches M.D on 07/12/2019,at 9:24 AM

## 2019-07-12 NOTE — Progress Notes (Signed)
Cardiovascular and Pulmonary Nurse Navigator Note:    Rounded on patient in follow-up to initial education session on 07/11/2019.  Patient able to answer all the HF Teach Back questions correctly this morning.  Patient assured this RN that he was going to start weighing himself every morning.  (Note:  Patient had not been doing daily weights prior to admission.)    Dietitian Consultation for diet education completed this morning.    Patient for discharge today.   *[facility] [facility] - Patient is an established patient in the [facility].  Patient's next appt in the [facility] HF Clinic July 18, 2019 at 11:30 a.m.  Encouraged patient to keep this appointment.    Patient is also followed by the [place] HF Paramedicine program. (Note:   Nile Dear, EMT conducted a virtual visit with this patient on 07/07/2019 prior to admission.)   Nile Dear, EMT will continue to follow patient on an outpatient basis.    Roanna Epley, RN, BSN, [facility] [facility]  Cardiovascular & Pulmonary Nurse Navigator  Direct Line: [phone]  Department Phone #: [phone] Fax: [phone]  Email Address: [email or web address]

## 2019-07-12 NOTE — Plan of Care (Signed)
Nutrition Education Note  RD consulted for nutrition education regarding CHF.  48 y/o male admitted with CHF  Pt is well know to this RD from multiple previous admits. Pt has been provided with CHF diet education numerous times. Spoke with pt today. Pt has made some positive changes regarding a low sodium diet. Pt reports he no longer uses the salt shaker. He has mainly been grilling his foods and is using low sodium marinades. Pt reports that he only eats at restaurants occassionally. Pt has been better about reading labels. Pt reports that he does not weigh himself daily but he weighs a few times a week.   RD provided praise and support for patient about the changes he has made. RD reiterated some key points of the low sodium diet focusing mainly on limiting processed or packaged foods; gave examples such as ketchup, mustard, steak sauce, etc)    Pt declines any education materials today as he reports he still has the paperwork that was given to him during his last admit.   RD discussed why it is important for patient to adhere to diet recommendations, and emphasized the role of fluids, foods to avoid, and importance of weighing self daily. Teach back method used.  Expect good compliance.  Body mass index is 30.74 kg/m. Pt meets criteria for overweight based on current BMI.  Current diet order is HH/CHO, patient is consuming approximately 100% of meals at this time. Labs and medications reviewed.   Pt enjoys drinking Ensure. Will add Ensure Max protein to meal trays as discharge reconciliation has begun.   No further nutrition interventions warranted at this time. RD contact information provided. If additional nutrition issues arise, please re-consult RD.   Koleen Distance MS, RD, LDN Pager #- [phone] Office#- [phone] After Hours Pager: [phone]

## 2019-07-12 NOTE — Plan of Care (Signed)
  Problem: Education: Goal: Knowledge of General Education information will improve Description: Including pain rating scale, medication(s)/side effects and non-pharmacologic comfort measures Outcome: Adequate for Discharge   Problem: Health Behavior/Discharge Planning: Goal: Ability to manage health-related needs will improve Outcome: Adequate for Discharge   Problem: Clinical Measurements: Goal: Ability to maintain clinical measurements within normal limits will improve Outcome: Adequate for Discharge Goal: Will remain free from infection Outcome: Adequate for Discharge Goal: Diagnostic test results will improve Outcome: Adequate for Discharge Goal: Respiratory complications will improve Outcome: Adequate for Discharge Goal: Cardiovascular complication will be avoided Outcome: Adequate for Discharge   Problem: Nutrition: Goal: Adequate nutrition will be maintained Outcome: Adequate for Discharge   Problem: Safety: Goal: Ability to remain free from injury will improve Outcome: Adequate for Discharge   Problem: Education: Goal: Ability to demonstrate management of disease process will improve Outcome: Adequate for Discharge Goal: Ability to verbalize understanding of medication therapies will improve Outcome: Adequate for Discharge Goal: Individualized Educational Video(s) Outcome: Adequate for Discharge   Problem: Activity: Goal: Capacity to carry out activities will improve Outcome: Adequate for Discharge   Problem: Cardiac: Goal: Ability to achieve and maintain adequate cardiopulmonary perfusion will improve Outcome: Adequate for Discharge

## 2019-07-12 NOTE — Progress Notes (Signed)
Pt discharged to home via wc.  Instructions  given to pt.  Questions answered.  No distress.  

## 2019-07-13 NOTE — Consult Note (Signed)
Reason for Consult: Shortness of breath congestive heart failure cardiomyopathy Referring Physician: Dr. Gabriel Rainwater hospitalist Cardiologist [facility]  Gary Bentley is an 48 y.o. male.  HPI: Patient is a 48 year old black male initially presented with acute onset of congestive heart failure severe cardiomyopathy ejection fraction less than 25% patient was diuresed initially thought to have apical thrombi patient was treated conservatively he has had systolic heart failure as well as cardiomyopathy patient was seen in the office in scheduled for functional study as well as repeat echocardiogram patient also had prescriptions arranged for switching to Entresto diuretics and beta-blockers and for one reason or another that was not initiated.  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 CHF (congestive heart failure) ([place])   . Diabetes mellitus without complication [facility])     Past Surgical History:  Procedure Laterality Date  . FOOT SURGERY    . RIGHT/LEFT HEART CATH AND CORONARY ANGIOGRAPHY N/A 01/13/2019   Procedure: RIGHT/LEFT HEART CATH AND CORONARY ANGIOGRAPHY;  Surgeon: Minna Merritts, MD;  Location: [place] [facility];  Service: Cardiovascular;  Laterality: N/A;    Family History  Family history unknown: Yes    Social History:  reports that he quit smoking about 2 years ago. His smoking use included cigarettes. He smoked 1.00 pack per day. He has never used smokeless tobacco. He reports that he does not drink alcohol or use drugs.  Allergies:  Allergies  Allergen Reactions  . Bee Venom Anaphylaxis    Medications: I have reviewed the patient's current medications.  Results for orders placed or performed during the hospital encounter of 07/10/19 (from the past 48 hour(s))   Glucose, capillary     Status: Abnormal   Collection Time: 07/11/19  4:59 PM  Result Value Ref Range   Glucose-Capillary 150 (H) 70 - 99 mg/dL  Glucose, capillary     Status: Abnormal   Collection Time: 07/11/19  9:08 PM  Result Value Ref Range   Glucose-Capillary 115 (H) 70 - 99 mg/dL  Basic metabolic panel     Status: Abnormal   Collection Time: 07/12/19  6:21 AM  Result Value Ref Range   Sodium 137 135 - 145 mmol/L   Potassium 3.9 3.5 - 5.1 mmol/L   Chloride 109 98 - 111 mmol/L   CO2 22 22 - 32 mmol/L   Glucose, Bld 110 (H) 70 - 99 mg/dL   BUN 17 6 - 20 mg/dL   Creatinine, Ser 1.00 0.61 - 1.24 mg/dL   Calcium 8.7 (L) 8.9 - 10.3 mg/dL   GFR calc non Af Amer >60 >60 mL/min   GFR calc Af Amer >60 >60 mL/min   Anion gap 6 5 - 15    Comment: Performed at [facility], [address] [place], [place] [address]  Glucose, capillary     Status: None   Collection Time: 07/12/19  7:50 AM  Result Value Ref Range   Glucose-Capillary 98 70 - 99 mg/dL    No results found.  Review of Systems  Constitutional: Positive for malaise/fatigue.  HENT: Positive for congestion.   Eyes: Negative.   Respiratory: Positive for shortness of breath.   Cardiovascular: Positive for palpitations, orthopnea, leg swelling and PND.  Gastrointestinal: Negative.   Genitourinary: Negative.   Musculoskeletal:  Negative.   Skin: Negative.   Neurological: Negative.   Endo/Heme/Allergies: Negative.   Psychiatric/Behavioral: Negative.    Blood pressure 103/67, pulse 79, temperature 98 F (36.7 C), temperature source Oral, resp. rate 19, height 6\' 5"  (1.956 m), weight 117.6 kg, SpO2 97 %. Physical Exam  Nursing note and vitals reviewed. Constitutional: He is oriented to person, place, and time. He appears well-developed and well-nourished.  HENT:  Head: Normocephalic and atraumatic.  Eyes: Pupils are equal, round, and reactive to light. Conjunctivae and EOM are normal.  Neck: Normal range of  motion. Neck supple.  Cardiovascular: Normal rate and regular rhythm. Exam reveals gallop.  Murmur heard. Respiratory: Effort normal and breath sounds normal.  GI: Soft. Bowel sounds are normal.  Musculoskeletal:        General: Edema present.  Neurological: He is alert and oriented to person, place, and time. He has normal reflexes.  Skin: Skin is warm and dry.  Psychiatric: He has a normal mood and affect.    Assessment/Plan: Congestive heart failure Cardiomyopathy Shortness of breath Diabetes Former smoker Hypertension . Plan Agree with admit to telemetry Follow-up cardiac enzymes and EKG Recommend Entresto for heart failure Continue diuretic therapy Beta-blockade therapy for heart failure and blood pressure control Continue diabetes management and control Continue to advised to refrain from tobacco abuse Advised to refrain from noncompliance Follow-up with the heart failure clinic Have patient follow-up with cardiology upon discharge 1 to 2 weeks  Gary Bentley 07/13/2019, 12:48 PM

## 2019-07-14 NOTE — Discharge Summary (Signed)
Gary Bentley, is a 48 y.o. male  DOB 11/02/1972  MRN [id number].  Admission date:  07/10/2019  Admitting Physician  Sela Hua, MD  Discharge Date:  07/13/2019   Primary MD  Patient, No Pcp Per  Recommendations for primary care physician for things to follow:   Follow-up with primary cardiologist Dr. Clayborn Bigness.  Follow-up with CHF clinic.   Admission Diagnosis  SOB (shortness of breath) [R06.02]   Discharge Diagnosis  SOB (shortness of breath) [R06.02]    Active Problems:   Acute on chronic systolic CHF (congestive heart failure) ([place])      Past Medical History:  Diagnosis Date  . CHF (congestive heart failure) ([place])   . Diabetes mellitus without complication [facility])     Past Surgical History:  Procedure Laterality Date  . FOOT SURGERY    . RIGHT/LEFT HEART CATH AND CORONARY ANGIOGRAPHY N/A 01/13/2019   Procedure: RIGHT/LEFT HEART CATH AND CORONARY ANGIOGRAPHY;  Surgeon: Minna Merritts, MD;  Location: [place] [facility];  Service: Cardiovascular;  Laterality: N/A;       History of present illness and  Hospital Course:     Kindly see H&P for history of present illness and admission details, please review complete Labs, Consult reports and Test reports for all details in brief  HPI  from the history and physical done on the day of admission 48 year old patient with history of chronic systolic heart failure, diabetes mellitus type 2 admitted because of orthopnea, PND, CHF exacerbation.   Hospital Course   Acute on chronic systolic heart failure with elevated BNP of 968 on admission, admitted for CHF exacerbation, patient has systolic heart failure with EF 15%, follows with Dr. Clayborn Bigness as an outpatient and also follows up with CHF clinic.  Patient is continued on IV Lasix 40 mg daily, patient symptoms  improved nicely, discharged home with Lasix 40 mg p.o. twice daily for 3 days  and then he can go back to Lasix 20 mg home dose twice daily. 2.  Systolic heart failure, patient already on Entresto, metoprolol, Imdur, hydralazine. 3.  History of chronic systolic heart failure Patient is aware of checking daily weights, I&O's. 4.  Left ventricular apical thrombus seen on last echo, as per previous cardiology note felt to be likely secondary to lack of apical hypokinesia. 5.  Hypertension, patient can continue Imdur, hydralazine, metoprolol, Entresto. Patient is followed by CHF nurse.  Discharge Condition: Stable  Follow UP  Follow-up Information    Gary Bentley Follow up on 07/18/2019.   Specialty: Cardiology Why: at 11:30am Contact information: [address] [place] [phone]       Gary Bush, MD. Schedule an appointment as soon as possible for a visit on 07/21/2019.   Specialty: Cardiology Why: appointment at 3:20pm Contact information: [address] [place] [address] [phone]             Discharge Instructions  and  Discharge Medications      Allergies as of 07/12/2019      Reactions   Bee Venom Anaphylaxis      Medication List    STOP taking these medications   insulin detemir 100 UNIT/ML injection Commonly known as: LEVEMIR     TAKE these medications   Entresto 49-51 MG Generic drug: sacubitril-valsartan Take 1 tablet by mouth 2 (two) times daily.   furosemide 40 MG tablet Commonly known as: Lasix Take 1 tablet (  40 mg total) by mouth 2 (two) times daily. What changed:   medication strength  when to take this   hydrALAZINE 50 MG tablet Commonly known as: APRESOLINE Take 50 mg by mouth 3 (three) times daily.   insulin aspart protamine - aspart (70-30) 100 UNIT/ML FlexPen Commonly known as: NovoLOG Mix 70/30 FlexPen Inject 0.1 mLs  (10 Units total) into the skin 2 (two) times daily.   isosorbide mononitrate 30 MG 24 hr tablet Commonly known as: IMDUR Take 30 mg by mouth daily.   metoprolol succinate 25 MG 24 hr tablet Commonly known as: TOPROL-XL Take 0.5 tablets (12.5 mg total) by mouth daily. What changed: Another medication with the same name was removed. Continue taking this medication, and follow the directions you see here.   potassium chloride SA 20 MEQ tablet Commonly known as: K-DUR Take 20 mEq by mouth daily.         Diet and Activity recommendation: See Discharge Instructions above   Consults obtained -cardiology    Major procedures and Radiology Reports - PLEASE review detailed and final reports for all details, in brief -      Dg Chest Port 1 View  Result Date: 07/10/2019 CLINICAL DATA:  Increasing shortness of breath and leg swelling EXAM: PORTABLE CHEST 1 VIEW COMPARISON:  05/28/2019 FINDINGS: Cardiac shadow is again mildly enlarged but accentuated by the portable technique. Mild central vascular congestion is noted increased from the prior exam although no interstitial edema is seen. No focal infiltrate is noted. No bony abnormality is seen. IMPRESSION: Mild increase in vascular congestion. Electronically Signed   By: Inez Catalina M.D.   On: 07/10/2019 08:35    Micro Results     Recent Results (from the past 240 hour(s))  SARS Coronavirus 2 [facility] order, Performed in [facility] hospital lab) Nasopharyngeal Nasopharyngeal Swab     Status: None   Collection Time: 07/10/19 10:29 AM   Specimen: Nasopharyngeal Swab  Result Value Ref Range Status   SARS Coronavirus 2 NEGATIVE NEGATIVE Final    Comment: (NOTE) If result is NEGATIVE SARS-CoV-2 target nucleic acids are NOT DETECTED. The SARS-CoV-2 RNA is generally detectable in upper and lower  respiratory specimens during the acute phase of infection. The lowest  concentration of SARS-CoV-2 viral copies this assay can detect is 250   copies / mL. A negative result does not preclude SARS-CoV-2 infection  and should not be used as the sole basis for treatment or other  patient management decisions.  A negative result may occur with  improper specimen collection / handling, submission of specimen other  than nasopharyngeal swab, presence of viral mutation(s) within the  areas targeted by this assay, and inadequate number of viral copies  (<250 copies / mL). A negative result must be combined with clinical  observations, patient history, and epidemiological information. If result is POSITIVE SARS-CoV-2 target nucleic acids are DETECTED. The SARS-CoV-2 RNA is generally detectable in upper and lower  respiratory specimens dur ing the acute phase of infection.  Positive  results are indicative of active infection with SARS-CoV-2.  Clinical  correlation with patient history and other diagnostic information is  necessary to determine patient infection status.  Positive results do  not rule out bacterial infection or co-infection with other viruses. If result is PRESUMPTIVE POSTIVE SARS-CoV-2 nucleic acids MAY BE PRESENT.   A presumptive positive result was obtained on the submitted specimen  and confirmed on repeat testing.  While 2019 novel coronavirus  (SARS-CoV-2) nucleic acids  may be present in the submitted sample  additional confirmatory testing may be necessary for epidemiological  and / or clinical management purposes  to differentiate between  SARS-CoV-2 and other Sarbecovirus currently known to infect humans.  If clinically indicated additional testing with an alternate test  methodology [phone]) is advised. The SARS-CoV-2 RNA is generally  detectable in upper and lower respiratory sp ecimens during the acute  phase of infection. The expected result is Negative. Fact Sheet for Patients:  StrictlyIdeas.no Fact Sheet for Healthcare  Providers: BankingDealers.co.za This test is not yet approved or cleared by the [place] FDA and has been authorized for detection and/or diagnosis of SARS-CoV-2 by FDA under an Emergency Use Authorization (EUA).  This EUA will remain in effect (meaning this test can be used) for the duration of the COVID-19 declaration under Section 564(b)(1) of the Act, 21 U.S.C. section 360bbb-3(b)(1), unless the authorization is terminated or revoked sooner. Performed at [facility], [address], [place], [place] [address]        Today   Subjective:   Nataniel Pessolano today has no headache,no chest abdominal pain,no new weakness tingling or numbness, feels much better wants to go home today.   Objective:   Blood pressure 103/67, pulse 79, temperature 98 F (36.7 C), temperature source Oral, resp. rate 19, height 6\' 5"  (1.956 m), weight 117.6 kg, SpO2 97 %.  No intake or output data in the 24 hours ending 07/14/19 1549  Exam Awake Alert, Oriented x 3, No new F.N deficits, Normal affect [place].AT,PERRAL Supple Neck,No JVD, No cervical lymphadenopathy appriciated.  Symmetrical Chest wall movement, Good air movement bilaterally, CTAB RRR,No Gallops,Rubs or new Murmurs, No Parasternal Heave +ve B.Sounds, Abd Soft, Non tender, No organomegaly appriciated, No rebound -guarding or rigidity. No Cyanosis, Clubbing or edema, No new Rash or bruise  Data Review   CBC w Diff:  Lab Results  Component Value Date   WBC 5.6 07/11/2019   HGB 11.6 (L) 07/11/2019   HCT 36.8 (L) 07/11/2019   PLT 254 07/11/2019   LYMPHOPCT 33 07/11/2019   MONOPCT 15 07/11/2019   EOSPCT 4 07/11/2019   BASOPCT 1 07/11/2019    CMP:  Lab Results  Component Value Date   NA 137 07/12/2019   K 3.9 07/12/2019   CL 109 07/12/2019   CO2 22 07/12/2019   BUN 17 07/12/2019   CREATININE 1.00 07/12/2019   PROT 7.5 04/29/2019   ALBUMIN 3.7 04/29/2019   BILITOT 1.8 (H) 04/29/2019    ALKPHOS 96 04/29/2019   AST 19 04/29/2019   ALT 20 04/29/2019  .   Total Time in preparing paper work, data evaluation and todays exam - 14 minutes  Epifanio Lesches M.D on 07/13/2019 at 3:49 PM    Note: This dictation was prepared with Dragon dictation along with smaller phrase technology. Any transcriptional errors that result from this process are unintentional.

## 2019-07-18 ENCOUNTER — Telehealth (HOSPITAL_COMMUNITY): Payer: Self-pay

## 2019-07-18 ENCOUNTER — Ambulatory Visit: Payer: 59 | Admitting: Family

## 2019-07-18 NOTE — Progress Notes (Deleted)
   Patient ID: Gary Bentley, male    DOB: 11/02/1972, 48 y.o.   MRN: [id number]  HPI  Gary Bentley is a 48 y/o male with a history of  Echo report from 01/10/2019 reviewed and showed an EF of 15-20% along with trivial AR and mild pulmonic stenosis.   Catheterization done 01/13/2019 showed: Nonischemic cardiomyopathy, no significant CAD Mildly elevated wedge pressure,  EF severely depressed, global Hypokinesis, EF 20%  Admitted 07/10/2019 due to acute on chronic HF. Cardiology consult obtained. Initially given IV lasix and then transitioned to oral diuretics. Discharged after 2 days. Was in the ED 05/28/2019 due to fluid overloaded. Was being diuresed in the ED but patient left AMA prior to being admitted.   He presents today for a follow-up visit with a chief complaint of   Review of Systems    Physical Exam    Assessment & Plan:  1: Chronic heart failure with reduced ejection fraction- - NYHA class - saw cardiology Clayborn Bigness) 07/06/2019 - ? Titrate up entresto/ add farxiga - participating in paramedic program - BNP 07/10/2019 was 968.0  2: HTN- - BP - saw PCP [name]) 03/21/2019 - BMP 07/12/2019 reviewed and showed sodium 137, potassium 3.9, creatinine 1.0 and GFR >60  3: DM- - A1c 05/28/2019 was 5.6%

## 2019-07-18 NOTE — Telephone Encounter (Signed)
Attempted to contact, left message.  Will continue to try to contact.   Gary Bentley [place] EMT-Paramedic [phone] 

## 2019-07-19 ENCOUNTER — Telehealth: Payer: Self-pay | Admitting: Family

## 2019-07-19 ENCOUNTER — Ambulatory Visit: Payer: 59 | Admitting: Family

## 2019-07-19 LAB — GLUCOSE, CAPILLARY: Glucose-Capillary: 151 mg/dL — ABNORMAL HIGH (ref 70–99)

## 2019-07-19 NOTE — Telephone Encounter (Signed)
Patient did not show for his Heart Failure Clinic appointment on 07/19/2019. Will attempt to reschedule.

## 2019-07-20 ENCOUNTER — Encounter (HOSPITAL_COMMUNITY): Payer: Self-pay

## 2019-07-20 ENCOUNTER — Other Ambulatory Visit (HOSPITAL_COMMUNITY): Payer: Self-pay

## 2019-07-20 NOTE — Progress Notes (Signed)
Bgl: 49  Today went to visit Nix due to discharged from hospital recently, unable to get him on the phone and he missed HF clinic appt yesterday.  He states been doing ok, busy at work.  He did not remember he had appt yesterday, rescheduled it for next week, he states he will go.  He states losing weight and feeling better.  He states has all his medications, verified them.  He states sugars are running good.  Appetite is good.  Denies swelling and abdomen does not feel tight.  Denies shortness of breath, chest pain, headaches or dizziness.  Will continue to visit for heart failure and medication compliance.   [place] [phone]

## 2019-07-21 ENCOUNTER — Ambulatory Visit: Payer: 59 | Admitting: Internal Medicine

## 2019-07-23 NOTE — Progress Notes (Deleted)
   Patient ID: Gary Bentley, male    DOB: 1971-07-30, 48 y.o.   MRN: [id number]  HPI  Mr Jezewski is a 48 y/o male with a history of  Echo report from 01/10/2019 reviewed and showed an EF of 15-20% along with trivial AR and mild pulmonic stenosis.   Catheterization done 01/13/2019 showed: Nonischemic cardiomyopathy, no significant CAD Mildly elevated wedge pressure,  EF severely depressed, global Hypokinesis, EF 20%  Admitted 07/10/2019 due to acute on chronic HF. Cardiology consult obtained. Initially given IV lasix and then transitioned to oral diuretics. Discharged after 2 days. Was in the ED 05/28/2019 due to fluid overloaded. Was being diuresed in the ED but patient left AMA prior to being admitted.   He presents today for a follow-up visit with a chief complaint of   Review of Systems    Physical Exam    Assessment & Plan:  1: Chronic heart failure with reduced ejection fraction- - NYHA class - saw cardiology Clayborn Bigness) 07/06/2019 - ? Titrate up entresto/ add farxiga - participating in paramedic program - BNP 07/10/2019 was 968.0  2: HTN- - BP - saw PCP [name]) 03/21/2019 - BMP 07/12/2019 reviewed and showed sodium 137, potassium 3.9, creatinine 1.0 and GFR >60  3: DM- - A1c 05/28/2019 was 5.6%

## 2019-07-24 ENCOUNTER — Ambulatory Visit: Payer: 59 | Admitting: Family

## 2019-07-24 ENCOUNTER — Telehealth: Payer: Self-pay | Admitting: Family

## 2019-07-24 ENCOUNTER — Telehealth (HOSPITAL_COMMUNITY): Payer: Self-pay

## 2019-07-24 NOTE — Telephone Encounter (Signed)
Attempted to contact him to check to see why he missed HF clinic appt this am.  His phone is off, unable to reach him or leave a message.  Will continue to try to contact.   [place] [phone]

## 2019-07-24 NOTE — Telephone Encounter (Signed)
Patient did not show for his Heart Failure Clinic appointment on 07/24/2019. Will attempt to reschedule.

## 2019-08-01 ENCOUNTER — Telehealth (HOSPITAL_COMMUNITY): Payer: Self-pay

## 2019-08-01 NOTE — Telephone Encounter (Signed)
Today Zay states he tried to find the HF clinic but could not find it, that is why he missed the appt.  Contacted HF Clinic and they are going to reschedule it.  He states been doing well, feeling better.  He states has his medications.  Will continue to visit for heart failure. He denies chest pain, shortness of breath, headaches or dizziness.    [place] [phone]

## 2019-08-13 IMAGING — CT CT ABDOMEN AND PELVIS WITH CONTRAST
2 of 5 series · 16 of 46 positions shown, 18 images · IV contrast (APPLIED)
Comparison: 12/23/2018

CLINICAL DATA: Abdominal pain.

EXAM:
CT ABDOMEN AND PELVIS WITH CONTRAST
TECHNIQUE: Multidetector CT imaging of the abdomen and pelvis was performed
using the standard protocol following bolus administration of
intravenous contrast.
CONTRAST:  100mL OMNIPAQUE IOHEXOL 300 MG/ML  SOLN

[Series 2: routine abd/pel with · axial · 0.91mm/px · z∈[-1108,-668]mm · 13 of 100 slices shown, 15 images]
[im 6/100  soft-tissue]
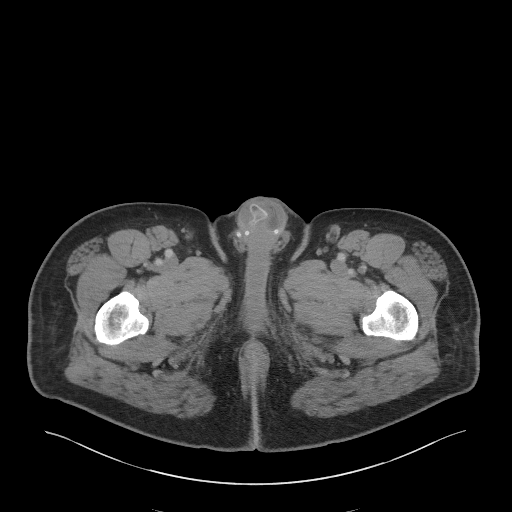
[im 6/100  bone]
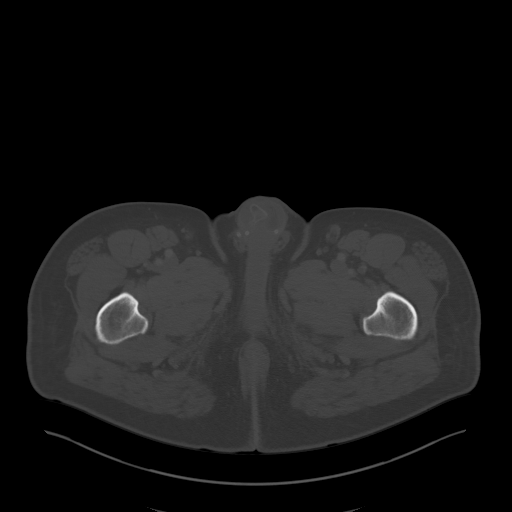
[im 16/100  soft-tissue]
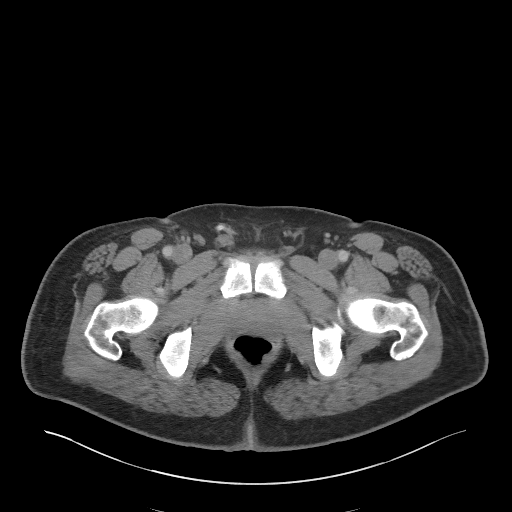
[im 21/100  soft-tissue]
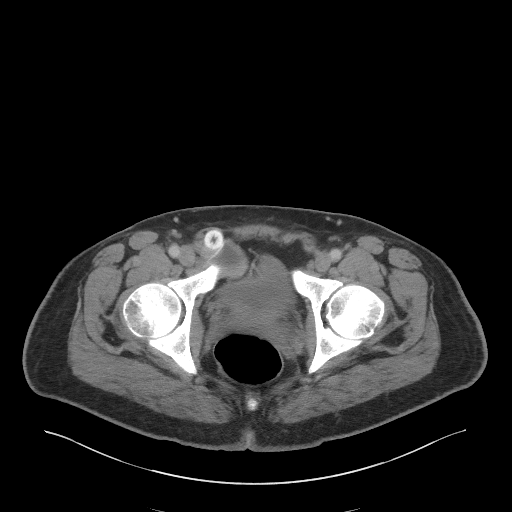
[im 27/100  soft-tissue]
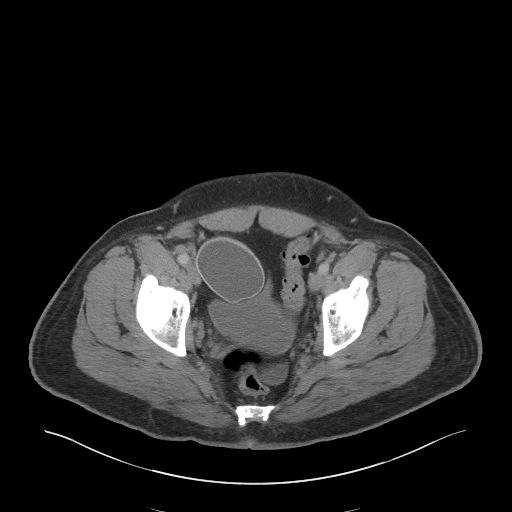
[im 37/100  soft-tissue]
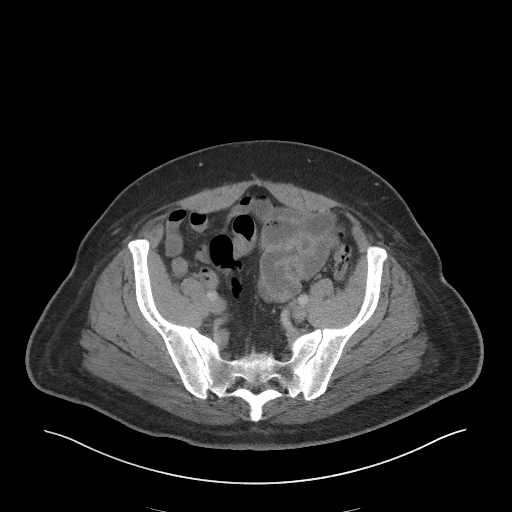
[im 42/100  soft-tissue]
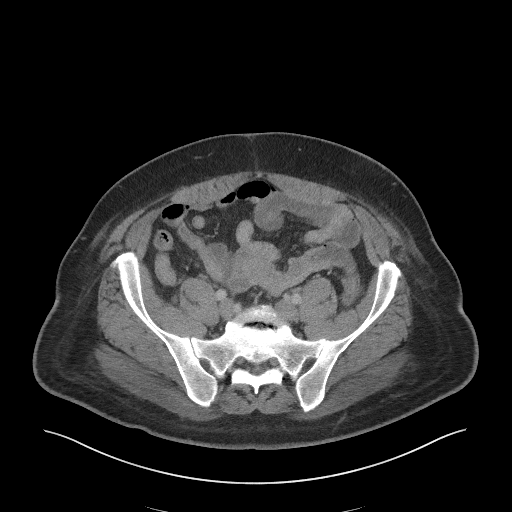
[im 53/100  soft-tissue]
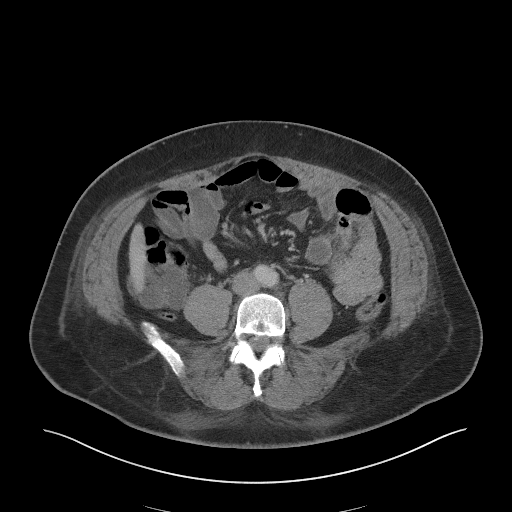
[im 58/100  soft-tissue]
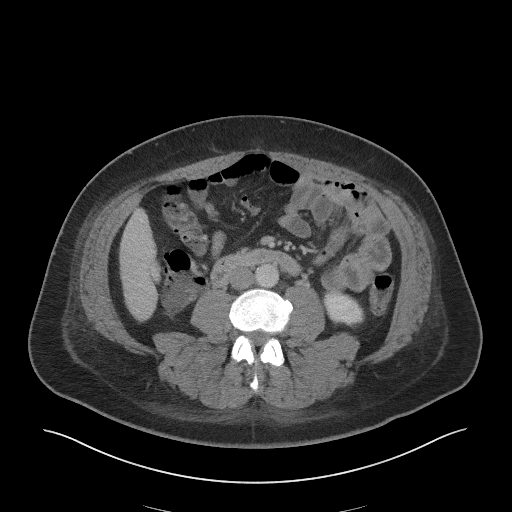
[im 63/100  soft-tissue]
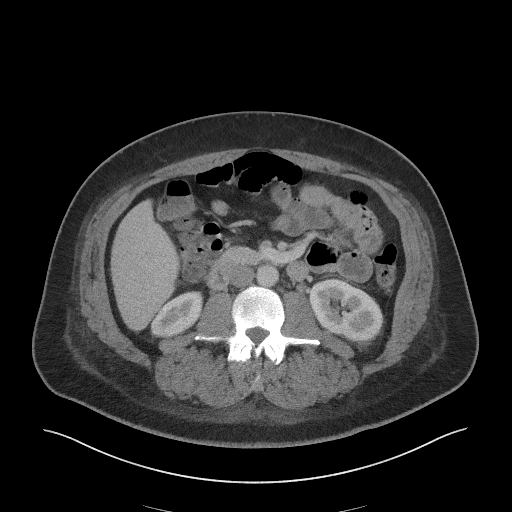
[im 63/100  bone]
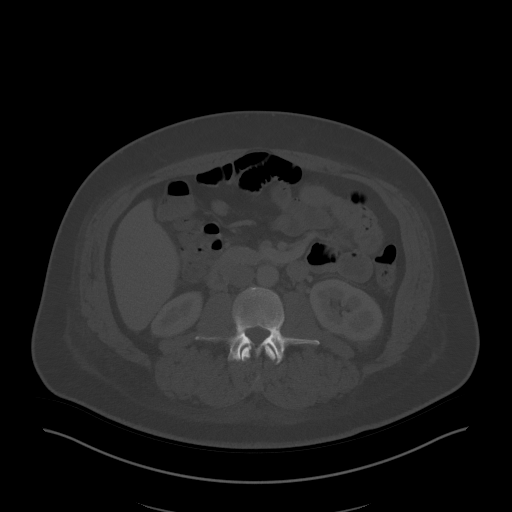
[im 73/100  soft-tissue]
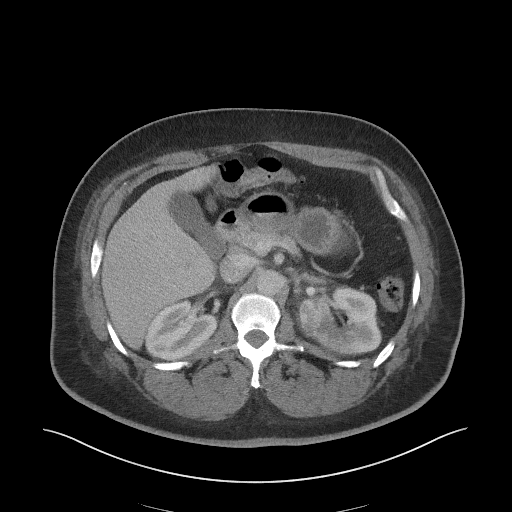
[im 79/100  soft-tissue]
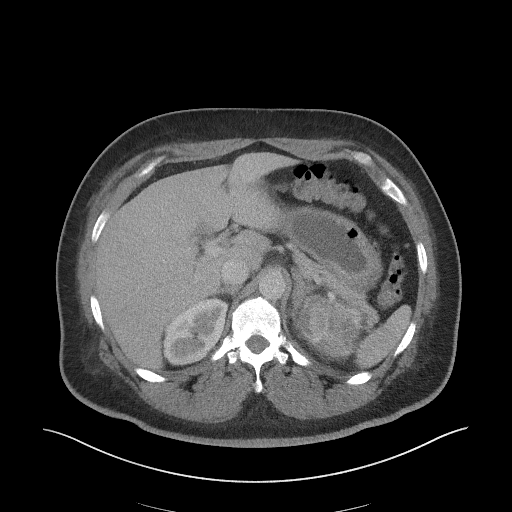
[im 84/100  soft-tissue]
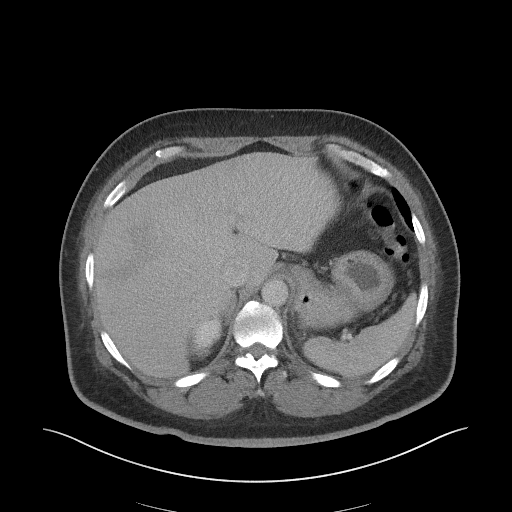
[im 94/100  soft-tissue]
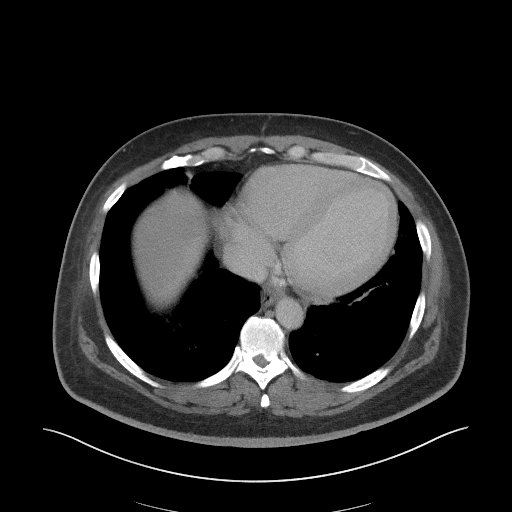

[Series 6: coronal st · coronal · 0.76mm/px · 3 of 103 slices shown]
[im 35/103  soft-tissue]
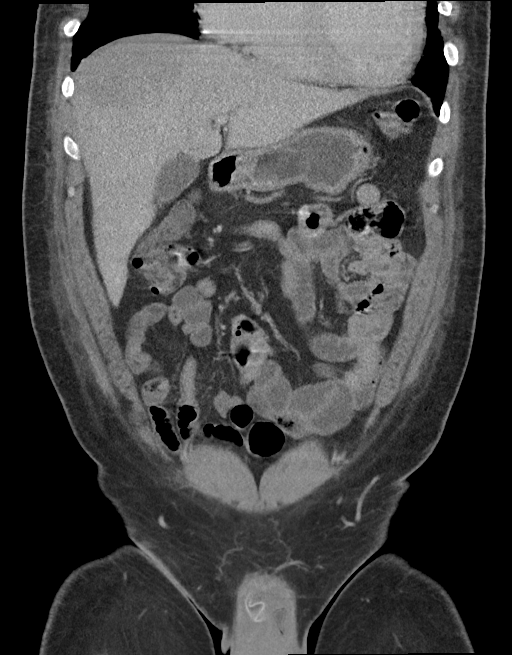
[im 46/103  soft-tissue]
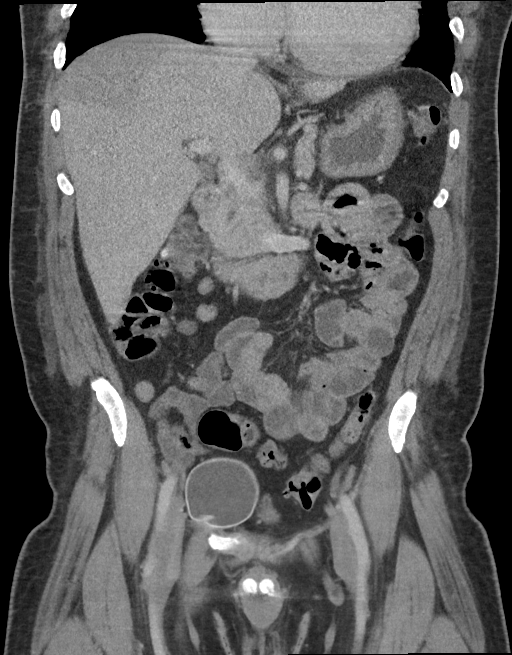
[im 57/103  soft-tissue]
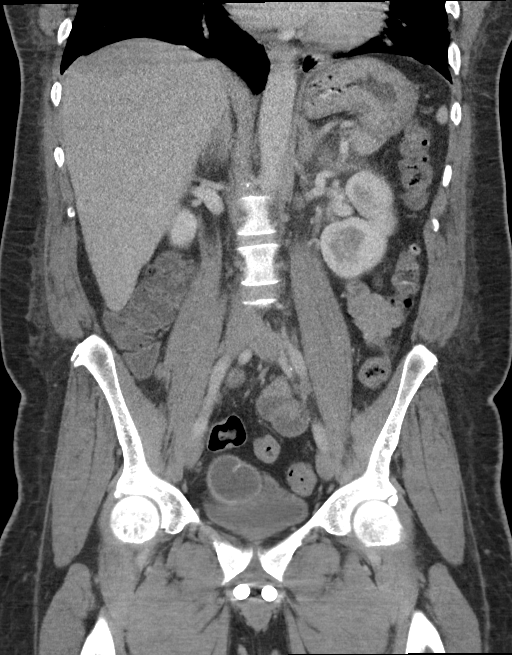

[16 of 46 positions shown; findings below may reference images not displayed]

FINDINGS: Lower chest: Heart is borderline in size. Linear atelectasis or
scarring in the lung bases. Small nodule in the left lower lobe at
the base measuring 6 mm on image 11. No effusions.

Hepatobiliary: Diffuse low-density throughout the liver compatible
with fatty infiltration. More prominent geographic area of
low-density in the right hepatic dome, likely more significant
geographic fatty infiltration. Gallbladder unremarkable.

Pancreas: No focal abnormality or ductal dilatation.

Spleen: No focal abnormality.  Normal size.

Adrenals/Urinary Tract: Fullness of the adrenal glands bilaterally,
left greater than right compatible with hyperplasia. There is
abnormal low-density and lack of enhancement within the upper and
midpole of the left kidney concerning for pyelonephritis. No
hydronephrosis. Urinary bladder unremarkable.

Stomach/Bowel: Appendix is normal. Stomach, large and small bowel
grossly unremarkable. Previously seen wall thickening in the colon
has resolved. No evidence of bowel obstruction.

Vascular/Lymphatic: No evidence of aneurysm or adenopathy.

Reproductive: Penile implant noted.  No acute findings.

Other: Trace free fluid in the pelvis.  No free air.

Musculoskeletal: No acute bony abnormality.
IMPRESSION: Improvement of the previously seen colitis.

Abnormal enhancement within the upper and mid poles of the left
kidney concerning for pyelonephritis.

Geographic fatty infiltration within the liver.

## 2019-08-17 ENCOUNTER — Telehealth (HOSPITAL_COMMUNITY): Payer: Self-pay

## 2019-08-17 ENCOUNTER — Other Ambulatory Visit: Payer: Self-pay | Admitting: Family

## 2019-08-17 MED ORDER — FUROSEMIDE 40 MG PO TABS: 40.0000 mg | ORAL_TABLET | Freq: Two times a day (BID) | ORAL | 5 refills | Status: DC

## 2019-08-17 NOTE — Telephone Encounter (Signed)
Contacted Gary Bentley to tell him of appt at HF clinic next week.  He then informed me he was out of meds, as of today.  Contacted [facility], he has refills on everything except lasix 40 mg twice a day, he has refills on 20 mg once a day due to after office hours.   Advised him to get it filled and take 2 twice a day to equal what he has been taking.  Sent Gary Bentley at [facility] clinic a message to send new prescription for lasix.  Advised him he buys his insulin over the counter, that he will have to ask for it.  He appears to understand and I gave him directions to HF clinic, to go to [facility] and tell them he needs to go to HF clinic.  He states he will go, will remind him again next week.    [place] [phone]

## 2019-08-23 NOTE — Progress Notes (Signed)
Patient ID: Gary Bentley, male    DOB: [birth year], 48 y.o.   MRN: [id number]  HPI  Gary Bentley is Bentley 48 y/o male with Bentley history of DM, HTN, previous tobacco use and chronic heart failure.   Echo report from 01/10/2019 reviewed and showed an EF of 15-20% along with trivial AR and mild pulmonic stenosis.   Catheterization done 01/13/2019 showed: Nonischemic cardiomyopathy, no significant CAD Mildly elevated wedge pressure,  EF severely depressed, global Hypokinesis, EF 20%  Admitted 07/10/2019 due to acute on chronic HF. Cardiology consult obtained. Initially given IV lasix and then transitioned to oral diuretics. Discharged after 2 days. Was in the ED 05/28/2019 due to fluid overloaded. Was being diuresed in the ED but patient left AMA prior to being admitted.   He presents today for Bentley follow-up visit with Bentley chief complaint of minimal shortness of breath upon moderate exertion. He describes this as chronic in nature having been present for several years. He has no associated symptoms and specifically denies any difficulty sleeping, dizziness, abdominal distention, palpitations, pedal edema, chest pain, cough, fatigue or weight gain.   He says that he never started entresto because of "an issue with the pharmacy, not sure what".   Past Medical History:  Diagnosis Date  . CHF (congestive heart failure) ([place])   . Diabetes mellitus without complication [facility])    Past Surgical History:  Procedure Laterality Date  . FOOT SURGERY    . RIGHT/LEFT HEART CATH AND CORONARY ANGIOGRAPHY N/Bentley 01/13/2019   Procedure: RIGHT/LEFT HEART CATH AND CORONARY ANGIOGRAPHY;  Surgeon: Gary Merritts, Gary Bentley;  Location: [place] [facility];  Service: Cardiovascular;  Laterality: N/Bentley;   Family History  Family history unknown: Yes   Social History   Tobacco Use  . Smoking status: Former Smoker    Packs/day: 1.00    Types: Cigarettes    Quit date: 04/17/2017    Years since quitting: 2.3  . Smokeless tobacco:  Never Used  Substance Use Topics  . Alcohol use: No   Allergies  Allergen Reactions  . Bee Venom Anaphylaxis   Prior to Admission medications   Medication Sig Start Date End Date Taking? Authorizing Gary Bentley  furosemide (LASIX) 40 MG tablet Take 1 tablet (40 mg total) by mouth 2 (two) times daily. 08/17/19 08/16/20 Yes Gary Bentley, Gary Kluver Bentley, Gary Bentley  hydrALAZINE (APRESOLINE) 50 MG tablet Take 50 mg by mouth 3 (three) times daily.   Yes Gary Bentley, Historical, Gary Bentley  insulin aspart protamine - aspart (NOVOLOG MIX 70/30 FLEXPEN) (70-30) 100 UNIT/ML FlexPen Inject 0.1 mLs (10 Units total) into the skin 2 (two) times daily. 01/15/19  Yes Gary Flock, Gary Bentley  isosorbide mononitrate (IMDUR) 30 MG 24 hr tablet Take 30 mg by mouth daily.   Yes Gary Bentley, Historical, Gary Bentley  metoprolol succinate (TOPROL-XL) 25 MG 24 hr tablet Take 0.5 tablets (12.5 mg total) by mouth daily. Patient taking differently: Take 25 mg by mouth daily.  07/12/19  Yes Gary Lesches, Gary Bentley  potassium chloride SA (K-DUR) 20 MEQ tablet Take 20 mEq by mouth daily.   Yes Gary Bentley, Historical, Gary Bentley    Review of Systems  Constitutional: Negative for appetite change and fatigue.  HENT: Negative for congestion, postnasal drip and sore throat.   Eyes: Negative.   Respiratory: Positive for shortness of breath (minimal). Negative for cough.   Cardiovascular: Negative for chest pain, palpitations and leg swelling.  Gastrointestinal: Negative for abdominal distention and abdominal pain.  Endocrine: Negative.   Genitourinary: Negative.   Musculoskeletal:  Negative for back pain and neck pain.  Skin: Negative.   Allergic/Immunologic: Negative.   Neurological: Negative for dizziness and light-headedness.  Hematological: Negative for adenopathy. Does not bruise/bleed easily.  Psychiatric/Behavioral: Negative for dysphoric mood and sleep disturbance. The patient is not nervous/anxious.    Vitals:   08/25/19 1000  BP: 119/85  Pulse: 94  Resp: 18  SpO2:  100%  Weight: 269 lb 9.6 oz (122.3 kg)  Height: 6\' 5"  (1.956 m)   Wt Readings from Last 3 Encounters:  08/25/19 269 lb 9.6 oz (122.3 kg)  07/20/19 233 lb (105.7 kg)  07/12/19 259 lb 4.2 oz (117.6 kg)   Lab Results  Component Value Date   CREATININE 1.00 07/12/2019   CREATININE 1.08 07/11/2019   CREATININE 1.03 07/10/2019    Physical Exam Vitals signs and nursing note reviewed.  Constitutional:      Appearance: Normal appearance.  HENT:     Head: Normocephalic and atraumatic.  Neck:     Musculoskeletal: Normal range of motion and neck supple.  Cardiovascular:     Rate and Rhythm: Normal rate and regular rhythm.  Pulmonary:     Effort: Pulmonary effort is normal.     Breath sounds: Rhonchi (throughout all lung fields) present. No wheezing or rales.  Abdominal:     General: There is no distension.     Palpations: Abdomen is soft.     Tenderness: There is no abdominal tenderness.  Musculoskeletal:        General: No tenderness.     Right lower leg: No edema.     Left lower leg: No edema.  Skin:    General: Skin is warm and dry.  Neurological:     General: No focal deficit present.     Mental Status: He is alert and oriented to person, place, and time.  Psychiatric:        Mood and Affect: Mood normal.        Behavior: Behavior normal.        Thought Content: Thought content normal.     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 - saw cardiology Gary Bentley) Gary Bentley - he says that he never started entresto because of something with his pharmacy; patient given Bentley voucher to get the first 30 days free of entresto and have sent RX  to pharmacy to begin 24/26mg  twice daily - will check BMP at his next visit - participating in paramedic program - BNP 07/10/2019 was 968.0  2: HTN- - BP looks good today - saw Gary Bentley Gary Bentley) 03/21/2019 - BMP 07/12/2019 reviewed and showed sodium 137, potassium 3.9, creatinine 1.0 and GFR >60  3: DM- - A1c 05/28/2019 was 5.6% - he forgot to check his glucose today  Medication bottles were reviewed.   Return in 1 month or sooner for any questions/problems before then.

## 2019-08-25 ENCOUNTER — Other Ambulatory Visit: Payer: Self-pay

## 2019-08-25 ENCOUNTER — Ambulatory Visit: Payer: 59 | Attending: Family | Admitting: Family

## 2019-08-25 ENCOUNTER — Encounter: Payer: Self-pay | Admitting: Family

## 2019-08-25 VITALS — BP 119/85 | HR 94 | Resp 18 | Ht 77.0 in | Wt 269.6 lb

## 2019-08-25 DIAGNOSIS — R0602 Shortness of breath: Secondary | ICD-10-CM | POA: Diagnosis present

## 2019-08-25 DIAGNOSIS — I110 Hypertensive heart disease with heart failure: Secondary | ICD-10-CM | POA: Diagnosis not present

## 2019-08-25 DIAGNOSIS — E119 Type 2 diabetes mellitus without complications: Secondary | ICD-10-CM | POA: Diagnosis not present

## 2019-08-25 DIAGNOSIS — E109 Type 1 diabetes mellitus without complications: Secondary | ICD-10-CM

## 2019-08-25 DIAGNOSIS — Z79899 Other long term (current) drug therapy: Secondary | ICD-10-CM | POA: Insufficient documentation

## 2019-08-25 DIAGNOSIS — I5022 Chronic systolic (congestive) heart failure: Secondary | ICD-10-CM | POA: Diagnosis not present

## 2019-08-25 DIAGNOSIS — I428 Other cardiomyopathies: Secondary | ICD-10-CM | POA: Insufficient documentation

## 2019-08-25 DIAGNOSIS — Z87891 Personal history of nicotine dependence: Secondary | ICD-10-CM | POA: Insufficient documentation

## 2019-08-25 DIAGNOSIS — I10 Essential (primary) hypertension: Secondary | ICD-10-CM

## 2019-08-25 MED ORDER — SACUBITRIL-VALSARTAN 24-26 MG PO TABS: 1.0000 | ORAL_TABLET | Freq: Two times a day (BID) | ORAL | 3 refills | Status: DC

## 2019-08-25 NOTE — Patient Instructions (Signed)
Continue weighing daily and call for an overnight weight gain of > 2 pounds or a weekly weight gain of >5 pounds. 

## 2019-09-12 ENCOUNTER — Telehealth (HOSPITAL_COMMUNITY): Payer: Self-pay

## 2019-09-12 NOTE — Telephone Encounter (Signed)
Today had a telephone appt with Gary Bentley.  He states been doing well, Denies any problems or swelling.  He states has all his medications.  He states working all the time.  He is aware of up coming appts.  Will continue to visit for heart fialure and medication compliance.   [place] [phone]

## 2019-09-19 NOTE — Progress Notes (Signed)
Patient ID: Gary Bentley, male    DOB: 04/23/1971, 48 y.o.   MRN: [id number]  HPI  Mr Jeffs is a 48 y/o male with a history of DM, HTN, previous tobacco use and chronic heart failure.   Echo report from 01/10/2019 reviewed and showed an EF of 15-20% along with trivial AR and mild pulmonic stenosis.   Catheterization done 01/13/2019 showed: Nonischemic cardiomyopathy, no significant CAD Mildly elevated wedge pressure,  EF severely depressed, global Hypokinesis, EF 20%  Admitted 07/10/2019 due to acute on chronic HF. Cardiology consult obtained. Initially given IV lasix and then transitioned to oral diuretics. Discharged after 2 days. Was in the ED 05/28/2019 due to fluid overloaded. Was being diuresed in the ED but patient left AMA prior to being admitted.   He presents today with a chief complaint of a follow-up visit. He does endorse having some calluses on the bottoms of both of his feet and gets followed by podiatry about this. He denies any other complaints and specifically denies any difficulty sleeping, dizziness, abdominal distention, palpitations, pedal edema, chest pain, shortness of breath, cough or fatigue. Does admit to a gradual weight gain but says that he's eating more.   Past Medical History:  Diagnosis Date  . CHF (congestive heart failure) ([place])   . Diabetes mellitus without complication ([place])   . Hypertension    Past Surgical History:  Procedure Laterality Date  . FOOT SURGERY    . RIGHT/LEFT HEART CATH AND CORONARY ANGIOGRAPHY N/A 01/13/2019   Procedure: RIGHT/LEFT HEART CATH AND CORONARY ANGIOGRAPHY;  Surgeon: Minna Merritts, MD;  Location: [facility];  Service: Cardiovascular;  Laterality: N/A;   Family History  Family history unknown: Yes   Social History   Tobacco Use  . Smoking status: Former Smoker    Packs/day: 1.00    Types: Cigarettes    Quit date: 04/17/2017    Years since quitting: 2.4  . Smokeless tobacco: Never Used  Substance Use  Topics  . Alcohol use: No   Allergies  Allergen Reactions  . Bee Venom Anaphylaxis   Prior to Admission medications   Medication Sig Start Date End Date Taking? Authorizing Provider  furosemide (LASIX) 40 MG tablet Take 1 tablet (40 mg total) by mouth 2 (two) times daily. 08/17/19 08/16/20 Yes Aqeel Norgaard, Otila Kluver A, FNP  insulin aspart protamine - aspart (NOVOLOG MIX 70/30 FLEXPEN) (70-30) 100 UNIT/ML FlexPen Inject 0.1 mLs (10 Units total) into the skin 2 (two) times daily. 01/15/19  Yes Dustin Flock, MD  isosorbide mononitrate (IMDUR) 30 MG 24 hr tablet Take 30 mg by mouth daily.   Yes [provider]  metoprolol succinate (TOPROL-XL) 25 MG 24 hr tablet Take 0.5 tablets (12.5 mg total) by mouth daily. Patient taking differently: Take 25 mg by mouth daily.  07/12/19  Yes Epifanio Lesches, MD  potassium chloride SA (K-DUR) 20 MEQ tablet Take 20 mEq by mouth daily.   Yes [provider]  sacubitril-valsartan (ENTRESTO) 24-26 MG Take 1 tablet by mouth 2 (two) times daily. 08/25/19  Yes Casin Federici, Otila Kluver A, FNP  hydrALAZINE (APRESOLINE) 50 MG tablet Take 50 mg by mouth 3 (three) times daily.    [provider]     Review of Systems  Constitutional: Negative for appetite change and fatigue.  HENT: Negative for congestion, postnasal drip and sore throat.   Eyes: Negative.   Respiratory: Negative for cough and shortness of breath.   Cardiovascular: Negative for chest pain, palpitations and leg swelling.  Gastrointestinal: Negative for abdominal distention and abdominal pain.  Endocrine: Negative.   Genitourinary: Negative.   Musculoskeletal: Negative for back pain and neck pain.  Skin: Negative.   Allergic/Immunologic: Negative.   Neurological: Negative for dizziness and light-headedness.  Hematological: Negative for adenopathy. Does not bruise/bleed easily.  Psychiatric/Behavioral: Negative for dysphoric mood and sleep disturbance. The patient is not nervous/anxious.     Vitals:   09/20/19 1205  BP: (!) 133/97  Pulse: 84  Resp: 18  SpO2: 100%  Weight: 276 lb 9.6 oz (125.5 kg)  Height: 6\' 5"  (1.956 m)   Wt Readings from Last 3 Encounters:  09/20/19 276 lb 9.6 oz (125.5 kg)  08/25/19 269 lb 9.6 oz (122.3 kg)  07/20/19 233 lb (105.7 kg)   Lab Results  Component Value Date   CREATININE 1.00 07/12/2019   CREATININE 1.08 07/11/2019   CREATININE 1.03 07/10/2019    Physical Exam Vitals signs and nursing note reviewed.  Constitutional:      Appearance: Normal appearance.  HENT:     Head: Normocephalic and atraumatic.  Neck:     Musculoskeletal: Normal range of motion and neck supple.  Cardiovascular:     Rate and Rhythm: Normal rate and regular rhythm.  Pulmonary:     Effort: Pulmonary effort is normal.     Breath sounds: Rhonchi (bilateral lower lobes) present. No wheezing or rales.  Abdominal:     General: There is no distension.     Palpations: Abdomen is soft.     Tenderness: There is no abdominal tenderness.  Musculoskeletal:        General: No tenderness.     Right lower leg: No edema.     Left lower leg: No edema.  Skin:    General: Skin is warm and dry.  Neurological:     General: No focal deficit present.     Mental Status: He is alert and oriented to person, place, and time.  Psychiatric:        Mood and Affect: Mood normal.        Behavior: Behavior normal.        Thought Content: Thought content normal.     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 - saw cardiology Clayborn Bigness) 07/06/2019 - has only been taking entresto once  daily and doesn't think he's taking hydralazine because he says that he's not taking anything 3 times daily - explained that he needed to take entresto as 1 tablet twice daily - will not check BMP today since he's only been taking it daily and will plan on checking a BMP at his next visit - participating in paramedic program - BNP 07/10/2019 was 968.0  2: HTN- - BP elevated today but he's only been taking entresto once a day - saw PCP [name]) 06/20/2019 - BMP 07/12/2019 reviewed and showed sodium 137, potassium 3.9, creatinine 1.0 and GFR >60  3: DM- - A1c 05/28/2019 was 5.6% - he forgot to check his glucose today  Patient did not bring his medications nor a  list. Each medication was verbally reviewed with the patient and he was encouraged to bring the bottles to every visit to confirm accuracy of list. Took hydralazine off of his list and instructed him to verify the list with the bottles that he has at home and call us if there's anything incorrect.   Return in 1 month or sooner for any questions/problems before then.

## 2019-09-20 ENCOUNTER — Other Ambulatory Visit: Payer: Self-pay

## 2019-09-20 ENCOUNTER — Ambulatory Visit: Payer: 59 | Attending: Family | Admitting: Family

## 2019-09-20 ENCOUNTER — Encounter: Payer: Self-pay | Admitting: Family

## 2019-09-20 VITALS — BP 133/97 | HR 84 | Resp 18 | Ht 77.0 in | Wt 276.6 lb

## 2019-09-20 DIAGNOSIS — E119 Type 2 diabetes mellitus without complications: Secondary | ICD-10-CM | POA: Insufficient documentation

## 2019-09-20 DIAGNOSIS — Z79899 Other long term (current) drug therapy: Secondary | ICD-10-CM | POA: Insufficient documentation

## 2019-09-20 DIAGNOSIS — Z794 Long term (current) use of insulin: Secondary | ICD-10-CM | POA: Insufficient documentation

## 2019-09-20 DIAGNOSIS — I10 Essential (primary) hypertension: Secondary | ICD-10-CM

## 2019-09-20 DIAGNOSIS — E109 Type 1 diabetes mellitus without complications: Secondary | ICD-10-CM

## 2019-09-20 DIAGNOSIS — I509 Heart failure, unspecified: Secondary | ICD-10-CM | POA: Diagnosis present

## 2019-09-20 DIAGNOSIS — Z87891 Personal history of nicotine dependence: Secondary | ICD-10-CM | POA: Diagnosis not present

## 2019-09-20 DIAGNOSIS — I5022 Chronic systolic (congestive) heart failure: Secondary | ICD-10-CM

## 2019-09-20 DIAGNOSIS — I110 Hypertensive heart disease with heart failure: Secondary | ICD-10-CM | POA: Diagnosis not present

## 2019-09-20 NOTE — Patient Instructions (Signed)
Continue weighing daily and call for an overnight weight gain of > 2 pounds or a weekly weight gain of >5 pounds. 

## 2019-10-14 NOTE — Progress Notes (Signed)
Patient ID: Gary Bentley, male    DOB: 17-Nov-1971, 48 y.o.   MRN: [id number]  HPI  Gary Bentley is a 48 y/o male with a history of DM, HTN, previous tobacco use and chronic heart failure.   Echo report from 01/10/2019 reviewed and showed an EF of 15-20% along with trivial AR and mild pulmonic stenosis.   Catheterization done 01/13/2019 showed: Nonischemic cardiomyopathy, no significant CAD Mildly elevated wedge pressure,  EF severely depressed, global Hypokinesis, EF 20%  Admitted 07/10/2019 due to acute on chronic HF. Cardiology consult obtained. Initially given IV lasix and then transitioned to oral diuretics. Discharged after 2 days. Was in the ED 05/28/2019 due to fluid overloaded. Was being diuresed in the ED but patient left AMA prior to being admitted.   He presents today with a chief complaint of a follow-up visit. He specifically denies any difficulty sleeping, dizziness, cough, shortness of breath, chest pain, pedal edema, palpitations, abdominal distention or fatigue. He does report gradual weight gain because he says that he's "eating everything". He has been taking his entresto bid since he was last here and says that he hasn't been out of any of his medications but only has a couple of days worth left.   Past Medical History:  Diagnosis Date  . CHF (congestive heart failure) ([place])   . Diabetes mellitus without complication ([place])   . Hypertension    Past Surgical History:  Procedure Laterality Date  . FOOT SURGERY    . RIGHT/LEFT HEART CATH AND CORONARY ANGIOGRAPHY N/A 01/13/2019   Procedure: RIGHT/LEFT HEART CATH AND CORONARY ANGIOGRAPHY;  Surgeon: Minna Merritts, MD;  Location: [place] [facility];  Service: Cardiovascular;  Laterality: N/A;   Family History  Family history unknown: Yes   Social History   Tobacco Use  . Smoking status: Former Smoker    Packs/day: 1.00    Types: Cigarettes    Quit date: 04/17/2017    Years since quitting: 2.4  . Smokeless  tobacco: Never Used  Substance Use Topics  . Alcohol use: No   Allergies  Allergen Reactions  . Bee Venom Anaphylaxis    Prior to Admission medications   Medication Sig Start Date End Date Taking? Authorizing Provider  insulin aspart protamine - aspart (NOVOLOG MIX 70/30 FLEXPEN) (70-30) 100 UNIT/ML FlexPen Inject 0.1 mLs (10 Units total) into the skin 2 (two) times daily. 01/15/19  Yes Dustin Flock, MD  isosorbide mononitrate (IMDUR) 30 MG 24 hr tablet Take 30 mg by mouth daily.   Yes [provider]  metoprolol succinate (TOPROL-XL) 25 MG 24 hr tablet Take 0.5 tablets (12.5 mg total) by mouth daily. Patient taking differently: Take 25 mg by mouth daily.  07/12/19  Yes Epifanio Lesches, MD  potassium chloride SA (K-DUR) 20 MEQ tablet Take 20 mEq by mouth daily.   Yes [provider]  sacubitril-valsartan (ENTRESTO) 24-26 MG Take 1 tablet by mouth 2 (two) times daily. 08/25/19  Yes Gyanna Jarema, Otila Kluver A, FNP  furosemide (LASIX) 40 MG tablet Take 1 tablet (40 mg total) by mouth 2 (two) times daily. Patient not taking: Reported on 10/16/2019 08/17/19 08/16/20  Alisa Graff, FNP     Review of Systems  Constitutional: Negative for appetite change and fatigue.  HENT: Negative for congestion, postnasal drip and sore throat.   Eyes: Negative.   Respiratory: Negative for cough and shortness of breath.   Cardiovascular: Negative for chest pain, palpitations and leg swelling.  Gastrointestinal: Negative for abdominal distention and  abdominal pain.  Endocrine: Negative.   Genitourinary: Negative.   Musculoskeletal: Negative for back pain and neck pain.  Skin: Negative.   Allergic/Immunologic: Negative.   Neurological: Negative for dizziness and light-headedness.  Hematological: Negative for adenopathy. Does not bruise/bleed easily.  Psychiatric/Behavioral: Negative for dysphoric mood and sleep disturbance. The patient is not nervous/anxious.    Vitals:   10/16/19 1225   BP: (!) 150/97  Pulse: 87  Resp: 20  SpO2: 100%  Weight: 282 lb 3.2 oz (128 kg)  Height: 6\' 5"  (1.956 m)   Wt Readings from Last 3 Encounters:  10/16/19 282 lb 3.2 oz (128 kg)  09/20/19 276 lb 9.6 oz (125.5 kg)  08/25/19 269 lb 9.6 oz (122.3 kg)   Lab Results  Component Value Date   CREATININE 1.00 07/12/2019   CREATININE 1.08 07/11/2019   CREATININE 1.03 07/10/2019     Physical Exam Vitals signs and nursing note reviewed.  Constitutional:      Appearance: Normal appearance.  HENT:     Head: Normocephalic and atraumatic.  Neck:     Musculoskeletal: Normal range of motion and neck supple.  Cardiovascular:     Rate and Rhythm: Normal rate and regular rhythm.  Pulmonary:     Effort: Pulmonary effort is normal.     Breath sounds: No wheezing, rhonchi or rales.  Abdominal:     General: There is no distension.     Palpations: Abdomen is soft.     Tenderness: There is no abdominal tenderness.  Musculoskeletal:        General: No tenderness.     Right lower leg: No edema.     Left lower leg: No edema.  Skin:    General: Skin is warm and dry.  Neurological:     General: No focal deficit present.     Mental Status: He is alert and oriented to person, place, and time.  Psychiatric:        Mood and Affect: Mood normal.        Behavior: Behavior normal.        Thought Content: Thought content normal.     Assessment & Plan:  1: Chronic heart failure with reduced ejection fraction- - NYHA class I - euvolemic today - weighing daily and he says that he's had a gradual weight gain; reminded to call for an overnight weight gain of >2 pounds or a weekly weight gain of >5 pounds - weight up 13 pounds from last visit here 6 weeks ago; says that he's eating "everything". 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 Bentley) 07/06/2019 - will  check BMP today since he's now taking entresto BID - will increase his entresto to 49/51mg  BID & recheck BMP at next visit since titrating up entresto - participating in paramedic program and she has called in refills on his medications - BNP 07/10/2019 was 968.0 - does not take the flu vaccine; good handwashing encouraged  2: HTN- - BP mildly elevated today; increasing entresto per above - saw PCP Gary Bentley) 06/20/2019 - BMP 07/12/2019 reviewed and showed sodium 137, potassium 3.9, creatinine 1.0 and GFR >60  3: DM- - A1c 05/28/2019 was 5.6% - nonfasting glucose today was 160  Medication bottles were reviewed.   Return in 4 weeks or sooner for any questions/problems before then.

## 2019-10-16 ENCOUNTER — Telehealth (HOSPITAL_COMMUNITY): Payer: Self-pay

## 2019-10-16 ENCOUNTER — Other Ambulatory Visit: Payer: Self-pay

## 2019-10-16 ENCOUNTER — Encounter: Payer: Self-pay | Admitting: Family

## 2019-10-16 ENCOUNTER — Ambulatory Visit: Payer: 59 | Attending: Family | Admitting: Family

## 2019-10-16 VITALS — BP 150/97 | HR 87 | Resp 20 | Ht 77.0 in | Wt 282.2 lb

## 2019-10-16 DIAGNOSIS — Z794 Long term (current) use of insulin: Secondary | ICD-10-CM | POA: Diagnosis not present

## 2019-10-16 DIAGNOSIS — Z87891 Personal history of nicotine dependence: Secondary | ICD-10-CM | POA: Diagnosis not present

## 2019-10-16 DIAGNOSIS — Z79899 Other long term (current) drug therapy: Secondary | ICD-10-CM | POA: Insufficient documentation

## 2019-10-16 DIAGNOSIS — I5022 Chronic systolic (congestive) heart failure: Secondary | ICD-10-CM | POA: Diagnosis not present

## 2019-10-16 DIAGNOSIS — Z91030 Bee allergy status: Secondary | ICD-10-CM | POA: Diagnosis not present

## 2019-10-16 DIAGNOSIS — I509 Heart failure, unspecified: Secondary | ICD-10-CM | POA: Diagnosis present

## 2019-10-16 DIAGNOSIS — I110 Hypertensive heart disease with heart failure: Secondary | ICD-10-CM | POA: Insufficient documentation

## 2019-10-16 DIAGNOSIS — E119 Type 2 diabetes mellitus without complications: Secondary | ICD-10-CM | POA: Insufficient documentation

## 2019-10-16 DIAGNOSIS — I428 Other cardiomyopathies: Secondary | ICD-10-CM | POA: Insufficient documentation

## 2019-10-16 DIAGNOSIS — I10 Essential (primary) hypertension: Secondary | ICD-10-CM

## 2019-10-16 DIAGNOSIS — E109 Type 1 diabetes mellitus without complications: Secondary | ICD-10-CM

## 2019-10-16 LAB — BASIC METABOLIC PANEL
Anion gap: 9 (ref 5–15)
BUN: 14 mg/dL (ref 6–20)
CO2: 23 mmol/L (ref 22–32)
Calcium: 9.3 mg/dL (ref 8.9–10.3)
Chloride: 108 mmol/L (ref 98–111)
Creatinine, Ser: 1.05 mg/dL (ref 0.61–1.24)
GFR calc Af Amer: >60 mL/min (ref >60)
GFR calc non Af Amer: >60 mL/min (ref >60)
Glucose, Bld: 161 mg/dL — ABNORMAL HIGH (ref 70–99)
Potassium: 4 mmol/L (ref 3.5–5.1)
Sodium: 140 mmol/L (ref 135–145)

## 2019-10-16 LAB — GLUCOSE, CAPILLARY: Glucose-Capillary: 160 mg/dL — ABNORMAL HIGH (ref 70–99)

## 2019-10-16 MED ORDER — SACUBITRIL-VALSARTAN 49-51 MG PO TABS: 1.0000 | ORAL_TABLET | Freq: Two times a day (BID) | ORAL | 5 refills | Status: DC

## 2019-10-16 NOTE — Patient Instructions (Signed)
Continue weighing daily and call for an overnight weight gain of > 2 pounds or a weekly weight gain of >5 pounds. 

## 2019-10-16 NOTE — Telephone Encounter (Signed)
Spoke with Gary Bentley today, he states doing ok.  He is aware of appt today at HF clinic and says he will be there.  Asked if he had all his medications.  He states he ran out 2 days ago.  [place] and he has refills on all of them, they will have them ready shortly.  Advised him and he says he can go by and get them.  Will continue to visit for heart failure.   [place] [phone]

## 2019-11-09 ENCOUNTER — Telehealth (HOSPITAL_COMMUNITY): Payer: Self-pay

## 2019-11-09 NOTE — Telephone Encounter (Signed)
Today contacted Gary Bentley for a telephone appt.  He states doing well.  He states has all his medications and is taking them.  He is aware of appt with HF clinic next week and says he will be able to make it.  He denies any problems today.  Will continue to visit for Heart Failure and medications compliance.   [name] [phone]

## 2019-11-12 NOTE — Progress Notes (Signed)
Patient ID: Gary Bentley, male    DOB: 1971-08-21, 48 y.o.   MRN: [id number]  HPI  Mr Sadek is a 48 y/o male with a history of DM, HTN, previous tobacco use and chronic heart failure.   Echo report from 01/10/2019 reviewed and showed an EF of 15-20% along with trivial AR and mild pulmonic stenosis.   Catheterization done 01/13/2019 showed: Nonischemic cardiomyopathy, no significant CAD Mildly elevated wedge pressure,  EF severely depressed, global Hypokinesis, EF 20%  Admitted 07/10/2019 due to acute on chronic HF. Cardiology consult obtained. Initially given IV lasix and then transitioned to oral diuretics. Discharged after 2 days. Was in the ED 05/28/2019 due to fluid overloaded. Was being diuresed in the ED but patient left AMA prior to being admitted.   He presents today with a chief complaint of a follow-up visit. He currently doesn't have any symptoms and specifically denies any difficulty sleeping, abdominal distention, palpitations, pedal edema, chest pain, shortness of breath, cough, dizziness, fatigue or weight gain.   Delene Loll was increased at his last visit and he says that he hasn't noticed any difficulty with the increased dose.   Past Medical History:  Diagnosis Date  . CHF (congestive heart failure) ([place])   . Diabetes mellitus without complication ([place])   . Hypertension    Past Surgical History:  Procedure Laterality Date  . FOOT SURGERY    . RIGHT/LEFT HEART CATH AND CORONARY ANGIOGRAPHY N/A 01/13/2019   Procedure: RIGHT/LEFT HEART CATH AND CORONARY ANGIOGRAPHY;  Surgeon: Minna Merritts, MD;  Location: [facility];  Service: Cardiovascular;  Laterality: N/A;   Family History  Family history unknown: Yes   Social History   Tobacco Use  . Smoking status: Former Smoker    Packs/day: 1.00    Types: Cigarettes    Quit date: 04/17/2017    Years since quitting: 2.5  . Smokeless tobacco: Never Used  Substance Use Topics  . Alcohol use: No   Allergies   Allergen Reactions  . Bee Venom Anaphylaxis   Prior to Admission medications   Medication Sig Start Date End Date Taking? Authorizing Provider  furosemide (LASIX) 40 MG tablet Take 1 tablet (40 mg total) by mouth 2 (two) times daily. 08/17/19 08/16/20 Yes Giulia Hickey, Otila Kluver A, FNP  insulin aspart protamine - aspart (NOVOLOG MIX 70/30 FLEXPEN) (70-30) 100 UNIT/ML FlexPen Inject 0.1 mLs (10 Units total) into the skin 2 (two) times daily. 01/15/19  Yes Dustin Flock, MD  isosorbide mononitrate (IMDUR) 30 MG 24 hr tablet Take 30 mg by mouth daily.   Yes [provider]  metoprolol succinate (TOPROL-XL) 25 MG 24 hr tablet Take 0.5 tablets (12.5 mg total) by mouth daily. Patient taking differently: Take 25 mg by mouth daily.  07/12/19  Yes Epifanio Lesches, MD  potassium chloride SA (K-DUR) 20 MEQ tablet Take 20 mEq by mouth daily.   Yes [provider]  sacubitril-valsartan (ENTRESTO) 49-51 MG Take 1 tablet by mouth 2 (two) times daily. 10/16/19  Yes Alisa Graff, FNP    Review of Systems  Constitutional: Negative for appetite change and fatigue.  HENT: Negative for congestion, postnasal drip and sore throat.   Eyes: Negative.   Respiratory: Negative for cough and shortness of breath.   Cardiovascular: Negative for chest pain, palpitations and leg swelling.  Gastrointestinal: Negative for abdominal distention and abdominal pain.  Endocrine: Negative.   Genitourinary: Negative.   Musculoskeletal: Negative for back pain and neck pain.  Skin: Negative.  Allergic/Immunologic: Negative.   Neurological: Negative for dizziness and light-headedness.  Hematological: Negative for adenopathy. Does not bruise/bleed easily.  Psychiatric/Behavioral: Negative for dysphoric mood and sleep disturbance. The patient is not nervous/anxious.    Vitals:   11/13/19 1158  BP: (!) 128/91  Pulse: 96  Resp: 20  SpO2: 100%  Weight: 278 lb 12.8 oz (126.5 kg)  Height: 6\' 5"  (1.956 m)   Wt  Readings from Last 3 Encounters:  11/13/19 278 lb 12.8 oz (126.5 kg)  10/16/19 282 lb 3.2 oz (128 kg)  09/20/19 276 lb 9.6 oz (125.5 kg)   Lab Results  Component Value Date   CREATININE 1.05 10/16/2019   CREATININE 1.00 07/12/2019   CREATININE 1.08 07/11/2019    Physical Exam Vitals and nursing note reviewed.  Constitutional:      Appearance: Normal appearance.  HENT:     Head: Normocephalic and atraumatic.  Cardiovascular:     Rate and Rhythm: Normal rate and regular rhythm.  Pulmonary:     Effort: Pulmonary effort is normal.     Breath sounds: No wheezing, rhonchi or rales.  Abdominal:     General: There is no distension.     Palpations: Abdomen is soft.     Tenderness: There is no abdominal tenderness.  Musculoskeletal:        General: No tenderness.     Cervical back: Normal range of motion and neck supple.     Right lower leg: No edema.     Left lower leg: No edema.  Skin:    General: Skin is warm and dry.  Neurological:     General: No focal deficit present.     Mental Status: He is alert and oriented to person, place, and time.  Psychiatric:        Mood and Affect: Mood normal.        Behavior: Behavior normal.        Thought Content: Thought content normal.     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 Bigness) 07/06/2019 - entresto increased at his last visit to 49/51mg  so will check BMP today - participating in paramedicine program  - will increase his metoprolol to 25mg  daily; instructed him to start taking a whole tablet daily instead of 1/2  tablet - BNP 07/10/2019 was 968.0 - does not take the flu vaccine; good handwashing encouraged  2: HTN- - BP looks good today - saw PCP [name]) 06/20/2019 & says that he returns January 2021 - BMP 10/16/2019 reviewed and showed sodium 140, potassium 4.0, creatinine 1.05 and GFR >60  3: DM- - A1c 05/28/2019 was 5.6% - nonfasting glucose today in clinic was 192; he says that he ate at [facility] this morning  Patient did not bring his medications nor a list. Each medication was verbally reviewed with the patient and he was encouraged to bring the bottles to every visit to confirm accuracy of list.   Return in 2 months or sooner for any questions/problems before then.

## 2019-11-13 ENCOUNTER — Ambulatory Visit: Payer: Self-pay | Attending: Family | Admitting: Family

## 2019-11-13 ENCOUNTER — Other Ambulatory Visit: Payer: Self-pay

## 2019-11-13 ENCOUNTER — Encounter: Payer: Self-pay | Admitting: Family

## 2019-11-13 VITALS — BP 128/91 | HR 96 | Resp 20 | Ht 77.0 in | Wt 278.8 lb

## 2019-11-13 DIAGNOSIS — I428 Other cardiomyopathies: Secondary | ICD-10-CM | POA: Insufficient documentation

## 2019-11-13 DIAGNOSIS — E109 Type 1 diabetes mellitus without complications: Secondary | ICD-10-CM

## 2019-11-13 DIAGNOSIS — Z79899 Other long term (current) drug therapy: Secondary | ICD-10-CM | POA: Insufficient documentation

## 2019-11-13 DIAGNOSIS — I110 Hypertensive heart disease with heart failure: Secondary | ICD-10-CM | POA: Insufficient documentation

## 2019-11-13 DIAGNOSIS — Z87891 Personal history of nicotine dependence: Secondary | ICD-10-CM | POA: Insufficient documentation

## 2019-11-13 DIAGNOSIS — I5022 Chronic systolic (congestive) heart failure: Secondary | ICD-10-CM | POA: Insufficient documentation

## 2019-11-13 DIAGNOSIS — E119 Type 2 diabetes mellitus without complications: Secondary | ICD-10-CM | POA: Insufficient documentation

## 2019-11-13 DIAGNOSIS — I10 Essential (primary) hypertension: Secondary | ICD-10-CM

## 2019-11-13 DIAGNOSIS — Z794 Long term (current) use of insulin: Secondary | ICD-10-CM | POA: Insufficient documentation

## 2019-11-13 LAB — BASIC METABOLIC PANEL
Anion gap: 9 (ref 5–15)
BUN: 17 mg/dL (ref 6–20)
CO2: 24 mmol/L (ref 22–32)
Calcium: 8.9 mg/dL (ref 8.9–10.3)
Chloride: 105 mmol/L (ref 98–111)
Creatinine, Ser: 1.04 mg/dL (ref 0.61–1.24)
GFR calc Af Amer: >60 mL/min (ref >60)
GFR calc non Af Amer: >60 mL/min (ref >60)
Glucose, Bld: 207 mg/dL — ABNORMAL HIGH (ref 70–99)
Potassium: 3.9 mmol/L (ref 3.5–5.1)
Sodium: 138 mmol/L (ref 135–145)

## 2019-11-13 LAB — GLUCOSE, CAPILLARY: Glucose-Capillary: 192 mg/dL — ABNORMAL HIGH (ref 70–99)

## 2019-11-13 NOTE — Patient Instructions (Addendum)
Continue weighing daily and call for an overnight weight gain of > 2 pounds or a weekly weight gain of >5 pounds.  Increase your metoprolol by taking 1 whole tablet once daily (instead of taking 1/2 tablet)

## 2019-12-04 ENCOUNTER — Telehealth (HOSPITAL_COMMUNITY): Payer: Self-pay

## 2019-12-04 NOTE — Telephone Encounter (Signed)
Today Gary Bentley contacted me to see if I would call his refills in for his medications to [facility].  He will be out tomorrow.  Contacted [facility] for him and they will get them ready.  Did advise him he needs to bring new insurance card, he appears to understand.  He states been doing well.  Verified his medications.  Will continue to visit for Heart Failure.   [place] [phone]

## 2020-01-04 ENCOUNTER — Other Ambulatory Visit
Admission: RE | Admit: 2020-01-04 | Discharge: 2020-01-04 | Disposition: A | Discharge status: Home / Self Care | Payer: BC Managed Care – PPO | Source: Ambulatory Visit | Attending: Podiatry | Admitting: Podiatry

## 2020-01-04 ENCOUNTER — Other Ambulatory Visit: Payer: Self-pay

## 2020-01-04 ENCOUNTER — Other Ambulatory Visit: Payer: Self-pay | Admitting: Podiatry

## 2020-01-04 DIAGNOSIS — Z01812 Encounter for preprocedural laboratory examination: Secondary | ICD-10-CM | POA: Diagnosis not present

## 2020-01-04 DIAGNOSIS — Z20822 Contact with and (suspected) exposure to covid-19: Secondary | ICD-10-CM | POA: Insufficient documentation

## 2020-01-04 LAB — SARS CORONAVIRUS 2 (TAT 6-24 HRS): SARS Coronavirus 2: NEGATIVE

## 2020-01-05 ENCOUNTER — Ambulatory Visit
Admission: RE | Admit: 2020-01-05 | Discharge: 2020-01-05 | Disposition: A | Discharge status: Home / Self Care | Payer: BC Managed Care – PPO | Attending: Podiatry | Admitting: Podiatry

## 2020-01-05 ENCOUNTER — Other Ambulatory Visit: Payer: Self-pay

## 2020-01-05 ENCOUNTER — Ambulatory Visit: Payer: BC Managed Care – PPO | Admitting: Anesthesiology

## 2020-01-05 ENCOUNTER — Encounter
Admission: RE | Disposition: A | Discharge status: Home / Self Care | Payer: Self-pay | Source: Home / Self Care | Attending: Podiatry

## 2020-01-05 ENCOUNTER — Encounter: Payer: Self-pay | Admitting: Podiatry

## 2020-01-05 DIAGNOSIS — I110 Hypertensive heart disease with heart failure: Secondary | ICD-10-CM | POA: Insufficient documentation

## 2020-01-05 DIAGNOSIS — E1142 Type 2 diabetes mellitus with diabetic polyneuropathy: Secondary | ICD-10-CM | POA: Diagnosis not present

## 2020-01-05 DIAGNOSIS — Z794 Long term (current) use of insulin: Secondary | ICD-10-CM | POA: Diagnosis not present

## 2020-01-05 DIAGNOSIS — M86172 Other acute osteomyelitis, left ankle and foot: Secondary | ICD-10-CM | POA: Diagnosis not present

## 2020-01-05 DIAGNOSIS — I509 Heart failure, unspecified: Secondary | ICD-10-CM | POA: Diagnosis not present

## 2020-01-05 DIAGNOSIS — E1169 Type 2 diabetes mellitus with other specified complication: Secondary | ICD-10-CM | POA: Insufficient documentation

## 2020-01-05 DIAGNOSIS — L97524 Non-pressure chronic ulcer of other part of left foot with necrosis of bone: Secondary | ICD-10-CM | POA: Insufficient documentation

## 2020-01-05 DIAGNOSIS — E11621 Type 2 diabetes mellitus with foot ulcer: Secondary | ICD-10-CM | POA: Insufficient documentation

## 2020-01-05 DIAGNOSIS — Z87891 Personal history of nicotine dependence: Secondary | ICD-10-CM | POA: Diagnosis not present

## 2020-01-05 DIAGNOSIS — Z79899 Other long term (current) drug therapy: Secondary | ICD-10-CM | POA: Insufficient documentation

## 2020-01-05 HISTORY — PX: AMPUTATION TOE: SHX6595

## 2020-01-05 LAB — GLUCOSE, CAPILLARY
Glucose-Capillary: 311 mg/dL — ABNORMAL HIGH (ref 70–99)
Glucose-Capillary: 271 mg/dL — ABNORMAL HIGH (ref 70–99)
Glucose-Capillary: 248 mg/dL — ABNORMAL HIGH (ref 70–99)

## 2020-01-05 LAB — POCT I-STAT, CHEM 8
BUN: 17 mg/dL (ref 6–20)
Calcium, Ion: 1.2 mmol/L (ref 1.15–1.40)
Chloride: 103 mmol/L (ref 98–111)
Creatinine, Ser: 1 mg/dL (ref 0.61–1.24)
Glucose, Bld: 330 mg/dL — ABNORMAL HIGH (ref 70–99)
HCT: 38 % — ABNORMAL LOW (ref 39.0–52.0)
Hemoglobin: 12.9 g/dL — ABNORMAL LOW (ref 13.0–17.0)
Potassium: 4.3 mmol/L (ref 3.5–5.1)
Sodium: 135 mmol/L (ref 135–145)
TCO2: 23 mmol/L (ref 22–32)

## 2020-01-05 SURGERY — AMPUTATION, TOE
Anesthesia: General | Site: Toe | Laterality: Left

## 2020-01-05 MED ORDER — PROPOFOL 10 MG/ML IV BOLUS
INTRAVENOUS | Status: DC | PRN
  Administered 2020-01-05: 30 mg via INTRAVENOUS

## 2020-01-05 MED ORDER — ONDANSETRON HCL 4 MG/2ML IJ SOLN: 4.0000 mg | Freq: Four times a day (QID) | INTRAMUSCULAR | Status: DC | PRN

## 2020-01-05 MED ORDER — LIDOCAINE HCL (PF) 1 % IJ SOLN
INTRAMUSCULAR | Status: AC
  Filled 2020-01-05: qty 30

## 2020-01-05 MED ORDER — BUPIVACAINE HCL (PF) 0.5 % IJ SOLN
INTRAMUSCULAR | Status: AC
  Filled 2020-01-05: qty 30

## 2020-01-05 MED ORDER — CEFAZOLIN SODIUM-DEXTROSE 2-4 GM/100ML-% IV SOLN
INTRAVENOUS | Status: AC
  Filled 2020-01-05: qty 100

## 2020-01-05 MED ORDER — PROPOFOL 10 MG/ML IV BOLUS
INTRAVENOUS | Status: AC
  Filled 2020-01-05: qty 20

## 2020-01-05 MED ORDER — INSULIN REGULAR HUMAN 100 UNIT/ML IJ SOLN
7.0000 [IU] | Freq: Once | INTRAMUSCULAR | Status: DC
  Filled 2020-01-05: qty 3

## 2020-01-05 MED ORDER — SODIUM CHLORIDE 0.9 % IV SOLN: INTRAVENOUS | Status: DC

## 2020-01-05 MED ORDER — POVIDONE-IODINE 7.5 % EX SOLN
Freq: Once | CUTANEOUS | Status: DC
  Filled 2020-01-05: qty 118

## 2020-01-05 MED ORDER — MIDAZOLAM HCL 2 MG/2ML IJ SOLN
INTRAMUSCULAR | Status: AC
  Filled 2020-01-05: qty 2

## 2020-01-05 MED ORDER — INSULIN ASPART 100 UNIT/ML ~~LOC~~ SOLN: 7.0000 [IU] | Freq: Once | SUBCUTANEOUS | Status: AC

## 2020-01-05 MED ORDER — FAMOTIDINE 20 MG PO TABS
ORAL_TABLET | ORAL | Status: AC
  Administered 2020-01-05: 20 mg via ORAL
  Filled 2020-01-05: qty 1

## 2020-01-05 MED ORDER — ONDANSETRON HCL 4 MG PO TABS: 4.0000 mg | ORAL_TABLET | Freq: Four times a day (QID) | ORAL | Status: DC | PRN

## 2020-01-05 MED ORDER — OXYCODONE HCL 5 MG/5ML PO SOLN: 5.0000 mg | Freq: Once | ORAL | Status: DC | PRN

## 2020-01-05 MED ORDER — LIDOCAINE HCL (CARDIAC) PF 100 MG/5ML IV SOSY
PREFILLED_SYRINGE | INTRAVENOUS | Status: DC | PRN
  Administered 2020-01-05: 100 mg via INTRATRACHEAL

## 2020-01-05 MED ORDER — LIDOCAINE HCL (PF) 1 % IJ SOLN
INTRAMUSCULAR | Status: DC | PRN
  Administered 2020-01-05: 5 mL

## 2020-01-05 MED ORDER — LIDOCAINE HCL (PF) 2 % IJ SOLN
INTRAMUSCULAR | Status: AC
  Filled 2020-01-05: qty 10

## 2020-01-05 MED ORDER — MIDAZOLAM HCL 2 MG/2ML IJ SOLN
INTRAMUSCULAR | Status: DC | PRN
  Administered 2020-01-05: 2 mg via INTRAVENOUS

## 2020-01-05 MED ORDER — KETAMINE HCL 10 MG/ML IJ SOLN
INTRAMUSCULAR | Status: DC | PRN
  Administered 2020-01-05: 50 mg via INTRAVENOUS

## 2020-01-05 MED ORDER — INSULIN ASPART 100 UNIT/ML ~~LOC~~ SOLN
SUBCUTANEOUS | Status: AC
  Administered 2020-01-05: 14:00:00 7 [IU] via SUBCUTANEOUS
  Filled 2020-01-05: qty 1

## 2020-01-05 MED ORDER — OXYCODONE HCL 5 MG PO TABS: 5.0000 mg | ORAL_TABLET | Freq: Once | ORAL | Status: DC | PRN

## 2020-01-05 MED ORDER — BUPIVACAINE HCL 0.5 % IJ SOLN
INTRAMUSCULAR | Status: DC | PRN
  Administered 2020-01-05: 10 mL
  Administered 2020-01-05: 5 mL

## 2020-01-05 MED ORDER — FENTANYL CITRATE (PF) 100 MCG/2ML IJ SOLN: 25.0000 ug | INTRAMUSCULAR | Status: DC | PRN

## 2020-01-05 MED ORDER — PROPOFOL 500 MG/50ML IV EMUL
INTRAVENOUS | Status: DC | PRN
  Administered 2020-01-05: 40 ug/kg/min via INTRAVENOUS

## 2020-01-05 MED ORDER — CEFAZOLIN SODIUM-DEXTROSE 2-4 GM/100ML-% IV SOLN
2.0000 g | INTRAVENOUS | Status: AC
  Administered 2020-01-05: 15:00:00 2 g via INTRAVENOUS

## 2020-01-05 MED ORDER — FAMOTIDINE 20 MG PO TABS: 20.0000 mg | ORAL_TABLET | Freq: Once | ORAL | Status: AC

## 2020-01-05 MED ORDER — OXYCODONE-ACETAMINOPHEN 5-325 MG PO TABS: 1.0000 | ORAL_TABLET | Freq: Three times a day (TID) | ORAL | 0 refills | Status: DC | PRN

## 2020-01-05 SURGICAL SUPPLY — 45 items
BLADE OSC/SAGITTAL MD 5.5X18 (BLADE) ×2 IMPLANT
BLADE SURG MINI STRL (BLADE) ×2 IMPLANT
BNDG CONFORM 2 STRL LF (GAUZE/BANDAGES/DRESSINGS) ×2 IMPLANT
BNDG CONFORM 3 STRL LF (GAUZE/BANDAGES/DRESSINGS) ×4 IMPLANT
BNDG ELASTIC 4X5.8 VLCR NS LF (GAUZE/BANDAGES/DRESSINGS) ×2 IMPLANT
BNDG ESMARK 4X12 TAN STRL LF (GAUZE/BANDAGES/DRESSINGS) ×2 IMPLANT
BNDG GAUZE 4.5X4.1 6PLY STRL (MISCELLANEOUS) ×1 IMPLANT
CANISTER SUCT 1200ML W/VALVE (MISCELLANEOUS) ×2 IMPLANT
COVER WAND RF STERILE (DRAPES) ×2 IMPLANT
CUFF TOURN SGL QUICK 12 (TOURNIQUET CUFF) IMPLANT
CUFF TOURN SGL QUICK 18X4 (TOURNIQUET CUFF) IMPLANT
DRAPE FLUOR MINI C-ARM 54X84 (DRAPES) ×1 IMPLANT
DRAPE XRAY CASSETTE 23X24 (DRAPES) ×1 IMPLANT
DURAPREP 26ML APPLICATOR (WOUND CARE) ×2 IMPLANT
ELECT REM PT RETURN 9FT ADLT (ELECTROSURGICAL) ×2
ELECTRODE REM PT RTRN 9FT ADLT (ELECTROSURGICAL) ×1 IMPLANT
GAUZE PACKING IODOFORM 1/2 (PACKING) ×2 IMPLANT
GAUZE SPONGE 4X4 12PLY STRL (GAUZE/BANDAGES/DRESSINGS) ×2 IMPLANT
GAUZE XEROFORM 1X8 LF (GAUZE/BANDAGES/DRESSINGS) ×2 IMPLANT
GLOVE BIO SURGEON STRL SZ7.5 (GLOVE) ×3 IMPLANT
GLOVE INDICATOR 8.0 STRL GRN (GLOVE) ×3 IMPLANT
GOWN STRL REUS W/ TWL LRG LVL3 (GOWN DISPOSABLE) ×2 IMPLANT
GOWN STRL REUS W/TWL LRG LVL3 (GOWN DISPOSABLE) ×2
KIT TURNOVER KIT A (KITS) ×2 IMPLANT
LABEL OR SOLS (LABEL) ×2 IMPLANT
NDL FILTER BLUNT 18X1 1/2 (NEEDLE) ×1 IMPLANT
NDL HYPO 25X1 1.5 SAFETY (NEEDLE) ×1 IMPLANT
NEEDLE FILTER BLUNT 18X 1/2SAF (NEEDLE)
NEEDLE FILTER BLUNT 18X1 1/2 (NEEDLE) IMPLANT
NEEDLE HYPO 25X1 1.5 SAFETY (NEEDLE) ×2 IMPLANT
NS IRRIG 500ML POUR BTL (IV SOLUTION) ×2 IMPLANT
PACK EXTREMITY ARMC (MISCELLANEOUS) ×2 IMPLANT
PAD ABD DERMACEA PRESS 5X9 (GAUZE/BANDAGES/DRESSINGS) ×2 IMPLANT
PULSAVAC PLUS IRRIG FAN TIP (DISPOSABLE) ×2
SHIELD FULL FACE ANTIFOG 7M (MISCELLANEOUS) ×2 IMPLANT
SOL .9 NS 3000ML IRR  AL (IV SOLUTION) ×1
SOL .9 NS 3000ML IRR UROMATIC (IV SOLUTION) ×1 IMPLANT
STOCKINETTE M/LG 89821 (MISCELLANEOUS) ×2 IMPLANT
STRAP SAFETY 5IN WIDE (MISCELLANEOUS) ×2 IMPLANT
SUT ETHILON 3-0 FS-10 30 BLK (SUTURE) ×4
SUT ETHILON 5-0 FS-2 18 BLK (SUTURE) ×1 IMPLANT
SUT VIC AB 4-0 FS2 27 (SUTURE) ×2 IMPLANT
SUTURE EHLN 3-0 FS-10 30 BLK (SUTURE) ×1 IMPLANT
SYR 10ML LL (SYRINGE) ×6 IMPLANT
TIP FAN IRRIG PULSAVAC PLUS (DISPOSABLE) ×1 IMPLANT

## 2020-01-05 NOTE — H&P (Signed)
HISTORY AND PHYSICAL INTERVAL NOTE:  01/05/2020  2:11 PM  Gary Bentley  has presented today for surgery, with the diagnosis of E11.42 DIABETES MELLITUS L97.526, E11.621  DIABETIC ULCER LEFT FOOT.  The various methods of treatment have been discussed with the patient.  No guarantees were given.  After consideration of risks, benefits and other options for treatment, the patient has consented to surgery.  I have reviewed the patients' chart and labs.     A history and physical examination was performed in my office.  The patient was reexamined.  There have been no changes to this history and physical examination.  Samara Deist A

## 2020-01-05 NOTE — Op Note (Signed)
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 that presents today for surgical intervention.  The risks/benefits/alternatives/complications have been discussed and consent has been given.    Procedure:  Patient was brought into the OR and placed on the operating table in thesupine position. After anesthesia was obtained theleft lower extremity was prepped and draped in usual sterile fashion.  Attention was directed to the distal left great toe where fishmouth incision was performed.  Full-thickness flaps were performed dorsal and plantar.  Dissection was carried back to the level of the metatarsophalangeal joint.  The toe was then disarticulated at the metatarsophalangeal joint.  A sample of bone was sent for culture and the remaining toe was sent for pathological evaluation.  The proximal margin was inked.  The wound was then flushed with copious amounts of irrigation.  The tourniquet was deflated.  Bleeders were Bovie cauterized.  Closure was performed with a 3-0 nylon.  A bulky sterile dressing was applied.  Local anesthetic was used around the distal incision sites.      Patient tolerated the procedure and anesthesia well.  Was transported from the OR to the PACU with all vital signs stable and vascular status intact. To be discharged per routine protocol.  Will follow up in approximately 1 week in the outpatient clinic.  A prescription for Percocet was sent to his pharmacy.

## 2020-01-05 NOTE — Anesthesia Preprocedure Evaluation (Signed)
Anesthesia Evaluation  Patient identified by MRN, date of birth, ID band Patient awake    Reviewed: Allergy & Precautions, H&P , NPO status , Patient's Chart, lab work & pertinent test results  History of Anesthesia Complications Negative for: history of anesthetic complications  Airway Mallampati: III  TM Distance: >3 FB Neck ROM: limited    Dental  (+) Chipped, Poor Dentition   Pulmonary neg shortness of breath, former smoker,           Cardiovascular Exercise Tolerance: Good hypertension, (-) angina+CHF (EF 15%)  (-) DOE      Neuro/Psych negative neurological ROS  negative psych ROS   GI/Hepatic negative GI ROS, Neg liver ROS,   Endo/Other  diabetes, Type 2, Insulin Dependent  Renal/GU negative Renal ROS  negative genitourinary   Musculoskeletal   Abdominal   Peds  Hematology negative hematology ROS (+)   Anesthesia Other Findings Past Medical History: No date: CHF (congestive heart failure) ([facility]) No date: Diabetes mellitus without complication ([facility]) No date: Hypertension  Past Surgical History: No date: FOOT SURGERY 01/13/2019: RIGHT/LEFT HEART CATH AND CORONARY ANGIOGRAPHY; N/A     Comment:  Procedure: RIGHT/LEFT HEART CATH AND CORONARY               ANGIOGRAPHY;  Surgeon: Minna Merritts, MD;  Location:               [facility];  Service: Cardiovascular;                Laterality: N/A;  BMI    Body Mass Index: 30.48 kg/m      Reproductive/Obstetrics negative OB ROS                             Anesthesia Physical Anesthesia Plan  ASA: IV  Anesthesia Plan: General   Post-op Pain Management:    Induction: Intravenous  PONV Risk Score and Plan: Propofol infusion and TIVA  Airway Management Planned: Natural Airway and Nasal Cannula  Additional Equipment:   Intra-op Plan:   Post-operative Plan:   Informed Consent: I have reviewed the patients  History and Physical, chart, labs and discussed the procedure including the risks, benefits and alternatives for the proposed anesthesia with the patient or authorized representative who has indicated his/her understanding and acceptance.     Dental Advisory Given  Plan Discussed with: Anesthesiologist, CRNA and Surgeon  Anesthesia Plan Comments: (Patient informed that they are higher risk for complications from anesthesia during this procedure due to their medical history.  Patient voiced understanding.  Dr. Vickki Muff plans to do local in the patients foot PRN.  If needed, I have also consented the patient for a PNB  Patient consented for risks of anesthesia including but not limited to:  - adverse reactions to medications - risk of intubation if required - damage to teeth, lips or other oral mucosa - sore throat or hoarseness - Damage to heart, brain, lungs or loss of life  Patient voiced understanding.)        Anesthesia Quick Evaluation

## 2020-01-05 NOTE — Discharge Instructions (Signed)
AMBULATORY SURGERY  DISCHARGE INSTRUCTIONS   1) The drugs that you were given will stay in your system until tomorrow so for the next 24 hours you should not:  A) Drive an automobile B) Make any legal decisions C) Drink any alcoholic beverage   2) You may resume regular meals tomorrow.  Today it is better to start with liquids and gradually work up to solid foods.  You may eat anything you prefer, but it is better to start with liquids, then soup and crackers, and gradually work up to solid foods.   3) Please notify your doctor immediately if you have any unusual bleeding, trouble breathing, redness and pain at the surgery site, drainage, fever, or pain not relieved by medication.    4) Additional Instructions:        Please contact your physician with any problems or Same Day Surgery at [phone], Monday through Friday 6 am to 4 pm, or [place] at [facility] number at [phone].[place] DR. TROXLER, DR. Vickki Muff, AND DR. [place]   1. Take your medication as prescribed.  Pain medication should be taken only as needed.  2. Keep the dressing clean, dry and intact.  If dressing becomes soiled okay to change with fresh gauze bandage.  3. Keep your foot elevated above the heart level for the first 48 hours.  4. Walking to the bathroom and brief periods of walking are acceptable, unless we have instructed you to be non-weight bearing.  5. Always wear your post-op shoe when walking.  Always use your crutches if you are to be non-weight bearing.  6. Do not take a shower. Baths are permissible as long as the foot is kept out of the water.   7. Every hour you are awake:  - Bend your knee 15 times. - Flex foot 15 times - Massage calf 15 times  8. Call [facility] [phone]) if any of the following problems occur: - You develop a temperature or  fever. - The bandage becomes saturated with blood. - Medication does not stop your pain. - Injury of the foot occurs. - Any symptoms of infection including redness, odor, or red streaks running from wound.

## 2020-01-05 NOTE — Transfer of Care (Signed)
Immediate Anesthesia Transfer of Care Note  Patient: Gary Bentley  Procedure(s) Performed: AMPUTATION TOE MPJ LEFT (Left Toe)  Patient Location: PACU  Anesthesia Type:General  Level of Consciousness: drowsy  Airway & Oxygen Therapy: Patient Spontanous Breathing and Patient connected to face mask oxygen  Post-op Assessment: Report given to RN and Post -op Vital signs reviewed and stable  Post vital signs: Reviewed and stable  Last Vitals:  Vitals Value Taken Time  BP 141/97 01/05/20 1524  Temp    Pulse 71 01/05/20 1527  Resp 23 01/05/20 1527  SpO2 100 % 01/05/20 1527  Vitals shown include unvalidated device data.  Last Pain:  Vitals:   01/05/20 1256  TempSrc: Temporal  PainSc: 0-No pain         Complications: No apparent anesthesia complications

## 2020-01-08 ENCOUNTER — Other Ambulatory Visit: Payer: Self-pay | Admitting: Family

## 2020-01-08 ENCOUNTER — Telehealth (HOSPITAL_COMMUNITY): Payer: Self-pay

## 2020-01-08 MED ORDER — ISOSORBIDE MONONITRATE ER 30 MG PO TB24: 30.0000 mg | ORAL_TABLET | Freq: Every day | ORAL | 3 refills | Status: DC

## 2020-01-08 NOTE — Telephone Encounter (Signed)
Gary Bentley states he is doing ok.  He had surgery to remove his toe on Friday.  He states healing now.  He is out of work for a month.  He wants me to call in his refills, contacted Darylene Price to send in refill for isosorbide.  He states will pick them up today.  He denies any chest pain, shortness of breath, headaches or dizziness.  He is trying to watch his diet and fluids.  He stays active working, will be off a month to heal.  Will continue to visit for heart failure.   [place] [phone]

## 2020-01-09 LAB — SURGICAL PATHOLOGY

## 2020-01-09 NOTE — Anesthesia Postprocedure Evaluation (Signed)
Anesthesia Post Note  Patient: Gary Bentley  Procedure(s) Performed: AMPUTATION TOE MPJ LEFT (Left Toe)  Patient location during evaluation: PACU Anesthesia Type: General Level of consciousness: awake and alert Pain management: pain level controlled Vital Signs Assessment: post-procedure vital signs reviewed and stable Respiratory status: spontaneous breathing, nonlabored ventilation, respiratory function stable and patient connected to nasal cannula oxygen Cardiovascular status: blood pressure returned to baseline and stable Postop Assessment: no apparent nausea or vomiting Anesthetic complications: no     Last Vitals:  Vitals:   01/05/20 1600 01/05/20 1613  BP:  134/84  Pulse: 68 76  Resp: (!) 23 20  Temp: (!) 36.1 C 36.7 C  SpO2: 98% 100%    Last Pain:  Vitals:   01/05/20 1613  TempSrc: Oral  PainSc: 0-No pain                 Molli Barrows

## 2020-01-11 LAB — AEROBIC/ANAEROBIC CULTURE W GRAM STAIN (SURGICAL/DEEP WOUND)

## 2020-01-14 NOTE — Progress Notes (Signed)
Patient ID: Gary Bentley, male    DOB: [birth year], 49 y.o.   MRN: [id number]  HPI  Mr Roelofs is a 49 y/o male with a history of DM, HTN, previous tobacco use and chronic heart failure.   Echo report from 01/10/2019 reviewed and showed an EF of 15-20% along with trivial AR and mild pulmonic stenosis.   Catheterization done 01/13/2019 showed: Nonischemic cardiomyopathy, no significant CAD Mildly elevated wedge pressure,  EF severely depressed, global Hypokinesis, EF 20%  Admitted 07/10/2019 due to acute on chronic HF. Cardiology consult obtained. Initially given IV lasix and then transitioned to oral diuretics. Discharged after 2 days.   He presents today with a chief complaint of a follow-up visit. He currently doesn't endorse any symptoms and specifically denies any difficulty sleeping, dizziness, abdominal distention, palpitations, pedal edema, chest pain, shortness of breath, cough, fatigue or weight gain.   2 weeks ago he had his left big toe amputated and is currently wearing a boot on that foot. Denies any pain in that foot.   Past Medical History:  Diagnosis Date  . CHF (congestive heart failure) ([place])   . Diabetes mellitus without complication ([place])   . Hypertension    Past Surgical History:  Procedure Laterality Date  . AMPUTATION TOE Left 01/05/2020   Procedure: AMPUTATION TOE MPJ LEFT;  Surgeon: Samara Deist, DPM;  Location: [facility];  Service: Podiatry;  Laterality: Left;  . FOOT SURGERY    . RIGHT/LEFT HEART CATH AND CORONARY ANGIOGRAPHY N/A 01/13/2019   Procedure: RIGHT/LEFT HEART CATH AND CORONARY ANGIOGRAPHY;  Surgeon: Minna Merritts, MD;  Location: [place] [facility];  Service: Cardiovascular;  Laterality: N/A;   Family History  Family history unknown: Yes   Social History   Tobacco Use  . Smoking status: Former Smoker    Packs/day: 1.00    Types: Cigarettes    Quit date: 04/17/2017    Years since quitting: 2.7  . Smokeless tobacco: Never Used   Substance Use Topics  . Alcohol use: No   Allergies  Allergen Reactions  . Bee Venom Anaphylaxis   Prior to Admission medications   Medication Sig Start Date End Date Taking? Authorizing Provider  ENTRESTO 24-26 MG Take 1 tablet by mouth 2 (two) times daily. 12/04/19  Yes [provider]  furosemide (LASIX) 40 MG tablet Take 1 tablet (40 mg total) by mouth 2 (two) times daily. 08/17/19 08/16/20 Yes Pria Klosinski, Otila Kluver A, FNP  insulin aspart protamine - aspart (NOVOLOG MIX 70/30 FLEXPEN) (70-30) 100 UNIT/ML FlexPen Inject 0.1 mLs (10 Units total) into the skin 2 (two) times daily. Patient taking differently: Inject 15 Units into the skin 2 (two) times daily.  01/15/19  Yes Dustin Flock, MD  isosorbide mononitrate (IMDUR) 30 MG 24 hr tablet Take 1 tablet (30 mg total) by mouth daily. 01/08/20  Yes Darylene Price A, FNP  metoprolol succinate (TOPROL-XL) 25 MG 24 hr tablet Take 0.5 tablets (12.5 mg total) by mouth daily. Patient taking differently: Take 25 mg by mouth daily.  07/12/19  Yes Epifanio Lesches, MD  oxyCODONE-acetaminophen (PERCOCET) 5-325 MG tablet Take 1-2 tablets by mouth every 8 (eight) hours as needed for severe pain. 01/05/20  Yes Samara Deist, DPM  potassium chloride SA (K-DUR) 20 MEQ tablet Take 20 mEq by mouth daily.   Yes [provider]    Review of Systems  Constitutional: Negative for appetite change and fatigue.  HENT: Negative for congestion, postnasal drip and sore throat.   Eyes: Negative.  Respiratory: Negative for cough and shortness of breath.   Cardiovascular: Negative for chest pain, palpitations and leg swelling.  Gastrointestinal: Negative for abdominal distention and abdominal pain.  Endocrine: Negative.   Genitourinary: Negative.   Musculoskeletal: Negative for back pain and neck pain.  Skin: Negative.   Allergic/Immunologic: Negative.   Neurological: Negative for dizziness and light-headedness.  Hematological: Negative for  adenopathy. Does not bruise/bleed easily.  Psychiatric/Behavioral: Negative for dysphoric mood and sleep disturbance. The patient is not nervous/anxious.    Vitals:   01/15/20 1130  BP: 115/73  Pulse: 75  Resp: 18  SpO2: 100%  Weight: 264 lb 9.6 oz (120 kg)  Height: 6\' 5"  (1.956 m)   Wt Readings from Last 3 Encounters:  01/15/20 264 lb 9.6 oz (120 kg)  01/05/20 257 lb (116.6 kg)  11/13/19 278 lb 12.8 oz (126.5 kg)   Lab Results  Component Value Date   CREATININE 1.00 01/05/2020   CREATININE 1.04 11/13/2019   CREATININE 1.05 10/16/2019    Physical Exam Vitals and nursing note reviewed.  Constitutional:      Appearance: Normal appearance.  HENT:     Head: Normocephalic and atraumatic.  Cardiovascular:     Rate and Rhythm: Normal rate and regular rhythm.  Pulmonary:     Effort: Pulmonary effort is normal.     Breath sounds: No wheezing, rhonchi or rales.  Abdominal:     General: There is no distension.     Palpations: Abdomen is soft.     Tenderness: There is no abdominal tenderness.  Musculoskeletal:        General: No tenderness.     Cervical back: Normal range of motion and neck supple.     Right lower leg: No edema.     Left lower leg: Edema (trace pitting) present.  Skin:    General: Skin is warm and dry.  Neurological:     General: No focal deficit present.     Mental Status: He is alert and oriented to person, place, and time.  Psychiatric:        Mood and Affect: Mood normal.        Behavior: Behavior normal.        Thought Content: Thought content normal.     Assessment & Plan:  1: Chronic heart failure with reduced ejection fraction- - NYHA class I - euvolemic today - weighing daily; reminded to call for an overnight weight gain of >2 pounds or a weekly weight gain of >5 pounds - weight down 13 pounds from last visit here 2 months ago - not adding salt and is trying to limit his sodium intake - not walking much right now as he's out of work due  to recent surgery - saw cardiology Clayborn Bigness) 07/06/2019 - participating in paramedicine program  - metoprolol increased to 25mg  daily at last visit; discussed titrating up at next visit - doubtful BP could tolerate titration of entresto - BNP 07/10/2019 was 968.0 - does not take the flu vaccine; good handwashing encouraged  2: HTN- - BP looks good today - saw PCP [name]) 11/30/19 - BMP 01/05/20 reviewed and showed sodium 135, potassium 4.3, creatinine 1.00  3: DM- - A1c 05/28/2019 was 5.6% - nonfasting glucose today in clinic was 234 and he said that he just ate a popsicle - had left great toe amputated ~ 2 weeks ago and is currently wearing a boot  Patient did not bring his medications nor a list. Each medication was verbally reviewed  with the patient and he was encouraged to bring the bottles to every visit to confirm accuracy of list.   Return in 1 month or sooner for any questions/problems before then.

## 2020-01-15 ENCOUNTER — Other Ambulatory Visit: Payer: Self-pay

## 2020-01-15 ENCOUNTER — Ambulatory Visit: Payer: BC Managed Care – PPO | Attending: Family | Admitting: Family

## 2020-01-15 ENCOUNTER — Encounter: Payer: Self-pay | Admitting: Family

## 2020-01-15 VITALS — BP 115/73 | HR 75 | Resp 18 | Ht 77.0 in | Wt 264.6 lb

## 2020-01-15 DIAGNOSIS — I428 Other cardiomyopathies: Secondary | ICD-10-CM | POA: Diagnosis not present

## 2020-01-15 DIAGNOSIS — I10 Essential (primary) hypertension: Secondary | ICD-10-CM

## 2020-01-15 DIAGNOSIS — E119 Type 2 diabetes mellitus without complications: Secondary | ICD-10-CM | POA: Diagnosis not present

## 2020-01-15 DIAGNOSIS — Z794 Long term (current) use of insulin: Secondary | ICD-10-CM | POA: Diagnosis not present

## 2020-01-15 DIAGNOSIS — I110 Hypertensive heart disease with heart failure: Secondary | ICD-10-CM | POA: Insufficient documentation

## 2020-01-15 DIAGNOSIS — Z79899 Other long term (current) drug therapy: Secondary | ICD-10-CM | POA: Insufficient documentation

## 2020-01-15 DIAGNOSIS — I5022 Chronic systolic (congestive) heart failure: Secondary | ICD-10-CM

## 2020-01-15 DIAGNOSIS — Z7901 Long term (current) use of anticoagulants: Secondary | ICD-10-CM | POA: Diagnosis not present

## 2020-01-15 DIAGNOSIS — Z87891 Personal history of nicotine dependence: Secondary | ICD-10-CM | POA: Insufficient documentation

## 2020-01-15 DIAGNOSIS — E1159 Type 2 diabetes mellitus with other circulatory complications: Secondary | ICD-10-CM

## 2020-01-15 LAB — GLUCOSE, CAPILLARY: Glucose-Capillary: 234 mg/dL — ABNORMAL HIGH (ref 70–99)

## 2020-01-15 NOTE — Patient Instructions (Signed)
Continue weighing daily and call for an overnight weight gain of > 2 pounds or a weekly weight gain of >5 pounds. 

## 2020-02-10 NOTE — Progress Notes (Signed)
Patient ID: Gary Bentley, male    DOB: 1971/08/01, 49 y.o.   MRN: [id number]  HPI  Gary Bentley is a 49 y/o male with a history of DM, HTN, previous tobacco use and chronic heart failure.   Echo report from 01/10/2019 reviewed and showed an EF of 15-20% along with trivial AR and mild pulmonic stenosis.   Catheterization done 01/13/2019 showed: Nonischemic cardiomyopathy, no significant CAD Mildly elevated wedge pressure,  EF severely depressed, global Hypokinesis, EF 20%  Has not been admitted or been in the ED in the last 6 months.   He presents today for a follow-up visit with a chief complaint of difficulty sleeping. He describes this as intermittent in nature depending on his anxiety. He has minimal swelling of the left lower leg along with this. He denies any dizziness, abdominal distention, palpitations, chest pain, shortness of breath, cough, fatigue or weight gain.   Has tolerated the increase in metoprolol without known side effects.   Past Medical History:  Diagnosis Date  . CHF (congestive heart failure) ([place])   . Diabetes mellitus without complication ([place])   . Hypertension    Past Surgical History:  Procedure Laterality Date  . AMPUTATION TOE Left 01/05/2020   Procedure: AMPUTATION TOE MPJ LEFT;  Surgeon: Samara Deist, DPM;  Location: [facility];  Service: Podiatry;  Laterality: Left;  . FOOT SURGERY    . RIGHT/LEFT HEART CATH AND CORONARY ANGIOGRAPHY N/A 01/13/2019   Procedure: RIGHT/LEFT HEART CATH AND CORONARY ANGIOGRAPHY;  Surgeon: Minna Merritts, MD;  Location: [facility];  Service: Cardiovascular;  Laterality: N/A;   Family History  Family history unknown: Yes   Social History   Tobacco Use  . Smoking status: Former Smoker    Packs/day: 1.00    Types: Cigarettes    Quit date: 04/17/2017    Years since quitting: 2.8  . Smokeless tobacco: Never Used  Substance Use Topics  . Alcohol use: No   Allergies  Allergen Reactions  . Bee Venom  Anaphylaxis   Prior to Admission medications   Medication Sig Start Date End Date Taking? Authorizing Provider  ENTRESTO 24-26 MG Take 1 tablet by mouth 2 (two) times daily. 12/04/19  Yes [provider]  furosemide (LASIX) 40 MG tablet Take 1 tablet (40 mg total) by mouth 2 (two) times daily. 08/17/19 08/16/20 Yes Arihanna Estabrook, Otila Kluver A, FNP  insulin aspart protamine - aspart (NOVOLOG MIX 70/30 FLEXPEN) (70-30) 100 UNIT/ML FlexPen Inject 0.1 mLs (10 Units total) into the skin 2 (two) times daily. Patient taking differently: Inject 15 Units into the skin 2 (two) times daily.  01/15/19  Yes Dustin Flock, MD  isosorbide mononitrate (IMDUR) 30 MG 24 hr tablet Take 1 tablet (30 mg total) by mouth daily. 01/08/20  Yes Darylene Price A, FNP  metoprolol succinate (TOPROL-XL) 25 MG 24 hr tablet Take 25 mg by mouth daily.   Yes [provider]  oxyCODONE-acetaminophen (PERCOCET) 5-325 MG tablet Take 1-2 tablets by mouth every 8 (eight) hours as needed for severe pain. 01/05/20  Yes Samara Deist, DPM  potassium chloride SA (K-DUR) 20 MEQ tablet Take 20 mEq by mouth daily.   Yes [provider]     Review of Systems  Constitutional: Negative for appetite change and fatigue.  HENT: Negative for congestion, postnasal drip and sore throat.   Eyes: Negative.   Respiratory: Negative for cough and shortness of breath.   Cardiovascular: Positive for leg swelling (left lower leg). Negative for chest  pain and palpitations.  Gastrointestinal: Negative for abdominal distention and abdominal pain.  Endocrine: Negative.   Genitourinary: Negative.   Musculoskeletal: Negative for back pain and neck pain.  Skin: Negative.   Allergic/Immunologic: Negative.   Neurological: Negative for dizziness and light-headedness.  Hematological: Negative for adenopathy. Does not bruise/bleed easily.  Psychiatric/Behavioral: Positive for sleep disturbance (sleeping on 2 pillows). Negative for dysphoric mood. The  patient is nervous/anxious.    Vitals:   02/12/20 1047  BP: 108/69  Pulse: 93  Resp: 20  SpO2: 100%  Weight: 265 lb 6.4 oz (120.4 kg)  Height: 6\' 5"  (1.956 m)   Wt Readings from Last 3 Encounters:  02/12/20 265 lb 6.4 oz (120.4 kg)  01/15/20 264 lb 9.6 oz (120 kg)  01/05/20 257 lb (116.6 kg)   Lab Results  Component Value Date   CREATININE 1.00 01/05/2020   CREATININE 1.04 11/13/2019   CREATININE 1.05 10/16/2019     Physical Exam Vitals and nursing note reviewed.  Constitutional:      Appearance: Normal appearance.  HENT:     Head: Normocephalic and atraumatic.  Cardiovascular:     Rate and Rhythm: Normal rate and regular rhythm.  Pulmonary:     Effort: Pulmonary effort is normal.     Breath sounds: No wheezing, rhonchi or rales.  Abdominal:     General: There is no distension.     Palpations: Abdomen is soft.     Tenderness: There is no abdominal tenderness.  Musculoskeletal:        General: No tenderness.     Cervical back: Normal range of motion and neck supple.     Right lower leg: No edema.     Left lower leg: Edema (trace pitting) present.  Skin:    General: Skin is warm and dry.  Neurological:     General: No focal deficit present.     Mental Status: He is alert and oriented to person, place, and time.  Psychiatric:        Mood and Affect: Mood normal.        Behavior: Behavior normal.        Thought Content: Thought content normal.     Assessment & Plan:  1: Chronic heart failure with reduced ejection fraction- - NYHA class I - euvolemic today - weighing daily; reminded to call for an overnight weight gain of >2 pounds or a weekly weight gain of >5 pounds - weight up 1 pound from last visit here 1 month ago - not adding salt and is trying to limit his sodium intake - back at work and still has boot on his left foot; continues to wrap his foot but thinks he's wrapping it too tightly - saw cardiology Gary Bentley) 07/06/2019 - participating in  paramedicine program  - metoprolol increased to 25mg  daily at last visit; consider titrating it at his next visit - doubtful BP could tolerate titration of entresto - BNP 07/10/2019 was 968.0 - does not take the flu vaccine; good handwashing encouraged  2: HTN- - BP on the low side - saw PCP [name]) 11/30/19 - BMP 01/05/20 reviewed and showed sodium 135, potassium 4.3, creatinine 1.00  3: DM- - A1c 05/28/2019 was 5.6% - fasting glucose today in clinic was 72; graham crackers/ juice given to patient - had left great toe amputated ~ 3 months ago and is currently wearing a boot   Patient did not bring his medications nor a list. Each medication was verbally reviewed with the  patient and he was encouraged to bring the bottles to every visit to confirm accuracy of list.   Return in 2 months or sooner for any questions/problems before then.

## 2020-02-12 ENCOUNTER — Encounter: Payer: Self-pay | Admitting: Family

## 2020-02-12 ENCOUNTER — Other Ambulatory Visit: Payer: Self-pay

## 2020-02-12 ENCOUNTER — Ambulatory Visit: Payer: BC Managed Care – PPO | Attending: Family | Admitting: Family

## 2020-02-12 VITALS — BP 108/69 | HR 93 | Resp 20 | Ht 77.0 in | Wt 265.4 lb

## 2020-02-12 DIAGNOSIS — G479 Sleep disorder, unspecified: Secondary | ICD-10-CM | POA: Insufficient documentation

## 2020-02-12 DIAGNOSIS — I5022 Chronic systolic (congestive) heart failure: Secondary | ICD-10-CM

## 2020-02-12 DIAGNOSIS — E1159 Type 2 diabetes mellitus with other circulatory complications: Secondary | ICD-10-CM

## 2020-02-12 DIAGNOSIS — Z87891 Personal history of nicotine dependence: Secondary | ICD-10-CM | POA: Diagnosis not present

## 2020-02-12 DIAGNOSIS — Z794 Long term (current) use of insulin: Secondary | ICD-10-CM | POA: Diagnosis not present

## 2020-02-12 DIAGNOSIS — I428 Other cardiomyopathies: Secondary | ICD-10-CM | POA: Insufficient documentation

## 2020-02-12 DIAGNOSIS — Z79899 Other long term (current) drug therapy: Secondary | ICD-10-CM | POA: Diagnosis not present

## 2020-02-12 DIAGNOSIS — E119 Type 2 diabetes mellitus without complications: Secondary | ICD-10-CM | POA: Diagnosis not present

## 2020-02-12 DIAGNOSIS — I10 Essential (primary) hypertension: Secondary | ICD-10-CM

## 2020-02-12 DIAGNOSIS — Z7901 Long term (current) use of anticoagulants: Secondary | ICD-10-CM | POA: Diagnosis not present

## 2020-02-12 DIAGNOSIS — M7989 Other specified soft tissue disorders: Secondary | ICD-10-CM | POA: Insufficient documentation

## 2020-02-12 DIAGNOSIS — I110 Hypertensive heart disease with heart failure: Secondary | ICD-10-CM | POA: Diagnosis not present

## 2020-02-12 LAB — GLUCOSE, CAPILLARY: Glucose-Capillary: 72 mg/dL (ref 70–99)

## 2020-02-12 NOTE — Patient Instructions (Signed)
Continue weighing daily and call for an overnight weight gain of > 2 pounds or a weekly weight gain of >5 pounds. 

## 2020-02-18 ENCOUNTER — Ambulatory Visit: Payer: BC Managed Care – PPO | Attending: Internal Medicine

## 2020-02-18 DIAGNOSIS — Z23 Encounter for immunization: Secondary | ICD-10-CM

## 2020-02-18 NOTE — Progress Notes (Signed)
   Covid-19 Vaccination Clinic  Name:  Gary Bentley    MRN: [id number] DOB: 04/29/1971  02/18/2020  [name] was observed post Covid-19 immunization for 15 minutes without incident. He was provided with Vaccine Information Sheet and instruction to access the V-Safe system.   [name] was instructed to call 911 with any severe reactions post vaccine: Marland Kitchen Difficulty breathing  . Swelling of face and throat  . A fast heartbeat  . A bad rash all over body  . Dizziness and weakness   Immunizations Administered    Name Date Dose VIS Date Route   Pfizer COVID-19 Vaccine 02/18/2020 11:46 AM 0.3 mL 11/03/2019 Intramuscular   Manufacturer: [place]   Lot: [id number]   [place]: [id number]

## 2020-02-21 ENCOUNTER — Telehealth (HOSPITAL_COMMUNITY): Payer: Self-pay

## 2020-02-21 NOTE — Telephone Encounter (Signed)
Gary Bentley states he is doing good.  He is back at work since his surgery last month.  He states has all his medications and he is aware of how to take.  He denies any problems today.  He denies swelling.  He states he has everything he needs.  Aware of up coming appts.  Will continue to visit for heart failure, medication management and reminder of appts.   [place] [phone]

## 2020-03-12 ENCOUNTER — Telehealth (HOSPITAL_COMMUNITY): Payer: Self-pay

## 2020-03-12 NOTE — Telephone Encounter (Signed)
[place] contacted me to see if I could contact Otila Kluver with HF clinic to see if she can clear him for surgery with Urologist Florian Buff in [place], [place].  Sent message to [place].   [place] [phone]

## 2020-03-13 ENCOUNTER — Ambulatory Visit: Payer: BC Managed Care – PPO | Attending: Internal Medicine

## 2020-03-13 DIAGNOSIS — Z23 Encounter for immunization: Secondary | ICD-10-CM

## 2020-03-13 NOTE — Progress Notes (Signed)
   Covid-19 Vaccination Clinic  Name:  Gary Bentley    MRN: [id number] DOB: 1971-10-28  03/13/2020  Gary Bentley was observed post Covid-19 immunization for 15 minutes without incident. He was provided with Vaccine Information Sheet and instruction to access the V-Safe system.   Gary Bentley was instructed to call 911 with any severe reactions post vaccine: Marland Kitchen Difficulty breathing  . Swelling of face and throat  . A fast heartbeat  . A bad rash all over body  . Dizziness and weakness   Immunizations Administered    Name Date Dose VIS Date Route   Pfizer COVID-19 Vaccine 03/13/2020  9:01 AM 0.3 mL 01/17/2019 Intramuscular   Manufacturer: [employer]   Lot: [id number]   [place]: [id number]

## 2020-03-17 ENCOUNTER — Emergency Department: Payer: BC Managed Care – PPO

## 2020-03-17 ENCOUNTER — Other Ambulatory Visit: Payer: Self-pay

## 2020-03-17 ENCOUNTER — Emergency Department
Admission: EM | Admit: 2020-03-17 | Discharge: 2020-03-17 | Disposition: A | Discharge status: Home / Self Care | Payer: BC Managed Care – PPO | Attending: Emergency Medicine | Admitting: Emergency Medicine

## 2020-03-17 DIAGNOSIS — Y9389 Activity, other specified: Secondary | ICD-10-CM | POA: Insufficient documentation

## 2020-03-17 DIAGNOSIS — Y999 Unspecified external cause status: Secondary | ICD-10-CM | POA: Insufficient documentation

## 2020-03-17 DIAGNOSIS — Z87891 Personal history of nicotine dependence: Secondary | ICD-10-CM | POA: Diagnosis not present

## 2020-03-17 DIAGNOSIS — S8002XA Contusion of left knee, initial encounter: Secondary | ICD-10-CM | POA: Diagnosis not present

## 2020-03-17 DIAGNOSIS — I110 Hypertensive heart disease with heart failure: Secondary | ICD-10-CM | POA: Insufficient documentation

## 2020-03-17 DIAGNOSIS — S61012A Laceration without foreign body of left thumb without damage to nail, initial encounter: Secondary | ICD-10-CM | POA: Insufficient documentation

## 2020-03-17 DIAGNOSIS — Z794 Long term (current) use of insulin: Secondary | ICD-10-CM | POA: Insufficient documentation

## 2020-03-17 DIAGNOSIS — I5021 Acute systolic (congestive) heart failure: Secondary | ICD-10-CM | POA: Diagnosis not present

## 2020-03-17 DIAGNOSIS — Z79899 Other long term (current) drug therapy: Secondary | ICD-10-CM | POA: Diagnosis not present

## 2020-03-17 DIAGNOSIS — Y92810 Car as the place of occurrence of the external cause: Secondary | ICD-10-CM | POA: Diagnosis not present

## 2020-03-17 DIAGNOSIS — Z89422 Acquired absence of other left toe(s): Secondary | ICD-10-CM | POA: Insufficient documentation

## 2020-03-17 DIAGNOSIS — E119 Type 2 diabetes mellitus without complications: Secondary | ICD-10-CM | POA: Insufficient documentation

## 2020-03-17 DIAGNOSIS — S52101A Unspecified fracture of upper end of right radius, initial encounter for closed fracture: Secondary | ICD-10-CM | POA: Insufficient documentation

## 2020-03-17 DIAGNOSIS — S59901A Unspecified injury of right elbow, initial encounter: Secondary | ICD-10-CM | POA: Diagnosis present

## 2020-03-17 DIAGNOSIS — S60222A Contusion of left hand, initial encounter: Secondary | ICD-10-CM

## 2020-03-17 DIAGNOSIS — T07XXXA Unspecified multiple injuries, initial encounter: Secondary | ICD-10-CM

## 2020-03-17 MED ORDER — HYDROCODONE-ACETAMINOPHEN 5-325 MG PO TABS: 2.0000 | ORAL_TABLET | Freq: Four times a day (QID) | ORAL | 0 refills | Status: DC | PRN

## 2020-03-17 MED ORDER — ACETAMINOPHEN 325 MG PO TABS
650.0000 mg | ORAL_TABLET | Freq: Once | ORAL | Status: AC
  Administered 2020-03-17: 650 mg via ORAL
  Filled 2020-03-17: qty 2

## 2020-03-17 MED ORDER — OXYCODONE-ACETAMINOPHEN 5-325 MG PO TABS
1.0000 | ORAL_TABLET | Freq: Once | ORAL | Status: AC
  Administered 2020-03-17: 1 via ORAL
  Filled 2020-03-17: qty 1

## 2020-03-17 MED ORDER — AMOXICILLIN-POT CLAVULANATE 875-125 MG PO TABS
1.0000 | ORAL_TABLET | Freq: Once | ORAL | Status: AC
  Administered 2020-03-17: 1 via ORAL
  Filled 2020-03-17: qty 1

## 2020-03-17 MED ORDER — AMOXICILLIN-POT CLAVULANATE 875-125 MG PO TABS: 1.0000 | ORAL_TABLET | Freq: Two times a day (BID) | ORAL | 0 refills | Status: AC
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
## 2020-03-17 NOTE — ED Notes (Signed)
Pt visibly upset due to pending d/c, states we are rushing him to leave. Offered to push pt in a wheelchair to make him more comfortable, pt declined.

## 2020-03-17 NOTE — ED Notes (Signed)
Report received from [place], Therapist, sports. Pt is currently sleeping at this time.

## 2020-03-17 NOTE — Discharge Instructions (Addendum)
As we discussed, you have a fracture just below the elbow in the upper part of the bone called the radius.  For your comfort and protection of the bone, we placed in a splint.  Please call the orthopedic clinic on Monday and explained that you were seen in the emergency department over the weekend and diagnosed with a proximal radial fracture and that you were placed in a splint.  They will schedule you for a follow-up appointment.  Use over-the-counter ibuprofen and/or Tylenol as needed. Take Norco as prescribed for severe pain. Do not drink alcohol, drive or participate in any other potentially dangerous activities while taking this medication as it may make you sleepy. Do not take this medication with any other sedating medications, either prescription or over-the-counter. If you were prescribed Percocet or Vicodin, do not take these with acetaminophen (Tylenol) as it is already contained within these medications.   This medication is an opiate (or narcotic) pain medication and can be habit forming.  Use it as little as possible to achieve adequate pain control.  Do not use or use it with extreme caution if you have a history of opiate abuse or dependence.  If you are on a pain contract with your primary care doctor or a pain specialist, be sure to let them know you were prescribed this medication today from the [facility] Emergency Department.  This medication is intended for your use only - do not give any to anyone else and keep it in a secure place where nobody else, especially children, have access to it.  It will also cause or worsen constipation, so you may want to consider taking an over-the-counter stool softener while you are taking this medication.  Keep your wounds clean and dry and use the topical antibiotic ointment such as bacitracin.  Complete the full course of prescribed antibiotics.      Return to the emergency department if you develop new or worsening symptoms that concern you.
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
## 2020-03-19 ENCOUNTER — Other Ambulatory Visit (HOSPITAL_COMMUNITY): Payer: Self-pay
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
## 2020-03-20 ENCOUNTER — Emergency Department: Payer: BC Managed Care – PPO

## 2020-03-20 ENCOUNTER — Other Ambulatory Visit: Payer: Self-pay

## 2020-03-20 ENCOUNTER — Emergency Department
Admission: EM | Admit: 2020-03-20 | Discharge: 2020-03-20 | Disposition: A | Discharge status: Home / Self Care | Payer: BC Managed Care – PPO | Attending: Emergency Medicine | Admitting: Emergency Medicine

## 2020-03-20 DIAGNOSIS — M25562 Pain in left knee: Secondary | ICD-10-CM | POA: Diagnosis present

## 2020-03-20 DIAGNOSIS — I110 Hypertensive heart disease with heart failure: Secondary | ICD-10-CM | POA: Diagnosis not present

## 2020-03-20 DIAGNOSIS — M25462 Effusion, left knee: Secondary | ICD-10-CM

## 2020-03-20 DIAGNOSIS — I5023 Acute on chronic systolic (congestive) heart failure: Secondary | ICD-10-CM | POA: Insufficient documentation

## 2020-03-20 DIAGNOSIS — Z794 Long term (current) use of insulin: Secondary | ICD-10-CM | POA: Insufficient documentation

## 2020-03-20 DIAGNOSIS — R2242 Localized swelling, mass and lump, left lower limb: Secondary | ICD-10-CM | POA: Diagnosis not present

## 2020-03-20 DIAGNOSIS — E1165 Type 2 diabetes mellitus with hyperglycemia: Secondary | ICD-10-CM | POA: Insufficient documentation

## 2020-03-20 DIAGNOSIS — Z87891 Personal history of nicotine dependence: Secondary | ICD-10-CM | POA: Diagnosis not present

## 2020-03-20 DIAGNOSIS — Z79899 Other long term (current) drug therapy: Secondary | ICD-10-CM | POA: Diagnosis not present

## 2020-03-20 LAB — CBC
HCT: 34.8 % — ABNORMAL LOW (ref 39.0–52.0)
Hemoglobin: 11.6 g/dL — ABNORMAL LOW (ref 13.0–17.0)
MCH: 33.4 pg (ref 26.0–34.0)
MCHC: 33.3 g/dL (ref 30.0–36.0)
MCV: 100.3 fL — ABNORMAL HIGH (ref 80.0–100.0)
Platelets: 296 10*3/uL (ref 150–400)
RBC: 3.47 MIL/uL — ABNORMAL LOW (ref 4.22–5.81)
RDW: 11.7 % (ref 11.5–15.5)
WBC: 7.6 10*3/uL (ref 4.0–10.5)
nRBC: 0 % (ref 0.0–0.2)

## 2020-03-20 LAB — BASIC METABOLIC PANEL
Anion gap: 8 (ref 5–15)
BUN: 19 mg/dL (ref 6–20)
CO2: 23 mmol/L (ref 22–32)
Calcium: 8.5 mg/dL — ABNORMAL LOW (ref 8.9–10.3)
Chloride: 103 mmol/L (ref 98–111)
Creatinine, Ser: 1.11 mg/dL (ref 0.61–1.24)
GFR calc Af Amer: >60 mL/min (ref >60)
GFR calc non Af Amer: >60 mL/min (ref >60)
Glucose, Bld: 383 mg/dL — ABNORMAL HIGH (ref 70–99)
Potassium: 4.1 mmol/L (ref 3.5–5.1)
Sodium: 134 mmol/L — ABNORMAL LOW (ref 135–145)

## 2020-03-20 LAB — GLUCOSE, CAPILLARY
Glucose-Capillary: 359 mg/dL — ABNORMAL HIGH (ref 70–99)
Glucose-Capillary: 255 mg/dL — ABNORMAL HIGH (ref 70–99)

## 2020-03-20 MED ORDER — INSULIN ASPART 100 UNIT/ML ~~LOC~~ SOLN
10.0000 [IU] | Freq: Once | SUBCUTANEOUS | Status: AC
  Administered 2020-03-20: 10 [IU] via SUBCUTANEOUS
  Filled 2020-03-20: qty 1

## 2020-03-20 MED ORDER — HYDROCODONE-ACETAMINOPHEN 5-325 MG PO TABS: 1.0000 | ORAL_TABLET | ORAL | 0 refills | Status: DC | PRN

## 2020-03-20 MED ORDER — HYDROMORPHONE HCL 1 MG/ML IJ SOLN
1.0000 mg | Freq: Once | INTRAMUSCULAR | Status: AC
  Administered 2020-03-20: 16:00:00 1 mg via INTRAMUSCULAR
  Filled 2020-03-20: qty 1

## 2020-03-20 NOTE — ED Triage Notes (Signed)
Pt comes via pOV from home with c/o hyperglycemia. Pt also c/o left knee and foot pain. Pt states swelling to leg and foot.  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 Pt denies any drainage.  Pt states hx of CHF.
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
## 2020-03-20 NOTE — ED Notes (Signed)
Pt splint was reapplied  And dressing replaced with clean dressing and ace wraps. Sling reapplied appropriately.  Two small abrasions noted to pt right elbow. Abrasions cleaned with NS, petroleum and gauze applied to area. Pt and spouse instructed on care and reapplication of splint.

## 2020-03-20 NOTE — ED Notes (Signed)
Pt stated "I feel like my sugar is high." Pt did not check glucose at home. CBG here is 359, Sam, RN made aware.

## 2020-04-01 ENCOUNTER — Telehealth (HOSPITAL_COMMUNITY): Payer: Self-pay

## 2020-04-01 ENCOUNTER — Other Ambulatory Visit: Payer: Self-pay | Admitting: Family

## 2020-04-01 NOTE — Telephone Encounter (Signed)
Zinedine contacted me to see if I could call in his refills.  [facility] they are refilling Lasix, isosorbide, metoprolol and potassium.  Entresto needs refills, contacted Otila Kluver at [facility] clinic to send refill.  They should be ready this evening.  Advised Jagdish his meds were called in.   [place] [phone]

## 2020-04-09 ENCOUNTER — Ambulatory Visit: Payer: BC Managed Care – PPO | Attending: Family | Admitting: Family

## 2020-04-09 ENCOUNTER — Encounter: Payer: Self-pay | Admitting: Family

## 2020-04-09 ENCOUNTER — Other Ambulatory Visit: Payer: Self-pay

## 2020-04-09 VITALS — BP 146/85 | HR 69 | Resp 20 | Ht 77.5 in | Wt 270.0 lb

## 2020-04-09 DIAGNOSIS — E119 Type 2 diabetes mellitus without complications: Secondary | ICD-10-CM | POA: Insufficient documentation

## 2020-04-09 DIAGNOSIS — Z794 Long term (current) use of insulin: Secondary | ICD-10-CM | POA: Insufficient documentation

## 2020-04-09 DIAGNOSIS — R0602 Shortness of breath: Secondary | ICD-10-CM | POA: Diagnosis present

## 2020-04-09 DIAGNOSIS — I428 Other cardiomyopathies: Secondary | ICD-10-CM | POA: Insufficient documentation

## 2020-04-09 DIAGNOSIS — E1159 Type 2 diabetes mellitus with other circulatory complications: Secondary | ICD-10-CM

## 2020-04-09 DIAGNOSIS — M25562 Pain in left knee: Secondary | ICD-10-CM | POA: Diagnosis not present

## 2020-04-09 DIAGNOSIS — I5022 Chronic systolic (congestive) heart failure: Secondary | ICD-10-CM

## 2020-04-09 DIAGNOSIS — Y929 Unspecified place or not applicable: Secondary | ICD-10-CM | POA: Insufficient documentation

## 2020-04-09 DIAGNOSIS — M79601 Pain in right arm: Secondary | ICD-10-CM | POA: Insufficient documentation

## 2020-04-09 DIAGNOSIS — I110 Hypertensive heart disease with heart failure: Secondary | ICD-10-CM | POA: Insufficient documentation

## 2020-04-09 DIAGNOSIS — Z7901 Long term (current) use of anticoagulants: Secondary | ICD-10-CM | POA: Diagnosis not present

## 2020-04-09 DIAGNOSIS — Y939 Activity, unspecified: Secondary | ICD-10-CM | POA: Diagnosis not present

## 2020-04-09 DIAGNOSIS — S42401A Unspecified fracture of lower end of right humerus, initial encounter for closed fracture: Secondary | ICD-10-CM | POA: Diagnosis not present

## 2020-04-09 DIAGNOSIS — Y999 Unspecified external cause status: Secondary | ICD-10-CM | POA: Diagnosis not present

## 2020-04-09 DIAGNOSIS — I10 Essential (primary) hypertension: Secondary | ICD-10-CM

## 2020-04-09 DIAGNOSIS — Z79899 Other long term (current) drug therapy: Secondary | ICD-10-CM | POA: Diagnosis not present

## 2020-04-09 DIAGNOSIS — Z87891 Personal history of nicotine dependence: Secondary | ICD-10-CM | POA: Diagnosis not present

## 2020-04-09 LAB — GLUCOSE, CAPILLARY: Glucose-Capillary: 280 mg/dL — ABNORMAL HIGH (ref 70–99)
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
## 2020-04-09 NOTE — Patient Instructions (Signed)
Continue weighing daily and call for an overnight weight gain of > 2 pounds or a weekly weight gain of >5 pounds. 

## 2020-04-10 ENCOUNTER — Ambulatory Visit: Payer: BC Managed Care – PPO | Admitting: Family

## 2020-05-20 ENCOUNTER — Telehealth (HOSPITAL_COMMUNITY): Payer: Self-pay
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
## 2020-06-03 ENCOUNTER — Telehealth (HOSPITAL_COMMUNITY): Payer: Self-pay

## 2020-06-03 ENCOUNTER — Other Ambulatory Visit: Payer: Self-pay | Admitting: Family

## 2020-06-03 MED ORDER — FUROSEMIDE 40 MG PO TABS: 40.0000 mg | ORAL_TABLET | Freq: Two times a day (BID) | ORAL | 5 refills | Status: DC

## 2020-06-03 NOTE — Telephone Encounter (Signed)
Gary Bentley contacted me today wanting me to call in his refills for him.  He states doing well.  Contacted [facility] and they advised they could get all ready but the furosemide because needs refills.  Contacted Tina with HF clinic for reill.  Advised him should be ready later today.   [place] [phone]

## 2020-07-05 DIAGNOSIS — T83410D Breakdown (mechanical) of penile (implanted) prosthesis, subsequent encounter: Secondary | ICD-10-CM | POA: Insufficient documentation

## 2020-07-17 ENCOUNTER — Telehealth (HOSPITAL_COMMUNITY): Payer: Self-pay

## 2020-07-17 ENCOUNTER — Other Ambulatory Visit: Payer: Self-pay | Admitting: Family

## 2020-07-17 MED ORDER — POTASSIUM CHLORIDE CRYS ER 20 MEQ PO TBCR: 20.0000 meq | EXTENDED_RELEASE_TABLET | Freq: Every day | ORAL | 3 refills | Status: DC

## 2020-07-23 NOTE — Telephone Encounter (Signed)
Today had spoke to Gary Bentley on the phone.  He works everyday and has been doing good.  He has his surgery scheduled.  He needs refills called in to his pharmacy.  He will be out today.  Contacted HF clinic to send new refill for potassium and contacted Cardiology at [facility] for refill on Metoprolol.  He states has been taking his medications and you can tell because I call in his refills about same time every month.  He denies any chest pain, shortness of breath, headaches or dizziness.  He denies any more swelling than his normal.  He is aware of up coming appts.  He knows how to take his medications.  He tries to watch his diet.  He tries to watch fluid intake.  Will continue to visit for heart failure and medication compliance.   [place] [phone]

## 2020-08-16 ENCOUNTER — Ambulatory Visit: Payer: BC Managed Care – PPO | Admitting: Family

## 2020-08-21 ENCOUNTER — Telehealth (HOSPITAL_COMMUNITY): Payer: Self-pay

## 2020-08-22 NOTE — Telephone Encounter (Signed)
Today Gary Bentley contacted me advised that his surgery was cancelled and he wanted to reschedule his HF appt for earlier date.  He states feeling fine.  He states weight is same, he has no swelling, chest pain, headaches or dizziness.  He states has all his medications.   Contacted HF clinic and they changed his appt.  He states will make it.  Will continue to visit for Heart Failure and medication compliance.    [place] [phone]

## 2020-08-27 DIAGNOSIS — L97512 Non-pressure chronic ulcer of other part of right foot with fat layer exposed: Secondary | ICD-10-CM | POA: Diagnosis not present

## 2020-08-27 DIAGNOSIS — E11621 Type 2 diabetes mellitus with foot ulcer: Secondary | ICD-10-CM | POA: Diagnosis not present

## 2020-08-27 DIAGNOSIS — E1142 Type 2 diabetes mellitus with diabetic polyneuropathy: Secondary | ICD-10-CM | POA: Diagnosis not present

## 2020-08-28 ENCOUNTER — Other Ambulatory Visit: Payer: Self-pay

## 2020-08-28 ENCOUNTER — Ambulatory Visit: Payer: BC Managed Care – PPO | Admitting: Family

## 2020-08-28 ENCOUNTER — Encounter: Payer: Self-pay | Admitting: Family

## 2020-08-28 ENCOUNTER — Ambulatory Visit: Payer: BC Managed Care – PPO | Attending: Family | Admitting: Family

## 2020-08-28 VITALS — BP 108/58 | HR 92 | Resp 20 | Ht 77.0 in | Wt 282.4 lb

## 2020-08-28 DIAGNOSIS — I428 Other cardiomyopathies: Secondary | ICD-10-CM | POA: Diagnosis not present

## 2020-08-28 DIAGNOSIS — Z89422 Acquired absence of other left toe(s): Secondary | ICD-10-CM | POA: Insufficient documentation

## 2020-08-28 DIAGNOSIS — E1159 Type 2 diabetes mellitus with other circulatory complications: Secondary | ICD-10-CM

## 2020-08-28 DIAGNOSIS — Z87891 Personal history of nicotine dependence: Secondary | ICD-10-CM | POA: Diagnosis not present

## 2020-08-28 DIAGNOSIS — F32A Depression, unspecified: Secondary | ICD-10-CM | POA: Diagnosis not present

## 2020-08-28 DIAGNOSIS — I110 Hypertensive heart disease with heart failure: Secondary | ICD-10-CM | POA: Insufficient documentation

## 2020-08-28 DIAGNOSIS — Z794 Long term (current) use of insulin: Secondary | ICD-10-CM | POA: Insufficient documentation

## 2020-08-28 DIAGNOSIS — Z79899 Other long term (current) drug therapy: Secondary | ICD-10-CM | POA: Insufficient documentation

## 2020-08-28 DIAGNOSIS — I5022 Chronic systolic (congestive) heart failure: Secondary | ICD-10-CM | POA: Diagnosis not present

## 2020-08-28 DIAGNOSIS — I10 Essential (primary) hypertension: Secondary | ICD-10-CM

## 2020-08-28 LAB — GLUCOSE, CAPILLARY: Glucose-Capillary: 114 mg/dL — ABNORMAL HIGH (ref 70–99)

## 2020-08-28 NOTE — Patient Instructions (Signed)
Watch sugars and check daily weights at home. Come in sooner if you need to
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
## 2020-08-30 ENCOUNTER — Ambulatory Visit: Payer: BC Managed Care – PPO | Admitting: Family

## 2020-09-04 ENCOUNTER — Ambulatory Visit: Payer: BC Managed Care – PPO | Admitting: Family

## 2020-09-11 DIAGNOSIS — Z794 Long term (current) use of insulin: Secondary | ICD-10-CM | POA: Diagnosis not present

## 2020-09-11 DIAGNOSIS — E119 Type 2 diabetes mellitus without complications: Secondary | ICD-10-CM | POA: Diagnosis not present

## 2020-09-19 ENCOUNTER — Other Ambulatory Visit: Payer: Self-pay | Admitting: Family

## 2020-09-19 ENCOUNTER — Telehealth (HOSPITAL_COMMUNITY): Payer: Self-pay

## 2020-09-19 DIAGNOSIS — I5022 Chronic systolic (congestive) heart failure: Secondary | ICD-10-CM

## 2020-09-19 MED ORDER — METOPROLOL SUCCINATE ER 25 MG PO TB24: 25.0000 mg | ORAL_TABLET | Freq: Every day | ORAL | 3 refills | Status: DC

## 2020-09-19 NOTE — Telephone Encounter (Signed)
Had a phone call from O[place] stating he missed his cardiology appt with Dr Clayborn Bigness on Monday and would like for me to reschedule it for him.  Also he states needs refills on his medications. Called and rescheduled his appt and advised [place].  Contacted his pharmacy and he had refills on all except Entresto and Metoprolol.  Contacted Tina with HF clinic and asked her to send refills.  She advised she would.  Jacson Rapaport they should be ready this evening.  He has not ran out yet but only has a couple left.  He states been doing well, he is at work today.  He denies having any problems.  Will continue to visit for heart failure.   [place] [phone]

## 2020-09-20 DIAGNOSIS — R600 Localized edema: Secondary | ICD-10-CM | POA: Diagnosis not present

## 2020-09-20 DIAGNOSIS — I509 Heart failure, unspecified: Secondary | ICD-10-CM | POA: Diagnosis not present

## 2020-09-20 DIAGNOSIS — E119 Type 2 diabetes mellitus without complications: Secondary | ICD-10-CM | POA: Diagnosis not present

## 2020-09-20 DIAGNOSIS — I10 Essential (primary) hypertension: Secondary | ICD-10-CM | POA: Diagnosis not present

## 2020-09-26 ENCOUNTER — Ambulatory Visit: Payer: BC Managed Care – PPO | Admitting: Family

## 2020-09-27 DIAGNOSIS — I10 Essential (primary) hypertension: Secondary | ICD-10-CM | POA: Diagnosis not present

## 2020-09-27 DIAGNOSIS — E669 Obesity, unspecified: Secondary | ICD-10-CM | POA: Diagnosis not present

## 2020-09-27 DIAGNOSIS — I509 Heart failure, unspecified: Secondary | ICD-10-CM | POA: Diagnosis not present

## 2020-09-27 DIAGNOSIS — Z794 Long term (current) use of insulin: Secondary | ICD-10-CM | POA: Diagnosis not present

## 2020-09-27 DIAGNOSIS — E119 Type 2 diabetes mellitus without complications: Secondary | ICD-10-CM | POA: Diagnosis not present

## 2020-10-02 DIAGNOSIS — Z20822 Contact with and (suspected) exposure to covid-19: Secondary | ICD-10-CM | POA: Diagnosis not present

## 2020-10-02 DIAGNOSIS — Z794 Long term (current) use of insulin: Secondary | ICD-10-CM | POA: Diagnosis not present

## 2020-10-02 DIAGNOSIS — E1100 Type 2 diabetes mellitus with hyperosmolarity without nonketotic hyperglycemic-hyperosmolar coma (NKHHC): Secondary | ICD-10-CM | POA: Diagnosis not present

## 2020-10-02 DIAGNOSIS — Z01812 Encounter for preprocedural laboratory examination: Secondary | ICD-10-CM | POA: Diagnosis not present

## 2020-10-04 DIAGNOSIS — I771 Stricture of artery: Secondary | ICD-10-CM | POA: Diagnosis not present

## 2020-10-04 DIAGNOSIS — N4889 Other specified disorders of penis: Secondary | ICD-10-CM | POA: Diagnosis not present

## 2020-10-04 DIAGNOSIS — Z6832 Body mass index (BMI) 32.0-32.9, adult: Secondary | ICD-10-CM | POA: Diagnosis not present

## 2020-10-04 DIAGNOSIS — Z794 Long term (current) use of insulin: Secondary | ICD-10-CM | POA: Diagnosis not present

## 2020-10-04 DIAGNOSIS — Z89429 Acquired absence of other toe(s), unspecified side: Secondary | ICD-10-CM | POA: Diagnosis not present

## 2020-10-04 DIAGNOSIS — Y831 Surgical operation with implant of artificial internal device as the cause of abnormal reaction of the patient, or of later complication, without mention of misadventure at the time of the procedure: Secondary | ICD-10-CM | POA: Diagnosis not present

## 2020-10-04 DIAGNOSIS — I110 Hypertensive heart disease with heart failure: Secondary | ICD-10-CM | POA: Diagnosis not present

## 2020-10-04 DIAGNOSIS — N529 Male erectile dysfunction, unspecified: Secondary | ICD-10-CM | POA: Diagnosis not present

## 2020-10-04 DIAGNOSIS — E669 Obesity, unspecified: Secondary | ICD-10-CM | POA: Diagnosis not present

## 2020-10-04 DIAGNOSIS — I509 Heart failure, unspecified: Secondary | ICD-10-CM | POA: Diagnosis not present

## 2020-10-04 DIAGNOSIS — E119 Type 2 diabetes mellitus without complications: Secondary | ICD-10-CM | POA: Diagnosis not present

## 2020-10-04 DIAGNOSIS — T83420A Displacement of penile (implanted) prosthesis, initial encounter: Secondary | ICD-10-CM | POA: Diagnosis not present

## 2020-10-04 DIAGNOSIS — I428 Other cardiomyopathies: Secondary | ICD-10-CM | POA: Diagnosis not present

## 2020-10-04 DIAGNOSIS — Z79899 Other long term (current) drug therapy: Secondary | ICD-10-CM | POA: Diagnosis not present

## 2020-10-07 DIAGNOSIS — R339 Retention of urine, unspecified: Secondary | ICD-10-CM | POA: Diagnosis not present

## 2020-11-06 ENCOUNTER — Telehealth (HOSPITAL_COMMUNITY): Payer: Self-pay

## 2020-11-06 DIAGNOSIS — T83410D Breakdown (mechanical) of penile (implanted) prosthesis, subsequent encounter: Secondary | ICD-10-CM | POA: Diagnosis not present

## 2020-11-06 DIAGNOSIS — Z09 Encounter for follow-up examination after completed treatment for conditions other than malignant neoplasm: Secondary | ICD-10-CM | POA: Diagnosis not present

## 2020-11-06 DIAGNOSIS — N529 Male erectile dysfunction, unspecified: Secondary | ICD-10-CM | POA: Diagnosis not present

## 2020-11-19 NOTE — Telephone Encounter (Signed)
Spoke with Debby Bud today.  He is feeling better since his surgery.  He denies any problems.  He states has all his medications and does not need any refills.  He is returning to work Advertising account executive.  He denies chest pain, headaches, dizziness or shortness of breath.  He denies any fluid in extremities or fullness in abdomen.  He is aware of up coming appts. He states his sugars has been good.  Will continue to visit for heart failure and medication compliance.   Earmon Phoenix Wister EMT-Paramedic [phone]

## 2020-11-21 DIAGNOSIS — R059 Cough, unspecified: Secondary | ICD-10-CM | POA: Diagnosis not present

## 2020-11-21 DIAGNOSIS — J9811 Atelectasis: Secondary | ICD-10-CM | POA: Diagnosis not present

## 2020-11-21 DIAGNOSIS — Z03818 Encounter for observation for suspected exposure to other biological agents ruled out: Secondary | ICD-10-CM | POA: Diagnosis not present

## 2020-11-21 DIAGNOSIS — R509 Fever, unspecified: Secondary | ICD-10-CM | POA: Diagnosis not present

## 2020-11-27 NOTE — Progress Notes (Deleted)
Patient ID: Gary Bentley, male    DOB: 05-24-1971, 50 y.o.   MRN: [id number]  HPI  Gary Bentley is a 50 y/o male with a history of DM, HTN, previous tobacco use and chronic heart failure.   Echo report from 01/10/2019 reviewed and showed an EF of 15-20% along with trivial AR and mild pulmonic stenosis.   Catheterization done 01/13/2019 showed: Nonischemic cardiomyopathy, no significant CAD Mildly elevated wedge pressure,  EF severely depressed, global Hypokinesis, EF 20%  Has not been admitted or been in the ED in the last 6 months.   He presents today for a follow-up visit with a chief complaint of    Past Medical History:  Diagnosis Date  . CHF (congestive heart failure) ([place])   . Diabetes mellitus without complication ([place])   . Hypertension    Past Surgical History:  Procedure Laterality Date  . AMPUTATION TOE Left 01/05/2020   Procedure: AMPUTATION TOE MPJ LEFT;  Surgeon: Samara Deist, DPM;  Location: [facility];  Service: Podiatry;  Laterality: Left;  . FOOT SURGERY    . RIGHT/LEFT HEART CATH AND CORONARY ANGIOGRAPHY N/A 01/13/2019   Procedure: RIGHT/LEFT HEART CATH AND CORONARY ANGIOGRAPHY;  Surgeon: Minna Merritts, MD;  Location: [place] [facility];  Service: Cardiovascular;  Laterality: N/A;   Family History  Family history unknown: Yes   Social History   Tobacco Use  . Smoking status: Former Smoker    Packs/day: 1.00    Types: Cigarettes    Quit date: 04/17/2017    Years since quitting: 3.6  . Smokeless tobacco: Never Used  Substance Use Topics  . Alcohol use: No   Allergies  Allergen Reactions  . Bee Venom Anaphylaxis      Review of Systems  Constitutional: Negative for appetite change and fatigue. Positive for weight gain due to lifestyle/diet changes. HENT: Negative for congestion, postnasal drip and sore throat.   Eyes: Negative.   Respiratory: Positive for shortness of breath (With higher level of exertion, reports this has improved overall).  Negative for cough.   Cardiovascular:Negative for leg swelling. Negative for chest pain and palpitations.  Gastrointestinal: Slight abdominal distention and denies abdominal pain.  Endocrine: Negative.   Genitourinary: Negative.   Musculoskeletal: Negative for back pain and neck pain.  Skin: Negative.   Allergic/Immunologic: Negative.   Neurological: Negative for dizziness and light-headedness.  Hematological: Negative for adenopathy. Does not bruise/bleed easily.  Psychiatric/Behavioral: Negative for dysphoric mood and sleep disturbance (sleeping on 2 pillows). The patient is not nervous/anxious.       Physical Exam Vitals and nursing note reviewed.  Constitutional:      Appearance: Normal appearance.  HENT:     Head: Normocephalic and atraumatic.  Cardiovascular:     Rate and Rhythm: Normal rate and regular rhythm.  Pulmonary:     Effort: Pulmonary effort is normal.     Breath sounds: No wheezing, rhonchi or rales.  Abdominal:     General: There is slight distension.     Palpations: Abdomen is soft.     Tenderness: There is no abdominal tenderness.  Musculoskeletal:        General: No tenderness.     Cervical back: Normal range of motion and neck supple.     Right lower leg: No edema present.     Left lower leg:No Edema present.  Skin:    General: Skin is warm and dry.  Neurological:     General: No focal deficit present.  Mental Status: He is alert and oriented to person, place, and time.  Psychiatric:        Mood and Affect: Mood normal.        Behavior: Behavior normal.        Thought Content: Thought content normal.     Assessment & Plan:  1: Chronic heart failure with reduced ejection fraction- - NYHA class II - euvolemic today - weighing daily; reminded to call for an overnight weight gain of >2 pounds or a weekly weight gain of >5 pounds - weight up 5 pounds from last visit here 2 months ago - not adding salt and is trying to limit his sodium and  sweet intake - saw cardiology Gary Bentley) 06/03/20 - participating in paramedicine program  - BNP 07/10/2019 was 968.0 - does not take the flu vaccine; good handwashing encouraged  2: HTN- - BP on the low side today  - saw PCP Gary Bentley) 06/12/20 - BMP 08/09/20 reviewed and showed sodium 134, potassium 4.6, creatinine 1.49, GFR 64  3: DM- - A1c 05/23/20 8.5 - nonfasting glucose in clinic today was 114 - Re-iterated the importance of controlling carb and sugar intake    Patient did not bring his medications nor a list. Each medication was verbally reviewed with the patient and he was encouraged to bring the bottles to every visit to confirm accuracy of list.   Return in 3 months or sooner for any questions/problems before then.

## 2020-11-28 ENCOUNTER — Ambulatory Visit: Payer: BC Managed Care – PPO | Admitting: Family

## 2020-12-11 NOTE — Progress Notes (Signed)
Patient ID: Gary Bentley, male    DOB: 26-Jun-1971, 50 y.o.   MRN: [id number]  HPI  Gary Bentley is a 50 y/o male with a history of DM, HTN, previous tobacco use and chronic heart failure.   Echo report from 06/03/20 reviewed and showed an EF of 30% along with mild Gary. Echo report from 01/10/2019 reviewed and showed an EF of 15-20% along with trivial AR and mild pulmonic stenosis.   Catheterization done 01/13/2019 showed: Nonischemic cardiomyopathy, no significant CAD Mildly elevated wedge pressure,  EF severely depressed, global Hypokinesis, EF 20%  Has not been admitted or been in the ED in the last 6 months.   He presents today with a chief complaint of a follow-up visit. He currently has no symptoms and specifically denies any difficulty sleeping, dizziness, abdominal distention, chest pain, shortness of breath, cough, fatigue or rapid weight gain. Does note that he's gained some weight but says that it's been slowly and because he's been eating too much.   Says that he's been out of his furosemide and metoprolol for ~ the last month.   Past Medical History:  Diagnosis Date  . CHF (congestive heart failure) ([place])   . Diabetes mellitus without complication ([place])   . Hypertension    Past Surgical History:  Procedure Laterality Date  . AMPUTATION TOE Left 01/05/2020   Procedure: AMPUTATION TOE MPJ LEFT;  Surgeon: Samara Deist, DPM;  Location: [facility];  Service: Podiatry;  Laterality: Left;  . FOOT SURGERY    . RIGHT/LEFT HEART CATH AND CORONARY ANGIOGRAPHY N/A 01/13/2019   Procedure: RIGHT/LEFT HEART CATH AND CORONARY ANGIOGRAPHY;  Surgeon: Minna Merritts, MD;  Location: [place];  Service: Cardiovascular;  Laterality: N/A;   Family History  Family history unknown: Yes   Social History   Tobacco Use  . Smoking status: Former Smoker    Packs/day: 1.00    Types: Cigarettes    Quit date: 04/17/2017    Years since quitting: 3.6  . Smokeless tobacco: Never Used   Substance Use Topics  . Alcohol use: No   Allergies  Allergen Reactions  . Bee Venom Anaphylaxis   Prior to Admission medications   Medication Sig Start Date End Date Taking? Authorizing Provider  ENTRESTO 24-26 MG Take 1 tablet by mouth twice daily 09/19/20  Yes Layli Capshaw, Otila Kluver A, FNP  insulin aspart protamine - aspart (NOVOLOG MIX 70/30 FLEXPEN) (70-30) 100 UNIT/ML FlexPen Inject 0.1 mLs (10 Units total) into the skin 2 (two) times daily. Patient taking differently: Inject 15 Units into the skin 2 (two) times daily. 01/15/19  Yes Dustin Flock, MD  isosorbide mononitrate (IMDUR) 30 MG 24 hr tablet Take 1 tablet (30 mg total) by mouth daily. 01/08/20  Yes Darylene Price A, FNP  furosemide (LASIX) 40 MG tablet Take 1 tablet (40 mg total) by mouth 2 (two) times daily. Patient not taking: Reported on 12/12/2020 06/03/20 06/03/21  Alisa Graff, FNP  metoprolol succinate (TOPROL-XL) 25 MG 24 hr tablet Take 1 tablet (25 mg total) by mouth daily. Patient not taking: Reported on 12/12/2020 09/19/20   Darylene Price A, FNP  potassium chloride SA (KLOR-CON) 20 MEQ tablet Take 1 tablet (20 mEq total) by mouth daily. 07/17/20   Alisa Graff, FNP    Review of Systems  Constitutional: Negative for appetite change and fatigue. Positive for weight gain due to lifestyle/diet changes. HENT: Negative for congestion, postnasal drip and sore throat.   Eyes: Negative.   Respiratory:  Negative for shortness of breath and cough.   Cardiovascular:Negative for leg swelling. Negative for chest pain and palpitations.  Gastrointestinal: Slight abdominal distention and denies abdominal pain.  Endocrine: Negative.   Genitourinary: Negative.   Musculoskeletal: Negative for back pain and neck pain.  Skin: Negative.   Allergic/Immunologic: Negative.   Neurological: Negative for dizziness and light-headedness.  Hematological: Negative for adenopathy. Does not bruise/bleed easily.  Psychiatric/Behavioral: Negative for  dysphoric mood and sleep disturbance (sleeping on 2 pillows). The patient is not nervous/anxious.    Vitals:   12/12/20 0918  BP: 131/67  Pulse: (!) 104  Resp: 20  SpO2: 100%  Weight: 291 lb (132 kg)  Height: 6\' 5"  (1.956 m)   Wt Readings from Last 3 Encounters:  12/12/20 291 lb (132 kg)  08/28/20 282 lb 6 oz (128.1 kg)  04/09/20 270 lb (122.5 kg)   Lab Results  Component Value Date   CREATININE 1.11 03/20/2020   CREATININE 1.00 01/05/2020   CREATININE 1.04 11/13/2019    Physical Exam Vitals and nursing note reviewed.  Constitutional:      Appearance: Normal appearance.  HENT:     Head: Normocephalic and atraumatic.  Cardiovascular:     Rate and Rhythm: regular rhythm.     Tachycardic Pulmonary:     Effort: Pulmonary effort is normal.     Breath sounds: No wheezing, rhonchi or rales.  Abdominal:     General: There is slight distension.     Palpations: Abdomen is soft.     Tenderness: There is no abdominal tenderness.  Musculoskeletal:        General: No tenderness.     Cervical back: Normal range of motion and neck supple.     Right lower leg: No edema present.     Left lower leg:No Edema present.  Skin:    General: Skin is warm and dry.  Neurological:     General: No focal deficit present.     Mental Status: He is alert and oriented to person, place, and time.  Psychiatric:        Mood and Affect: Mood normal.        Behavior: Behavior normal.        Thought Content: Thought content normal.     Assessment & Plan:  1: Chronic heart failure with reduced ejection fraction- - NYHA class I - euvolemic today - weighing daily; reminded to call for an overnight weight gain of >2 pounds or a weekly weight gain of >5 pounds - weight up 9 pounds from last visit here 3 months ago; admits to eating more than he should be - not adding salt and is trying to limit his sodium and sweet intake - saw cardiology [name]) 09/27/20 - has been out of furosemide for the last  month; new RX sent in - tachycardic today; has been out of metoprolol for the last month; explained that a 90 day RX was sent in back in October with 3 refills which would be for a whole year - discussed titrating entresto/ metoprolol and/ or adding farxiga/ spironolactone at next visit - participating in paramedicine program  - BNP 07/10/2019 was 968.0 - does not take the flu vaccine; good handwashing encouraged  2: HTN- - BP looks good today - saw PCP [name]) 09/20/20 - BMP 09/27/20 reviewed and showed sodium 138, potassium 4.5, creatinine 1.3, GFR 72  3: DM- - A1c 09/11/20 9.4% - fasting glucose at home today was 115   Medication bottles reviewed.  Asked that he call us before he runs out of any medications  Return in 1 month or sooner for any questions/problems before then.

## 2020-12-12 ENCOUNTER — Encounter: Payer: Self-pay | Admitting: Family

## 2020-12-12 ENCOUNTER — Ambulatory Visit: Payer: BC Managed Care – PPO | Attending: Family | Admitting: Family

## 2020-12-12 ENCOUNTER — Other Ambulatory Visit: Payer: Self-pay

## 2020-12-12 VITALS — BP 131/67 | HR 104 | Resp 20 | Ht 77.0 in | Wt 291.0 lb

## 2020-12-12 DIAGNOSIS — I110 Hypertensive heart disease with heart failure: Secondary | ICD-10-CM | POA: Diagnosis not present

## 2020-12-12 DIAGNOSIS — I5022 Chronic systolic (congestive) heart failure: Secondary | ICD-10-CM | POA: Diagnosis not present

## 2020-12-12 DIAGNOSIS — Z794 Long term (current) use of insulin: Secondary | ICD-10-CM

## 2020-12-12 DIAGNOSIS — E119 Type 2 diabetes mellitus without complications: Secondary | ICD-10-CM | POA: Insufficient documentation

## 2020-12-12 DIAGNOSIS — I428 Other cardiomyopathies: Secondary | ICD-10-CM | POA: Diagnosis not present

## 2020-12-12 DIAGNOSIS — E1159 Type 2 diabetes mellitus with other circulatory complications: Secondary | ICD-10-CM

## 2020-12-12 DIAGNOSIS — R000 Tachycardia, unspecified: Secondary | ICD-10-CM | POA: Diagnosis not present

## 2020-12-12 DIAGNOSIS — Z87891 Personal history of nicotine dependence: Secondary | ICD-10-CM | POA: Diagnosis not present

## 2020-12-12 DIAGNOSIS — I10 Essential (primary) hypertension: Secondary | ICD-10-CM

## 2020-12-12 MED ORDER — FUROSEMIDE 40 MG PO TABS: 40.0000 mg | ORAL_TABLET | Freq: Two times a day (BID) | ORAL | 3 refills | Status: DC

## 2020-12-12 NOTE — Patient Instructions (Addendum)
Continue weighing daily and call for an overnight weight gain of > 2 pounds or a weekly weight gain of >5 pounds.   Pick up lasix (furosemide) and metoprolol from [facility]

## 2020-12-13 ENCOUNTER — Ambulatory Visit: Payer: BC Managed Care – PPO | Admitting: Family

## 2020-12-17 ENCOUNTER — Telehealth (HOSPITAL_COMMUNITY): Payer: Self-pay

## 2020-12-17 NOTE — Telephone Encounter (Signed)
Spoke with Gary Bentley today.  He states doing well.  He states he picked his meds up, he states has all of them.  Verified them.  He is aware of up coming appts.  Will continue to visit for heart failure and medication compliance.   [place] [phone]

## 2020-12-19 DIAGNOSIS — L97522 Non-pressure chronic ulcer of other part of left foot with fat layer exposed: Secondary | ICD-10-CM | POA: Diagnosis not present

## 2020-12-19 DIAGNOSIS — E11621 Type 2 diabetes mellitus with foot ulcer: Secondary | ICD-10-CM | POA: Diagnosis not present

## 2020-12-19 DIAGNOSIS — E1142 Type 2 diabetes mellitus with diabetic polyneuropathy: Secondary | ICD-10-CM | POA: Diagnosis not present

## 2020-12-26 DIAGNOSIS — L97522 Non-pressure chronic ulcer of other part of left foot with fat layer exposed: Secondary | ICD-10-CM | POA: Diagnosis not present

## 2020-12-26 DIAGNOSIS — E1142 Type 2 diabetes mellitus with diabetic polyneuropathy: Secondary | ICD-10-CM | POA: Diagnosis not present

## 2020-12-26 DIAGNOSIS — M216X2 Other acquired deformities of left foot: Secondary | ICD-10-CM | POA: Diagnosis not present

## 2021-01-13 ENCOUNTER — Other Ambulatory Visit: Payer: Self-pay | Admitting: Family

## 2021-01-13 ENCOUNTER — Encounter (HOSPITAL_COMMUNITY): Payer: Self-pay

## 2021-01-13 ENCOUNTER — Telehealth (HOSPITAL_COMMUNITY): Payer: Self-pay

## 2021-01-13 NOTE — Telephone Encounter (Signed)
Gary Bentley called stating he needs refills on potassium.  He states is running out and has no refills on them.  Will contact [place] with HF clinic to see if send to his pharmacy.  He states doing well.  He is planning on going to his HF clinic appt tomorrow.   [name] [phone]

## 2021-01-14 ENCOUNTER — Ambulatory Visit: Payer: BC Managed Care – PPO | Attending: Family | Admitting: Family

## 2021-01-14 ENCOUNTER — Other Ambulatory Visit: Payer: Self-pay

## 2021-01-14 ENCOUNTER — Encounter: Payer: Self-pay | Admitting: Family

## 2021-01-14 VITALS — BP 135/90 | HR 89 | Resp 20 | Ht 77.0 in | Wt 304.0 lb

## 2021-01-14 DIAGNOSIS — I5022 Chronic systolic (congestive) heart failure: Secondary | ICD-10-CM

## 2021-01-14 DIAGNOSIS — Z87891 Personal history of nicotine dependence: Secondary | ICD-10-CM | POA: Insufficient documentation

## 2021-01-14 DIAGNOSIS — E1159 Type 2 diabetes mellitus with other circulatory complications: Secondary | ICD-10-CM

## 2021-01-14 DIAGNOSIS — Z794 Long term (current) use of insulin: Secondary | ICD-10-CM | POA: Diagnosis not present

## 2021-01-14 DIAGNOSIS — I509 Heart failure, unspecified: Secondary | ICD-10-CM | POA: Diagnosis not present

## 2021-01-14 DIAGNOSIS — I110 Hypertensive heart disease with heart failure: Secondary | ICD-10-CM | POA: Diagnosis not present

## 2021-01-14 DIAGNOSIS — I10 Essential (primary) hypertension: Secondary | ICD-10-CM

## 2021-01-14 LAB — GLUCOSE, CAPILLARY: Glucose-Capillary: 165 mg/dL — ABNORMAL HIGH (ref 70–99)

## 2021-01-14 MED ORDER — SACUBITRIL-VALSARTAN 49-51 MG PO TABS: 1.0000 | ORAL_TABLET | Freq: Two times a day (BID) | ORAL | 5 refills | Status: DC

## 2021-01-14 NOTE — Patient Instructions (Signed)
Continue weighing daily and call for an overnight weight gain of > 2 pounds or a weekly weight gain of >5 pounds.   Finish your current entresto bottle. You will then pick up the new dose of entresto (49/51mg ) and then continue taking it as 1 tablet twice a day

## 2021-01-14 NOTE — Progress Notes (Signed)
Patient ID: Gary Bentley, male    DOB: 01-May-1971, 50 y.o.   MRN: [id number]   Gary Bentley is a 50 y/o male with a history of DM, HTN, previous tobacco use and chronic heart failure.   Echo report from 06/03/20 reviewed and showed an EF of 30% along with mild Gary. Echo report from 01/10/2019 reviewed and showed an EF of 15-20% along with trivial AR and mild pulmonic stenosis.   Catheterization done 01/13/2019 showed: Nonischemic cardiomyopathy, no significant CAD Mildly elevated wedge pressure,  EF severely depressed, global Hypokinesis, EF 20%  Has not been admitted or been in the ED in the last 6 months.   He presents today for a follow-up visit with a chief complaint of minimal swelling in left lower leg. He has associated slight weight gain along with this. He denies any difficulty sleeping, abdominal distention, palpitations, chest pain, dizziness, shortness of breath, cough or fatigue.   Recently sprained his ankle/ left lower leg playing basketball and is currently wearing a boot on the left leg.   Past Medical History:  Diagnosis Date  . CHF (congestive heart failure) ([place])   . Diabetes mellitus without complication ([place])   . Hypertension    Past Surgical History:  Procedure Laterality Date  . AMPUTATION TOE Left 01/05/2020   Procedure: AMPUTATION TOE MPJ LEFT;  Surgeon: Samara Deist, DPM;  Location: [facility];  Service: Podiatry;  Laterality: Left;  . FOOT SURGERY    . RIGHT/LEFT HEART CATH AND CORONARY ANGIOGRAPHY N/A 01/13/2019   Procedure: RIGHT/LEFT HEART CATH AND CORONARY ANGIOGRAPHY;  Surgeon: Minna Merritts, MD;  Location: [facility];  Service: Cardiovascular;  Laterality: N/A;   Family History  Family history unknown: Yes   Social History   Tobacco Use  . Smoking status: Former Smoker    Packs/day: 1.00    Types: Cigarettes    Quit date: 04/17/2017    Years since quitting: 3.7  . Smokeless tobacco: Never Used  Substance Use Topics  . Alcohol  use: No   Allergies  Allergen Reactions  . Bee Venom Anaphylaxis    Prior to Admission medications   Medication Sig Start Date End Date Taking? Authorizing Provider  ENTRESTO 24-26 MG Take 1 tablet by mouth twice daily 09/19/20  Yes Darylene Price A, FNP  furosemide (LASIX) 40 MG tablet Take 1 tablet (40 mg total) by mouth 2 (two) times daily. 12/12/20 12/12/21 Yes Choice Kleinsasser, Otila Kluver A, FNP  insulin aspart protamine - aspart (NOVOLOG MIX 70/30 FLEXPEN) (70-30) 100 UNIT/ML FlexPen Inject 0.1 mLs (10 Units total) into the skin 2 (two) times daily. Patient taking differently: Inject 15 Units into the skin 2 (two) times daily. 01/15/19  Yes Dustin Flock, MD  isosorbide mononitrate (IMDUR) 30 MG 24 hr tablet Take 1 tablet (30 mg total) by mouth daily. 01/08/20  Yes Darylene Price A, FNP  metoprolol succinate (TOPROL-XL) 25 MG 24 hr tablet Take 1 tablet (25 mg total) by mouth daily. 09/19/20  Yes Alexica Schlossberg, Otila Kluver A, FNP  potassium chloride SA (KLOR-CON) 20 MEQ tablet Take 1 tablet (20 mEq total) by mouth daily. 07/17/20  Yes Alisa Graff, FNP     Review of Systems  Constitutional: Negative for appetite change and fatigue.  HENT: Negative for congestion, postnasal drip and sore throat.   Eyes: Negative.   Respiratory: Negative for cough and shortness of breath.   Cardiovascular: Positive for leg swelling (left lower leg). Negative for chest pain and palpitations.  Gastrointestinal: Negative  for abdominal distention and abdominal pain.  Endocrine: Negative.   Genitourinary: Negative.   Musculoskeletal: Negative for arthralgias, back pain and neck pain.  Skin: Negative.   Allergic/Immunologic: Negative.   Neurological: Negative for dizziness and light-headedness.  Hematological: Negative for adenopathy. Does not bruise/bleed easily.  Psychiatric/Behavioral: Negative for dysphoric mood and sleep disturbance (sleeping on 2 pillows). The patient is not nervous/anxious.    Vitals:   01/14/21 0933  BP:  135/90  Pulse: 89  Resp: 20  SpO2: 100%  Weight: (!) 304 lb (137.9 kg)  Height: 6\' 5"  (1.956 m)   Wt Readings from Last 3 Encounters:  01/14/21 (!) 304 lb (137.9 kg)  12/12/20 291 lb (132 kg)  08/28/20 282 lb 6 oz (128.1 kg)   Lab Results  Component Value Date   CREATININE 1.11 03/20/2020   CREATININE 1.00 01/05/2020   CREATININE 1.04 11/13/2019   Physical Exam Vitals and nursing note reviewed.  Constitutional:      Appearance: Normal appearance.  HENT:     Head: Normocephalic and atraumatic.  Cardiovascular:     Rate and Rhythm: Normal rate and regular rhythm.  Pulmonary:     Effort: Pulmonary effort is normal.     Breath sounds: No wheezing, rhonchi or rales.  Abdominal:     General: There is no distension.     Palpations: Abdomen is soft.     Tenderness: There is no abdominal tenderness.  Musculoskeletal:        General: No tenderness.     Cervical back: Normal range of motion and neck supple.     Right lower leg: No edema.     Comments: Currently has a boot on the left lower leg  Skin:    General: Skin is warm and dry.  Neurological:     General: No focal deficit present.     Mental Status: He is alert and oriented to person, place, and time.  Psychiatric:        Mood and Affect: Mood normal.        Behavior: Behavior normal.        Thought Content: Thought content normal.     Assessment & Plan:  1: Chronic heart failure with reduced ejection fraction- - NYHA class I - euvolemic today - weighing daily; reminded to call for an overnight weight gain of >2 pounds or a weekly weight gain of >5 pounds - weight up 13 pounds from last visit here 1 month ago - he says that he's not been as active since he's been wearing the boot on his left leg - not adding salt and is trying to limit his sodium intake - saw cardiology Clayborn Bigness) 09/27/20 - participating in paramedicine program  - increase entresto to 49/51mg  BID; he is to finish his current bottle of 24/26mg   as he says that he doesn't have much left - will check BMP at his next visit - consider further titration of entresto, metoprolol at future visits as well as add spironolactone/ farxiga if able - BNP 07/10/2019 was 968.0 - does not take the flu vaccine; good handwashing encouraged  2: HTN- - BP mildly elevated; increasing entresto per above - saw PCP [name]) 09/20/20 - BMP 09/27/20 reviewed and showed sodium 138, potassium 4.5, creatinine 1.3 and GFR 72  3: DM- - A1c 09/11/20 was 9.4% - nonfasting glucose in clinic today was 165   Patient did not bring his medications nor a list. Each medication was verbally reviewed with the patient and  he was encouraged to bring the bottles to every visit to confirm accuracy of list.   Return in 1 month or sooner for any questions/problems before then.

## 2021-01-24 ENCOUNTER — Telehealth (HOSPITAL_COMMUNITY): Payer: Self-pay

## 2021-01-30 DIAGNOSIS — N529 Male erectile dysfunction, unspecified: Secondary | ICD-10-CM | POA: Diagnosis not present

## 2021-01-30 DIAGNOSIS — Z6835 Body mass index (BMI) 35.0-35.9, adult: Secondary | ICD-10-CM | POA: Diagnosis not present

## 2021-02-08 NOTE — Progress Notes (Deleted)
Patient ID: Gary Bentley, male    DOB: 1971/01/27, 50 y.o.   MRN: [id number]   Mr Mooneyhan is a 50 y/o male with a history of DM, HTN, previous tobacco use and chronic heart failure.   Echo report from 06/03/20 reviewed and showed an EF of 30% along with mild MR. Echo report from 01/10/2019 reviewed and showed an EF of 15-20% along with trivial AR and mild pulmonic stenosis.   Catheterization done 01/13/2019 showed: Nonischemic cardiomyopathy, no significant CAD Mildly elevated wedge pressure,  EF severely depressed, global Hypokinesis, EF 20%  Has not been admitted or been in the ED in the last 6 months.   He presents today for a follow-up visit with a chief complaint of   Past Medical History:  Diagnosis Date  . CHF (congestive heart failure) ([place])   . Diabetes mellitus without complication ([place])   . Hypertension    Past Surgical History:  Procedure Laterality Date  . AMPUTATION TOE Left 01/05/2020   Procedure: AMPUTATION TOE MPJ LEFT;  Surgeon: Samara Deist, DPM;  Location: [facility];  Service: Podiatry;  Laterality: Left;  . FOOT SURGERY    . RIGHT/LEFT HEART CATH AND CORONARY ANGIOGRAPHY N/A 01/13/2019   Procedure: RIGHT/LEFT HEART CATH AND CORONARY ANGIOGRAPHY;  Surgeon: Minna Merritts, MD;  Location: [facility];  Service: Cardiovascular;  Laterality: N/A;   Family History  Family history unknown: Yes   Social History   Tobacco Use  . Smoking status: Former Smoker    Packs/day: 1.00    Types: Cigarettes    Quit date: 04/17/2017    Years since quitting: 3.8  . Smokeless tobacco: Never Used  Substance Use Topics  . Alcohol use: No   Allergies  Allergen Reactions  . Bee Venom Anaphylaxis       Review of Systems  Constitutional: Negative for appetite change and fatigue.  HENT: Negative for congestion, postnasal drip and sore throat.   Eyes: Negative.   Respiratory: Negative for cough and shortness of breath.   Cardiovascular: Positive for leg  swelling (left lower leg). Negative for chest pain and palpitations.  Gastrointestinal: Negative for abdominal distention and abdominal pain.  Endocrine: Negative.   Genitourinary: Negative.   Musculoskeletal: Negative for arthralgias, back pain and neck pain.  Skin: Negative.   Allergic/Immunologic: Negative.   Neurological: Negative for dizziness and light-headedness.  Hematological: Negative for adenopathy. Does not bruise/bleed easily.  Psychiatric/Behavioral: Negative for dysphoric mood and sleep disturbance (sleeping on 2 pillows). The patient is not nervous/anxious.     Physical Exam Vitals and nursing note reviewed.  Constitutional:      Appearance: Normal appearance.  HENT:     Head: Normocephalic and atraumatic.  Cardiovascular:     Rate and Rhythm: Normal rate and regular rhythm.  Pulmonary:     Effort: Pulmonary effort is normal.     Breath sounds: No wheezing, rhonchi or rales.  Abdominal:     General: There is no distension.     Palpations: Abdomen is soft.     Tenderness: There is no abdominal tenderness.  Musculoskeletal:        General: No tenderness.     Cervical back: Normal range of motion and neck supple.     Right lower leg: No edema.     Comments: Currently has a boot on the left lower leg  Skin:    General: Skin is warm and dry.  Neurological:     General: No focal deficit present.  Mental Status: He is alert and oriented to person, place, and time.  Psychiatric:        Mood and Affect: Mood normal.        Behavior: Behavior normal.        Thought Content: Thought content normal.     Assessment & Plan:  1: Chronic heart failure with reduced ejection fraction- - NYHA class I - euvolemic today - weighing daily; reminded to call for an overnight weight gain of >2 pounds or a weekly weight gain of >5 pounds - weight 304 pounds from last visit here 1 month ago - he says that he's not been as active since he's been wearing the boot on his left  leg - not adding salt and is trying to limit his sodium intake - saw cardiology Clayborn Bigness) 09/27/20 - participating in paramedicine program   - will check BMP today as entresto was increased to 49/51mg  at last visit - consider further titration of entresto, metoprolol at future visits as well as add spironolactone/ farxiga if able - BNP 07/10/2019 was 968.0 - does not take the flu vaccine; good handwashing encouraged  2: HTN- - BP  - saw PCP [name]) 09/20/20 - BMP 09/27/20 reviewed and showed sodium 138, potassium 4.5, creatinine 1.3 and GFR 72  3: DM- - A1c 09/11/20 was 9.4% - nonfasting glucose in clinic today was    Patient did not bring his medications nor a list. Each medication was verbally reviewed with the patient and he was encouraged to bring the bottles to every visit to confirm accuracy of list.

## 2021-02-11 ENCOUNTER — Ambulatory Visit: Payer: BC Managed Care – PPO | Admitting: Family

## 2021-02-13 ENCOUNTER — Ambulatory Visit: Payer: BC Managed Care – PPO | Admitting: Family

## 2021-02-18 DIAGNOSIS — E1142 Type 2 diabetes mellitus with diabetic polyneuropathy: Secondary | ICD-10-CM | POA: Diagnosis not present

## 2021-02-18 DIAGNOSIS — L97522 Non-pressure chronic ulcer of other part of left foot with fat layer exposed: Secondary | ICD-10-CM | POA: Diagnosis not present

## 2021-02-18 DIAGNOSIS — B351 Tinea unguium: Secondary | ICD-10-CM | POA: Diagnosis not present

## 2021-02-20 ENCOUNTER — Telehealth (HOSPITAL_COMMUNITY): Payer: Self-pay

## 2021-02-20 NOTE — Telephone Encounter (Signed)
Today had a phone call from [name].   He said he needed refill on his Entresto.  He said he will be out tomorrow.  Contacted pharmacy and he has refills, they advised will be ready this evening.  Gary Bentley and he said he will pick it up after work today.  He states been doing well, reminded him of HF appt this month.  He is aware.  He states been working a lot and he has moved.  Discussed with Otila Kluver at HF clinic, he works and all physicians is in [place], he moved to [place] but I usually visit with him at his work.  Will continue to visit him for heart failure.   [place] [phone]

## 2021-02-20 NOTE — Telephone Encounter (Signed)
Gary Bentley contacted me and advised could not make his HF clinic appt due to another appt same date and time.  Contacted Tina with HF clinic and she has rescheduled his appt for later in April.  He states been doing well.  He states has all his medications.  [place] [phone]

## 2021-02-28 DIAGNOSIS — E119 Type 2 diabetes mellitus without complications: Secondary | ICD-10-CM | POA: Diagnosis not present

## 2021-02-28 DIAGNOSIS — I10 Essential (primary) hypertension: Secondary | ICD-10-CM | POA: Diagnosis not present

## 2021-02-28 DIAGNOSIS — Z0001 Encounter for general adult medical examination with abnormal findings: Secondary | ICD-10-CM | POA: Diagnosis not present

## 2021-02-28 DIAGNOSIS — I509 Heart failure, unspecified: Secondary | ICD-10-CM | POA: Diagnosis not present

## 2021-03-08 DIAGNOSIS — J302 Other seasonal allergic rhinitis: Secondary | ICD-10-CM | POA: Diagnosis not present

## 2021-03-13 ENCOUNTER — Other Ambulatory Visit: Payer: Self-pay | Admitting: Family

## 2021-03-14 ENCOUNTER — Telehealth: Payer: Self-pay | Admitting: Family

## 2021-03-14 ENCOUNTER — Ambulatory Visit: Payer: BC Managed Care – PPO | Admitting: Family

## 2021-03-14 NOTE — Telephone Encounter (Signed)
Patient did not show for his Heart Failure Clinic appointment on 03/14/21. Will attempt to reschedule.

## 2021-03-18 ENCOUNTER — Telehealth (HOSPITAL_COMMUNITY): Payer: Self-pay

## 2021-03-18 NOTE — Telephone Encounter (Signed)
Spoke with Gary Bentley about missing his HF clinic appt.  He was aware he missed it and wants it rescheduled, rescheduled it for him and advised him.  Called in refills for his meds.  He states doing well and has everything he needs right now.  Been busy at work.  Will continue to visit for heart failure.   [place] [phone]

## 2021-04-01 ENCOUNTER — Other Ambulatory Visit: Payer: Self-pay

## 2021-04-01 ENCOUNTER — Other Ambulatory Visit
Admission: RE | Admit: 2021-04-01 | Discharge: 2021-04-01 | Disposition: A | Discharge status: Home / Self Care | Payer: BC Managed Care – PPO | Source: Ambulatory Visit | Attending: Family | Admitting: Family

## 2021-04-01 ENCOUNTER — Ambulatory Visit (HOSPITAL_BASED_OUTPATIENT_CLINIC_OR_DEPARTMENT_OTHER): Payer: BC Managed Care – PPO | Admitting: Family

## 2021-04-01 ENCOUNTER — Encounter: Payer: Self-pay | Admitting: Family

## 2021-04-01 VITALS — BP 148/94 | HR 89 | Resp 20 | Ht 77.0 in | Wt 307.0 lb

## 2021-04-01 DIAGNOSIS — Z79899 Other long term (current) drug therapy: Secondary | ICD-10-CM | POA: Insufficient documentation

## 2021-04-01 DIAGNOSIS — Z87891 Personal history of nicotine dependence: Secondary | ICD-10-CM | POA: Insufficient documentation

## 2021-04-01 DIAGNOSIS — Z794 Long term (current) use of insulin: Secondary | ICD-10-CM | POA: Insufficient documentation

## 2021-04-01 DIAGNOSIS — I110 Hypertensive heart disease with heart failure: Secondary | ICD-10-CM | POA: Insufficient documentation

## 2021-04-01 DIAGNOSIS — I5022 Chronic systolic (congestive) heart failure: Secondary | ICD-10-CM

## 2021-04-01 DIAGNOSIS — E1159 Type 2 diabetes mellitus with other circulatory complications: Secondary | ICD-10-CM | POA: Insufficient documentation

## 2021-04-01 DIAGNOSIS — I509 Heart failure, unspecified: Secondary | ICD-10-CM | POA: Insufficient documentation

## 2021-04-01 DIAGNOSIS — I10 Essential (primary) hypertension: Secondary | ICD-10-CM

## 2021-04-01 LAB — BASIC METABOLIC PANEL
Anion gap: 8 (ref 5–15)
BUN: 21 mg/dL — ABNORMAL HIGH (ref 6–20)
CO2: 22 mmol/L (ref 22–32)
Calcium: 9.2 mg/dL (ref 8.9–10.3)
Chloride: 109 mmol/L (ref 98–111)
Creatinine, Ser: 1.18 mg/dL (ref 0.61–1.24)
GFR, Estimated: >60 mL/min (ref >60)
Glucose, Bld: 127 mg/dL — ABNORMAL HIGH (ref 70–99)
Potassium: 3.8 mmol/L (ref 3.5–5.1)
Sodium: 139 mmol/L (ref 135–145)

## 2021-04-01 LAB — GLUCOSE, CAPILLARY: Glucose-Capillary: 147 mg/dL — ABNORMAL HIGH (ref 70–99)

## 2021-04-01 MED ORDER — FUROSEMIDE 40 MG PO TABS: 40.0000 mg | ORAL_TABLET | Freq: Two times a day (BID) | ORAL | 3 refills | Status: DC

## 2021-04-01 MED ORDER — METOPROLOL SUCCINATE ER 50 MG PO TB24: 50.0000 mg | ORAL_TABLET | Freq: Every day | ORAL | 3 refills | Status: DC

## 2021-04-01 NOTE — Progress Notes (Signed)
Gary Bentley - PHARMACIST COUNSELING NOTE  ADHERENCE ASSESSMENT   Do you ever forget to take your medication? [] Yes (1) [x] No (0)  Do you ever skip doses due to side effects? [] Yes (1) [x] No (0)  Do you have trouble affording your medicines? [x] Yes (1) [] No (0)  Are you ever unable to pick up your medication due to transportation difficulties? [] Yes (1) [x] No (0)  Do you ever stop taking your medications because you don't believe they are helping? [] Yes (1) [x] No (0)  Total score _1______    Recommendations given to patient about increasing adherence: Pt reports cost of medications is high. Pt unsure which medications are expensive, however, after review of medication list guessing Delene Loll is the costly medication as all others are generic. Patient assistance application completed in clinic.   Guideline-Directed Medical Therapy/Evidence Based Medicine  ACE/ARB/ARNI: [place] Corporal Blocker: metoprolol succinate  Aldosterone Antagonist: N/A Diuretic: furosemide    SUBJECTIVE  HPI:  Past Medical History:  Diagnosis Date  . CHF (congestive heart failure) ([place])   . Diabetes mellitus without complication ([place])   . Hypertension         OBJECTIVE   Vital signs: HR 89, BP 148/94, weight (pounds) 307 ECHO: Date 01/10/19, EF 15-20% Cath: Date 01/13/19, EF 15  BMP Latest Ref Rng & Units 03/20/2020 01/05/2020 11/13/2019  Glucose 70 - 99 mg/dL 383(H) 330(H) 207(H)  BUN 6 - 20 mg/dL 19 17 17   Creatinine 0.61 - 1.24 mg/dL 1.11 1.00 1.04  Sodium 135 - 145 mmol/L 134(L) 135 138  Potassium 3.5 - 5.1 mmol/L 4.1 4.3 3.9  Chloride 98 - 111 mmol/L 103 103 105  CO2 22 - 32 mmol/L 23 - 24  Calcium 8.9 - 10.3 mg/dL 8.5(L) - 8.9    ASSESSMENT 50 yo M presenting to heart failure clinic for follow up visit. Pt has been out of furosemide for a while. PMH includes HTN, diabetes, and CHF. Pt reports cost of medications is high. Pt unsure which  medications are expensive, however, after review of medication list guessing Delene Loll is the costly medication as all others are generic. No additional barriers to adherence were identified during medication reconciliation.    PLAN CHF/HTN - Continue furosemide 40 mg twice daily and KCl 20 mEq daily - refill sent  - Continue Entresto 49-51 mg twice daily   - Patient assistance form completed  - Continue metoprolol succinate 50 mg daily   Diabetes - Continue Novolog 70/30 30 units twice daily   Allergies - Continue cetirizine 10 mg daily   Time spent: 15 minutes  Benn Moulder, PharmD Pharmacy Resident  04/01/2021 11:36 AM    Current Outpatient Medications:  .  furosemide (LASIX) 40 MG tablet, Take 1 tablet (40 mg total) by mouth 2 (two) times daily., Disp: 180 tablet, Rfl: 3 .  insulin aspart protamine - aspart (NOVOLOG MIX 70/30 FLEXPEN) (70-30) 100 UNIT/ML FlexPen, Inject 0.1 mLs (10 Units total) into the skin 2 (two) times daily. (Patient taking differently: Inject 15 Units into the skin 2 (two) times daily.), Disp: 15 mL, Rfl: 11 .  isosorbide mononitrate (IMDUR) 30 MG 24 hr tablet, Take 1 tablet by mouth once daily, Disp: 90 tablet, Rfl: 3 .  metoprolol succinate (TOPROL-XL) 25 MG 24 hr tablet, Take 1 tablet (25 mg total) by mouth daily., Disp: 90 tablet, Rfl: 3 .  potassium chloride SA (KLOR-CON) 20 MEQ tablet, Take 1 tablet (20 mEq total) by mouth daily., Disp:  90 tablet, Rfl: 3 .  sacubitril-valsartan (ENTRESTO) 49-51 MG, Take 1 tablet by mouth 2 (two) times daily., Disp: 60 tablet, Rfl: 5   COUNSELING POINTS/CLINICAL PEARLS  Metoprolol Succinate (Goal: 200 mg once daily) Warn patient to avoid activities requiring mental alertness or coordination until drug effects are realized, as drug may cause dizziness. Tell patient planning major surgery with anesthesia to alert physician that drug is being used, as drug impairs ability of heart to respond to reflex adrenergic  stimuli. Drug may cause diarrhea, fatigue, headache, or depression. Advise diabetic patient to carefully monitor blood glucose as drug may mask symptoms of hypoglycemia. Patient should take extended-release tablet with or immediately following meals. Counsel patient against sudden discontinuation of drug, as this may precipitate hypertension, angina, or myocardial infarction. In the event of a missed dose, counsel patient to skip the missed dose and maintain a regular dosing schedule. Entresto (Goal: 97/103 mg twice daily)  Warn male patient to avoid pregnancy during therapy and to report a pregnancy to a physician.  Advise patient to report symptomatic hypotension.  Side effects may include hyperkalemia, cough, dizziness, or renal failure. Furosemide  Drug causes sun-sensitivity. Advise patient to use sunscreen and avoid tanning beds. Patient should avoid activities requiring coordination until drug effects are realized, as drug may cause dizziness, vertigo, or blurred vision. This drug may cause hyperglycemia, hyperuricemia, constipation, diarrhea, loss of appetite, nausea, vomiting, purpuric disorder, cramps, spasticity, asthenia, headache, paresthesia, or scaling eczema. Instruct patient to report unusual bleeding/bruising or signs/symptoms of hypotension, infection, pancreatitis, or ototoxicity (tinnitus, hearing impairment). Advise patient to report signs/symptoms of a severe skin reactions (flu-like symptoms, spreading red rash, or skin/mucous membrane blistering) or erythema multiforme. Instruct patient to eat high-potassium foods during drug therapy, as directed by healthcare professional.  Patient should not drink alcohol while taking this drug.  DRUGS TO AVOID IN HEART FAILURE  Drug or Class Mechanism  Analgesics . NSAIDs . COX-2 inhibitors . Glucocorticoids  Sodium and water retention, increased systemic vascular resistance, decreased response to diuretics   Diabetes  Medications . Metformin . Thiazolidinediones o Rosiglitazone ([place]) o Pioglitazone (Actos) . DPP4 Inhibitors o Saxagliptin (Onglyza) o Sitagliptin (Januvia)   Lactic acidosis Possible calcium channel blockade   Unknown  Antiarrhythmics . Class I  o Flecainide o Disopyramide . Class III o Sotalol . Other o Dronedarone  Negative inotrope, proarrhythmic   Proarrhythmic, beta blockade  Negative inotrope  Antihypertensives . Alpha Blockers o Doxazosin . Calcium Channel Blockers o Diltiazem o Verapamil o Nifedipine . Central Alpha Adrenergics o Moxonidine . Peripheral Vasodilators o Minoxidil  Increases renin and aldosterone  Negative inotrope    Possible sympathetic withdrawal  Unknown  Anti-infective . Itraconazole . Amphotericin B  Negative inotrope Unknown  Hematologic . Anagrelide . Cilostazol   Possible inhibition of PD IV Inhibition of PD III causing arrhythmias  Neurologic/Psychiatric . Stimulants . Anti-Seizure Drugs o Carbamazepine o Pregabalin . Antidepressants o Tricyclics o Citalopram . Parkinsons o Bromocriptine o Pergolide o Pramipexole . Antipsychotics o Clozapine . Antimigraine o Ergotamine o Methysergide . Appetite suppressants . Bipolar o Lithium  Peripheral alpha and beta agonist activity  Negative inotrope and chronotrope Calcium channel blockade  Negative inotrope, proarrhythmic Dose-dependent QT prolongation  Excessive serotonin activity/valvular damage Excessive serotonin activity/valvular damage Unknown  IgE mediated hypersensitivy, calcium channel blockade  Excessive serotonin activity/valvular damage Excessive serotonin activity/valvular damage Valvular damage  Direct myofibrillar degeneration, adrenergic stimulation  Antimalarials . Chloroquine . Hydroxychloroquine Intracellular inhibition of lysosomal enzymes  Urologic  Agents . Alpha  Blockers o Doxazosin o Prazosin o Tamsulosin o Terazosin  Increased renin and aldosterone  Adapted from Page RL, et al. "Drugs That May Cause or Exacerbate Heart Failure: A Scientific Statement from the [employer]." Circulation 2016; 939:Q30-S92. DOI: 10.1161/CIR.0000000000000426   MEDICATION ADHERENCES TIPS AND STRATEGIES 1. Taking medication as prescribed improves patient outcomes in heart failure (reduces hospitalizations, improves symptoms, increases survival) 2. Side effects of medications can be managed by decreasing doses, switching agents, stopping drugs, or adding additional therapy. Please let someone in the [facility] know if you have having bothersome side effects so we can modify your regimen. Do not alter your medication regimen without talking to [place].  3. Medication reminders can help patients remember to take drugs on time. If you are missing or forgetting doses you can try linking behaviors, using pill boxes, or an electronic reminder like an alarm on your phone or an app. Some people can also get automated phone calls as medication reminders.

## 2021-04-01 NOTE — Progress Notes (Signed)
Patient ID: Gary Bentley, male    DOB: December 18, 1970, 50 y.o.   MRN: [id number]   Gary Bentley is a 50 y/o male with a history of DM, HTN, previous tobacco use and chronic heart failure.   Echo report from 06/03/20 reviewed and showed an EF of 30% along with mild Gary. Echo report from 01/10/2019 reviewed and showed an EF of 15-20% along with trivial AR and mild pulmonic stenosis.   Catheterization done 01/13/2019 showed: Nonischemic cardiomyopathy, no significant CAD Mildly elevated wedge pressure,  EF severely depressed, global Hypokinesis, EF 20%  Has not been admitted or been in the ED in the last 6 months.   He presents today with a chief complaint of a follow-up visit. He currently doesn't have any complaints and specifically denies any difficulty sleeping, dizziness, abdominal distention, palpitations, pedal edema, chest pain, shortness of breath, cough or fatigue.   Has tolerated entresto titration without known side effects.   NT to put Dr. Etta Quill contact information on his AVS as he says that he needs to make an appointment and doesn't have time to see if we can get through today.   Past Medical History:  Diagnosis Date  . CHF (congestive heart failure) ([place])   . Diabetes mellitus without complication ([place])   . Hypertension    Past Surgical History:  Procedure Laterality Date  . AMPUTATION TOE Left 01/05/2020   Procedure: AMPUTATION TOE MPJ LEFT;  Surgeon: Gary Bentley, DPM;  Location: [facility];  Service: Podiatry;  Laterality: Left;  . FOOT SURGERY    . RIGHT/LEFT HEART CATH AND CORONARY ANGIOGRAPHY N/A 01/13/2019   Procedure: RIGHT/LEFT HEART CATH AND CORONARY ANGIOGRAPHY;  Surgeon: Gary Merritts, MD;  Location: [facility];  Service: Cardiovascular;  Laterality: N/A;   Family History  Family history unknown: Yes   Social History   Tobacco Use  . Smoking status: Former Smoker    Packs/day: 1.00    Types: Cigarettes    Quit date: 04/17/2017    Years  since quitting: 3.9  . Smokeless tobacco: Never Used  Substance Use Topics  . Alcohol use: No   Allergies  Allergen Reactions  . Bee Venom Anaphylaxis    Prior to Admission medications   Medication Sig Start Date End Date Taking? Authorizing Provider  cetirizine (ZYRTEC) 10 MG tablet Take 10 mg by mouth daily.   Yes [provider]  furosemide (LASIX) 40 MG tablet Take 1 tablet (40 mg total) by mouth 2 (two) times daily. 12/12/20 12/12/21 Yes Gary Bentley, Otila Kluver A, FNP  insulin aspart protamine - aspart (NOVOLOG MIX 70/30 FLEXPEN) (70-30) 100 UNIT/ML FlexPen Inject 0.1 mLs (10 Units total) into the skin 2 (two) times daily. Patient taking differently: Inject 30 Units into the skin 2 (two) times daily. 01/15/19  Yes Gary Flock, MD  metoprolol succinate (TOPROL-XL) 25 MG 24 hr tablet Take 1 tablet (25 mg total) by mouth daily. 09/19/20  Yes Gary Bentley, Otila Kluver A, FNP  potassium chloride SA (KLOR-CON) 20 MEQ tablet Take 1 tablet (20 mEq total) by mouth daily. 07/17/20  Yes Gary Bentley A, FNP  sacubitril-valsartan (ENTRESTO) 49-51 MG Take 1 tablet by mouth 2 (two) times daily. 01/14/21  Yes Gary Bentley A, FNP  isosorbide mononitrate (IMDUR) 30 MG 24 hr tablet Take 1 tablet by mouth once daily Patient not taking: Reported on 04/01/2021 03/13/21   Gary Graff, FNP   Review of Systems  Constitutional: Negative for appetite change and fatigue.  HENT: Negative for  congestion, postnasal drip and sore throat.   Eyes: Negative.   Respiratory: Negative for cough and shortness of breath.   Cardiovascular: Negative for chest pain, palpitations and leg swelling.  Gastrointestinal: Negative for abdominal distention and abdominal pain.  Endocrine: Negative.   Genitourinary: Negative.   Musculoskeletal: Negative for arthralgias, back pain and neck pain.  Skin: Negative.   Allergic/Immunologic: Negative.   Neurological: Negative for dizziness and light-headedness.  Hematological: Negative for  adenopathy. Does not bruise/bleed easily.  Psychiatric/Behavioral: Negative for dysphoric mood and sleep disturbance (sleeping on 2 pillows). The patient is not nervous/anxious.    Vitals:   04/01/21 1035  BP: (!) 148/94  Pulse: 89  Resp: 20  SpO2: 100%  Weight: (!) 307 lb (139.3 kg)  Height: 6\' 5"  (1.956 m)   Wt Readings from Last 3 Encounters:  04/01/21 (!) 307 lb (139.3 kg)  01/14/21 (!) 304 lb (137.9 kg)  12/12/20 291 lb (132 kg)   Lab Results  Component Value Date   CREATININE 1.18 04/01/2021   CREATININE 1.11 03/20/2020   CREATININE 1.00 01/05/2020    Physical Exam Vitals and nursing note reviewed.  Constitutional:      Appearance: Normal appearance.  HENT:     Head: Normocephalic and atraumatic.  Cardiovascular:     Rate and Rhythm: Normal rate and regular rhythm.  Pulmonary:     Effort: Pulmonary effort is normal.     Breath sounds: No wheezing, rhonchi or rales.  Abdominal:     General: There is no distension.     Palpations: Abdomen is soft.     Tenderness: There is no abdominal tenderness.  Musculoskeletal:        General: No tenderness.     Cervical back: Normal range of motion and neck supple.     Right lower leg: No edema.  Skin:    General: Skin is warm and dry.  Neurological:     General: No focal deficit present.     Mental Status: He is alert and oriented to person, place, and time.  Psychiatric:        Mood and Affect: Mood normal.        Behavior: Behavior normal.        Thought Content: Thought content normal.     Assessment & Plan:  1: Chronic heart failure with reduced ejection fraction- - NYHA class I - euvolemic today - weighing daily; reminded to call for an overnight weight gain of >2 pounds or a weekly weight gain of >5 pounds - weight up 3 pounds from last visit here 2.5 month ago - admits that he needs to become more active - not adding salt and is trying to limit his sodium intake - saw cardiology Clayborn Bigness) 09/27/20; is  going to call and schedule f/u appointment - participating in paramedicine program  - on GDMT of entresto & metoprolol - will check BMP today as entresto increased at last visit - will increase metoprolol to 50mg  daily; he has a couple tablets left of the 25mg  dose that he will finish and then begin the 50mg  dose - discussed spironolactone or SGLT2 at next visit - BNP 07/10/2019 was 968.0 - PharmD reconciled medications with the patient  2: HTN- - BP mildly elevated - saw PCP [name]) 02/28/21 - BMP 09/27/20 reviewed and showed sodium 138, potassium 4.5, creatinine 1.3 and GFR 72  3: DM- - A1c 09/11/20 was 9.4% - nonfasting glucose in clinic today was 147   Medication bottles reviewed.  Return in 1 month or sooner for any questions/problems before then.

## 2021-04-01 NOTE — Patient Instructions (Addendum)
Continue weighing daily and call for an overnight weight gain of > 2 pounds or a weekly weight gain of >5 pounds.   Once you finish the metoprolol 25mg  dose, begin the new dose of 50mg  daily   Dr. Clayborn Bigness [address], [place], [place] [address] [phone]

## 2021-04-17 ENCOUNTER — Telehealth (HOSPITAL_COMMUNITY): Payer: Self-pay

## 2021-04-17 NOTE — Telephone Encounter (Signed)
Today contacted Gary Bentley to see how he has been doing.  He states working all the time.  It is hard to visit with him due to his schedule.  He is aware of changing dose of metoprolol, he states started 50 mg metoprolol.  He states he has all his medications and verified them with him.  He denies needing anything.  He is aware to contact me with problems, concerns or needs.  He is aware of up coming appts.  Will continue to visit for heart failure.   [place] [phone]

## 2021-05-04 NOTE — Progress Notes (Signed)
Patient ID: Gary Bentley, male    DOB: 05/17/1971, 50 y.o.   MRN: [id number]   Gary Bentley is a 50 y/o male with a history of DM, HTN, previous tobacco use and chronic heart failure.   Echo report from 06/03/20 reviewed and showed an EF of 30% along with mild Gary. Echo report from 01/10/2019 reviewed and showed an EF of 15-20% along with trivial AR and mild pulmonic stenosis.   Catheterization done 01/13/2019 showed: Nonischemic cardiomyopathy, no significant CAD Mildly elevated wedge pressure,  EF severely depressed, global Hypokinesis, EF 20%  Has not been admitted or been in the ED in the last 6 months.   He presents today for a follow-up visit with a chief complaint of anxiety. He says that this has been present for several months. He has associated gradual weight gain along with this. He denies any difficulty sleeping, dizziness, cough, shortness of breath, chest pain, pedal edema, palpitations, abdominal distention or fatigue.   Says that he's been out of entresto for 2 days as he thought it was going to be mailed to his house. Says that he's going to pick up refill today.   Past Medical History:  Diagnosis Date   CHF (congestive heart failure) ([place])    Diabetes mellitus without complication ([place])    Hypertension    Past Surgical History:  Procedure Laterality Date   AMPUTATION TOE Left 01/05/2020   Procedure: AMPUTATION TOE MPJ LEFT;  Surgeon: Samara Deist, DPM;  Location: [facility];  Service: Podiatry;  Laterality: Left;   FOOT SURGERY     RIGHT/LEFT HEART CATH AND CORONARY ANGIOGRAPHY N/A 01/13/2019   Procedure: RIGHT/LEFT HEART CATH AND CORONARY ANGIOGRAPHY;  Surgeon: Minna Merritts, MD;  Location: [facility];  Service: Cardiovascular;  Laterality: N/A;   Family History  Family history unknown: Yes   Social History   Tobacco Use   Smoking status: Former    Packs/day: 1.00    Pack years: 0.00    Types: Cigarettes    Quit date: 04/17/2017    Years since  quitting: 4.0   Smokeless tobacco: Never  Substance Use Topics   Alcohol use: No   Allergies  Allergen Reactions   Bee Venom Anaphylaxis   Prior to Admission medications   Medication Sig Start Date End Date Taking? Authorizing Provider  cetirizine (ZYRTEC) 10 MG tablet Take 10 mg by mouth daily.   Yes [provider]  furosemide (LASIX) 40 MG tablet Take 1 tablet (40 mg total) by mouth 2 (two) times daily. 04/01/21 04/01/22 Yes Benjamyn Hestand, Otila Kluver A, FNP  insulin aspart protamine - aspart (NOVOLOG MIX 70/30 FLEXPEN) (70-30) 100 UNIT/ML FlexPen Inject 0.1 mLs (10 Units total) into the skin 2 (two) times daily. Patient taking differently: Inject 30 Units into the skin 2 (two) times daily. 01/15/19  Yes Dustin Flock, MD  isosorbide mononitrate (IMDUR) 30 MG 24 hr tablet Take 1 tablet by mouth once daily 03/13/21  Yes Darylene Price A, FNP  metoprolol succinate (TOPROL-XL) 50 MG 24 hr tablet Take 1 tablet (50 mg total) by mouth daily. Take with or immediately following a meal. 04/01/21 06/30/21 Yes Dorothia Passmore, Otila Kluver A, FNP  potassium chloride SA (KLOR-CON) 20 MEQ tablet Take 1 tablet (20 mEq total) by mouth daily. 07/17/20  Yes Fajr Fife A, FNP  sacubitril-valsartan (ENTRESTO) 49-51 MG Take 1 tablet by mouth 2 (two) times daily. 01/14/21  Yes Alisa Graff, FNP   Review of Systems  Constitutional:  Negative for  appetite change and fatigue.  HENT:  Negative for congestion, postnasal drip and sore throat.   Eyes: Negative.   Respiratory:  Negative for cough and shortness of breath.   Cardiovascular:  Negative for chest pain, palpitations and leg swelling.  Gastrointestinal:  Negative for abdominal distention and abdominal pain.  Endocrine: Negative.   Genitourinary: Negative.   Musculoskeletal:  Negative for arthralgias, back pain and neck pain.  Skin: Negative.   Allergic/Immunologic: Negative.   Neurological:  Negative for dizziness and light-headedness.  Hematological:  Negative for  adenopathy. Does not bruise/bleed easily.  Psychiatric/Behavioral:  Negative for dysphoric mood and sleep disturbance (sleeping on 2 pillows). The patient is nervous/anxious.    Vitals:   05/05/21 1412  BP: (!) 149/77  Pulse: 78  Resp: 18  SpO2: 100%  Weight: (!) 311 lb 6 oz (141.2 kg)  Height: 6\' 5"  (1.956 m)   Wt Readings from Last 3 Encounters:  05/05/21 (!) 311 lb 6 oz (141.2 kg)  04/01/21 (!) 307 lb (139.3 kg)  01/14/21 (!) 304 lb (137.9 kg)   Lab Results  Component Value Date   CREATININE 1.18 04/01/2021   CREATININE 1.11 03/20/2020   CREATININE 1.00 01/05/2020    Physical Exam Vitals and nursing note reviewed.  Constitutional:      Appearance: Normal appearance.  HENT:     Head: Normocephalic and atraumatic.  Cardiovascular:     Rate and Rhythm: Normal rate and regular rhythm.  Pulmonary:     Effort: Pulmonary effort is normal.     Breath sounds: No wheezing, rhonchi or rales.  Abdominal:     General: There is no distension.     Palpations: Abdomen is soft.     Tenderness: There is no abdominal tenderness.  Musculoskeletal:        General: No tenderness.     Cervical back: Normal range of motion and neck supple.     Right lower leg: No edema.  Skin:    General: Skin is warm and dry.  Neurological:     General: No focal deficit present.     Mental Status: He is alert and oriented to person, place, and time.  Psychiatric:        Mood and Affect: Mood normal.        Behavior: Behavior normal.        Thought Content: Thought content normal.    Assessment & Plan:  1: Chronic heart failure with reduced ejection fraction- - NYHA class I - euvolemic today - weighing daily; reminded to call for an overnight weight gain of >2 pounds or a weekly weight gain of >5 pounds - weight up 4 pounds from last visit here 1 month ago - admits that he needs to become more active - not adding salt and is trying to limit his sodium intake - saw cardiology Clayborn Bigness)  09/27/20; returns 05/09/21 - participating in paramedicine program  - on GDMT of entresto & metoprolol although he's been out of entresto for the last 2 days - discussed spironolactone or SGLT2 at next visit - BNP 07/10/2019 was 968.0 - PharmD reconciled medications with the patient  2: HTN- - BP mildly elevated but he's been without entresto; picking it up today after work - saw PCP [name]) 02/28/21 - BMP 04/01/21 reviewed and showed sodium 139, potassium 3.8, creatinine 1.18 and GFR >60  3: DM- - A1c 09/11/20 was 9.4%   Patient did not bring his medications nor a list. Each medication was verbally reviewed  with the patient and he was encouraged to bring the bottles to every visit to confirm accuracy of list.   Emphasized the importance of not running out of his medications and to call in for refills before he runs out.  Return in 6 weeks or sooner for any questions/problems before then.

## 2021-05-05 ENCOUNTER — Encounter: Payer: Self-pay | Admitting: Pharmacist

## 2021-05-05 ENCOUNTER — Encounter: Payer: Self-pay | Admitting: Family

## 2021-05-05 ENCOUNTER — Ambulatory Visit: Payer: BC Managed Care – PPO | Attending: Family | Admitting: Family

## 2021-05-05 ENCOUNTER — Other Ambulatory Visit: Payer: Self-pay

## 2021-05-05 VITALS — BP 149/77 | HR 78 | Resp 18 | Ht 77.0 in | Wt 311.4 lb

## 2021-05-05 DIAGNOSIS — Z79899 Other long term (current) drug therapy: Secondary | ICD-10-CM | POA: Diagnosis not present

## 2021-05-05 DIAGNOSIS — I110 Hypertensive heart disease with heart failure: Secondary | ICD-10-CM | POA: Diagnosis not present

## 2021-05-05 DIAGNOSIS — Z87891 Personal history of nicotine dependence: Secondary | ICD-10-CM | POA: Insufficient documentation

## 2021-05-05 DIAGNOSIS — E1159 Type 2 diabetes mellitus with other circulatory complications: Secondary | ICD-10-CM

## 2021-05-05 DIAGNOSIS — Z794 Long term (current) use of insulin: Secondary | ICD-10-CM

## 2021-05-05 DIAGNOSIS — I5022 Chronic systolic (congestive) heart failure: Secondary | ICD-10-CM | POA: Diagnosis not present

## 2021-05-05 DIAGNOSIS — I10 Essential (primary) hypertension: Secondary | ICD-10-CM

## 2021-05-05 DIAGNOSIS — E119 Type 2 diabetes mellitus without complications: Secondary | ICD-10-CM | POA: Insufficient documentation

## 2021-05-05 NOTE — Progress Notes (Signed)
[facility] - PHARMACIST COUNSELING NOTE  Guideline-Directed Medical Therapy/Evidence Based Medicine  ACE/ARB/ARNI: Sacubitril-valsartan 49-51 mg twice daily Beta Blocker: Metoprolol succinate 50 mg daily Aldosterone Antagonist:  N/A Diuretic: Furosemide 40 mg twice daily SGLT2i:  N/A  Adherence Assessment  Do you ever forget to take your medication? [] Yes [x] No  Do you ever skip doses due to side effects? [] Yes [x] No  Do you have trouble affording your medicines? [] Yes [x] No  Are you ever unable to pick up your medication due to transportation difficulties? [] Yes [x] No  Do you ever stop taking your medications because you don't believe they are helping? [] Yes [x] No  Do you check your weight daily? [x] Yes [] No   Adherence strategy: Pt endorses adherence by bringing his medications to work every day   Barriers to obtaining medications: N/A  Vital signs: HR 78, BP 149/77, weight (pounds) 311.6 ECHO: Date 01/10/2019, EF 15-20%, notes RV moderately enlarged, LV mildly dilated  BMP Latest Ref Rng & Units 04/01/2021 03/20/2020 01/05/2020  Glucose 70 - 99 mg/dL 127(H) 383(H) 330(H)  BUN 6 - 20 mg/dL 21(H) 19 17  Creatinine 0.61 - 1.24 mg/dL 1.18 1.11 1.00  Sodium 135 - 145 mmol/L 139 134(L) 135  Potassium 3.5 - 5.1 mmol/L 3.8 4.1 4.3  Chloride 98 - 111 mmol/L 109 103 103  CO2 22 - 32 mmol/L 22 23 -  Calcium 8.9 - 10.3 mg/dL 9.2 8.5(L) -    Past Medical History:  Diagnosis Date   CHF (congestive heart failure) ([facility])    Diabetes mellitus without complication ([place])    Hypertension     ASSESSMENT 50 year old male who presents to the HF clinic for a follow up visit. Pt has had compliance issues in the past, however he states he has been taking his medications. He did run out of his Delene Loll and has not taken it in about 2 days; he stated he thought it was going to come in through the mail. Paramedic program called in a refill for him and  pt is going to pick it up later today. Pt did not bring his medications with him today but was able to recall the medications after I stated the names. He did proceed to ask for me to tell him what each medication was for. Educated pt on all of his medication indications and pt verbalized understanding. Pt denies episodes or symptoms of hypotension. Pt does not have any complaints at this time.   Recent ED Visit (past 6 months): Date - N/A  PLAN CHF/HTN -continue furosemide 40 mg twice daily, Entresto 49-51 mg twice daily, metoprolol succinate 50 mg daily -consider addition of MRA or SGLT2i at a later visit if BP allows; would need to ensure pt compliance   Diabetes -09/11/2020 A1c 9.4% -continue Novolog 70/30 30 units twice daily -follow up with PCP for repeat A1c    Time spent: 10 minutes  Sherilyn Banker, PharmD Pharmacy Resident  05/05/2021 2:27 PM     Current Outpatient Medications:    cetirizine (ZYRTEC) 10 MG tablet, Take 10 mg by mouth daily., Disp: , Rfl:    furosemide (LASIX) 40 MG tablet, Take 1 tablet (40 mg total) by mouth 2 (two) times daily., Disp: 180 tablet, Rfl: 3   insulin aspart protamine - aspart (NOVOLOG MIX 70/30 FLEXPEN) (70-30) 100 UNIT/ML FlexPen, Inject 0.1 mLs (10 Units total) into the skin 2 (two) times daily. (Patient taking differently: Inject 30 Units into the skin 2 (  two) times daily.), Disp: 15 mL, Rfl: 11   isosorbide mononitrate (IMDUR) 30 MG 24 hr tablet, Take 1 tablet by mouth once daily, Disp: 90 tablet, Rfl: 3   metoprolol succinate (TOPROL-XL) 50 MG 24 hr tablet, Take 1 tablet (50 mg total) by mouth daily. Take with or immediately following a meal., Disp: 90 tablet, Rfl: 3   potassium chloride SA (KLOR-CON) 20 MEQ tablet, Take 1 tablet (20 mEq total) by mouth daily., Disp: 90 tablet, Rfl: 3   sacubitril-valsartan (ENTRESTO) 49-51 MG, Take 1 tablet by mouth 2 (two) times daily., Disp: 60 tablet, Rfl: 5   DRUGS TO CAUTION IN HEART FAILURE   Drug or Class Mechanism  Analgesics NSAIDs COX-2 inhibitors Glucocorticoids  Sodium and water retention, increased systemic vascular resistance, decreased response to diuretics   Diabetes Medications Metformin Thiazolidinediones Rosiglitazone (Avandia) Pioglitazone (Actos) DPP4 Inhibitors Saxagliptin (Onglyza) Sitagliptin (Januvia)   Lactic acidosis Possible calcium channel blockade   Unknown  Antiarrhythmics Class I  Flecainide Disopyramide Class III Sotalol Other Dronedarone  Negative inotrope, proarrhythmic   Proarrhythmic, beta blockade  Negative inotrope  Antihypertensives Alpha Blockers Doxazosin Calcium Channel Blockers Diltiazem Verapamil Nifedipine Central Alpha Adrenergics Moxonidine Peripheral Vasodilators Minoxidil  Increases renin and aldosterone  Negative inotrope    Possible sympathetic withdrawal  Unknown  Anti-infective Itraconazole Amphotericin B  Negative inotrope Unknown  Hematologic Anagrelide Cilostazol   Possible inhibition of PD IV Inhibition of PD III causing arrhythmias  Neurologic/Psychiatric Stimulants Anti-Seizure Drugs Carbamazepine Pregabalin Antidepressants Tricyclics Citalopram Parkinsons Bromocriptine Pergolide Pramipexole Antipsychotics Clozapine Antimigraine Ergotamine Methysergide Appetite suppressants Bipolar Lithium  Peripheral alpha and beta agonist activity  Negative inotrope and chronotrope Calcium channel blockade  Negative inotrope, proarrhythmic Dose-dependent QT prolongation  Excessive serotonin activity/valvular damage Excessive serotonin activity/valvular damage Unknown  IgE mediated hypersensitivy, calcium channel blockade  Excessive serotonin activity/valvular damage Excessive serotonin activity/valvular damage Valvular damage  Direct myofibrillar degeneration, adrenergic stimulation  Antimalarials Chloroquine Hydroxychloroquine Intracellular inhibition of  lysosomal enzymes  Urologic Agents Alpha Blockers Doxazosin Prazosin Tamsulosin Terazosin  Increased renin and aldosterone  Adapted from Page Carleene Overlie, et al. "Drugs That May Cause or Exacerbate Heart Failure: A Scientific Statement from the American Heart  Association." Circulation 2016; 134:e32-e69. DOI: 10.1161/CIR.0000000000000426   MEDICATION ADHERENCES TIPS AND STRATEGIES Taking medication as prescribed improves patient outcomes in heart failure (reduces hospitalizations, improves symptoms, increases survival) Side effects of medications can be managed by decreasing doses, switching agents, stopping drugs, or adding additional therapy. Please let someone in the [facility] know if you have having bothersome side effects so we can modify your regimen. Do not alter your medication regimen without talking to [place].  Medication reminders can help patients remember to take drugs on time. If you are missing or forgetting doses you can try linking behaviors, using pill boxes, or an electronic reminder like an alarm on your phone or an app. Some people can also get automated phone calls as medication reminders.

## 2021-05-05 NOTE — Patient Instructions (Signed)
Continue weighing daily and call for an overnight weight gain of > 2 pounds or a weekly weight gain of >5 pounds. 

## 2021-05-09 DIAGNOSIS — E119 Type 2 diabetes mellitus without complications: Secondary | ICD-10-CM | POA: Diagnosis not present

## 2021-05-09 DIAGNOSIS — R600 Localized edema: Secondary | ICD-10-CM | POA: Diagnosis not present

## 2021-05-09 DIAGNOSIS — I5020 Unspecified systolic (congestive) heart failure: Secondary | ICD-10-CM | POA: Diagnosis not present

## 2021-05-09 DIAGNOSIS — I10 Essential (primary) hypertension: Secondary | ICD-10-CM | POA: Diagnosis not present

## 2021-05-15 DIAGNOSIS — B351 Tinea unguium: Secondary | ICD-10-CM | POA: Diagnosis not present

## 2021-05-15 DIAGNOSIS — Z89412 Acquired absence of left great toe: Secondary | ICD-10-CM | POA: Diagnosis not present

## 2021-05-15 DIAGNOSIS — E1142 Type 2 diabetes mellitus with diabetic polyneuropathy: Secondary | ICD-10-CM | POA: Diagnosis not present

## 2021-05-15 DIAGNOSIS — D2372 Other benign neoplasm of skin of left lower limb, including hip: Secondary | ICD-10-CM | POA: Diagnosis not present

## 2021-05-29 DIAGNOSIS — Z794 Long term (current) use of insulin: Secondary | ICD-10-CM | POA: Diagnosis not present

## 2021-05-29 DIAGNOSIS — I509 Heart failure, unspecified: Secondary | ICD-10-CM | POA: Diagnosis not present

## 2021-05-29 DIAGNOSIS — R600 Localized edema: Secondary | ICD-10-CM | POA: Diagnosis not present

## 2021-05-29 DIAGNOSIS — E119 Type 2 diabetes mellitus without complications: Secondary | ICD-10-CM | POA: Diagnosis not present

## 2021-05-29 DIAGNOSIS — F411 Generalized anxiety disorder: Secondary | ICD-10-CM | POA: Insufficient documentation

## 2021-05-29 DIAGNOSIS — I10 Essential (primary) hypertension: Secondary | ICD-10-CM | POA: Diagnosis not present

## 2021-06-04 DIAGNOSIS — I5020 Unspecified systolic (congestive) heart failure: Secondary | ICD-10-CM | POA: Diagnosis not present

## 2021-06-16 NOTE — Progress Notes (Deleted)
Patient ID: Gary Bentley, male    DOB: [birth year], 50 y.o.   MRN: [id number]   Gary Bentley is a 50 y/o male with a history of DM, HTN, previous tobacco use and chronic heart failure.   Echo report from 06/04/21 reviewed and showed an EF of 30%. Echo report from 06/03/20 reviewed and showed an EF of 30% along with mild Gary. Echo report from 01/10/2019 reviewed and showed an EF of 15-20% along with trivial AR and mild pulmonic stenosis.   Catheterization done 01/13/2019 showed: Nonischemic cardiomyopathy, no significant CAD Mildly elevated wedge pressure,  EF severely depressed, global Hypokinesis, EF 20%  Has not been admitted or been in the ED in the last 6 months.   He presents today for a follow-up visit with a chief complaint of   Past Medical History:  Diagnosis Date   CHF (congestive heart failure) ([place])    Diabetes mellitus without complication ([place])    Hypertension    Past Surgical History:  Procedure Laterality Date   AMPUTATION TOE Left 01/05/2020   Procedure: AMPUTATION TOE MPJ LEFT;  Surgeon: Gary Bentley, DPM;  Location: [facility];  Service: Podiatry;  Laterality: Left;   FOOT SURGERY     RIGHT/LEFT HEART CATH AND CORONARY ANGIOGRAPHY N/A 01/13/2019   Procedure: RIGHT/LEFT HEART CATH AND CORONARY ANGIOGRAPHY;  Surgeon: Gary Merritts, MD;  Location: [place] [facility];  Service: Cardiovascular;  Laterality: N/A;   Family History  Family history unknown: Yes   Social History   Tobacco Use   Smoking status: Former    Packs/day: 1.00    Types: Cigarettes    Quit date: 04/17/2017    Years since quitting: 4.1   Smokeless tobacco: Never  Substance Use Topics   Alcohol use: No   Allergies  Allergen Reactions   Bee Venom Anaphylaxis    Review of Systems  Constitutional:  Negative for appetite change and fatigue.  HENT:  Negative for congestion, postnasal drip and sore throat.   Eyes: Negative.   Respiratory:  Negative for cough and shortness of breath.    Cardiovascular:  Negative for chest pain, palpitations and leg swelling.  Gastrointestinal:  Negative for abdominal distention and abdominal pain.  Endocrine: Negative.   Genitourinary: Negative.   Musculoskeletal:  Negative for arthralgias, back pain and neck pain.  Skin: Negative.   Allergic/Immunologic: Negative.   Neurological:  Negative for dizziness and light-headedness.  Hematological:  Negative for adenopathy. Does not bruise/bleed easily.  Psychiatric/Behavioral:  Negative for dysphoric mood and sleep disturbance (sleeping on 2 pillows). The patient is nervous/anxious.       Physical Exam Vitals and nursing note reviewed.  Constitutional:      Appearance: Normal appearance.  HENT:     Head: Normocephalic and atraumatic.  Cardiovascular:     Rate and Rhythm: Normal rate and regular rhythm.  Pulmonary:     Effort: Pulmonary effort is normal.     Breath sounds: No wheezing, rhonchi or rales.  Abdominal:     General: There is no distension.     Palpations: Abdomen is soft.     Tenderness: There is no abdominal tenderness.  Musculoskeletal:        General: No tenderness.     Cervical back: Normal range of motion and neck supple.     Right lower leg: No edema.  Skin:    General: Skin is warm and dry.  Neurological:     General: No focal deficit present.  Mental Status: He is alert and oriented to person, place, and time.  Psychiatric:        Mood and Affect: Mood normal.        Behavior: Behavior normal.        Thought Content: Thought content normal.    Assessment & Plan:  1: Chronic heart failure with reduced ejection fraction- - NYHA class I - euvolemic today - weighing daily; reminded to call for an overnight weight gain of >2 pounds or a weekly weight gain of >5 pounds - weight 311.6 pounds from last visit here 6 weeks ago - admits that he needs to become more active - not adding salt and is trying to limit his sodium intake - saw cardiology Gary Bentley)  05/09/21 - participating in paramedicine program  - on GDMT of entresto & metoprolol although he's been out of entresto for the last 2 days - discussed spironolactone or SGLT2 at next visit - BNP 07/10/2019 was 968.0   2: HTN- - BP  - saw PCP Gary Bentley) 05/29/21 - BMP 05/29/21 reviewed and showed sodium 140, potassium 3.8, creatinine 1.4 and GFR 66  3: DM- - A1c 05/29/21 was 7.4%   Patient did not bring his medications nor a list. Each medication was verbally reviewed with the patient and he was encouraged to bring the bottles to every visit to confirm accuracy of list.

## 2021-06-17 ENCOUNTER — Ambulatory Visit: Payer: BC Managed Care – PPO | Admitting: Family

## 2021-06-17 ENCOUNTER — Telehealth: Payer: Self-pay | Admitting: Family

## 2021-06-17 DIAGNOSIS — I10 Essential (primary) hypertension: Secondary | ICD-10-CM | POA: Diagnosis not present

## 2021-06-17 DIAGNOSIS — I509 Heart failure, unspecified: Secondary | ICD-10-CM | POA: Diagnosis not present

## 2021-06-17 DIAGNOSIS — J309 Allergic rhinitis, unspecified: Secondary | ICD-10-CM | POA: Diagnosis not present

## 2021-06-17 NOTE — Telephone Encounter (Signed)
Patient did not show for his Heart Failure Clinic appointment on 06/17/21. Will attempt to reschedule.

## 2021-07-09 DIAGNOSIS — R600 Localized edema: Secondary | ICD-10-CM | POA: Diagnosis not present

## 2021-07-09 DIAGNOSIS — I10 Essential (primary) hypertension: Secondary | ICD-10-CM | POA: Diagnosis not present

## 2021-07-09 DIAGNOSIS — I5023 Acute on chronic systolic (congestive) heart failure: Secondary | ICD-10-CM | POA: Diagnosis not present

## 2021-07-09 DIAGNOSIS — E119 Type 2 diabetes mellitus without complications: Secondary | ICD-10-CM | POA: Diagnosis not present

## 2021-07-15 ENCOUNTER — Other Ambulatory Visit (HOSPITAL_COMMUNITY): Payer: Self-pay

## 2021-07-18 ENCOUNTER — Ambulatory Visit: Payer: BC Managed Care – PPO | Admitting: Family

## 2021-07-21 ENCOUNTER — Telehealth: Payer: Self-pay | Admitting: Family

## 2021-07-21 ENCOUNTER — Ambulatory Visit (HOSPITAL_BASED_OUTPATIENT_CLINIC_OR_DEPARTMENT_OTHER): Payer: BC Managed Care – PPO | Admitting: Family

## 2021-07-21 ENCOUNTER — Encounter: Payer: Self-pay | Admitting: Family

## 2021-07-21 ENCOUNTER — Other Ambulatory Visit: Payer: Self-pay

## 2021-07-21 ENCOUNTER — Other Ambulatory Visit
Admission: RE | Admit: 2021-07-21 | Discharge: 2021-07-21 | Disposition: A | Discharge status: Home / Self Care | Payer: BC Managed Care – PPO | Source: Ambulatory Visit | Attending: Family | Admitting: Family

## 2021-07-21 VITALS — BP 117/78 | HR 85 | Resp 20 | Ht 77.0 in | Wt 311.1 lb

## 2021-07-21 DIAGNOSIS — E109 Type 1 diabetes mellitus without complications: Secondary | ICD-10-CM

## 2021-07-21 DIAGNOSIS — I5022 Chronic systolic (congestive) heart failure: Secondary | ICD-10-CM | POA: Diagnosis not present

## 2021-07-21 DIAGNOSIS — I10 Essential (primary) hypertension: Secondary | ICD-10-CM

## 2021-07-21 LAB — BASIC METABOLIC PANEL
Anion gap: 9 (ref 5–15)
BUN: 18 mg/dL (ref 6–20)
CO2: 25 mmol/L (ref 22–32)
Calcium: 9.2 mg/dL (ref 8.9–10.3)
Chloride: 103 mmol/L (ref 98–111)
Creatinine, Ser: 1.45 mg/dL — ABNORMAL HIGH (ref 0.61–1.24)
GFR, Estimated: 59 mL/min — ABNORMAL LOW (ref >60)
Glucose, Bld: 106 mg/dL — ABNORMAL HIGH (ref 70–99)
Potassium: 4.1 mmol/L (ref 3.5–5.1)
Sodium: 137 mmol/L (ref 135–145)

## 2021-07-21 NOTE — Telephone Encounter (Cosign Needed)
Called to discuss results of lab work, Cr 1.18 > 1.45 after starting spironolactone two weeks ago.  Advised to stop taking his lasix BID.  Advised to start taking lasix 40 mg QD.  Discussed need/indications for additional lasix (weight gain).  He will see his PCP within the next month. Verbalized understanding to request BMP from his PCP at the next visit.  Pt verbalized instructions of changing lasix to QD, and continuing his spironolactone.  Pt verbalized when to call clinic r/t HF s/s.

## 2021-07-21 NOTE — Progress Notes (Signed)
Patient ID: Camren Gerow, male    DOB: 1971-10-05, 49 y.o.   MRN: [id number]   Mr Saladino is a 50 y/o male with a history of DM, HTN, previous tobacco use and chronic heart failure.   Echo report from 06/04/21 revealed moderate LV systolic dysfunction with mild LVH, mild valvular regurgitation and estimated EF of 30% which remains unchanged since his previous echocardiogram in 2021. Echo report from 01/10/2019 reviewed and showed an EF of 15-20% along with trivial AR and mild pulmonic stenosis.   Catheterization done 01/13/2019 showed: Nonischemic cardiomyopathy, no significant CAD Mildly elevated wedge pressure,  EF severely depressed, global Hypokinesis, EF 20%  Has not been admitted or been in the ED in the last 6 months.   He presents today for a follow-up visit of his heart failure. He reports he has been doing well and has no complaints. Denies CP, dizziness, syncope, fatigue or edema. He reports slight dyspnea with exertion, but is able to work and walk around without much interference from his heart failure. Recently had echo with cardiology and saw them on 8/17, at that time spironolactone 12.5 mg was started. He has tolerated this well. He weighs at home daily and reports that he has gained weight, but not fluid, due to increased calorie consumption.   His weight is the same from his visit in June.   Past Medical History:  Diagnosis Date   CHF (congestive heart failure) ([place])    Diabetes mellitus without complication ([place])    Hypertension    Past Surgical History:  Procedure Laterality Date   AMPUTATION TOE Left 01/05/2020   Procedure: AMPUTATION TOE MPJ LEFT;  Surgeon: Samara Deist, DPM;  Location: [facility];  Service: Podiatry;  Laterality: Left;   FOOT SURGERY     RIGHT/LEFT HEART CATH AND CORONARY ANGIOGRAPHY N/A 01/13/2019   Procedure: RIGHT/LEFT HEART CATH AND CORONARY ANGIOGRAPHY;  Surgeon: Minna Merritts, MD;  Location: [place] [facility];  Service:  Cardiovascular;  Laterality: N/A;   Family History  Family history unknown: Yes   Social History   Tobacco Use   Smoking status: Former    Packs/day: 1.00    Types: Cigarettes    Quit date: 04/17/2017    Years since quitting: 4.2   Smokeless tobacco: Never  Substance Use Topics   Alcohol use: No   Allergies  Allergen Reactions   Bee Venom Anaphylaxis   Prior to Admission medications   Medication Sig Start Date End Date Taking? Authorizing Provider  cetirizine (ZYRTEC) 10 MG tablet Take 10 mg by mouth daily.   Yes [provider]  furosemide (LASIX) 40 MG tablet Take 1 tablet (40 mg total) by mouth 2 (two) times daily. 04/01/21 04/01/22 Yes Hackney, Otila Kluver A, FNP  insulin aspart protamine - aspart (NOVOLOG MIX 70/30 FLEXPEN) (70-30) 100 UNIT/ML FlexPen Inject 0.1 mLs (10 Units total) into the skin 2 (two) times daily. Patient taking differently: Inject 30 Units into the skin 2 (two) times daily. 01/15/19  Yes Dustin Flock, MD  isosorbide mononitrate (IMDUR) 30 MG 24 hr tablet Take 1 tablet by mouth once daily 03/13/21  Yes Darylene Price A, FNP  metoprolol succinate (TOPROL-XL) 50 MG 24 hr tablet Take 1 tablet (50 mg total) by mouth daily. Take with or immediately following a meal. 04/01/21  Yes Hackney, Otila Kluver A, FNP  potassium chloride SA (KLOR-CON) 20 MEQ tablet Take 1 tablet (20 mEq total) by mouth daily. 07/17/20  Yes Alisa Graff, FNP  sacubitril-valsartan (  ENTRESTO) 49-51 MG Take 1 tablet by mouth 2 (two) times daily. 01/14/21  Yes Darylene Price A, FNP  spironolactone (ALDACTONE) 25 MG tablet Take 12.5 mg by mouth daily.   Yes [provider]     Review of Systems  Constitutional:  Negative for appetite change, diaphoresis, fatigue and unexpected weight change.  HENT:  Negative for congestion, postnasal drip and sore throat.   Eyes: Negative.   Respiratory:  Positive for shortness of breath (only with physical exercise and exertion). Negative for cough and chest  tightness.   Cardiovascular:  Negative for chest pain, palpitations and leg swelling.  Gastrointestinal:  Negative for abdominal distention and abdominal pain.  Endocrine: Negative.   Genitourinary: Negative.   Musculoskeletal:  Negative for arthralgias, back pain and neck pain.  Skin: Negative.   Allergic/Immunologic: Negative.   Neurological:  Negative for dizziness, syncope and light-headedness.  Hematological:  Negative for adenopathy. Does not bruise/bleed easily.  Psychiatric/Behavioral:  Positive for sleep disturbance (sleeping on 2 pillows, usually wakes after 3 hours of sleep). Negative for dysphoric mood. The patient is not nervous/anxious.    Vitals:   07/21/21 1200  BP: 117/78  Pulse: 85  Resp: 20  SpO2: 100%  Weight: (!) 311 lb 2 oz (141.1 kg)  Height: '6\' 5"'$  (1.956 m)   Wt Readings from Last 3 Encounters:  07/21/21 (!) 311 lb 2 oz (141.1 kg)  05/05/21 (!) 311 lb 6 oz (141.2 kg)  04/01/21 (!) 307 lb (139.3 kg)   Lab Results  Component Value Date   CREATININE 1.18 04/01/2021   CREATININE 1.11 03/20/2020   CREATININE 1.00 01/05/2020    Physical Exam Vitals and nursing note reviewed.  Constitutional:      Appearance: Normal appearance. He is obese.  HENT:     Head: Normocephalic and atraumatic.  Neck:     Comments: No JVD Cardiovascular:     Rate and Rhythm: Normal rate and regular rhythm.     Pulses: Normal pulses.     Heart sounds: Normal heart sounds. No murmur heard.   No gallop.  Pulmonary:     Effort: Pulmonary effort is normal.     Breath sounds: No wheezing, rhonchi or rales.  Abdominal:     General: There is no distension.     Palpations: Abdomen is soft.     Tenderness: There is no abdominal tenderness.  Musculoskeletal:        General: No tenderness.     Cervical back: Normal range of motion and neck supple.     Right lower leg: No edema.  Skin:    General: Skin is warm and dry.  Neurological:     General: No focal deficit present.      Mental Status: He is alert and oriented to person, place, and time.  Psychiatric:        Mood and Affect: Mood normal.        Behavior: Behavior normal.        Thought Content: Thought content normal.    Assessment & Plan:  1: Chronic heart failure with reduced ejection fraction- - NYHA class II - euvolemic today - weighing daily; reminded to call for an overnight weight gain of >2 pounds or a weekly weight gain of >5 pounds - weight is same from last visit - encouraged to increase exercise, attempt to walk more - not adding salt and is trying to limit his sodium intake - saw cardiology [name]) 07/09/21 - participating in paramedicine program  -  on GDMT of entresto, spironolactone & metoprolol  - spironolactone 12.5 started by cardiology on 07/09/21  - obtain BMP today  - BNP 07/10/2019 was 968.0  2: HTN- - BP 117/78 - saw PCP [name]) 05/29/21 - BMP 04/01/21 reviewed and showed sodium 139, potassium 3.8, creatinine 1.18 and GFR >60  3: DM- - A1c 05/29/21 7.4% - checks BS daily, 105-130 range   Patient did not bring his medications nor a list. Each medication was verbally reviewed with the patient and he was encouraged to bring the bottles to every visit to confirm accuracy of list.   Return in 3 months or sooner for any questions/problems before then.    Return in 3 months weeks or sooner for any questions/problems before then.

## 2021-07-23 NOTE — Progress Notes (Signed)
Contacted Gary Bentley to see how was doing.  He says he is feeling better, he states has all his meds.  Reminded him we needed to reschedule his HF appt, rescheduled it for him and he states he will go.  He states working a lot.  He denies any problems and has no needs at this time.  Will remind him of his appt closer to tome.    [place] [phone]

## 2021-07-24 ENCOUNTER — Telehealth: Payer: Self-pay | Admitting: Family

## 2021-07-24 NOTE — Telephone Encounter (Signed)
LVM with patient regarding the approval of his [employer] patient assistance for Entresto. Left instruction with patient on how to call and set up delivery. He has been approved till the end of the year.   Gary Bentley, NT

## 2021-07-31 DIAGNOSIS — E119 Type 2 diabetes mellitus without complications: Secondary | ICD-10-CM | POA: Diagnosis not present

## 2021-07-31 DIAGNOSIS — F411 Generalized anxiety disorder: Secondary | ICD-10-CM | POA: Diagnosis not present

## 2021-07-31 DIAGNOSIS — I509 Heart failure, unspecified: Secondary | ICD-10-CM | POA: Diagnosis not present

## 2021-07-31 DIAGNOSIS — I10 Essential (primary) hypertension: Secondary | ICD-10-CM | POA: Diagnosis not present

## 2021-08-04 ENCOUNTER — Other Ambulatory Visit: Payer: Self-pay | Admitting: Family

## 2021-08-04 MED ORDER — SPIRONOLACTONE 25 MG PO TABS: 12.5000 mg | ORAL_TABLET | Freq: Every day | ORAL | 3 refills | Status: DC

## 2021-08-04 NOTE — Progress Notes (Addendum)
Patient says that he never picked up spironolactone that was ordered by [facility] cardiology so this RX was sent in today. Advised to stop the potassium supplement and will recheck labs in 1 week.

## 2021-08-05 ENCOUNTER — Telehealth (HOSPITAL_COMMUNITY): Payer: Self-pay

## 2021-08-05 ENCOUNTER — Other Ambulatory Visit: Payer: Self-pay | Admitting: Family

## 2021-08-05 NOTE — Telephone Encounter (Signed)
Gary Bentley contacted me yesterday to call in refills for his meds.  He states needs all of them.  When called, [facility] states has no refill for spirolactone and never received script or has never filled it.  Contacted Otila Kluver and after investigation through Epic was ordered from cardiology and she sent in script for it.  He needs blood work in 1 work and to stop potassium.  Explained this all to him and he appeared to understand.  Tina with HF clinic with schedule blood work for 1 week.  He is aware to cal if any concerns with new medications.   [place] [phone]

## 2021-08-13 ENCOUNTER — Inpatient Hospital Stay: Admission: RE | Admit: 2021-08-13 | Payer: BC Managed Care – PPO | Source: Ambulatory Visit

## 2021-08-15 ENCOUNTER — Other Ambulatory Visit: Payer: BC Managed Care – PPO

## 2021-08-19 DIAGNOSIS — E1142 Type 2 diabetes mellitus with diabetic polyneuropathy: Secondary | ICD-10-CM | POA: Diagnosis not present

## 2021-08-19 DIAGNOSIS — Z89412 Acquired absence of left great toe: Secondary | ICD-10-CM | POA: Diagnosis not present

## 2021-08-19 DIAGNOSIS — D2372 Other benign neoplasm of skin of left lower limb, including hip: Secondary | ICD-10-CM | POA: Diagnosis not present

## 2021-08-19 DIAGNOSIS — B351 Tinea unguium: Secondary | ICD-10-CM | POA: Diagnosis not present

## 2021-08-28 ENCOUNTER — Telehealth: Payer: Self-pay

## 2021-08-28 ENCOUNTER — Other Ambulatory Visit
Admission: RE | Admit: 2021-08-28 | Discharge: 2021-08-28 | Disposition: A | Discharge status: Home / Self Care | Payer: BC Managed Care – PPO | Source: Ambulatory Visit | Attending: Family | Admitting: Family

## 2021-08-28 ENCOUNTER — Inpatient Hospital Stay: Admission: RE | Admit: 2021-08-28 | Payer: BC Managed Care – PPO | Source: Ambulatory Visit

## 2021-08-28 DIAGNOSIS — I5033 Acute on chronic diastolic (congestive) heart failure: Secondary | ICD-10-CM | POA: Insufficient documentation

## 2021-08-28 LAB — BASIC METABOLIC PANEL
Anion gap: 7 (ref 5–15)
BUN: 14 mg/dL (ref 6–20)
CO2: 24 mmol/L (ref 22–32)
Calcium: 8.7 mg/dL — ABNORMAL LOW (ref 8.9–10.3)
Chloride: 107 mmol/L (ref 98–111)
Creatinine, Ser: 1.19 mg/dL (ref 0.61–1.24)
GFR, Estimated: >60 mL/min (ref >60)
Glucose, Bld: 180 mg/dL — ABNORMAL HIGH (ref 70–99)
Potassium: 4 mmol/L (ref 3.5–5.1)
Sodium: 138 mmol/L (ref 135–145)

## 2021-08-28 NOTE — Telephone Encounter (Addendum)
Called patient to inform him that lab results looked good and no changes are needed to medications at this time per Darylene Price, NP. Patient acknowledged. Georg Ruddle, RN [facility]

## 2021-09-10 ENCOUNTER — Other Ambulatory Visit (HOSPITAL_COMMUNITY): Payer: Self-pay

## 2021-09-16 NOTE — Progress Notes (Signed)
Spoke to [place] and he states doing well.  He states has all his medications and does not need refills.  He states working a lot.  He denies any problems.  He states edema is good.  He is aware of up coming appts.  He states has everything for daily living.  Hard to meet him face to face due to his work.  He is aware to call with any problems or needing medications.  Will continue to assist him in anyway possible.    [place] [phone]

## 2021-10-08 DIAGNOSIS — Z189 Retained foreign body fragments, unspecified material: Secondary | ICD-10-CM | POA: Diagnosis not present

## 2021-10-08 DIAGNOSIS — Z1810 Retained metal fragments, unspecified: Secondary | ICD-10-CM | POA: Diagnosis not present

## 2021-10-19 NOTE — Progress Notes (Deleted)
Patient ID: Gary Bentley, male    DOB: Apr 08, 1971, 50 y.o.   MRN: [id number]   Gary Bentley is a 50 y/o male with a history of DM, HTN, previous tobacco use and chronic heart failure.   Echo report from 06/04/21 revealed moderate LV systolic dysfunction with mild LVH, mild valvular regurgitation and estimated EF of 30% which remains unchanged since his previous echocardiogram in 2021. Echo report from 01/10/2019 reviewed and showed an EF of 15-20% along with trivial AR and mild pulmonic stenosis.   Catheterization done 01/13/2019 showed: Nonischemic cardiomyopathy, no significant CAD Mildly elevated wedge pressure,  EF severely depressed, global Hypokinesis, EF 20%  Has not been admitted or been in the ED in the last 6 months.   He presents today for a follow-up visit with a chief complaint of   Past Medical History:  Diagnosis Date   CHF (congestive heart failure) ([place])    Diabetes mellitus without complication ([place])    Hypertension    Past Surgical History:  Procedure Laterality Date   AMPUTATION TOE Left 01/05/2020   Procedure: AMPUTATION TOE MPJ LEFT;  Surgeon: Samara Deist, DPM;  Location: [facility];  Service: Podiatry;  Laterality: Left;   FOOT SURGERY     RIGHT/LEFT HEART CATH AND CORONARY ANGIOGRAPHY N/A 01/13/2019   Procedure: RIGHT/LEFT HEART CATH AND CORONARY ANGIOGRAPHY;  Surgeon: Minna Merritts, MD;  Location: [place] [facility];  Service: Cardiovascular;  Laterality: N/A;   Family History  Family history unknown: Yes   Social History   Tobacco Use   Smoking status: Former    Packs/day: 1.00    Types: Cigarettes    Quit date: 04/17/2017    Years since quitting: 4.5   Smokeless tobacco: Never  Substance Use Topics   Alcohol use: No   Allergies  Allergen Reactions   Bee Venom Anaphylaxis      Review of Systems  Constitutional:  Negative for appetite change, diaphoresis, fatigue and unexpected weight change.  HENT:  Negative for congestion, postnasal  drip and sore throat.   Eyes: Negative.   Respiratory:  Positive for shortness of breath (only with physical exercise and exertion). Negative for cough and chest tightness.   Cardiovascular:  Negative for chest pain, palpitations and leg swelling.  Gastrointestinal:  Negative for abdominal distention and abdominal pain.  Endocrine: Negative.   Genitourinary: Negative.   Musculoskeletal:  Negative for arthralgias, back pain and neck pain.  Skin: Negative.   Allergic/Immunologic: Negative.   Neurological:  Negative for dizziness, syncope and light-headedness.  Hematological:  Negative for adenopathy. Does not bruise/bleed easily.  Psychiatric/Behavioral:  Positive for sleep disturbance (sleeping on 2 pillows, usually wakes after 3 hours of sleep). Negative for dysphoric mood. The patient is not nervous/anxious.       Physical Exam Vitals and nursing note reviewed.  Constitutional:      Appearance: Normal appearance. He is obese.  HENT:     Head: Normocephalic and atraumatic.  Neck:     Comments: No JVD Cardiovascular:     Rate and Rhythm: Normal rate and regular rhythm.     Pulses: Normal pulses.     Heart sounds: Normal heart sounds. No murmur heard.   No gallop.  Pulmonary:     Effort: Pulmonary effort is normal.     Breath sounds: No wheezing, rhonchi or rales.  Abdominal:     General: There is no distension.     Palpations: Abdomen is soft.     Tenderness: There  is no abdominal tenderness.  Musculoskeletal:        General: No tenderness.     Cervical back: Normal range of motion and neck supple.     Right lower leg: No edema.  Skin:    General: Skin is warm and dry.  Neurological:     General: No focal deficit present.     Mental Status: He is alert and oriented to person, place, and time.  Psychiatric:        Mood and Affect: Mood normal.        Behavior: Behavior normal.        Thought Content: Thought content normal.    Assessment & Plan:  1: Chronic heart  failure with reduced ejection fraction- - NYHA class II - euvolemic today - weighing daily; reminded to call for an overnight weight gain of >2 pounds or a weekly weight gain of >5 pounds - weight 311.2 from last visit here 3 months ago - encouraged to increase exercise, attempt to walk more - not adding salt and is trying to limit his sodium intake - saw cardiology [name]) 07/09/21 - participating in paramedicine program  - on GDMT of entresto, spironolactone & metoprolol  - BNP 07/10/2019 was 968.0  2: HTN- - BP  - saw PCP [name]) 07/31/21 - BMP 08/28/21 reviewed and showed sodium 138, potassium 4.0, creatinine 1.19 and GFR >60  3: DM- - A1c 05/29/21 7.4% - checks BS daily   Patient did not bring his medications nor a list. Each medication was verbally reviewed with the patient and he was encouraged to bring the bottles to every visit to confirm accuracy of list.

## 2021-10-20 ENCOUNTER — Ambulatory Visit: Payer: BC Managed Care – PPO | Admitting: Family

## 2021-10-20 ENCOUNTER — Other Ambulatory Visit (HOSPITAL_COMMUNITY): Payer: Self-pay

## 2021-10-20 NOTE — Progress Notes (Signed)
Today spoke to Gary Bentley.  He is doing well, working a lot.  He has a HF clinic appt today but needs to reschedule it due to work.  Rescheduled for him to next week. He denies having any problems and states he has all his meds.  Will continue to visit for heart failure, diet and medication compliance.   [place] [phone]

## 2021-10-24 DIAGNOSIS — R6889 Other general symptoms and signs: Secondary | ICD-10-CM | POA: Diagnosis not present

## 2021-10-24 DIAGNOSIS — I959 Hypotension, unspecified: Secondary | ICD-10-CM | POA: Diagnosis not present

## 2021-10-24 DIAGNOSIS — U071 COVID-19: Secondary | ICD-10-CM | POA: Diagnosis not present

## 2021-10-29 ENCOUNTER — Ambulatory Visit: Payer: BC Managed Care – PPO | Admitting: Family

## 2021-10-29 ENCOUNTER — Other Ambulatory Visit (HOSPITAL_COMMUNITY): Payer: Self-pay

## 2021-10-31 ENCOUNTER — Ambulatory Visit: Payer: BC Managed Care – PPO | Admitting: Family

## 2021-10-31 NOTE — Progress Notes (Signed)
Gary Bentley advised me he has covid.  He staes doing ok with it.  Rescheduled his HF clinic appt for him that he had this week.  He states running out of his meds, contacted his pharmacy and they are refilling them, he advised he can go pick them up.  He denies any problems such as chest pain, headaches, dizziness or increased shortness of breath.  Will continue to visit for heart failure, diet and medication management.   [place] [phone]

## 2021-11-12 ENCOUNTER — Ambulatory Visit: Payer: BC Managed Care – PPO | Admitting: Family

## 2021-11-20 ENCOUNTER — Ambulatory Visit: Payer: BC Managed Care – PPO | Admitting: Family

## 2021-11-20 ENCOUNTER — Telehealth: Payer: Self-pay | Admitting: Family

## 2021-11-20 NOTE — Telephone Encounter (Signed)
Patient did not show for his Heart Failure Clinic appointment on 11/20/21. Will attempt to reschedule.

## 2021-12-04 DIAGNOSIS — E1142 Type 2 diabetes mellitus with diabetic polyneuropathy: Secondary | ICD-10-CM | POA: Diagnosis not present

## 2021-12-04 DIAGNOSIS — D2372 Other benign neoplasm of skin of left lower limb, including hip: Secondary | ICD-10-CM | POA: Diagnosis not present

## 2021-12-04 DIAGNOSIS — B351 Tinea unguium: Secondary | ICD-10-CM | POA: Diagnosis not present

## 2021-12-16 NOTE — Progress Notes (Signed)
Patient ID: Gary Bentley, male    DOB: March 13, 1971, 51 y.o.   MRN: [id number]   Mr Boudoin is a 51 y/o male with a history of DM, HTN, previous tobacco use and chronic heart failure.   Echo report from 06/04/21 revealed moderate LV systolic dysfunction with mild LVH, mild valvular regurgitation and estimated EF of 30% which remains unchanged since his previous echocardiogram in 2021. Echo report from 01/10/2019 reviewed and showed an EF of 15-20% along with trivial AR and mild pulmonic stenosis.   Catheterization done 01/13/2019 showed: Nonischemic cardiomyopathy, no significant CAD Mildly elevated wedge pressure,  EF severely depressed, global Hypokinesis, EF 20%  Has not been admitted or been in the ED in the last 6 months.   He presents today for a follow-up visit of his heart failure. He currently voices no complaints and specifically denies any fatigue, cough, shortness of breath, chest pain, pedal edema, palpitations, abdominal distention, dizziness, difficulty sleeping or weight gain.   Says that he's been weighing daily and has been watching his portion sizes and food intake. He's currently been taking his furosemide just once daily.   Past Medical History:  Diagnosis Date   CHF (congestive heart failure) ([place])    Diabetes mellitus without complication ([place])    Hypertension    Past Surgical History:  Procedure Laterality Date   AMPUTATION TOE Left 01/05/2020   Procedure: AMPUTATION TOE MPJ LEFT;  Surgeon: Samara Deist, DPM;  Location: [facility];  Service: Podiatry;  Laterality: Left;   FOOT SURGERY     RIGHT/LEFT HEART CATH AND CORONARY ANGIOGRAPHY N/A 01/13/2019   Procedure: RIGHT/LEFT HEART CATH AND CORONARY ANGIOGRAPHY;  Surgeon: Minna Merritts, MD;  Location: [place] [facility];  Service: Cardiovascular;  Laterality: N/A;   Family History  Family history unknown: Yes   Social History   Tobacco Use   Smoking status: Former    Packs/day: 1.00    Types:  Cigarettes    Quit date: 04/17/2017    Years since quitting: 4.6   Smokeless tobacco: Never  Substance Use Topics   Alcohol use: No   Allergies  Allergen Reactions   Bee Venom Anaphylaxis   Prior to Admission medications   Medication Sig Start Date End Date Taking? Authorizing Provider  cetirizine (ZYRTEC) 10 MG tablet Take 10 mg by mouth daily.   Yes [provider]  insulin aspart protamine - aspart (NOVOLOG MIX 70/30 FLEXPEN) (70-30) 100 UNIT/ML FlexPen Inject 0.1 mLs (10 Units total) into the skin 2 (two) times daily. Patient taking differently: Inject 30 Units into the skin 2 (two) times daily. 01/15/19  Yes Dustin Flock, MD  isosorbide mononitrate (IMDUR) 30 MG 24 hr tablet Take 1 tablet by mouth once daily 03/13/21  Yes Darylene Price A, FNP  metoprolol succinate (TOPROL-XL) 50 MG 24 hr tablet Take 1 tablet (50 mg total) by mouth daily. Take with or immediately following a meal. 04/01/21  Yes Quetzali Heinle A, FNP  sacubitril-valsartan (ENTRESTO) 49-51 MG Take 1 tablet by mouth 2 (two) times daily. 01/14/21  Yes Darylene Price A, FNP  spironolactone (ALDACTONE) 25 MG tablet TAKE 1/2 (ONE-HALF) TABLET BY MOUTH ONCE DAILY 08/05/21  Yes Darylene Price A, FNP  furosemide (LASIX) 40 MG tablet Take 1 tablet (40 mg total) by mouth 2 (two) times daily. Patient taking differently: Take 40 mg by mouth daily. 04/01/21 04/01/22  Alisa Graff, FNP   Review of Systems  Constitutional:  Negative for appetite change, diaphoresis and fatigue.  HENT:  Negative for congestion, postnasal drip and sore throat.   Eyes: Negative.   Respiratory:  Negative for cough, chest tightness and shortness of breath.   Cardiovascular:  Negative for chest pain, palpitations and leg swelling.  Gastrointestinal:  Negative for abdominal distention and abdominal pain.  Endocrine: Negative.   Genitourinary: Negative.   Musculoskeletal:  Negative for arthralgias, back pain and neck pain.  Skin: Negative.    Allergic/Immunologic: Negative.   Neurological:  Negative for dizziness, syncope and light-headedness.  Hematological:  Negative for adenopathy. Does not bruise/bleed easily.  Psychiatric/Behavioral:  Negative for dysphoric mood and sleep disturbance (sleeping on 2 pillows). The patient is not nervous/anxious.    Vitals:   12/17/21 0910  BP: 126/89  Pulse: 98  Resp: 20  SpO2: 99%  Weight: 297 lb (134.7 kg)  Height: 6\' 5"  (1.956 m)   Wt Readings from Last 3 Encounters:  12/17/21 297 lb (134.7 kg)  07/21/21 (!) 311 lb 2 oz (141.1 kg)  05/05/21 (!) 311 lb 6 oz (141.2 kg)   Lab Results  Component Value Date   CREATININE 1.19 08/28/2021   CREATININE 1.45 (H) 07/21/2021   CREATININE 1.18 04/01/2021    Physical Exam Vitals and nursing note reviewed.  Constitutional:      Appearance: Normal appearance.  HENT:     Head: Normocephalic and atraumatic.  Neck:     Comments: No JVD Cardiovascular:     Rate and Rhythm: Normal rate and regular rhythm.     Pulses: Normal pulses.     Heart sounds: Normal heart sounds. No murmur heard.   No gallop.  Pulmonary:     Effort: Pulmonary effort is normal.     Breath sounds: No wheezing, rhonchi or rales.  Abdominal:     General: There is no distension.     Palpations: Abdomen is soft.     Tenderness: There is no abdominal tenderness.  Musculoskeletal:        General: No tenderness.     Cervical back: Normal range of motion and neck supple.     Right lower leg: No edema.  Skin:    General: Skin is warm and dry.  Neurological:     General: No focal deficit present.     Mental Status: He is alert and oriented to person, place, and time.  Psychiatric:        Mood and Affect: Mood normal.        Behavior: Behavior normal.        Thought Content: Thought content normal.    Assessment & Plan:  1: Chronic heart failure with reduced ejection fraction- - NYHA class I - euvolemic today - weighing daily; reminded to call for an  overnight weight gain of >2 pounds or a weekly weight gain of >5 pounds - weight down 14 pounds from last visit here 5 months ago - has been increasing his activity along with monitoring portion sizes - not adding salt and is trying to limit his sodium intake - saw cardiology [name]) 07/09/21 - participating in paramedicine program  - on GDMT of entresto, spironolactone & metoprolol  - will add farxiga 10mg  daily; 30 day voucher provided - when starting farxiga, advised him to use his furosemide PRN only for above weight gain, swelling or shortness of breath - check BMP at next visit - BNP 07/10/2019 was 968.0 - PharmD reconciled medications with the patient  2: HTN- - BP looks good (126/89) - saw PCP [name]) 07/31/21 -  BMP 08/28/21 reviewed and showed sodium 138, potassium 4.0, creatinine 1.19 and GFR >60  3: DM- - A1c 05/29/21 7.4% - checks BS daily   Medication bottles reviewed.   Return in 1 month, sooner if needed.

## 2021-12-17 ENCOUNTER — Ambulatory Visit: Payer: BC Managed Care – PPO | Attending: Family | Admitting: Family

## 2021-12-17 ENCOUNTER — Other Ambulatory Visit: Payer: Self-pay

## 2021-12-17 ENCOUNTER — Encounter: Payer: Self-pay | Admitting: Family

## 2021-12-17 VITALS — BP 126/89 | HR 98 | Resp 20 | Ht 77.0 in | Wt 297.0 lb

## 2021-12-17 DIAGNOSIS — I428 Other cardiomyopathies: Secondary | ICD-10-CM | POA: Diagnosis not present

## 2021-12-17 DIAGNOSIS — I5022 Chronic systolic (congestive) heart failure: Secondary | ICD-10-CM | POA: Diagnosis not present

## 2021-12-17 DIAGNOSIS — Z87891 Personal history of nicotine dependence: Secondary | ICD-10-CM | POA: Diagnosis not present

## 2021-12-17 DIAGNOSIS — I110 Hypertensive heart disease with heart failure: Secondary | ICD-10-CM | POA: Diagnosis not present

## 2021-12-17 DIAGNOSIS — I10 Essential (primary) hypertension: Secondary | ICD-10-CM

## 2021-12-17 DIAGNOSIS — E119 Type 2 diabetes mellitus without complications: Secondary | ICD-10-CM | POA: Diagnosis not present

## 2021-12-17 DIAGNOSIS — Z79899 Other long term (current) drug therapy: Secondary | ICD-10-CM | POA: Insufficient documentation

## 2021-12-17 DIAGNOSIS — E1159 Type 2 diabetes mellitus with other circulatory complications: Secondary | ICD-10-CM | POA: Diagnosis not present

## 2021-12-17 DIAGNOSIS — I38 Endocarditis, valve unspecified: Secondary | ICD-10-CM | POA: Diagnosis not present

## 2021-12-17 DIAGNOSIS — Z794 Long term (current) use of insulin: Secondary | ICD-10-CM

## 2021-12-17 MED ORDER — DAPAGLIFLOZIN PROPANEDIOL 10 MG PO TABS: 10.0000 mg | ORAL_TABLET | Freq: Every day | ORAL | 5 refills | Status: DC

## 2021-12-17 NOTE — Patient Instructions (Addendum)
Continue weighing daily and call for an overnight weight gain of 3 pounds or more or a weekly weight gain of more than 5 pounds.   The [facility] will be moving around the corner to [id number] mid-February. Our phone number will remain the same    Begin farxiga as 1 tablet every morning. When you start this medication, you will only take the furosemide (lasix) if you need it for above weight gain, swelling or shortness of breath.

## 2021-12-23 ENCOUNTER — Telehealth (HOSPITAL_COMMUNITY): Payer: Self-pay

## 2021-12-23 NOTE — Telephone Encounter (Signed)
Spoke with Gary Bentley, he states doing well and has all his medications.  He states needed to know his medication changes from Tina's HF clinic appt.  Went over his meds with him and he appears to understand the changes now.  He denies any problems today such as chest pain, headaches, dizziness or increased of shortness of breath.  He works daily.  He is aware of up coming appts.  Will continue to visit for heart failure, diet and medication management.   [place] [phone]

## 2022-01-02 DIAGNOSIS — I428 Other cardiomyopathies: Secondary | ICD-10-CM | POA: Insufficient documentation

## 2022-01-02 DIAGNOSIS — E66811 Obesity, class 1: Secondary | ICD-10-CM | POA: Insufficient documentation

## 2022-01-02 DIAGNOSIS — Z720 Tobacco use: Secondary | ICD-10-CM | POA: Insufficient documentation

## 2022-01-02 DIAGNOSIS — E669 Obesity, unspecified: Secondary | ICD-10-CM | POA: Insufficient documentation

## 2022-01-02 DIAGNOSIS — I10 Essential (primary) hypertension: Secondary | ICD-10-CM | POA: Diagnosis not present

## 2022-01-02 DIAGNOSIS — I5020 Unspecified systolic (congestive) heart failure: Secondary | ICD-10-CM | POA: Diagnosis present

## 2022-01-02 DIAGNOSIS — E119 Type 2 diabetes mellitus without complications: Secondary | ICD-10-CM | POA: Diagnosis not present

## 2022-01-13 ENCOUNTER — Other Ambulatory Visit (HOSPITAL_COMMUNITY): Payer: Self-pay

## 2022-01-13 NOTE — Progress Notes (Unsigned)
Patient ID: Gary Bentley, male    DOB: 03/29/1971, 51 y.o.   MRN: [id number]   Mr Tunney is a 51 y/o male with a history of DM, HTN, previous tobacco use and chronic heart failure.   Echo report from 06/04/21 revealed moderate LV systolic dysfunction with mild LVH, mild valvular regurgitation and estimated EF of 30% which remains unchanged since his previous echocardiogram in 2021. Echo report from 01/10/2019 reviewed and showed an EF of 15-20% along with trivial AR and mild pulmonic stenosis.   Catheterization done 01/13/2019 showed: Nonischemic cardiomyopathy, no significant CAD Mildly elevated wedge pressure,  EF severely depressed, global Hypokinesis, EF 20%  Has not been admitted or been in the ED in the last 6 months.   He presents today for a follow-up visit of his heart failure.    Past Medical History:  Diagnosis Date   CHF (congestive heart failure) ([place])    Diabetes mellitus without complication ([place])    Hypertension    Past Surgical History:  Procedure Laterality Date   AMPUTATION TOE Left 01/05/2020   Procedure: AMPUTATION TOE MPJ LEFT;  Surgeon: Samara Deist, DPM;  Location: [facility];  Service: Podiatry;  Laterality: Left;   FOOT SURGERY     RIGHT/LEFT HEART CATH AND CORONARY ANGIOGRAPHY N/A 01/13/2019   Procedure: RIGHT/LEFT HEART CATH AND CORONARY ANGIOGRAPHY;  Surgeon: Minna Merritts, MD;  Location: [place] [facility];  Service: Cardiovascular;  Laterality: N/A;   Family History  Family history unknown: Yes   Social History   Tobacco Use   Smoking status: Former    Packs/day: 1.00    Types: Cigarettes    Quit date: 04/17/2017    Years since quitting: 4.7   Smokeless tobacco: Never  Substance Use Topics   Alcohol use: No   Allergies  Allergen Reactions   Bee Venom Anaphylaxis    Review of Systems  Constitutional:  Negative for appetite change, diaphoresis and fatigue.  HENT:  Negative for congestion, postnasal drip and sore throat.   Eyes:  Negative.   Respiratory:  Negative for cough, chest tightness and shortness of breath.   Cardiovascular:  Negative for chest pain, palpitations and leg swelling.  Gastrointestinal:  Negative for abdominal distention and abdominal pain.  Endocrine: Negative.   Genitourinary: Negative.   Musculoskeletal:  Negative for arthralgias, back pain and neck pain.  Skin: Negative.   Allergic/Immunologic: Negative.   Neurological:  Negative for dizziness, syncope and light-headedness.  Hematological:  Negative for adenopathy. Does not bruise/bleed easily.  Psychiatric/Behavioral:  Negative for dysphoric mood and sleep disturbance (sleeping on 2 pillows). The patient is not nervous/anxious.       Physical Exam Vitals and nursing note reviewed.  Constitutional:      Appearance: Normal appearance.  HENT:     Head: Normocephalic and atraumatic.  Neck:     Comments: No JVD Cardiovascular:     Rate and Rhythm: Normal rate and regular rhythm.     Pulses: Normal pulses.     Heart sounds: Normal heart sounds. No murmur heard.   No gallop.  Pulmonary:     Effort: Pulmonary effort is normal.     Breath sounds: No wheezing, rhonchi or rales.  Abdominal:     General: There is no distension.     Palpations: Abdomen is soft.     Tenderness: There is no abdominal tenderness.  Musculoskeletal:        General: No tenderness.     Cervical back: Normal range  of motion and neck supple.     Right lower leg: No edema.  Skin:    General: Skin is warm and dry.  Neurological:     General: No focal deficit present.     Mental Status: He is alert and oriented to person, place, and time.  Psychiatric:        Mood and Affect: Mood normal.        Behavior: Behavior normal.        Thought Content: Thought content normal.    Assessment & Plan:  1: Chronic heart failure with reduced ejection fraction- - NYHA class I - euvolemic today - weighing daily; reminded to call for an overnight weight gain of >2  pounds or a weekly weight gain of >5 pounds - weight 297 pounds from last visit here 1 month ago - has been increasing his activity along with monitoring portion sizes - not adding salt and is trying to limit his sodium intake - saw cardiology [name]) 07/09/21 - participating in paramedicine program  - on GDMT of entresto, spironolactone, farxiga & metoprolol  - check BMP today since farxiga added at last visit - BNP 07/10/2019 was 968.0 - PharmD reconciled medications with the patient  2: HTN- - BP  - saw PCP [name]) 07/31/21 - BMP 08/28/21 reviewed and showed sodium 138, potassium 4.0, creatinine 1.19 and GFR >60  3: DM- - A1c 05/29/21 7.4% - checks BS daily   Medication bottles reviewed.

## 2022-01-14 ENCOUNTER — Ambulatory Visit: Payer: BC Managed Care – PPO | Admitting: Family

## 2022-01-19 NOTE — Progress Notes (Signed)
Gary Bentley states he is doing good.  Working a lot.  He states he needs to change his HF clinic appt to later date, he is unable to make this appt.  Contacted HF clinic and changed date and time he is good with. He states he has all his medications and doing well.  Will continue to visit for heart failure.   [place] [phone]

## 2022-01-21 ENCOUNTER — Telehealth (HOSPITAL_COMMUNITY): Payer: Self-pay

## 2022-01-21 ENCOUNTER — Other Ambulatory Visit: Payer: Self-pay | Admitting: Family

## 2022-01-21 DIAGNOSIS — I5022 Chronic systolic (congestive) heart failure: Secondary | ICD-10-CM

## 2022-01-21 MED ORDER — SACUBITRIL-VALSARTAN 49-51 MG PO TABS: 1.0000 | ORAL_TABLET | Freq: Two times a day (BID) | ORAL | 5 refills | Status: DC

## 2022-01-21 NOTE — Telephone Encounter (Signed)
Gary Bentley advised me he is running out of meds.  He is wanting to switch pharmacies to [place] in [place] at [facility].  Called them and had them to fill his scripts for him.  Also requested Gary Bentley at HF clinic to send new [facility] script, they advised they needed a new one.  Gary Bentley they should be ready this evening.   ? ?[name] ?[place] EMT-Paramedic ?[phone] ?

## 2022-01-22 ENCOUNTER — Ambulatory Visit: Payer: BC Managed Care – PPO | Admitting: Family

## 2022-01-23 ENCOUNTER — Ambulatory Visit: Payer: BC Managed Care – PPO | Admitting: Family

## 2022-01-27 ENCOUNTER — Other Ambulatory Visit
Admission: RE | Admit: 2022-01-27 | Discharge: 2022-01-27 | Disposition: A | Discharge status: Home / Self Care | Payer: BC Managed Care – PPO | Source: Ambulatory Visit | Attending: Family | Admitting: Family

## 2022-01-27 ENCOUNTER — Other Ambulatory Visit: Payer: Self-pay | Admitting: Family

## 2022-01-27 ENCOUNTER — Encounter: Payer: Self-pay | Admitting: Family

## 2022-01-27 ENCOUNTER — Telehealth: Payer: Self-pay | Admitting: Family

## 2022-01-27 ENCOUNTER — Encounter: Payer: Self-pay | Admitting: Pharmacist

## 2022-01-27 ENCOUNTER — Other Ambulatory Visit: Payer: Self-pay

## 2022-01-27 ENCOUNTER — Ambulatory Visit (HOSPITAL_BASED_OUTPATIENT_CLINIC_OR_DEPARTMENT_OTHER): Payer: BC Managed Care – PPO | Admitting: Family

## 2022-01-27 VITALS — BP 121/76 | HR 87 | Resp 20 | Ht 77.0 in | Wt 288.4 lb

## 2022-01-27 DIAGNOSIS — Z794 Long term (current) use of insulin: Secondary | ICD-10-CM

## 2022-01-27 DIAGNOSIS — Z87891 Personal history of nicotine dependence: Secondary | ICD-10-CM | POA: Insufficient documentation

## 2022-01-27 DIAGNOSIS — E119 Type 2 diabetes mellitus without complications: Secondary | ICD-10-CM | POA: Insufficient documentation

## 2022-01-27 DIAGNOSIS — I10 Essential (primary) hypertension: Secondary | ICD-10-CM | POA: Diagnosis not present

## 2022-01-27 DIAGNOSIS — I5022 Chronic systolic (congestive) heart failure: Secondary | ICD-10-CM | POA: Insufficient documentation

## 2022-01-27 DIAGNOSIS — I110 Hypertensive heart disease with heart failure: Secondary | ICD-10-CM | POA: Insufficient documentation

## 2022-01-27 LAB — BASIC METABOLIC PANEL
Anion gap: 9 (ref 5–15)
BUN: 15 mg/dL (ref 6–20)
CO2: 23 mmol/L (ref 22–32)
Calcium: 9.4 mg/dL (ref 8.9–10.3)
Chloride: 102 mmol/L (ref 98–111)
Creatinine, Ser: 1.15 mg/dL (ref 0.61–1.24)
GFR, Estimated: >60 mL/min (ref >60)
Glucose, Bld: 337 mg/dL — ABNORMAL HIGH (ref 70–99)
Potassium: 4 mmol/L (ref 3.5–5.1)
Sodium: 134 mmol/L — ABNORMAL LOW (ref 135–145)

## 2022-01-27 MED ORDER — SPIRONOLACTONE 25 MG PO TABS: 12.5000 mg | ORAL_TABLET | Freq: Every day | ORAL | 3 refills | Status: DC

## 2022-01-27 NOTE — Patient Instructions (Addendum)
If you have voicemail, please make sure your mailbox is cleaned out so that we may leave a message and please make sure to listen to any voicemails.  ? ?Return on 02/03/22 @ 10 am for labs, you do not need to see Otila Kluver, just get lab work. ? ?Pick up your spironolactone (aldactone). DO NOT take until we call you and let you know your labwork is ok. ? ?Return in 1 month.  ?

## 2022-01-27 NOTE — Progress Notes (Signed)
? Patient ID: Gary Bentley, male    DOB: 06-11-1971, 51 y.o.   MRN: [id number] ? ? ?Mr Kloss is a 51 y/o male with a history of DM, HTN, previous tobacco use and chronic heart failure.  ? ?Echo report from 06/04/21 revealed moderate LV systolic dysfunction with mild LVH, mild valvular regurgitation and estimated EF of 30% which remains unchanged since his previous echocardiogram in 2021. Echo report from 01/10/2019 reviewed and showed an EF of 15-20% along with trivial AR and mild pulmonic stenosis.  ? ?Catheterization done 01/13/2019 showed: ?Nonischemic cardiomyopathy, no significant CAD ?Mildly elevated wedge pressure,  ?EF severely depressed, global Hypokinesis, EF 20% ? ?Has not been admitted or been in the ED in the last 6 months.  ? ?He presents today for a follow-up visit of his heart failure. He currently voices no complaints and specifically denies any fatigue, cough, shortness of breath, chest pain, pedal edema, palpitations, abdominal distention, dizziness, difficulty sleeping or weight gain.  ? ?Says that he's been weighing daily and has been watching his portion sizes and food intake. He has intentionally lost 11 lbs from his last visit.  ? ?Past Medical History:  ?Diagnosis Date  ? CHF (congestive heart failure) ([place])   ? Diabetes mellitus without complication ([place])   ? Hypertension   ? ?Past Surgical History:  ?Procedure Laterality Date  ? AMPUTATION TOE Left 01/05/2020  ? Procedure: AMPUTATION TOE MPJ LEFT;  Surgeon: Samara Deist, DPM;  Location: [facility];  Service: Podiatry;  Laterality: Left;  ? FOOT SURGERY    ? RIGHT/LEFT HEART CATH AND CORONARY ANGIOGRAPHY N/A 01/13/2019  ? Procedure: RIGHT/LEFT HEART CATH AND CORONARY ANGIOGRAPHY;  Surgeon: Minna Merritts, MD;  Location: [place] [facility];  Service: Cardiovascular;  Laterality: N/A;  ? ?Family History  ?Family history unknown: Yes  ? ?Social History  ? ?Tobacco Use  ? Smoking status: Former  ?  Packs/day: 1.00  ?  Types: Cigarettes  ?   Quit date: 04/17/2017  ?  Years since quitting: 4.7  ? Smokeless tobacco: Never  ?Substance Use Topics  ? Alcohol use: No  ? ?Allergies  ?Allergen Reactions  ? Bee Venom Anaphylaxis  ? ?Prior to Admission medications   ?Medication Sig Start Date End Date Taking? Authorizing Provider  ?cetirizine (ZYRTEC) 10 MG tablet Take 10 mg by mouth daily.   Yes [provider]  ?insulin aspart protamine - aspart (NOVOLOG MIX 70/30 FLEXPEN) (70-30) 100 UNIT/ML FlexPen Inject 0.1 mLs (10 Units total) into the skin 2 (two) times daily. ?Patient taking differently: Inject 30 Units into the skin 2 (two) times daily. 01/15/19  Yes Dustin Flock, MD  ?isosorbide mononitrate (IMDUR) 30 MG 24 hr tablet Take 1 tablet by mouth once daily 03/13/21  Yes Alisa Graff, FNP  ?metoprolol succinate (TOPROL-XL) 50 MG 24 hr tablet Take 1 tablet (50 mg total) by mouth daily. Take with or immediately following a meal. 04/01/21  Yes Alisa Graff, FNP  ?sacubitril-valsartan (ENTRESTO) 49-51 MG Take 1 tablet by mouth 2 (two) times daily. 01/14/21  Yes Alisa Graff, FNP  ?spironolactone (ALDACTONE) 25 MG tablet TAKE 1/2 (ONE-HALF) TABLET BY MOUTH ONCE DAILY 08/05/21  Yes Alisa Graff, FNP  ?furosemide (LASIX) 40 MG tablet Take 1 tablet (40 mg total) by mouth 2 (two) times daily. ?Patient taking differently: Take 40 mg by mouth as needed 04/01/21 04/01/22  Alisa Graff, FNP  ? ?Review of Systems  ?Constitutional:  Negative for appetite change, diaphoresis  and fatigue.  ?HENT:  Negative for congestion, postnasal drip and sore throat.   ?Eyes: Negative.   ?Respiratory:  Negative for cough, chest tightness and shortness of breath.   ?Cardiovascular:  Negative for chest pain, palpitations and leg swelling.  ?Gastrointestinal:  Negative for abdominal distention and abdominal pain.  ?Endocrine: Negative.   ?Genitourinary: Negative.   ?Musculoskeletal:  Negative for arthralgias, back pain and neck pain.  ?Skin: Negative.    ?Allergic/Immunologic: Negative.   ?Neurological:  Negative for dizziness, syncope and light-headedness.  ?Hematological:  Negative for adenopathy. Does not bruise/bleed easily.  ?Psychiatric/Behavioral:  Negative for dysphoric mood and sleep disturbance (sleeping on 2 pillows). The patient is not nervous/anxious.   ? ?Vitals:  ? 01/27/22 0840  ?BP: 121/76  ?Pulse: 87  ?Resp: 20  ?SpO2: 99%  ?Weight: 288 lb 7 oz (130.8 kg)  ?Height: '6\' 5"'$  (1.956 m)  ? ?Wt Readings from Last 3 Encounters:  ?01/27/22 288 lb 7 oz (130.8 kg)  ?12/17/21 297 lb (134.7 kg)  ?07/21/21 (!) 311 lb 2 oz (141.1 kg)  ? ?Lab Results  ?Component Value Date  ? CREATININE 1.19 08/28/2021  ? CREATININE 1.45 (H) 07/21/2021  ? CREATININE 1.18 04/01/2021  ? ? ?Physical Exam ?Vitals and nursing note reviewed.  ?Constitutional:   ?   General: He is not in acute distress. ?   Appearance: Normal appearance. He is not ill-appearing.  ?HENT:  ?   Head: Normocephalic and atraumatic.  ?Neck:  ?   Comments: No JVD ?Cardiovascular:  ?   Rate and Rhythm: Normal rate and regular rhythm.  ?   Pulses: Normal pulses.  ?   Heart sounds: Normal heart sounds. No murmur heard. ?  No gallop.  ?Pulmonary:  ?   Effort: Pulmonary effort is normal.  ?   Breath sounds: No wheezing, rhonchi or rales.  ?Abdominal:  ?   General: There is no distension.  ?   Palpations: Abdomen is soft.  ?   Tenderness: There is no abdominal tenderness.  ?Musculoskeletal:     ?   General: No tenderness.  ?   Cervical back: Normal range of motion and neck supple.  ?   Right lower leg: Edema (trace) present.  ?   Left lower leg: Edema (trace) present.  ?Skin: ?   General: Skin is warm and dry.  ?Neurological:  ?   General: No focal deficit present.  ?   Mental Status: He is alert and oriented to person, place, and time.  ?Psychiatric:     ?   Mood and Affect: Mood normal.     ?   Behavior: Behavior normal.     ?   Thought Content: Thought content normal.  ? ? ?Assessment & Plan: ? ?1: Chronic heart  failure with reduced ejection fraction- ?- NYHA class I ?- euvolemic today ?- weighing daily; reminded to call for an overnight weight gain of >2 pounds or a weekly weight gain of >5 pounds ?- weight down 11 lbs from last visit five weeks ago ?- has been increasing his activity along with monitoring portion sizes ?- not adding salt and is trying to limit his sodium intake ?- saw cardiology [name]) 01/02/22 ?- participating in the paramedicine program  ?- on GDMT of entresto, farxiga & metoprolol  ?- inadvertently stopped taking his spironolactone, he got confused with what to stop at start of farxiga ?- check BMP today, if ok will restart his spironolactone ?- sent new prescription in for spironolactone  with instructions to not take until we call with lab results ?- lasix PRN since start of farxiga, he has not taken at all and has not needed ?- BNP 07/10/2019 was 968.0 ?- PharmD reconciled medications with the patient ? ?2: HTN- ?- BP 121/76 ?- saw PCP [name]) 07/31/21 ?- BMP 08/28/21 reviewed and showed sodium 138, potassium 4.0, creatinine 1.19 and GFR >60 ?- will check today  ? ?3: DM- ?- A1c 05/29/21 7.4% ?- checks BS daily, today was 114 ? ? ?Did not bring his medications, but reviewed with PharmD.  ? ?Return in 1 month, sooner if needed.  ? ? ? ? ? ? ? ? ? ? ? ? ?   ? ? ? ? ?  ? ?

## 2022-01-27 NOTE — Progress Notes (Signed)
Gary Bentley COUNSELING NOTE ? ?Guideline-Directed Medical Therapy/Evidence Based Medicine ? ?ACE/ARB/ARNI: Sacubitril-valsartan 49-51 mg twice daily ?Beta Blocker: Metoprolol succinate 50 mg daily ?Aldosterone Antagonist: Spironolactone 12.5 mg daily ?Diuretic: Furosemide 40 mg BID PRN ?SGLT2i: Dapagliflozin 10 mg daily ? ?Adherence Assessment ? ?Do you ever forget to take your medication? '[]'$ Yes ?'[x]'$ No  ?Do you ever skip doses due to side effects? '[]'$ Yes ?'[x]'$ No  ?Do you have trouble affording your medicines? '[]'$ Yes ?'[x]'$ No  ?Are you ever unable to pick up your medication due to transportation difficulties? '[]'$ Yes ?'[x]'$ No  ?Do you ever stop taking your medications because you don't believe they are helping? '[]'$ Yes ?'[x]'$ No  ?Do you check your weight daily? '[x]'$ Yes ?'[]'$ No  ? ?Adherence strategy: none ? ?Barriers to obtaining medications: consider medication expensive but able to afford ? ?Vital signs: HR 87, BP 121/76, weight (pounds) 288 lbs ?ECHO: Date 06/04/21, EF 30%, moderate LV systolic dysfunction with mild LVH, mild valvular regurgitation  ? ?BMP Latest Ref Rng & Units 08/28/2021 07/21/2021 04/01/2021  ?Glucose 70 - 99 mg/dL 180(H) 106(H) 127(H)  ?BUN 6 - 20 mg/dL 14 18 21(H)  ?Creatinine 0.61 - 1.24 mg/dL 1.19 1.45(H) 1.18  ?Sodium 135 - 145 mmol/L 138 137 139  ?Potassium 3.5 - 5.1 mmol/L 4.0 4.1 3.8  ?Chloride 98 - 111 mmol/L 107 103 109  ?CO2 22 - 32 mmol/L '24 25 22  '$ ?Calcium 8.9 - 10.3 mg/dL 8.7(L) 9.2 9.2  ? ? ?Past Medical History:  ?Diagnosis Date  ? CHF (congestive heart failure) ([place])   ? Diabetes mellitus without complication ([place])   ? Hypertension   ? ? ?ASSESSMENT ?51 year old male who presents to the HF clinic for follow up. PMH includes hypertension, DM, and HFrEF. Patient denies dizziness, swelling, increase fatigue, or ADR with medication. Noted no ED visits in the last 6 months.  ? ?During medrec noted patient stopped taking spironolactone after last  OV due to confusion. All other medication use as prescribed. ? ?PLAN ? ?Recommend to resume low dose spironolactone for cardiovascular benefits ?Continue all other medication as previously prescribed, including furosemide ONLY as needed. ? ? ?Time spent: 15 minutes ? ?Gary Bentley PharmD, BCPS ?01/27/2022 9:23 AM ? ? ?Current Outpatient Medications:  ?  Cysteamine Bitartrate (PROCYSBI) 300 MG PACK, Use 1 each 3 (three) times daily Use as instructed. ONE TOUCH DELICA LANCETS V76.1, Disp: , Rfl:  ?  dapagliflozin propanediol (FARXIGA) 10 MG TABS tablet, Take 1 tablet (10 mg total) by mouth daily before breakfast., Disp: 30 tablet, Rfl: 5 ?  furosemide (LASIX) 40 MG tablet, Take 1 tablet (40 mg total) by mouth 2 (two) times daily. (Patient taking differently: Take 40 mg by mouth as needed.), Disp: 180 tablet, Rfl: 3 ?  insulin aspart protamine - aspart (NOVOLOG MIX 70/30 FLEXPEN) (70-30) 100 UNIT/ML FlexPen, Inject 0.1 mLs (10 Units total) into the skin 2 (two) times daily. (Patient taking differently: Inject 30 Units into the skin 2 (two) times daily.), Disp: 15 mL, Rfl: 11 ?  isosorbide mononitrate (IMDUR) 30 MG 24 hr tablet, Take 1 tablet by mouth once daily, Disp: 90 tablet, Rfl: 3 ?  metoprolol succinate (TOPROL-XL) 50 MG 24 hr tablet, Take 1 tablet (50 mg total) by mouth daily. Take with or immediately following a meal., Disp: 90 tablet, Rfl: 3 ?  potassium chloride SA (KLOR-CON M) 20 MEQ tablet, Take by mouth as needed. With lasix, Disp: , Rfl:  ?  sacubitril-valsartan (ENTRESTO)  49-51 MG, Take 1 tablet by mouth 2 (two) times daily., Disp: 60 tablet, Rfl: 5 ?  spironolactone (ALDACTONE) 25 MG tablet, Take 0.5 tablets (12.5 mg total) by mouth daily., Disp: 45 tablet, Rfl: 3 ? ?MEDICATION ADHERENCES TIPS AND STRATEGIES ?Taking medication as prescribed improves patient outcomes in heart failure (reduces hospitalizations, improves symptoms, increases survival) ?Side effects of medications can be managed by  decreasing doses, switching agents, stopping drugs, or adding additional therapy. Please let someone in the Day Clinic know if you have having bothersome side effects so we can modify your regimen. Do not alter your medication regimen without talking to [place].  ?Medication reminders can help patients remember to take drugs on time. If you are missing or forgetting doses you can try linking behaviors, using pill boxes, or an electronic reminder like an alarm on your phone or an app. Some people can also get automated phone calls as medication reminders.  ? ?

## 2022-01-27 NOTE — Telephone Encounter (Signed)
Agree with NP student assessment of BMP results obtained earlier today. Resume spironolactone 12.'5mg'$  daily per previous conversation. Will check labs again in 1 week.  ?

## 2022-02-03 ENCOUNTER — Other Ambulatory Visit: Payer: Self-pay

## 2022-02-03 ENCOUNTER — Other Ambulatory Visit
Admission: RE | Admit: 2022-02-03 | Discharge: 2022-02-03 | Disposition: A | Discharge status: Home / Self Care | Payer: BC Managed Care – PPO | Source: Ambulatory Visit | Attending: Family | Admitting: Family

## 2022-02-03 DIAGNOSIS — I5022 Chronic systolic (congestive) heart failure: Secondary | ICD-10-CM | POA: Insufficient documentation

## 2022-02-03 LAB — BASIC METABOLIC PANEL
Anion gap: 8 (ref 5–15)
BUN: 18 mg/dL (ref 6–20)
CO2: 25 mmol/L (ref 22–32)
Calcium: 9.4 mg/dL (ref 8.9–10.3)
Chloride: 101 mmol/L (ref 98–111)
Creatinine, Ser: 1.18 mg/dL (ref 0.61–1.24)
GFR, Estimated: >60 mL/min (ref >60)
Glucose, Bld: 315 mg/dL — ABNORMAL HIGH (ref 70–99)
Potassium: 3.9 mmol/L (ref 3.5–5.1)
Sodium: 134 mmol/L — ABNORMAL LOW (ref 135–145)

## 2022-02-24 ENCOUNTER — Telehealth: Payer: Self-pay | Admitting: Family

## 2022-02-24 ENCOUNTER — Ambulatory Visit: Payer: BC Managed Care – PPO | Admitting: Family

## 2022-02-24 NOTE — Progress Notes (Deleted)
? Patient ID: Gary Bentley, male    DOB: 05/18/1971, 51 y.o.   MRN: [id number] ? ? ?Gary Bentley is a 51 y/o male with a history of DM, HTN, previous tobacco use and chronic heart failure.  ? ?Echo report from 06/04/21 revealed moderate LV systolic dysfunction with mild LVH, mild valvular regurgitation and estimated EF of 30% which remains unchanged since his previous echocardiogram in 2021. Echo report from 01/10/2019 reviewed and showed an EF of 15-20% along with trivial AR and mild pulmonic stenosis.  ? ?Catheterization done 01/13/2019 showed: ?Nonischemic cardiomyopathy, no significant CAD ?Mildly elevated wedge pressure,  ?EF severely depressed, global Hypokinesis, EF 20% ? ?Has not been admitted or been in the ED in the last 6 months.  ? ?He presents today for a follow-up visit with a chief complaint of  ? ?Past Medical History:  ?Diagnosis Date  ? CHF (congestive heart failure) ([place])   ? Diabetes mellitus without complication ([place])   ? Hypertension   ? ?Past Surgical History:  ?Procedure Laterality Date  ? AMPUTATION TOE Left 01/05/2020  ? Procedure: AMPUTATION TOE MPJ LEFT;  Surgeon: Gary Bentley, DPM;  Location: [facility];  Service: Podiatry;  Laterality: Left;  ? FOOT SURGERY    ? RIGHT/LEFT HEART CATH AND CORONARY ANGIOGRAPHY N/A 01/13/2019  ? Procedure: RIGHT/LEFT HEART CATH AND CORONARY ANGIOGRAPHY;  Surgeon: Gary Merritts, MD;  Location: [place];  Service: Cardiovascular;  Laterality: N/A;  ? ?Family History  ?Family history unknown: Yes  ? ?Social History  ? ?Tobacco Use  ? Smoking status: Former  ?  Packs/day: 1.00  ?  Types: Cigarettes  ?  Quit date: 04/17/2017  ?  Years since quitting: 4.8  ? Smokeless tobacco: Never  ?Substance Use Topics  ? Alcohol use: No  ? ?Allergies  ?Allergen Reactions  ? Bee Venom Anaphylaxis  ? ? ?Review of Systems  ?Constitutional:  Negative for appetite change, diaphoresis and fatigue.  ?HENT:  Negative for congestion, postnasal drip and sore throat.    ?Eyes: Negative.   ?Respiratory:  Negative for cough, chest tightness and shortness of breath.   ?Cardiovascular:  Negative for chest pain, palpitations and leg swelling.  ?Gastrointestinal:  Negative for abdominal distention and abdominal pain.  ?Endocrine: Negative.   ?Genitourinary: Negative.   ?Musculoskeletal:  Negative for arthralgias, back pain and neck pain.  ?Skin: Negative.   ?Allergic/Immunologic: Negative.   ?Neurological:  Negative for dizziness, syncope and light-headedness.  ?Hematological:  Negative for adenopathy. Does not bruise/bleed easily.  ?Psychiatric/Behavioral:  Negative for dysphoric mood and sleep disturbance (sleeping on 2 pillows). The patient is not nervous/anxious.   ? ? ? ? ?Physical Exam ?Vitals and nursing note reviewed.  ?Constitutional:   ?   General: He is not in acute distress. ?   Appearance: Normal appearance. He is not ill-appearing.  ?HENT:  ?   Head: Normocephalic and atraumatic.  ?Neck:  ?   Comments: No JVD ?Cardiovascular:  ?   Rate and Rhythm: Normal rate and regular rhythm.  ?   Pulses: Normal pulses.  ?   Heart sounds: Normal heart sounds. No murmur heard. ?  No gallop.  ?Pulmonary:  ?   Effort: Pulmonary effort is normal.  ?   Breath sounds: No wheezing, rhonchi or rales.  ?Abdominal:  ?   General: There is no distension.  ?   Palpations: Abdomen is soft.  ?   Tenderness: There is no abdominal tenderness.  ?Musculoskeletal:     ?  General: No tenderness.  ?   Cervical back: Normal range of motion and neck supple.  ?   Right lower leg: Edema (trace) present.  ?   Left lower leg: Edema (trace) present.  ?Skin: ?   General: Skin is warm and dry.  ?Neurological:  ?   General: No focal deficit present.  ?   Mental Status: He is alert and oriented to person, place, and time.  ?Psychiatric:     ?   Mood and Affect: Mood normal.     ?   Behavior: Behavior normal.     ?   Thought Content: Thought content normal.  ? ? ?Assessment & Plan: ? ?1: Chronic heart failure with  reduced ejection fraction- ?- NYHA class I ?- euvolemic today ?- weighing daily; reminded to call for an overnight weight gain of >2 pounds or a weekly weight gain of >5 pounds ?- weight 288.7 lbs from last visit 1 month ago ?- has been increasing his activity along with monitoring portion sizes ?- not adding salt and is trying to limit his sodium intake ?- saw cardiology Gary Bentley) 01/02/22 ?- participating in the paramedicine program  ?- on GDMT of entresto, farxiga, spironolactone & metoprolol  ?- check BMP today since he's been on spironolactone for ~ 1 month ?- lasix PRN  ?- BNP 07/10/2019 was 968.0 ? ? ?2: HTN- ?- BP  ?- saw PCP Gary Bentley) 07/31/21 ?- BMP 02/03/22 reviewed and showed sodium 134, potassium 3.9, creatinine 1.18 and GFR >60 ? ? ?3: DM- ?- A1c 05/29/21 7.4% ?- checks BS daily, today was  ? ? ? ? ? ? ? ? ? ? ? ? ? ? ? ?   ? ? ? ? ?  ? ?

## 2022-02-24 NOTE — Telephone Encounter (Signed)
Patient did not show for his Heart Failure Clinic appointment on 02/24/22. This is the 8th appointment he has missed.  ? Will attempt to reschedule.   ?

## 2022-03-19 ENCOUNTER — Other Ambulatory Visit (HOSPITAL_COMMUNITY): Payer: Self-pay

## 2022-03-19 NOTE — Progress Notes (Signed)
Jeran states he is doing ok, he has no needs today.  He has not been able to show for his hf clinic appts lately due to being busy at work.  Have not rescheduled him again, will check back with him.  He states has all his medications.  Will continue to visit for heart failure, diet and medication management. ? ?Krsiti Debraann Livingstone ?[place] EMT-Paramedic ?[phone] ?

## 2022-03-30 ENCOUNTER — Ambulatory Visit: Payer: BC Managed Care – PPO | Attending: Family | Admitting: Family

## 2022-03-30 ENCOUNTER — Encounter: Payer: Self-pay | Admitting: Family

## 2022-03-30 VITALS — BP 110/63 | HR 96 | Resp 20 | Ht 77.0 in | Wt 288.2 lb

## 2022-03-30 DIAGNOSIS — Z87891 Personal history of nicotine dependence: Secondary | ICD-10-CM | POA: Insufficient documentation

## 2022-03-30 DIAGNOSIS — I5022 Chronic systolic (congestive) heart failure: Secondary | ICD-10-CM | POA: Diagnosis not present

## 2022-03-30 DIAGNOSIS — I110 Hypertensive heart disease with heart failure: Secondary | ICD-10-CM | POA: Insufficient documentation

## 2022-03-30 DIAGNOSIS — Z79899 Other long term (current) drug therapy: Secondary | ICD-10-CM | POA: Diagnosis not present

## 2022-03-30 DIAGNOSIS — E119 Type 2 diabetes mellitus without complications: Secondary | ICD-10-CM | POA: Insufficient documentation

## 2022-03-30 DIAGNOSIS — I10 Essential (primary) hypertension: Secondary | ICD-10-CM

## 2022-03-30 DIAGNOSIS — Z794 Long term (current) use of insulin: Secondary | ICD-10-CM | POA: Insufficient documentation

## 2022-03-30 LAB — GLUCOSE, CAPILLARY: Glucose-Capillary: 491 mg/dL — ABNORMAL HIGH (ref 70–99)

## 2022-03-30 MED ORDER — DAPAGLIFLOZIN PROPANEDIOL 10 MG PO TABS: 10.0000 mg | ORAL_TABLET | Freq: Every day | ORAL | 3 refills | Status: DC

## 2022-03-30 MED ORDER — SACUBITRIL-VALSARTAN 49-51 MG PO TABS: 1.0000 | ORAL_TABLET | Freq: Two times a day (BID) | ORAL | 3 refills | Status: DC

## 2022-03-30 MED ORDER — SPIRONOLACTONE 25 MG PO TABS: 12.5000 mg | ORAL_TABLET | Freq: Every day | ORAL | 3 refills | Status: DC

## 2022-03-30 MED ORDER — METOPROLOL SUCCINATE ER 50 MG PO TB24: 50.0000 mg | ORAL_TABLET | Freq: Every day | ORAL | 3 refills | Status: DC

## 2022-03-30 MED ORDER — ISOSORBIDE MONONITRATE ER 30 MG PO TB24: 30.0000 mg | ORAL_TABLET | Freq: Every day | ORAL | 3 refills | Status: DC

## 2022-03-30 NOTE — Patient Instructions (Addendum)
Continue weighing daily and call for an overnight weight gain of 3 pounds or more or a weekly weight gain of more than 5 pounds. ? ? ?If you have voicemail, please make sure your mailbox is cleaned out so that we may leave a message and please make sure to listen to any voicemails.  ? ? ?Bring ALL medications including any over the counter or vitamins to every visit.  ? ? ?

## 2022-03-30 NOTE — Progress Notes (Signed)
? Patient ID: Gary Bentley, male    DOB: 1971/06/29, 51 y.o.   MRN: [id number] ? ? ?Gary Bentley is a 51 y/o male with a history of DM, HTN, previous tobacco use and chronic heart failure.  ? ?Echo report from 06/04/21 revealed moderate LV systolic dysfunction with mild LVH, mild valvular regurgitation and estimated EF of 30% which remains unchanged since his previous echocardiogram in 2021. Echo report from 01/10/2019 reviewed and showed an EF of 15-20% along with trivial AR and mild pulmonic stenosis.  ? ?Catheterization done 01/13/2019 showed: ?Nonischemic cardiomyopathy, no significant CAD ?Mildly elevated wedge pressure,  ?EF severely depressed, global Hypokinesis, EF 20% ? ?Has not been admitted or been in the ED in the last 6 months.  ? ?He presents today for a follow-up visit of his heart failure. He currently denies any symptoms and specifically denies any difficulty sleeping, dizziness, abdominal distention, palpitations, pedal edema, chest pain, shortness of breath, cough, fatigue or weight gain.  ? ?Hasn't been checking his glucose as he doesn't have a glucometer anymore. Did not take his insulin this morning.  ? ?Ate chinese food for lunch today prior to coming to his appointment. Also drank coke zero and OJ earlier today.  ? ?Past Medical History:  ?Diagnosis Date  ? CHF (congestive heart failure) ([place])   ? Diabetes mellitus without complication ([place])   ? Hypertension   ? ?Past Surgical History:  ?Procedure Laterality Date  ? AMPUTATION TOE Left 01/05/2020  ? Procedure: AMPUTATION TOE MPJ LEFT;  Surgeon: Samara Deist, DPM;  Location: [facility];  Service: Podiatry;  Laterality: Left;  ? FOOT SURGERY    ? RIGHT/LEFT HEART CATH AND CORONARY ANGIOGRAPHY N/A 01/13/2019  ? Procedure: RIGHT/LEFT HEART CATH AND CORONARY ANGIOGRAPHY;  Surgeon: Minna Merritts, MD;  Location: [facility];  Service: Cardiovascular;  Laterality: N/A;  ? ?Family History  ?Family history unknown: Yes  ? ?Social History   ? ?Tobacco Use  ? Smoking status: Former  ?  Packs/day: 1.00  ?  Types: Cigarettes  ?  Quit date: 04/17/2017  ?  Years since quitting: 4.9  ? Smokeless tobacco: Never  ?Substance Use Topics  ? Alcohol use: No  ? ?Allergies  ?Allergen Reactions  ? Bee Venom Anaphylaxis  ? ?Prior to Admission medications   ?Medication Sig Start Date End Date Taking? Authorizing Provider  ?Cysteamine Bitartrate (PROCYSBI) 300 MG PACK Use 1 each 3 (three) times daily Use as instructed. ONE TOUCH DELICA LANCETS W73.7 10/62/69  Yes [provider]  ?furosemide (LASIX) 40 MG tablet Take 1 tablet (40 mg total) by mouth 2 (two) times daily. ?Patient taking differently: Take 40 mg by mouth as needed. 04/01/21 04/01/22 Yes Alisa Graff, FNP  ?insulin aspart protamine - aspart (NOVOLOG MIX 70/30 FLEXPEN) (70-30) 100 UNIT/ML FlexPen Inject 0.1 mLs (10 Units total) into the skin 2 (two) times daily. ?Patient taking differently: Inject 30 Units into the skin 2 (two) times daily. 01/15/19  Yes Dustin Flock, MD  ?potassium chloride SA (KLOR-CON M) 20 MEQ tablet Take by mouth as needed. With lasix 01/02/22  Yes [provider]  ?dapagliflozin propanediol (FARXIGA) 10 MG TABS tablet Take 1 tablet (10 mg total) by mouth daily before breakfast. 03/30/22   Alisa Graff, FNP  ?isosorbide mononitrate (IMDUR) 30 MG 24 hr tablet Take 1 tablet (30 mg total) by mouth daily. 03/30/22   Alisa Graff, FNP  ?metoprolol succinate (TOPROL-XL) 50 MG 24 hr tablet Take 1 tablet (  50 mg total) by mouth daily. Take with or immediately following a meal. 03/30/22   Alisa Graff, FNP  ?sacubitril-valsartan (ENTRESTO) 49-51 MG Take 1 tablet by mouth 2 (two) times daily. 03/30/22   Alisa Graff, FNP  ?spironolactone (ALDACTONE) 25 MG tablet Take 0.5 tablets (12.5 mg total) by mouth daily. 03/30/22   Alisa Graff, FNP  ? ? ?Review of Systems  ?Constitutional:  Negative for appetite change and fatigue.  ?HENT:  Negative for congestion, postnasal drip and  sore throat.   ?Eyes: Negative.   ?Respiratory:  Negative for cough, chest tightness and shortness of breath.   ?Cardiovascular:  Negative for chest pain, palpitations and leg swelling.  ?Gastrointestinal:  Negative for abdominal distention and abdominal pain.  ?Endocrine: Negative.   ?Genitourinary: Negative.   ?Musculoskeletal:  Negative for arthralgias, back pain and neck pain.  ?Skin: Negative.   ?Allergic/Immunologic: Negative.   ?Neurological:  Negative for dizziness, syncope and light-headedness.  ?Hematological:  Negative for adenopathy. Does not bruise/bleed easily.  ?Psychiatric/Behavioral:  Negative for dysphoric mood and sleep disturbance (sleeping on 2 pillows). The patient is not nervous/anxious.   ? ?Vitals:  ? 03/30/22 1437  ?BP: 110/63  ?Pulse: 96  ?Resp: 20  ?SpO2: 99%  ?Weight: 288 lb 4 oz (130.7 kg)  ?Height: '6\' 5"'$  (1.956 m)  ? ?Wt Readings from Last 3 Encounters:  ?03/30/22 288 lb 4 oz (130.7 kg)  ?01/27/22 288 lb 7 oz (130.8 kg)  ?12/17/21 297 lb (134.7 kg)  ? ?Lab Results  ?Component Value Date  ? CREATININE 1.18 02/03/2022  ? CREATININE 1.15 01/27/2022  ? CREATININE 1.19 08/28/2021  ? ?Physical Exam ?Vitals and nursing note reviewed.  ?Constitutional:   ?   General: He is not in acute distress. ?   Appearance: Normal appearance. He is not ill-appearing.  ?HENT:  ?   Head: Normocephalic and atraumatic.  ?Neck:  ?   Comments: No JVD ?Cardiovascular:  ?   Rate and Rhythm: Normal rate and regular rhythm.  ?   Pulses: Normal pulses.  ?   Heart sounds: Normal heart sounds. No murmur heard. ?  No gallop.  ?Pulmonary:  ?   Effort: Pulmonary effort is normal.  ?   Breath sounds: No wheezing, rhonchi or rales.  ?Abdominal:  ?   General: There is no distension.  ?   Palpations: Abdomen is soft.  ?   Tenderness: There is no abdominal tenderness.  ?Musculoskeletal:     ?   General: No tenderness.  ?   Cervical back: Normal range of motion and neck supple.  ?   Right lower leg: Edema (trace) present.  ?    Left lower leg: Edema (trace) present.  ?Skin: ?   General: Skin is warm and dry.  ?Neurological:  ?   General: No focal deficit present.  ?   Mental Status: He is alert and oriented to person, place, and time.  ?Psychiatric:     ?   Mood and Affect: Mood normal.     ?   Behavior: Behavior normal.     ?   Thought Content: Thought content normal.  ? ? ?Assessment & Plan: ? ?1: Chronic heart failure with reduced ejection fraction- ?- NYHA class I ?- euvolemic today ?- weighing daily; reminded to call for an overnight weight gain of >2 pounds or a weekly weight gain of >5 pounds ?- weight stable from last visit here 2 months ago ?- has been increasing his activity  along with monitoring portion sizes ?- not adding salt and is trying to limit his sodium intake although ate chinese food for lunch today; reviewed the high sodium content of chinese food ?- saw cardiology [name]) 01/02/22 ?- participating in the paramedicine program  ?- on GDMT of entresto, farxiga, spironolactone & metoprolol  ?- BNP 07/10/2019 was 968.0 ? ?2: HTN- ?- BP looks good (110/63) ?- saw PCP [name]) 07/31/21  ?- BMP done 02/03/22 and showed sodium 134, potassium 3.9, creatinine 1.18 and GFR >60 ? ?3: DM- ?- A1c 05/29/21 7.4% ?- did not take his insulin today and hasn't been checking his glucose as his meter is broke ?- nonfasting glucose in clinic today was 491 ?- gift card provided so that he can get a glucometer picked up from pharmacy; emphasized the importance of checking his glucose daily with taking insulin ? ? ?Patient did not bring his medications nor a list. Each medication was verbally reviewed with the patient and he was encouraged to bring the bottles to every visit to confirm accuracy of list.  ? ?Return in 2 months, sooner if needed.  ? ? ? ? ? ? ? ? ? ? ? ? ? ? ?   ? ? ? ? ?  ? ?

## 2022-03-31 ENCOUNTER — Encounter: Payer: Self-pay | Admitting: Family

## 2022-03-31 DIAGNOSIS — I509 Heart failure, unspecified: Secondary | ICD-10-CM | POA: Diagnosis not present

## 2022-03-31 DIAGNOSIS — Z794 Long term (current) use of insulin: Secondary | ICD-10-CM | POA: Diagnosis not present

## 2022-03-31 DIAGNOSIS — Z0001 Encounter for general adult medical examination with abnormal findings: Secondary | ICD-10-CM | POA: Diagnosis not present

## 2022-03-31 DIAGNOSIS — Z1331 Encounter for screening for depression: Secondary | ICD-10-CM | POA: Diagnosis not present

## 2022-03-31 DIAGNOSIS — E119 Type 2 diabetes mellitus without complications: Secondary | ICD-10-CM | POA: Diagnosis not present

## 2022-03-31 DIAGNOSIS — I10 Essential (primary) hypertension: Secondary | ICD-10-CM | POA: Diagnosis not present

## 2022-04-03 ENCOUNTER — Ambulatory Visit: Payer: BC Managed Care – PPO | Admitting: Family

## 2022-04-14 DIAGNOSIS — D2372 Other benign neoplasm of skin of left lower limb, including hip: Secondary | ICD-10-CM | POA: Diagnosis not present

## 2022-04-14 DIAGNOSIS — E1142 Type 2 diabetes mellitus with diabetic polyneuropathy: Secondary | ICD-10-CM | POA: Diagnosis not present

## 2022-04-14 DIAGNOSIS — B351 Tinea unguium: Secondary | ICD-10-CM | POA: Diagnosis not present

## 2022-04-14 DIAGNOSIS — Z89412 Acquired absence of left great toe: Secondary | ICD-10-CM | POA: Diagnosis not present

## 2022-05-28 ENCOUNTER — Ambulatory Visit: Payer: BC Managed Care – PPO | Admitting: Family

## 2022-05-28 NOTE — Progress Notes (Deleted)
Patient ID: Gary Bentley, male    DOB: 05/29/1971, 51 y.o.   MRN: [id number]   Gary Bentley is a 51 y/o male with a history of DM, HTN, previous tobacco use and chronic heart failure.   Echo report from 06/04/21 revealed moderate LV systolic dysfunction with mild LVH, mild valvular regurgitation and estimated EF of 30% which remains unchanged since his previous echocardiogram in 2021. Echo report from 01/10/2019 reviewed and showed an EF of 15-20% along with trivial AR and mild pulmonic stenosis.   Catheterization done 01/13/2019 showed: Nonischemic cardiomyopathy, no significant CAD Mildly elevated wedge pressure,  EF severely depressed, global Hypokinesis, EF 20%  Has not been admitted or been in the ED in the last 6 months.   He presents today for a follow-up visit of his heart failure.  Past Medical History:  Diagnosis Date   CHF (congestive heart failure) ([place])    Diabetes mellitus without complication ([place])    Hypertension    Past Surgical History:  Procedure Laterality Date   AMPUTATION TOE Left 01/05/2020   Procedure: AMPUTATION TOE MPJ LEFT;  Surgeon: Samara Deist, DPM;  Location: [facility];  Service: Podiatry;  Laterality: Left;   FOOT SURGERY     RIGHT/LEFT HEART CATH AND CORONARY ANGIOGRAPHY N/A 01/13/2019   Procedure: RIGHT/LEFT HEART CATH AND CORONARY ANGIOGRAPHY;  Surgeon: Minna Merritts, MD;  Location: [place] [facility];  Service: Cardiovascular;  Laterality: N/A;   Family History  Family history unknown: Yes   Social History   Tobacco Use   Smoking status: Former    Packs/day: 1.00    Types: Cigarettes    Quit date: 04/17/2017    Years since quitting: 5.1   Smokeless tobacco: Never  Substance Use Topics   Alcohol use: No   Allergies  Allergen Reactions   Bee Venom Anaphylaxis     Review of Systems  Constitutional:  Negative for appetite change and fatigue.  HENT:  Negative for congestion, postnasal drip and sore throat.   Eyes: Negative.    Respiratory:  Negative for cough, chest tightness and shortness of breath.   Cardiovascular:  Negative for chest pain, palpitations and leg swelling.  Gastrointestinal:  Negative for abdominal distention and abdominal pain.  Endocrine: Negative.   Genitourinary: Negative.   Musculoskeletal:  Negative for arthralgias, back pain and neck pain.  Skin: Negative.   Allergic/Immunologic: Negative.   Neurological:  Negative for dizziness, syncope and light-headedness.  Hematological:  Negative for adenopathy. Does not bruise/bleed easily.  Psychiatric/Behavioral:  Negative for dysphoric mood and sleep disturbance (sleeping on 2 pillows). The patient is not nervous/anxious.       Physical Exam Vitals and nursing note reviewed.  Constitutional:      General: He is not in acute distress.    Appearance: Normal appearance. He is not ill-appearing.  HENT:     Head: Normocephalic and atraumatic.  Neck:     Comments: No JVD Cardiovascular:     Rate and Rhythm: Normal rate and regular rhythm.     Pulses: Normal pulses.     Heart sounds: Normal heart sounds. No murmur heard.    No gallop.  Pulmonary:     Effort: Pulmonary effort is normal.     Breath sounds: No wheezing, rhonchi or rales.  Abdominal:     General: There is no distension.     Palpations: Abdomen is soft.     Tenderness: There is no abdominal tenderness.  Musculoskeletal:  General: No tenderness.     Cervical back: Normal range of motion and neck supple.     Right lower leg: Edema (trace) present.     Left lower leg: Edema (trace) present.  Skin:    General: Skin is warm and dry.  Neurological:     General: No focal deficit present.     Mental Status: He is alert and oriented to person, place, and time.  Psychiatric:        Mood and Affect: Mood normal.        Behavior: Behavior normal.        Thought Content: Thought content normal.     Assessment & Plan:  1: Chronic heart failure with reduced ejection  fraction- - NYHA class I - euvolemic today - weighing daily; reminded to call for an overnight weight gain of >2 pounds or a weekly weight gain of >5 pounds - weight 288.4 from last visit here 2 months ago - has been increasing his activity along with monitoring portion sizes - not adding salt and is trying to limit his sodium intake  - saw cardiology [name]) 01/02/22 - participating in the paramedicine program  - on GDMT of entresto, farxiga, spironolactone & metoprolol  - BNP 07/10/2019 was 968.0  2: HTN- - BP - saw PCP [name]) 03/31/22 - BMP done 03/31/22 and showed sodium 139, potassium 3.8, creatinine 1.5 and GFR 61  3: DM- - A1c 03/31/22 was elevated at 11.6% - did not take his insulin today and hasn't been checking his glucose as his meter is broke - nonfasting glucose in clinic today was  - gift card provided so that he can get a glucometer picked up from pharmacy; emphasized the importance of checking his glucose daily with taking insulin   Patient did not bring his medications nor a list. Each medication was verbally reviewed with the patient and he was encouraged to bring the bottles to every visit to confirm accuracy of list.

## 2022-06-17 ENCOUNTER — Ambulatory Visit: Payer: BC Managed Care – PPO | Admitting: Family

## 2022-06-17 ENCOUNTER — Other Ambulatory Visit (HOSPITAL_COMMUNITY): Payer: Self-pay

## 2022-06-17 NOTE — Progress Notes (Signed)
Contacted Gary Bentley several times in past week reminding him of appt with Heart Failure clinic today.  He stated he was going but today text me advising he needs to reschedule, he can not make the appt.  Contacted heart failure clinic and advised and they rescheduled it for him.  Asked if he was ok, he states yes.   He states ok for next appt.  Will continue to remind him of next appt.   [name] [phone]

## 2022-07-15 ENCOUNTER — Ambulatory Visit: Payer: BC Managed Care – PPO | Admitting: Family

## 2022-08-06 ENCOUNTER — Ambulatory Visit: Payer: BC Managed Care – PPO | Attending: Family | Admitting: Family

## 2022-08-06 ENCOUNTER — Encounter: Payer: Self-pay | Admitting: Family

## 2022-08-06 VITALS — BP 106/74 | HR 85 | Resp 14 | Ht 77.0 in | Wt 292.4 lb

## 2022-08-06 DIAGNOSIS — I10 Essential (primary) hypertension: Secondary | ICD-10-CM | POA: Diagnosis not present

## 2022-08-06 DIAGNOSIS — Z7984 Long term (current) use of oral hypoglycemic drugs: Secondary | ICD-10-CM | POA: Insufficient documentation

## 2022-08-06 DIAGNOSIS — E119 Type 2 diabetes mellitus without complications: Secondary | ICD-10-CM

## 2022-08-06 DIAGNOSIS — I5022 Chronic systolic (congestive) heart failure: Secondary | ICD-10-CM | POA: Diagnosis not present

## 2022-08-06 DIAGNOSIS — Z794 Long term (current) use of insulin: Secondary | ICD-10-CM

## 2022-08-06 DIAGNOSIS — Z87891 Personal history of nicotine dependence: Secondary | ICD-10-CM | POA: Diagnosis not present

## 2022-08-06 DIAGNOSIS — I110 Hypertensive heart disease with heart failure: Secondary | ICD-10-CM | POA: Insufficient documentation

## 2022-08-06 DIAGNOSIS — Z79899 Other long term (current) drug therapy: Secondary | ICD-10-CM | POA: Diagnosis not present

## 2022-08-06 LAB — GLUCOSE, CAPILLARY: Glucose-Capillary: 77 mg/dL (ref 70–99)

## 2022-08-06 NOTE — Progress Notes (Signed)
Patient ID: Gary Bentley, male    DOB: [birth year], 51 y.o.   MRN: [id number]   Gary Bentley is a 51 y/o male with a history of DM, HTN, previous tobacco use and chronic heart failure.   Echo report from 06/04/21 revealed moderate LV systolic dysfunction with mild LVH, mild valvular regurgitation and estimated EF of 30% which remains unchanged since his previous echocardiogram in 2021. Echo report from 01/10/2019 reviewed and showed an EF of 15-20% along with trivial AR and mild pulmonic stenosis.   Catheterization done 01/13/2019 showed: Nonischemic cardiomyopathy, no significant CAD Mildly elevated wedge pressure,  EF severely depressed, global Hypokinesis, EF 20%  Has not been admitted or been in the ED in the last 6 months.   He presents today for a follow-up visit of his heart failure. He currently denies any symptoms and specifically denies any difficulty sleeping, dizziness, abdominal distention, palpitations, pedal edema, chest pain, shortness of breath, cough, fatigue. Following a low-sodium diet but has been cheating. Hasn't been checking his glucose as he doesn't have a glucometer anymore.   Past Medical History:  Diagnosis Date  . CHF (congestive heart failure) ([place])   . Diabetes mellitus without complication ([place])   . Hypertension    Past Surgical History:  Procedure Laterality Date  . AMPUTATION TOE Left 01/05/2020   Procedure: AMPUTATION TOE MPJ LEFT;  Surgeon: Samara Deist, DPM;  Location: [facility];  Service: Podiatry;  Laterality: Left;  . FOOT SURGERY    . RIGHT/LEFT HEART CATH AND CORONARY ANGIOGRAPHY N/A 01/13/2019   Procedure: RIGHT/LEFT HEART CATH AND CORONARY ANGIOGRAPHY;  Surgeon: Minna Merritts, MD;  Location: [place] [facility];  Service: Cardiovascular;  Laterality: N/A;   Family History  Family history unknown: Yes   Social History   Tobacco Use  . Smoking status: Former    Packs/day: 1.00    Types: Cigarettes    Quit date: 04/17/2017    Years  since quitting: 5.3  . Smokeless tobacco: Never  Substance Use Topics  . Alcohol use: No   Allergies  Allergen Reactions  . Bee Venom Anaphylaxis   Prior to Admission medications   Medication Sig Start Date End Date Taking? Authorizing Provider  dapagliflozin propanediol (FARXIGA) 10 MG TABS tablet Take 1 tablet (10 mg total) by mouth daily before breakfast. 03/30/22  Yes Hackney, Tina A, FNP  insulin aspart protamine - aspart (NOVOLOG MIX 70/30 FLEXPEN) (70-30) 100 UNIT/ML FlexPen Inject 0.1 mLs (10 Units total) into the skin 2 (two) times daily. Patient taking differently: Inject 15 Units into the skin 2 (two) times daily. 01/15/19  Yes Dustin Flock, MD  isosorbide mononitrate (IMDUR) 30 MG 24 hr tablet Take 1 tablet (30 mg total) by mouth daily. 03/30/22  Yes Darylene Price A, FNP  metoprolol succinate (TOPROL-XL) 50 MG 24 hr tablet Take 1 tablet (50 mg total) by mouth daily. Take with or immediately following a meal. 03/30/22  Yes Hackney, Tina A, FNP  sacubitril-valsartan (ENTRESTO) 49-51 MG Take 1 tablet by mouth 2 (two) times daily. 03/30/22  Yes Hackney, Tina A, FNP  Cysteamine Bitartrate (PROCYSBI) 300 MG PACK Use 1 each 3 (three) times daily Use as instructed. ONE TOUCH DELICA LANCETS X83.3 Patient not taking: Reported on 08/06/2022 09/11/20   [provider]  furosemide (LASIX) 40 MG tablet Take 1 tablet (40 mg total) by mouth 2 (two) times daily. Patient not taking: Reported on 08/06/2022 04/01/21 04/01/22  Alisa Graff, FNP  potassium chloride SA (KLOR-CON  M) 20 MEQ tablet Take by mouth as needed. With lasix Patient not taking: Reported on 08/06/2022 01/02/22   [provider]  spironolactone (ALDACTONE) 25 MG tablet Take 0.5 tablets (12.5 mg total) by mouth daily. Patient not taking: Reported on 08/06/2022 03/30/22   Alisa Graff, FNP     Review of Systems  Constitutional:  Negative for appetite change and fatigue.  HENT:  Negative for congestion, postnasal drip  and sore throat.   Eyes: Negative.   Respiratory:  Negative for cough, chest tightness and shortness of breath.   Cardiovascular:  Negative for chest pain, palpitations and leg swelling.  Gastrointestinal:  Negative for abdominal distention and abdominal pain.  Endocrine: Negative.   Genitourinary: Negative.   Musculoskeletal:  Negative for arthralgias, back pain and neck pain.  Skin: Negative.   Allergic/Immunologic: Negative.   Neurological:  Negative for dizziness, syncope and light-headedness.  Hematological:  Negative for adenopathy. Does not bruise/bleed easily.  Psychiatric/Behavioral:  Negative for dysphoric mood and sleep disturbance (sleeping on 2 pillows). The patient is not nervous/anxious.     Vitals:   08/06/22 1159  BP: 106/74  Pulse: 85  Resp: 14  SpO2: 99%  Weight: 292 lb 6 oz (132.6 kg)  Height: '6\' 5"'$  (1.956 m)   Wt Readings from Last 3 Encounters:  08/06/22 292 lb 6 oz (132.6 kg)  03/30/22 288 lb 4 oz (130.7 kg)  01/27/22 288 lb 7 oz (130.8 kg)   Lab Results  Component Value Date   CREATININE 1.18 02/03/2022   CREATININE 1.15 01/27/2022   CREATININE 1.19 08/28/2021   Physical Exam Vitals and nursing note reviewed.  Constitutional:      General: He is not in acute distress.    Appearance: Normal appearance. He is not ill-appearing.  HENT:     Head: Normocephalic and atraumatic.  Neck:     Comments: No JVD Cardiovascular:     Rate and Rhythm: Normal rate and regular rhythm.     Pulses: Normal pulses.     Heart sounds: Normal heart sounds. No murmur heard.    No gallop.  Pulmonary:     Effort: Pulmonary effort is normal.     Breath sounds: No wheezing, rhonchi or rales.  Abdominal:     General: There is no distension.     Palpations: Abdomen is soft.     Tenderness: There is no abdominal tenderness.  Musculoskeletal:        General: No tenderness.     Cervical back: Normal range of motion and neck supple.     Right lower leg: No edema  (trace).     Left lower leg: No edema (trace).  Skin:    General: Skin is warm and dry.  Neurological:     General: No focal deficit present.     Mental Status: He is alert and oriented to person, place, and time.  Psychiatric:        Mood and Affect: Mood normal.        Behavior: Behavior normal.        Thought Content: Thought content normal.    Assessment & Plan:  1: Chronic heart failure with reduced ejection fraction- - NYHA class I - euvolemic today - weighing daily; reminded to call for an overnight weight gain of >2 pounds or a weekly weight gain of >5 pounds - weight stable up 4 pounds since last visit here 4 months ago - has been increasing his activity along with monitoring portion  sizes - not adding salt and is trying to limit his sodium intake although ate chinese food for lunch today; reviewed the high sodium content of chinese food - saw cardiology [name]) 01/02/22 - participating in the paramedicine program  - on GDMT of entresto, farxiga, spironolactone & metoprolol  - BNP 07/10/2019 was 968.0  2: HTN- - BP looks good (106/74) - saw PCP [name]) 03/31/22 - BMP done 02/03/22 and showed sodium 134, potassium 3.9, creatinine 1.18 and GFR >60  3: DM- - A1c 05/29/21 7.4% - did not take his insulin today and hasn't been checking his glucose as his meter is broke - nonfasting glucose in clinic today was 491 - gift card provided so that he can get a glucometer picked up from pharmacy; emphasized the importance of checking his glucose daily with taking insulin   Each medication was verbally reviewed with the patient and he was encouraged to bring the bottles to every visit to confirm accuracy of list.   Return in 3 months, sooner if needed.

## 2022-08-06 NOTE — Patient Instructions (Addendum)
Continue weighing daily and call for an overnight weight gain of 3 pounds or more or a weekly weight gain of more than 5 pounds.  Call pharmacy and pick up Spironolactone.

## 2022-08-07 ENCOUNTER — Encounter: Payer: Self-pay | Admitting: Family

## 2022-09-15 DIAGNOSIS — L910 Hypertrophic scar: Secondary | ICD-10-CM | POA: Diagnosis not present

## 2022-10-01 DIAGNOSIS — I10 Essential (primary) hypertension: Secondary | ICD-10-CM | POA: Diagnosis not present

## 2022-10-01 DIAGNOSIS — E119 Type 2 diabetes mellitus without complications: Secondary | ICD-10-CM | POA: Diagnosis not present

## 2022-10-01 DIAGNOSIS — I5020 Unspecified systolic (congestive) heart failure: Secondary | ICD-10-CM | POA: Diagnosis not present

## 2022-10-01 DIAGNOSIS — F411 Generalized anxiety disorder: Secondary | ICD-10-CM | POA: Diagnosis not present

## 2022-10-01 DIAGNOSIS — Z794 Long term (current) use of insulin: Secondary | ICD-10-CM | POA: Diagnosis not present

## 2022-10-12 DIAGNOSIS — I10 Essential (primary) hypertension: Secondary | ICD-10-CM | POA: Diagnosis not present

## 2022-10-12 DIAGNOSIS — I5020 Unspecified systolic (congestive) heart failure: Secondary | ICD-10-CM | POA: Diagnosis not present

## 2022-10-12 DIAGNOSIS — I428 Other cardiomyopathies: Secondary | ICD-10-CM | POA: Diagnosis not present

## 2022-10-12 DIAGNOSIS — Z720 Tobacco use: Secondary | ICD-10-CM | POA: Diagnosis not present

## 2022-10-23 DIAGNOSIS — L910 Hypertrophic scar: Secondary | ICD-10-CM | POA: Diagnosis not present

## 2022-11-04 ENCOUNTER — Encounter: Payer: Self-pay | Admitting: Family

## 2022-11-04 ENCOUNTER — Ambulatory Visit: Payer: BC Managed Care – PPO | Attending: Family | Admitting: Family

## 2022-11-04 VITALS — BP 120/72 | HR 93 | Resp 20 | Wt 295.0 lb

## 2022-11-04 DIAGNOSIS — Z794 Long term (current) use of insulin: Secondary | ICD-10-CM | POA: Diagnosis not present

## 2022-11-04 DIAGNOSIS — I110 Hypertensive heart disease with heart failure: Secondary | ICD-10-CM | POA: Diagnosis not present

## 2022-11-04 DIAGNOSIS — I10 Essential (primary) hypertension: Secondary | ICD-10-CM | POA: Diagnosis not present

## 2022-11-04 DIAGNOSIS — I5022 Chronic systolic (congestive) heart failure: Secondary | ICD-10-CM | POA: Insufficient documentation

## 2022-11-04 DIAGNOSIS — E119 Type 2 diabetes mellitus without complications: Secondary | ICD-10-CM | POA: Diagnosis not present

## 2022-11-04 DIAGNOSIS — Z87891 Personal history of nicotine dependence: Secondary | ICD-10-CM | POA: Diagnosis not present

## 2022-11-04 NOTE — Patient Instructions (Signed)
Continue weighing daily and call for an overnight weight gain of 3 pounds or more or a weekly weight gain of more than 5 pounds.   If you have voicemail, please make sure your mailbox is cleaned out so that we may leave a message and please make sure to listen to any voicemails.     

## 2022-11-04 NOTE — Progress Notes (Signed)
Patient ID: Gary Bentley, male    DOB: 1971-02-27, 52 y.o.   MRN: [id number]   Gary Bentley is a 51 y/o male with a history of DM, HTN, previous tobacco use and chronic heart failure.   Echo report from 06/04/21 revealed moderate LV systolic dysfunction with mild LVH, mild valvular regurgitation and estimated EF of 30% which remains unchanged since his previous echocardiogram in 2021. Echo report from 01/10/2019 reviewed and showed an EF of 15-20% along with trivial AR and mild pulmonic stenosis.   Catheterization done 01/13/2019 showed: Nonischemic cardiomyopathy, no significant CAD Mildly elevated wedge pressure,  EF severely depressed, global Hypokinesis, EF 20%  Has not been admitted or been in the ED in the last 6 months.   He presents today with a chief complaint of a follow-up visit. Currently has no complaints and specifically denies any difficulty sleeping, dizziness, abdominal distention, palpitations, pedal edema, chest pain, shortness of breath, cough or fatigue.   Past Medical History:  Diagnosis Date   CHF (congestive heart failure) ([place])    Diabetes mellitus without complication ([place])    Hypertension    Past Surgical History:  Procedure Laterality Date   AMPUTATION TOE Left 01/05/2020   Procedure: AMPUTATION TOE MPJ LEFT;  Surgeon: Samara Deist, DPM;  Location: [facility];  Service: Podiatry;  Laterality: Left;   FOOT SURGERY     RIGHT/LEFT HEART CATH AND CORONARY ANGIOGRAPHY N/A 01/13/2019   Procedure: RIGHT/LEFT HEART CATH AND CORONARY ANGIOGRAPHY;  Surgeon: Minna Merritts, MD;  Location: [place] [facility];  Service: Cardiovascular;  Laterality: N/A;   Family History  Family history unknown: Yes   Social History   Tobacco Use   Smoking status: Former    Packs/day: 1.00    Types: Cigarettes    Quit date: 04/17/2017    Years since quitting: 5.5   Smokeless tobacco: Never  Substance Use Topics   Alcohol use: No   Allergies  Allergen Reactions   Bee  Venom Anaphylaxis   Prior to Admission medications   Medication Sig Start Date End Date Taking? Authorizing Provider  dapagliflozin propanediol (FARXIGA) 10 MG TABS tablet Take 1 tablet (10 mg total) by mouth daily before breakfast. 03/30/22  Yes Darylene Price A, FNP  furosemide (LASIX) 40 MG tablet Take 1 tablet (40 mg total) by mouth 2 (two) times daily. 04/01/21  Yes Merilee Wible, Otila Kluver A, FNP  insulin aspart protamine - aspart (NOVOLOG MIX 70/30 FLEXPEN) (70-30) 100 UNIT/ML FlexPen Inject 0.1 mLs (10 Units total) into the skin 2 (two) times daily. 01/15/19  Yes Dustin Flock, MD  isosorbide mononitrate (IMDUR) 30 MG 24 hr tablet Take 1 tablet (30 mg total) by mouth daily. 03/30/22  Yes Darylene Price A, FNP  metoprolol succinate (TOPROL-XL) 50 MG 24 hr tablet Take 1 tablet (50 mg total) by mouth daily. Take with or immediately following a meal. 03/30/22  Yes Micharl Helmes A, FNP  sacubitril-valsartan (ENTRESTO) 49-51 MG Take 1 tablet by mouth 2 (two) times daily. 03/30/22  Yes Darylene Price A, FNP  spironolactone (ALDACTONE) 25 MG tablet Take 0.5 tablets (12.5 mg total) by mouth daily. 03/30/22  Yes Merlin Ege A, FNP  Cysteamine Bitartrate (PROCYSBI) 300 MG PACK Use 1 each 3 (three) times daily Use as instructed. ONE TOUCH DELICA LANCETS D32.6 Patient not taking: Reported on 08/06/2022 09/11/20   [provider]  potassium chloride SA (KLOR-CON M) 20 MEQ tablet Take by mouth as needed. With lasix Patient not taking: Reported on 08/06/2022  01/02/22   [provider]   Review of Systems  Constitutional:  Negative for appetite change and fatigue.  HENT:  Negative for congestion, postnasal drip and sore throat.   Eyes: Negative.   Respiratory:  Negative for cough, chest tightness and shortness of breath.   Cardiovascular:  Negative for chest pain, palpitations and leg swelling.  Gastrointestinal:  Negative for abdominal distention and abdominal pain.  Endocrine: Negative.   Genitourinary:  Negative.   Musculoskeletal:  Negative for arthralgias, back pain and neck pain.  Skin: Negative.   Allergic/Immunologic: Negative.   Neurological:  Negative for dizziness, syncope and light-headedness.  Hematological:  Negative for adenopathy. Does not bruise/bleed easily.  Psychiatric/Behavioral:  Negative for dysphoric mood and sleep disturbance (sleeping on 2 pillows). The patient is not nervous/anxious.    Vitals:   11/04/22 1335  BP: 120/72  Pulse: 93  Resp: 20  SpO2: 100%  Weight: 295 lb (133.8 kg)   Wt Readings from Last 3 Encounters:  11/04/22 295 lb (133.8 kg)  08/06/22 292 lb 6 oz (132.6 kg)  03/30/22 288 lb 4 oz (130.7 kg)   Lab Results  Component Value Date   CREATININE 1.18 02/03/2022   CREATININE 1.15 01/27/2022   CREATININE 1.19 08/28/2021   Physical Exam Vitals and nursing note reviewed.  Constitutional:      General: He is not in acute distress.    Appearance: Normal appearance. He is not ill-appearing.  HENT:     Head: Normocephalic and atraumatic.  Neck:     Comments: No JVD Cardiovascular:     Rate and Rhythm: Normal rate and regular rhythm.     Pulses: Normal pulses.     Heart sounds: Normal heart sounds. No murmur heard.    No gallop.  Pulmonary:     Effort: Pulmonary effort is normal.     Breath sounds: No wheezing, rhonchi or rales.  Abdominal:     General: There is no distension.     Palpations: Abdomen is soft.     Tenderness: There is no abdominal tenderness.  Musculoskeletal:        General: No tenderness.     Cervical back: Normal range of motion and neck supple.     Right lower leg: No edema.     Left lower leg: No edema.  Skin:    General: Skin is warm and dry.  Neurological:     General: No focal deficit present.     Mental Status: He is alert and oriented to person, place, and time.  Psychiatric:        Mood and Affect: Mood normal.        Behavior: Behavior normal.        Thought Content: Thought content normal.     Assessment & Plan:  1: Chronic heart failure with reduced ejection fraction- - NYHA class I - euvolemic today - weighing daily; reminded to call for an overnight weight gain of >2 pounds or a weekly weight gain of >5 pounds - weight up 3 pounds since last visit here 3 months ago - not adding salt and is trying to limit his sodium intake although does eat out - saw cardiology [name]) 10/12/22 - participating in the paramedicine program  - on GDMT of entresto, farxiga, spironolactone & metoprolol  - BNP 07/10/2019 was 968.0 - he does not take the flu vaccine; encouraged good handwashing  2: HTN- - BP looks good (120/72) - saw PCP [name]) 09/15/22 - BMP done 10/01/22  and showed sodium 138, potassium 4.2, creatinine 1.3 and GFR 68  3: DM- - A1c 10/01/22 was 8.3% - glucose this morning at home was 141   Medication list reviewed although he didn't bring meds and isn't confident about anything he's taking. Emphasized bringing his meds for review to every visit  Return in 6 months, sooner if needed

## 2022-11-10 DIAGNOSIS — B351 Tinea unguium: Secondary | ICD-10-CM | POA: Diagnosis not present

## 2022-11-10 DIAGNOSIS — E1142 Type 2 diabetes mellitus with diabetic polyneuropathy: Secondary | ICD-10-CM | POA: Diagnosis not present

## 2022-11-10 DIAGNOSIS — Z89412 Acquired absence of left great toe: Secondary | ICD-10-CM | POA: Diagnosis not present

## 2022-11-10 DIAGNOSIS — L97511 Non-pressure chronic ulcer of other part of right foot limited to breakdown of skin: Secondary | ICD-10-CM | POA: Diagnosis not present

## 2022-12-04 DIAGNOSIS — L910 Hypertrophic scar: Secondary | ICD-10-CM | POA: Diagnosis not present

## 2022-12-29 DIAGNOSIS — L97511 Non-pressure chronic ulcer of other part of right foot limited to breakdown of skin: Secondary | ICD-10-CM | POA: Diagnosis not present

## 2022-12-29 DIAGNOSIS — E1142 Type 2 diabetes mellitus with diabetic polyneuropathy: Secondary | ICD-10-CM | POA: Diagnosis not present

## 2022-12-29 DIAGNOSIS — L97512 Non-pressure chronic ulcer of other part of right foot with fat layer exposed: Secondary | ICD-10-CM | POA: Diagnosis not present

## 2022-12-29 DIAGNOSIS — L03031 Cellulitis of right toe: Secondary | ICD-10-CM | POA: Diagnosis not present

## 2022-12-29 DIAGNOSIS — Z89412 Acquired absence of left great toe: Secondary | ICD-10-CM | POA: Diagnosis not present

## 2023-01-05 DIAGNOSIS — E1142 Type 2 diabetes mellitus with diabetic polyneuropathy: Secondary | ICD-10-CM | POA: Diagnosis not present

## 2023-01-05 DIAGNOSIS — L03031 Cellulitis of right toe: Secondary | ICD-10-CM | POA: Diagnosis not present

## 2023-01-05 DIAGNOSIS — L97511 Non-pressure chronic ulcer of other part of right foot limited to breakdown of skin: Secondary | ICD-10-CM | POA: Diagnosis not present

## 2023-01-12 DIAGNOSIS — L03031 Cellulitis of right toe: Secondary | ICD-10-CM | POA: Diagnosis not present

## 2023-01-12 DIAGNOSIS — E1142 Type 2 diabetes mellitus with diabetic polyneuropathy: Secondary | ICD-10-CM | POA: Diagnosis not present

## 2023-01-12 DIAGNOSIS — L97512 Non-pressure chronic ulcer of other part of right foot with fat layer exposed: Secondary | ICD-10-CM | POA: Diagnosis not present

## 2023-01-12 DIAGNOSIS — Z89412 Acquired absence of left great toe: Secondary | ICD-10-CM | POA: Diagnosis not present

## 2023-01-19 DIAGNOSIS — L03031 Cellulitis of right toe: Secondary | ICD-10-CM | POA: Diagnosis not present

## 2023-01-19 DIAGNOSIS — E1142 Type 2 diabetes mellitus with diabetic polyneuropathy: Secondary | ICD-10-CM | POA: Diagnosis not present

## 2023-01-19 DIAGNOSIS — L97512 Non-pressure chronic ulcer of other part of right foot with fat layer exposed: Secondary | ICD-10-CM | POA: Diagnosis not present

## 2023-02-16 DIAGNOSIS — L97512 Non-pressure chronic ulcer of other part of right foot with fat layer exposed: Secondary | ICD-10-CM | POA: Diagnosis not present

## 2023-02-16 DIAGNOSIS — L03031 Cellulitis of right toe: Secondary | ICD-10-CM | POA: Diagnosis not present

## 2023-02-16 DIAGNOSIS — E1142 Type 2 diabetes mellitus with diabetic polyneuropathy: Secondary | ICD-10-CM | POA: Diagnosis not present

## 2023-03-04 DIAGNOSIS — Z89412 Acquired absence of left great toe: Secondary | ICD-10-CM | POA: Diagnosis not present

## 2023-03-04 DIAGNOSIS — L97511 Non-pressure chronic ulcer of other part of right foot limited to breakdown of skin: Secondary | ICD-10-CM | POA: Diagnosis not present

## 2023-03-04 DIAGNOSIS — L97512 Non-pressure chronic ulcer of other part of right foot with fat layer exposed: Secondary | ICD-10-CM | POA: Diagnosis not present

## 2023-03-04 DIAGNOSIS — L03031 Cellulitis of right toe: Secondary | ICD-10-CM | POA: Diagnosis not present

## 2023-03-04 DIAGNOSIS — E1142 Type 2 diabetes mellitus with diabetic polyneuropathy: Secondary | ICD-10-CM | POA: Diagnosis not present

## 2023-03-09 DIAGNOSIS — H33101 Unspecified retinoschisis, right eye: Secondary | ICD-10-CM | POA: Diagnosis not present

## 2023-03-09 DIAGNOSIS — H52223 Regular astigmatism, bilateral: Secondary | ICD-10-CM | POA: Diagnosis not present

## 2023-03-09 DIAGNOSIS — H2513 Age-related nuclear cataract, bilateral: Secondary | ICD-10-CM | POA: Diagnosis not present

## 2023-03-09 DIAGNOSIS — E119 Type 2 diabetes mellitus without complications: Secondary | ICD-10-CM | POA: Diagnosis not present

## 2023-03-25 DIAGNOSIS — L03031 Cellulitis of right toe: Secondary | ICD-10-CM | POA: Diagnosis not present

## 2023-03-25 DIAGNOSIS — L97512 Non-pressure chronic ulcer of other part of right foot with fat layer exposed: Secondary | ICD-10-CM | POA: Diagnosis not present

## 2023-03-25 DIAGNOSIS — Z89412 Acquired absence of left great toe: Secondary | ICD-10-CM | POA: Diagnosis not present

## 2023-03-25 DIAGNOSIS — E1142 Type 2 diabetes mellitus with diabetic polyneuropathy: Secondary | ICD-10-CM | POA: Diagnosis not present

## 2023-04-22 DIAGNOSIS — E1142 Type 2 diabetes mellitus with diabetic polyneuropathy: Secondary | ICD-10-CM | POA: Diagnosis not present

## 2023-04-22 DIAGNOSIS — L97512 Non-pressure chronic ulcer of other part of right foot with fat layer exposed: Secondary | ICD-10-CM | POA: Diagnosis not present

## 2023-04-22 DIAGNOSIS — L03031 Cellulitis of right toe: Secondary | ICD-10-CM | POA: Diagnosis not present

## 2023-04-23 ENCOUNTER — Encounter: Payer: Self-pay | Admitting: Podiatry

## 2023-04-30 ENCOUNTER — Other Ambulatory Visit: Payer: Self-pay | Admitting: Podiatry

## 2023-04-30 DIAGNOSIS — L97512 Non-pressure chronic ulcer of other part of right foot with fat layer exposed: Secondary | ICD-10-CM

## 2023-04-30 DIAGNOSIS — E1142 Type 2 diabetes mellitus with diabetic polyneuropathy: Secondary | ICD-10-CM

## 2023-04-30 DIAGNOSIS — L03031 Cellulitis of right toe: Secondary | ICD-10-CM

## 2023-05-03 ENCOUNTER — Encounter: Payer: Self-pay | Admitting: Podiatry

## 2023-05-05 ENCOUNTER — Ambulatory Visit
Admission: RE | Admit: 2023-05-05 | Discharge: 2023-05-05 | Disposition: A | Discharge status: Home / Self Care | Payer: BC Managed Care – PPO | Source: Ambulatory Visit | Attending: Podiatry | Admitting: Podiatry

## 2023-05-05 ENCOUNTER — Encounter: Payer: BC Managed Care – PPO | Admitting: Family

## 2023-05-05 DIAGNOSIS — L03031 Cellulitis of right toe: Secondary | ICD-10-CM | POA: Diagnosis not present

## 2023-05-05 DIAGNOSIS — L97512 Non-pressure chronic ulcer of other part of right foot with fat layer exposed: Secondary | ICD-10-CM

## 2023-05-05 DIAGNOSIS — E1142 Type 2 diabetes mellitus with diabetic polyneuropathy: Secondary | ICD-10-CM

## 2023-05-06 ENCOUNTER — Telehealth: Payer: Self-pay | Admitting: Family

## 2023-05-06 ENCOUNTER — Encounter: Payer: BC Managed Care – PPO | Admitting: Family

## 2023-05-06 NOTE — Progress Notes (Deleted)
PCP: Primary Cardiologist:  HPI:  [name] is a 52 y/o male with a history of DM, HTN, previous tobacco use and chronic heart failure.   Echo report from 06/04/21 revealed moderate LV systolic dysfunction with mild LVH, mild valvular regurgitation and estimated EF of 30% which remains unchanged since his previous echocardiogram in 2021. Echo report from 01/10/2019 reviewed and showed an EF of 15-20% along with trivial AR and mild pulmonic stenosis.   Catheterization done 01/13/2019 showed: Nonischemic cardiomyopathy, no significant CAD Mildly elevated wedge pressure,  EF severely depressed, global Hypokinesis, EF 20%  Has not been admitted or been in the ED in the last 6 months.   He presents today with a chief complaint of a follow-up visit. Currently has no complaints and specifically denies any difficulty sleeping, dizziness, abdominal distention, palpitations, pedal edema, chest pain, shortness of breath, cough or fatigue.      ROS: All systems negative except as listed in HPI, PMH and Problem List.  SH:  Social History   Socioeconomic History   Marital status: Single    Spouse name: Not on file   Number of children: Not on file   Years of education: Not on file   Highest education level: Not on file  Occupational History   Not on file  Tobacco Use   Smoking status: Former    Packs/day: 1    Types: Cigarettes    Quit date: 04/17/2017    Years since quitting: 6.0   Smokeless tobacco: Never  Vaping Use   Vaping Use: Never used  Substance and Sexual Activity   Alcohol use: No   Drug use: No   Sexual activity: Not Currently  Other Topics Concern   Not on file  Social History Narrative   No place to live at this time, independent otherwise   Social Determinants of Health   Financial Resource Strain: Low Risk  (09/20/2019)   Overall Financial Resource Strain (CARDIA)    Difficulty of Paying Living Expenses: Not hard at all  Food Insecurity: No Food Insecurity  (09/20/2019)   Hunger Vital Sign    Worried About Running Out of Food in the Last Year: Never true    Ran Out of Food in the Last Year: Never true  Transportation Needs: No Transportation Needs (09/20/2019)   PRAPARE - Administrator, Civil Service (Medical): No    Lack of Transportation (Non-Medical): No  Physical Activity: Sufficiently Active (08/25/2019)   Exercise Vital Sign    Days of Exercise per Week: 5 days    Minutes of Exercise per Session: 150+ min  Stress: No Stress Concern Present (08/25/2019)   [employer] of Occupational Health - Occupational Stress Questionnaire    Feeling of Stress : Not at all  Social Connections: Unknown (08/25/2019)   Social Connection and Isolation Panel [NHANES]    Frequency of Communication with Friends and Family: Never    Frequency of Social Gatherings with Friends and Family: Never    Attends Religious Services: Never    Database administrator or Organizations: No    Attends Banker Meetings: Never    Marital Status: Not on file  Intimate Partner Violence: Not At Risk (08/25/2019)   Humiliation, Afraid, Rape, and Kick questionnaire    Fear of Current or Ex-Partner: No    Emotionally Abused: No    Physically Abused: No    Sexually Abused: No    FH:  Family History  Family history unknown: Yes  Past Medical History:  Diagnosis Date   CHF (congestive heart failure) ([facility])    Diabetes mellitus without complication ([facility])    Hypertension     Current Outpatient Medications  Medication Sig Dispense Refill   Cysteamine Bitartrate (PROCYSBI) 300 MG PACK Use 1 each 3 (three) times daily Use as instructed. ONE TOUCH DELICA LANCETS E11.9 (Patient not taking: Reported on 08/06/2022)     dapagliflozin propanediol (FARXIGA) 10 MG TABS tablet Take 1 tablet (10 mg total) by mouth daily before breakfast. 90 tablet 3   furosemide (LASIX) 40 MG tablet Take 1 tablet (40 mg total) by mouth 2 (two) times daily. 180 tablet 3    insulin aspart protamine - aspart (NOVOLOG MIX 70/30 FLEXPEN) (70-30) 100 UNIT/ML FlexPen Inject 0.1 mLs (10 Units total) into the skin 2 (two) times daily. 15 mL 11   isosorbide mononitrate (IMDUR) 30 MG 24 hr tablet Take 1 tablet (30 mg total) by mouth daily. 90 tablet 3   metoprolol succinate (TOPROL-XL) 50 MG 24 hr tablet Take 1 tablet (50 mg total) by mouth daily. Take with or immediately following a meal. 90 tablet 3   potassium chloride SA (KLOR-CON M) 20 MEQ tablet Take by mouth as needed. With lasix (Patient not taking: Reported on 08/06/2022)     sacubitril-valsartan (ENTRESTO) 49-51 MG Take 1 tablet by mouth 2 (two) times daily. 180 tablet 3   spironolactone (ALDACTONE) 25 MG tablet Take 0.5 tablets (12.5 mg total) by mouth daily. 45 tablet 3   No current facility-administered medications for this visit.      PHYSICAL EXAM:  General:  Well appearing. No resp difficulty HEENT: normal Neck: supple. JVP flat. Carotids 2+ bilaterally; no bruits. No lymphadenopathy or thryomegaly appreciated. Cor: PMI normal. Regular rate & rhythm. No rubs, gallops or murmurs. Lungs: clear Abdomen: soft, nontender, nondistended. No hepatosplenomegaly. No bruits or masses. Good bowel sounds. Extremities: no cyanosis, clubbing, rash, edema Neuro: alert & orientedx3, cranial nerves grossly intact. Moves all 4 extremities w/o difficulty. Affect pleasant.   ECG:   ASSESSMENT & PLAN:  1: Chronic heart failure with reduced ejection fraction- - NYHA class I - euvolemic today - weighing daily; reminded to call for an overnight weight gain of >2 pounds or a weekly weight gain of >5 pounds - weight up 3 pounds since last visit here 3 months ago - not adding salt and is trying to limit his sodium intake although does eat out - saw cardiology Cliffton Asters) 10/12/22 - participating in the paramedicine program  - on GDMT of entresto, farxiga, spironolactone & metoprolol  - BNP 07/10/2019 was 968.0 - he does  not take the flu vaccine; encouraged good handwashing  2: HTN- - BP looks good (120/72) - saw PCP Letitia Libra) 09/15/22 - BMP done 10/01/22 and showed sodium 138, potassium 4.2, creatinine 1.3 and GFR 68  3: DM- - A1c 10/01/22 was 8.3% - glucose this morning at home was 141   Medication list reviewed although he didn't bring meds and isn't confident about anything he's taking. Emphasized bringing his meds for review to every visit  Return in 6 months, sooner if needed

## 2023-05-06 NOTE — Telephone Encounter (Signed)
Patient did not show for his Heart Failure Clinic appointment on 05/06/23

## 2023-05-18 DIAGNOSIS — E1142 Type 2 diabetes mellitus with diabetic polyneuropathy: Secondary | ICD-10-CM | POA: Diagnosis not present

## 2023-05-18 DIAGNOSIS — M869 Osteomyelitis, unspecified: Secondary | ICD-10-CM | POA: Diagnosis not present

## 2023-05-19 ENCOUNTER — Emergency Department: Payer: BC Managed Care – PPO

## 2023-05-19 ENCOUNTER — Other Ambulatory Visit: Payer: Self-pay

## 2023-05-19 ENCOUNTER — Inpatient Hospital Stay
Admission: EM | Admit: 2023-05-19 | Discharge: 2023-05-22 | DRG: 617 | Disposition: A | Discharge status: Home / Self Care | Payer: BC Managed Care – PPO | Attending: Internal Medicine | Admitting: Internal Medicine

## 2023-05-19 DIAGNOSIS — E669 Obesity, unspecified: Secondary | ICD-10-CM | POA: Diagnosis present

## 2023-05-19 DIAGNOSIS — Z791 Long term (current) use of non-steroidal anti-inflammatories (NSAID): Secondary | ICD-10-CM

## 2023-05-19 DIAGNOSIS — M8610 Other acute osteomyelitis, unspecified site: Secondary | ICD-10-CM | POA: Diagnosis present

## 2023-05-19 DIAGNOSIS — I272 Pulmonary hypertension, unspecified: Secondary | ICD-10-CM | POA: Diagnosis not present

## 2023-05-19 DIAGNOSIS — I5022 Chronic systolic (congestive) heart failure: Secondary | ICD-10-CM

## 2023-05-19 DIAGNOSIS — M84474A Pathological fracture, right foot, initial encounter for fracture: Secondary | ICD-10-CM | POA: Diagnosis not present

## 2023-05-19 DIAGNOSIS — Z91030 Bee allergy status: Secondary | ICD-10-CM | POA: Diagnosis not present

## 2023-05-19 DIAGNOSIS — I96 Gangrene, not elsewhere classified: Secondary | ICD-10-CM | POA: Diagnosis not present

## 2023-05-19 DIAGNOSIS — Z91148 Patient's other noncompliance with medication regimen for other reason: Secondary | ICD-10-CM | POA: Diagnosis not present

## 2023-05-19 DIAGNOSIS — M86171 Other acute osteomyelitis, right ankle and foot: Secondary | ICD-10-CM

## 2023-05-19 DIAGNOSIS — I428 Other cardiomyopathies: Secondary | ICD-10-CM | POA: Diagnosis present

## 2023-05-19 DIAGNOSIS — N182 Chronic kidney disease, stage 2 (mild): Secondary | ICD-10-CM | POA: Diagnosis present

## 2023-05-19 DIAGNOSIS — L03031 Cellulitis of right toe: Secondary | ICD-10-CM

## 2023-05-19 DIAGNOSIS — L97519 Non-pressure chronic ulcer of other part of right foot with unspecified severity: Secondary | ICD-10-CM | POA: Diagnosis present

## 2023-05-19 DIAGNOSIS — Z87891 Personal history of nicotine dependence: Secondary | ICD-10-CM

## 2023-05-19 DIAGNOSIS — E1140 Type 2 diabetes mellitus with diabetic neuropathy, unspecified: Secondary | ICD-10-CM | POA: Diagnosis present

## 2023-05-19 DIAGNOSIS — I5020 Unspecified systolic (congestive) heart failure: Secondary | ICD-10-CM | POA: Diagnosis present

## 2023-05-19 DIAGNOSIS — E1169 Type 2 diabetes mellitus with other specified complication: Secondary | ICD-10-CM | POA: Diagnosis not present

## 2023-05-19 DIAGNOSIS — Z79899 Other long term (current) drug therapy: Secondary | ICD-10-CM | POA: Diagnosis not present

## 2023-05-19 DIAGNOSIS — D631 Anemia in chronic kidney disease: Secondary | ICD-10-CM | POA: Diagnosis present

## 2023-05-19 DIAGNOSIS — E08621 Diabetes mellitus due to underlying condition with foot ulcer: Secondary | ICD-10-CM | POA: Diagnosis not present

## 2023-05-19 DIAGNOSIS — I130 Hypertensive heart and chronic kidney disease with heart failure and stage 1 through stage 4 chronic kidney disease, or unspecified chronic kidney disease: Secondary | ICD-10-CM | POA: Diagnosis not present

## 2023-05-19 DIAGNOSIS — R590 Localized enlarged lymph nodes: Secondary | ICD-10-CM | POA: Diagnosis not present

## 2023-05-19 DIAGNOSIS — E119 Type 2 diabetes mellitus without complications: Secondary | ICD-10-CM | POA: Diagnosis not present

## 2023-05-19 DIAGNOSIS — M79604 Pain in right leg: Secondary | ICD-10-CM | POA: Diagnosis not present

## 2023-05-19 DIAGNOSIS — L03115 Cellulitis of right lower limb: Secondary | ICD-10-CM | POA: Diagnosis not present

## 2023-05-19 DIAGNOSIS — Z794 Long term (current) use of insulin: Secondary | ICD-10-CM | POA: Diagnosis not present

## 2023-05-19 DIAGNOSIS — Z89422 Acquired absence of other left toe(s): Secondary | ICD-10-CM | POA: Diagnosis not present

## 2023-05-19 DIAGNOSIS — E11621 Type 2 diabetes mellitus with foot ulcer: Secondary | ICD-10-CM | POA: Diagnosis not present

## 2023-05-19 DIAGNOSIS — Z6836 Body mass index (BMI) 36.0-36.9, adult: Secondary | ICD-10-CM | POA: Diagnosis not present

## 2023-05-19 DIAGNOSIS — D649 Anemia, unspecified: Secondary | ICD-10-CM

## 2023-05-19 DIAGNOSIS — I10 Essential (primary) hypertension: Secondary | ICD-10-CM

## 2023-05-19 DIAGNOSIS — E1122 Type 2 diabetes mellitus with diabetic chronic kidney disease: Secondary | ICD-10-CM | POA: Diagnosis not present

## 2023-05-19 DIAGNOSIS — L97512 Non-pressure chronic ulcer of other part of right foot with fat layer exposed: Secondary | ICD-10-CM | POA: Diagnosis not present

## 2023-05-19 DIAGNOSIS — R9431 Abnormal electrocardiogram [ECG] [EKG]: Secondary | ICD-10-CM | POA: Diagnosis not present

## 2023-05-19 DIAGNOSIS — I5023 Acute on chronic systolic (congestive) heart failure: Secondary | ICD-10-CM | POA: Diagnosis not present

## 2023-05-19 DIAGNOSIS — M7989 Other specified soft tissue disorders: Secondary | ICD-10-CM | POA: Diagnosis not present

## 2023-05-19 DIAGNOSIS — S91101A Unspecified open wound of right great toe without damage to nail, initial encounter: Secondary | ICD-10-CM | POA: Diagnosis not present

## 2023-05-19 LAB — BASIC METABOLIC PANEL
Anion gap: 8 (ref 5–15)
BUN: 19 mg/dL (ref 6–20)
CO2: 23 mmol/L (ref 22–32)
Calcium: 8.9 mg/dL (ref 8.9–10.3)
Chloride: 106 mmol/L (ref 98–111)
Creatinine, Ser: 1.44 mg/dL — ABNORMAL HIGH (ref 0.61–1.24)
GFR, Estimated: 59 mL/min — ABNORMAL LOW (ref >60)
Glucose, Bld: 134 mg/dL — ABNORMAL HIGH (ref 70–99)
Potassium: 3.6 mmol/L (ref 3.5–5.1)
Sodium: 137 mmol/L (ref 135–145)

## 2023-05-19 LAB — CBC
HCT: 35 % — ABNORMAL LOW (ref 39.0–52.0)
Hemoglobin: 10.9 g/dL — ABNORMAL LOW (ref 13.0–17.0)
MCH: 30.6 pg (ref 26.0–34.0)
MCHC: 31.1 g/dL (ref 30.0–36.0)
MCV: 98.3 fL (ref 80.0–100.0)
Platelets: 392 10*3/uL (ref 150–400)
RBC: 3.56 MIL/uL — ABNORMAL LOW (ref 4.22–5.81)
RDW: 12.5 % (ref 11.5–15.5)
WBC: 7.9 10*3/uL (ref 4.0–10.5)
nRBC: 0 % (ref 0.0–0.2)

## 2023-05-19 LAB — CBG MONITORING, ED
Glucose-Capillary: 73 mg/dL (ref 70–99)
Glucose-Capillary: 83 mg/dL (ref 70–99)

## 2023-05-19 MED ORDER — ACETAMINOPHEN 650 MG RE SUPP: 650.0000 mg | Freq: Four times a day (QID) | RECTAL | Status: DC | PRN

## 2023-05-19 MED ORDER — ACETAMINOPHEN 325 MG PO TABS: 650.0000 mg | ORAL_TABLET | Freq: Four times a day (QID) | ORAL | Status: DC | PRN

## 2023-05-19 MED ORDER — ONDANSETRON HCL 4 MG PO TABS: 4.0000 mg | ORAL_TABLET | Freq: Four times a day (QID) | ORAL | Status: DC | PRN

## 2023-05-19 MED ORDER — SODIUM CHLORIDE 0.9% FLUSH
3.0000 mL | Freq: Two times a day (BID) | INTRAVENOUS | Status: DC
  Administered 2023-05-19 – 2023-05-21 (×4): 3 mL via INTRAVENOUS

## 2023-05-19 MED ORDER — HYDROMORPHONE HCL 1 MG/ML IJ SOLN: 0.5000 mg | INTRAMUSCULAR | Status: DC | PRN

## 2023-05-19 MED ORDER — INSULIN ASPART 100 UNIT/ML IJ SOLN
0.0000 [IU] | Freq: Three times a day (TID) | INTRAMUSCULAR | Status: DC
  Administered 2023-05-21: 8 [IU] via SUBCUTANEOUS
  Filled 2023-05-19: qty 1

## 2023-05-19 MED ORDER — PIPERACILLIN-TAZOBACTAM 3.375 G IVPB 30 MIN: 3.3750 g | Freq: Once | INTRAVENOUS | Status: DC

## 2023-05-19 MED ORDER — HYDROCODONE-ACETAMINOPHEN 5-325 MG PO TABS
1.0000 | ORAL_TABLET | Freq: Once | ORAL | Status: AC
  Administered 2023-05-19: 1 via ORAL
  Filled 2023-05-19: qty 1

## 2023-05-19 MED ORDER — VANCOMYCIN HCL 1250 MG/250ML IV SOLN
1250.0000 mg | Freq: Once | INTRAVENOUS | Status: AC
  Administered 2023-05-19: 1250 mg via INTRAVENOUS
  Filled 2023-05-19: qty 250

## 2023-05-19 MED ORDER — SODIUM CHLORIDE 0.9 % IV SOLN
2.0000 g | INTRAVENOUS | Status: DC
  Administered 2023-05-19: 2 g via INTRAVENOUS
  Filled 2023-05-19 (×2): qty 20

## 2023-05-19 MED ORDER — VANCOMYCIN HCL 1500 MG/300ML IV SOLN
1500.0000 mg | Freq: Two times a day (BID) | INTRAVENOUS | Status: DC
  Administered 2023-05-20: 1500 mg via INTRAVENOUS
  Filled 2023-05-19: qty 300

## 2023-05-19 MED ORDER — HYDROCODONE-ACETAMINOPHEN 5-325 MG PO TABS: 1.0000 | ORAL_TABLET | Freq: Four times a day (QID) | ORAL | Status: DC | PRN

## 2023-05-19 MED ORDER — METRONIDAZOLE 500 MG/100ML IV SOLN
500.0000 mg | Freq: Two times a day (BID) | INTRAVENOUS | Status: DC
  Administered 2023-05-19 – 2023-05-20 (×2): 500 mg via INTRAVENOUS
  Filled 2023-05-19 (×3): qty 100

## 2023-05-19 MED ORDER — ONDANSETRON HCL 4 MG/2ML IJ SOLN: 4.0000 mg | Freq: Four times a day (QID) | INTRAMUSCULAR | Status: DC | PRN

## 2023-05-19 MED ORDER — INSULIN GLARGINE-YFGN 100 UNIT/ML ~~LOC~~ SOLN
5.0000 [IU] | Freq: Every day | SUBCUTANEOUS | Status: DC
  Administered 2023-05-19 – 2023-05-20 (×2): 5 [IU] via SUBCUTANEOUS
  Filled 2023-05-19 (×3): qty 0.05

## 2023-05-19 NOTE — Assessment & Plan Note (Addendum)
History of HFrEF due to nonischemic cardiomyopathy with last EF of 30% in 2022.  He follows with the heart failure clinic with last follow-up in December 2023 at which time he was considered stable.  Appears euvolemic at this time.  Per medication reconciliation, patient has not refilled any of his GDMT since January 2024.  Will hold off on restarting preoperatively pending cardiology evaluation.  - Cardiology messaged regarding clearance for surgery given severity of HFrEF

## 2023-05-19 NOTE — Assessment & Plan Note (Addendum)
History of normocytic anemia dating back to February 2020, and has remained relatively stable, although hemoglobin was 13.4 in May 2023 and is currently 10.9.  - Recommend outpatient follow-up with PCP for further evaluation, and consideration of colonoscopy

## 2023-05-19 NOTE — ED Notes (Signed)
Crackers given. 

## 2023-05-19 NOTE — Assessment & Plan Note (Signed)
History of CKD stage II with creatinine ranging between 1.3 and 1.5.  Renal function currently at baseline.

## 2023-05-19 NOTE — Consult Note (Signed)
ORTHOPAEDIC CONSULTATION  REQUESTING PHYSICIAN: Verdene Lennert, MD  Chief Complaint: Infection right great toe.  HPI: Gary Bentley is a 52 y.o. male who complains of worsening infection to his right great toe.  He has been followed in my clinic for the last several months now.  Recently MRI was concerning for osteomyelitis.  Presented to the office yesterday with noted worsening swelling and discoloration with crepitance in the interphalange joint region.  He had an ulcer that probed immediately down to bone with purulent drainage.  At that time I discussed admission to the hospital for planned surgery of his right great toe and possible workup for DVT and vascular concerns.  Ultrasound in the ER was negative for DVT.  I have ordered noninvasive vascular studies to be performed.  He denies any fever or chills.  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  Past Medical History:  Diagnosis Date   CHF (congestive heart failure) (HCC)    Diabetes mellitus without complication (HCC)    Hypertension    Past Surgical History:  Procedure Laterality Date   AMPUTATION TOE Left 01/05/2020   Procedure: AMPUTATION TOE MPJ LEFT;  Surgeon: Gwyneth Revels, DPM;  Location: [facility] ORS;  Service: Podiatry;  Laterality: Left;   FOOT SURGERY     RIGHT/LEFT HEART CATH AND CORONARY ANGIOGRAPHY N/A 01/13/2019   Procedure: RIGHT/LEFT HEART CATH AND CORONARY ANGIOGRAPHY;  Surgeon: Antonieta Iba, MD;  Location: [facility] INVASIVE CV [facility];  Service: Cardiovascular;  Laterality: N/A;   Social History   Socioeconomic History   Marital status: Single    Spouse name: Not on file   Number of children: Not on file   Years of education: Not on file   Highest education level: Not on file  Occupational History   Not on file  Tobacco Use   Smoking status: Former    Packs/day: 1    Types: Cigarettes    Quit date: 04/17/2017    Years since quitting: 6.0   Smokeless tobacco: Never  Vaping Use   Vaping Use:  Never used  Substance and Sexual Activity   Alcohol use: No   Drug use: No   Sexual activity: Not Currently  Other Topics Concern   Not on file  Social History Narrative   No place to live at this time, independent otherwise   Social Determinants of Health   Financial Resource Strain: Low Risk  (09/20/2019)   Overall Financial Resource Strain (CARDIA)    Difficulty of Paying Living Expenses: Not hard at all  Food Insecurity: No Food Insecurity (09/20/2019)   Hunger Vital Sign    Worried About Running Out of Food in the Last Year: Never true    Ran Out of Food in the Last Year: Never true  Transportation Needs: No Transportation Needs (09/20/2019)   PRAPARE - Administrator, Civil Service (Medical): No    Lack of Transportation (Non-Medical): No  Physical Activity: Sufficiently Active (08/25/2019)   Exercise Vital Sign    Days of Exercise per Week: 5 days    Minutes of Exercise per Session: 150+ min  Stress: No Stress Concern Present (08/25/2019)   [employer] of Occupational Health - Occupational Stress Questionnaire    Feeling of Stress : Not at all  Social Connections: Unknown (08/25/2019)   Social Connection and Isolation Panel [NHANES]    Frequency of Communication with Friends and Family: Never    Frequency of Social Gatherings with Friends and Family: Never  Attends Religious Services: Never    Active Member of Clubs or Organizations: No    Attends Banker Meetings: Never    Marital Status: Not on file   Family History  Family history unknown: Yes   Allergies  Allergen Reactions   Bee Venom Anaphylaxis   Prior to Admission medications   Medication Sig Start Date End Date Taking? Authorizing Provider  dapagliflozin propanediol (FARXIGA) 10 MG TABS tablet Take 1 tablet (10 mg total) by mouth daily before breakfast. 03/30/22  Yes Clarisa Kindred A, FNP  isosorbide mononitrate (IMDUR) 30 MG 24 hr tablet Take 1 tablet (30 mg total) by mouth  daily. 03/30/22  Yes Hackney, Tina A, FNP  meloxicam (MOBIC) 15 MG tablet Take 15 mg by mouth daily. 03/25/23  Yes [provider]  metoprolol succinate (TOPROL-XL) 50 MG 24 hr tablet Take 1 tablet (50 mg total) by mouth daily. Take with or immediately following a meal. 03/30/22  Yes Hackney, Tina A, FNP  Cysteamine Bitartrate (PROCYSBI) 300 MG PACK Use 1 each 3 (three) times daily Use as instructed. ONE TOUCH DELICA LANCETS E11.9 Patient not taking: Reported on 08/06/2022 09/11/20   [provider]  furosemide (LASIX) 40 MG tablet Take 1 tablet (40 mg total) by mouth 2 (two) times daily. Patient not taking: Reported on 05/19/2023 04/01/21   Clarisa Kindred A, FNP  insulin aspart protamine - aspart (NOVOLOG MIX 70/30 FLEXPEN) (70-30) 100 UNIT/ML FlexPen Inject 0.1 mLs (10 Units total) into the skin 2 (two) times daily. Patient not taking: Reported on 05/19/2023 01/15/19   Auburn Bilberry, MD  potassium chloride SA (KLOR-CON M) 20 MEQ tablet Take by mouth as needed. With lasix Patient not taking: Reported on 08/06/2022 01/02/22   [provider]  sacubitril-valsartan (ENTRESTO) 49-51 MG Take 1 tablet by mouth 2 (two) times daily. Patient not taking: Reported on 05/19/2023 03/30/22   Delma Freeze, FNP  spironolactone (ALDACTONE) 25 MG tablet Take 0.5 tablets (12.5 mg total) by mouth daily. Patient not taking: Reported on 05/19/2023 03/30/22   Delma Freeze, FNP   DG Foot Complete Right  Result Date: 05/19/2023 CLINICAL DATA:  Great toe wound. EXAM: RIGHT FOOT COMPLETE - 3+ VIEW COMPARISON:  Radiographs of September 04, 2017.  MRI of May 05, 2023. FINDINGS: There appears to be lytic destruction involving the distal portion of the first proximal phalanx consistent with osteomyelitis. This results in a comminuted pathologic fracture. Dorsal displacement of the first distal phalanx is noted. IMPRESSION: Comminuted pathologic fracture seen involving distal portion of first proximal phalanx  secondary to osteomyelitis. Electronically Signed   By: Lupita Raider M.D.   On: 05/19/2023 16:31   US Venous Img Lower Unilateral Right  Result Date: 05/19/2023 CLINICAL DATA:  Right lower extremity pain and swelling EXAM: RIGHT LOWER EXTREMITY VENOUS DOPPLER ULTRASOUND TECHNIQUE: Gray-scale sonography with compression, as well as color and duplex ultrasound, were performed to evaluate the deep venous system(s) from the level of the common femoral vein through the popliteal and proximal calf veins. COMPARISON:  None Available. FINDINGS: VENOUS Normal compressibility of the common femoral, superficial femoral, and popliteal veins, as well as the visualized calf veins. Visualized portions of profunda femoral vein and great saphenous vein unremarkable. No filling defects to suggest DVT on grayscale or color Doppler imaging. Doppler waveforms show normal direction of venous flow, normal respiratory plasticity and response to augmentation. Limited views of the contralateral common femoral vein are unremarkable. OTHER Enlarged right inguinal lymph nodes  the largest measuring 4.7 x 3.4 x 1.7 cm with fatty hilum. Limitations: none IMPRESSION: 1. No right lower extremity DVT. 2. Enlarged right inguinal lymph nodes. Electronically Signed   By: Larose Hires D.O.   On: 05/19/2023 14:56    Positive ROS: All other systems have been reviewed and were otherwise negative with the exception of those mentioned in the HPI and as above.  12 point ROS was performed.  Physical Exam: General: Alert and oriented.  No apparent distress.  Vascular:  Left foot:Dorsalis Pedis:  diminished Posterior Tibial:  diminished  Right foot: Dorsalis Pedis:  diminished Posterior Tibial:  diminished  Neuro:absent protective sensation  Derm: Large ulceration to the plantar aspect of the right great toe interphalangeal joint region.  Probes immediately to bone.  There is noted purulence from the area.  Dark discoloration consistent with  inflammation.  Ortho/MS: Marked edema to the right foot especially to the right great toe.  Edema to the right lower leg and calf area is actually improved from yesterday in the outpatient clinic.  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  As we previously discussed we will plan for surgical invention with amputation of the right great toe.  I discussed the risk benefits and alternatives and complications associated the surgery today and consent has been given.  Will plan on surgery tomorrow afternoon.  I will order noninvasive vascular testing for the lower extremity.  If he has poor circulation with minimal bleeding in the OR will need vascular assistance.  Due to the amount of infection in the area I would like to go ahead and remove the infectious process prior to any vascular invention if needed.  Appreciate medicine evaluation.  Broad-spectrum antibiotics for now.  Intraoperative cultures will be performed.    Irean Hong, DPM Cell [phone]   05/19/2023 6:15 PM

## 2023-05-19 NOTE — H&P (Addendum)
History and Physical    Patient: Gary Bentley ZOX:[id number] DOB: 1971/02/29 DOA: 05/19/2023 DOS: the patient was seen and examined on 05/19/2023 PCP: Gracelyn Nurse, MD  Patient coming from: Home  Chief Complaint:  Chief Complaint  Patient presents with   Wound Infection   HPI: Gary Bentley is a 52 y.o. male with medical history significant of type 2 diabetes, hypertension, HFrEF with last EF of 30% (2022), who presents to the ED due to wound infection.  Gary Bentley states that Gary Bentley has been dealing with an ulcer on his right great toe for several months now but over the last month or so, Gary Bentley has noticed that the redness and swelling has began to expand into his ankle and up his calf.  Gary Bentley also endorses malodorous purulent discharge from his great toe despite antibiotics.  Gary Bentley denies any fever, chills, chest pain, shortness of breath, nausea, vomiting, diarrhea.  Gary Bentley notes that Gary Bentley is compliant with his heart failure regimen and has not had any difficulties or hospitalizations for heart failure.  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  ED course: On arrival to the ED, patient was normotensive at 116/78 with heart rate of 107.  Heart rate improved to 85 with no intervention.  Gary Bentley was saturating at 96% on room air.  Gary Bentley was afebrile at 98.4.  Initial workup demonstrated normal WBC at 7.9, hemoglobin of 10.9, glucose of 134, creatinine 1.44 with GFR 59.  Lower extremity Doppler negative for DVT.   Right foot x-ray obtained with evidence of osteomyelitis.  Patient started on Zosyn with podiatry consultation.  [facility] contacted for admission.  Review of Systems: As mentioned in the history of present illness. All other systems reviewed and are negative.  Past Medical History:  Diagnosis Date   CHF (congestive heart failure) (HCC)    Diabetes mellitus without complication (HCC)    Hypertension    Past Surgical History:   Procedure Laterality Date   AMPUTATION TOE Left 01/05/2020   Procedure: AMPUTATION TOE MPJ LEFT;  Surgeon: Gwyneth Revels, DPM;  Location: [facility] ORS;  Service: Podiatry;  Laterality: Left;   FOOT SURGERY     RIGHT/LEFT HEART CATH AND CORONARY ANGIOGRAPHY N/A 01/13/2019   Procedure: RIGHT/LEFT HEART CATH AND CORONARY ANGIOGRAPHY;  Surgeon: Antonieta Iba, MD;  Location: [facility] INV[facility]IVE CV LAB;  Service: Cardiovascular;  Laterality: N/A;   Social History:  reports that Gary Bentley quit smoking about 6 years ago. His smoking use included cigarettes. Gary Bentley smoked an average of 1 pack per day. Gary Bentley has never used smokeless tobacco. Gary Bentley reports that Gary Bentley does not drink alcohol and does not use drugs.  Allergies  Allergen Reactions   Bee Venom Anaphylaxis    Family History  Family history unknown: Yes    Prior to Admission medications   Medication Sig Start Date End Date Taking? Authorizing Provider  Cysteamine Bitartrate (PROCYSBI) 300 MG PACK Use 1 each 3 (three) times daily Use as instructed. ONE TOUCH DELICA LANCETS E11.9 Patient not taking: Reported on 08/06/2022 09/11/20   [provider]  dapagliflozin propanediol (FARXIGA) 10 MG TABS tablet Take 1 tablet (10 mg total) by mouth daily before breakfast. 03/30/22   Delma Freeze, FNP  furosemide (LASIX) 40 MG tablet Take 1 tablet (40 mg total) by mouth 2 (two) times daily. 04/01/21   Clarisa Kindred A, FNP  insulin aspart protamine - aspart (NOVOLOG MIX 70/30 FLEXPEN) (  70-30) 100 UNIT/ML FlexPen Inject 0.1 mLs (10 Units total) into the skin 2 (two) times daily. 01/15/19   Auburn Bilberry, MD  isosorbide mononitrate (IMDUR) 30 MG 24 hr tablet Take 1 tablet (30 mg total) by mouth daily. 03/30/22   Delma Freeze, FNP  metoprolol succinate (TOPROL-XL) 50 MG 24 hr tablet Take 1 tablet (50 mg total) by mouth daily. Take with or immediately following a meal. 03/30/22   Clarisa Kindred A, FNP  potassium chloride SA (KLOR-CON M) 20 MEQ tablet Take by mouth as needed.  With lasix Patient not taking: Reported on 08/06/2022 01/02/22   [provider]  sacubitril-valsartan (ENTRESTO) 49-51 MG Take 1 tablet by mouth 2 (two) times daily. 03/30/22   Delma Freeze, FNP  spironolactone (ALDACTONE) 25 MG tablet Take 0.5 tablets (12.5 mg total) by mouth daily. 03/30/22   Delma Freeze, FNP    Physical Exam: Vitals:   05/19/23 1331 05/19/23 1333 05/19/23 1334  BP:  116/78   Pulse:  (!) 107   Resp:  20   Temp: 98.4 F (36.9 C)    SpO2:  96%   Weight:   127 kg  Height:   6' 5.5" (1.969 m)   Physical Exam Vitals and nursing note reviewed.  Constitutional:      General: Gary Bentley is not in acute distress.    Appearance: Gary Bentley is obese. Gary Bentley is not toxic-appearing.  HENT:     Head: Normocephalic and atraumatic.     Mouth/Throat:     Mouth: Mucous membranes are moist.     Pharynx: Oropharynx is clear.  Eyes:     Conjunctiva/sclera: Conjunctivae normal.     Pupils: Pupils are equal, round, and reactive to light.  Cardiovascular:     Rate and Rhythm: Normal rate and regular rhythm.     Heart sounds: No murmur heard.    No gallop.  Pulmonary:     Effort: Pulmonary effort is normal. No respiratory distress.     Breath sounds: Normal breath sounds. No wheezing, rhonchi or rales.  Abdominal:     General: Bowel sounds are normal. There is no distension.     Palpations: Abdomen is soft.     Tenderness: There is no abdominal tenderness.  Skin:    General: Skin is warm and dry.     Comments: Induration, erythema and swelling noted of the right lower extremity extending to the superior portion of the calf.  Right great toe ulcer wrapped with gauze, however see pictures below.  Neurological:     General: No focal deficit present.     Mental Status: Gary Bentley is alert and oriented to person, place, and time. Mental status is at baseline.  Psychiatric:        Mood and Affect: Mood normal.        Behavior: Behavior normal.          Data Reviewed: CBC with WBC of  7.9, hemoglobin 10.9, MCV of 98 and platelets of 392 BMP with sodium 137, potassium 3.6, bicarb 23, glucose 134, BUN 19, creatinine 1.44 with GFR 59  DG Foot Complete Right  Result Date: 05/19/2023 CLINICAL DATA:  Great toe wound. EXAM: RIGHT FOOT COMPLETE - 3+ VIEW COMPARISON:  Radiographs of September 04, 2017.  MRI of May 05, 2023. FINDINGS: There appears to be lytic destruction involving the distal portion of the first proximal phalanx consistent with osteomyelitis. This results in a comminuted pathologic fracture. Dorsal displacement of the first distal phalanx is  noted. IMPRESSION: Comminuted pathologic fracture seen involving distal portion of first proximal phalanx secondary to osteomyelitis. Electronically Signed   By: Lupita Raider M.D.   On: 05/19/2023 16:31   US Venous Img Lower Unilateral Right  Result Date: 05/19/2023 CLINICAL DATA:  Right lower extremity pain and swelling EXAM: RIGHT LOWER EXTREMITY VENOUS DOPPLER ULTRASOUND TECHNIQUE: Gray-scale sonography with compression, as well as color and duplex ultrasound, were performed to evaluate the deep venous system(s) from the level of the common femoral vein through the popliteal and proximal calf veins. COMPARISON:  None Available. FINDINGS: VENOUS Normal compressibility of the common femoral, superficial femoral, and popliteal veins, as well as the visualized calf veins. Visualized portions of profunda femoral vein and great saphenous vein unremarkable. No filling defects to suggest DVT on grayscale or color Doppler imaging. Doppler waveforms show normal direction of venous flow, normal respiratory plasticity and response to augmentation. Limited views of the contralateral common femoral vein are unremarkable. OTHER Enlarged right inguinal lymph nodes the largest measuring 4.7 x 3.4 x 1.7 cm with fatty hilum. Limitations: none IMPRESSION: 1. No right lower extremity DVT. 2. Enlarged right inguinal lymph nodes. Electronically Signed   By:  Larose Hires D.O.   On: 05/19/2023 14:56    Results are pending, will review when available.  Assessment and Plan: * Acute osteomyelitis [facility]) Per chart review, patient has been battling with an ulcer and cellulitis of the right great toe since February 2024.  Gary Bentley has completed a course of cefdinir (February 2024) and Augmentin (course of Augmentin completed 04/29/2023).  Previous culture data demonstrated MSSA, Morganella and Proteus.  MRI was obtained on 6/12 that demonstrated nonspecific marrow edema of the great toe suspicious for osteomyelitis in addition to cellulitis.   - Podiatry consulted; appreciate their recommendations - Plan for surgery tomorrow - N.p.o. after midnight - Start ceftriaxone, vancomycin and Flagyl - Norco and Dilaudid for pain control - ABIs ordered.  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  Patient currently on 70/3010 units twice daily and Farxiga.  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  Gary Bentley follows with the heart failure clinic with last follow-up in December 2023 at which time Gary Bentley was considered stable.  Appears euvolemic at this time.  Per medication reconciliation, patient has not refilled any of his GDMT since January 2024.  Will hold off on restarting preoperatively pending cardiology evaluation.  - Cardiology messaged regarding clearance for surgery given severity of HFrEF  CKD (chronic kidney disease) stage 2, GFR 60-89 ml/min History of CKD stage II with creatinine ranging between 1.3 and 1.5.  Renal function currently at baseline.  Essential hypertension - Continue home regimen  Normocytic anemia History of normocytic  anemia dating back to February 2020, and has remained relatively stable, although hemoglobin was 13.4 in May 2023 and is currently 10.9.  - Recommend outpatient follow-up with PCP for further evaluation, and consideration of colonoscopy  Advance Care Planning:   Code Status: Full Code verified by patient  Consults: Podiatry  Family Communication: No family at bedside  Severity of Illness: The appropriate patient status for this patient is OBSERVATION. Observation status is judged to be reasonable and  necessary in order to provide the required intensity of service to ensure the patient's safety. The patient's presenting symptoms, physical exam findings, and initial radiographic and laboratory data in the context of their medical condition is felt to place them at decreased risk for further clinical deterioration. Furthermore, it is anticipated that the patient will be medically stable for discharge from the hospital within 2 midnights of admission.   Author: Verdene Lennert, MD 05/19/2023 6:52 PM  For on call review [email or web address].

## 2023-05-19 NOTE — Consult Note (Signed)
PHARMACY - BRIEF ANTIBIOTIC NOTE   Pharmacy has received consult(s) for vancomycin from an ED provider. The patient's profile has been reviewed for ht/wt/allergies/indication/available labs.    One time order(s) placed for: Vancomycin 2500 mg IV x 1 (ordered as vancomycin 1250 mg x 2)  Further antibiotics/pharmacy consults should be ordered by admitting physician if indicated.                       Thank you,  Will M. Dareen Piano, PharmD PGY-1 Pharmacy Resident 05/19/2023 5:48 PM

## 2023-05-19 NOTE — Assessment & Plan Note (Addendum)
Per chart review, patient has been battling with an ulcer and cellulitis of the right great toe since February 2024.  He has completed a course of cefdinir (February 2024) and Augmentin (course of Augmentin completed 04/29/2023).  Previous culture data demonstrated MSSA, Morganella and Proteus.  MRI was obtained on 6/12 that demonstrated nonspecific marrow edema of the great toe suspicious for osteomyelitis in addition to cellulitis.   - Podiatry consulted; appreciate their recommendations - Plan for surgery tomorrow - N.p.o. after midnight - Start ceftriaxone, vancomycin and Flagyl - Norco and Dilaudid for pain control - ABIs ordered.  If evidence of PAD, will likely need vascular consult

## 2023-05-19 NOTE — Assessment & Plan Note (Signed)
Last A1c approximately 7 months ago was 8.3%.  Patient currently on 70/3010 units twice daily and Farxiga.  - Hold home antiglycemic agents - A1c pending - SSI, moderate - Semglee 5 units at bedtime

## 2023-05-19 NOTE — ED Provider Notes (Addendum)
[facility] Emergency Department Provider Note     Event Date/Time   First MD Initiated Contact with Patient 05/19/23 1530     (approximate)   History   Wound Infection   HPI  Gary Bentley is a 52 y.o. male with a history of DM, diabetic foot ulcer, diabetic neuropathy, and CHF, presents to the ED for evaluation of his right foot wound.  He would confirm a recent diagnosis of osteomyelitis of the great toe of the right foot. Chart review reveals a visit yesterday with Dr. Gwyneth Revels, where the patient had a wound culture collected on his right great toe wound.  Has been following the patient for the last 2 months for this persistent wound to the right great foot.  Patient had MRI performed 12 days ago which confirms bony changes consistent with osteomyelitis.  He presents to the ED today for admission for expected surgery.   Physical Exam   Triage Vital Signs: ED Triage Vitals  Enc Vitals Group     BP 05/19/23 1333 116/78     Pulse Rate 05/19/23 1333 (!) 107     Resp 05/19/23 1333 20     Temp 05/19/23 1331 98.4 F (36.9 C)     Temp src --      SpO2 05/19/23 1333 96 %     Weight 05/19/23 1334 280 lb (127 kg)     Height 05/19/23 1334 6' 5.5" (1.969 m)     Head Circumference --      Peak Flow --      Pain Score 05/19/23 1334 4     Pain Loc --      Pain Edu? --      Excl. in GC? --     Most recent vital signs: Vitals:   05/19/23 1331 05/19/23 1333  BP:  116/78  Pulse:  (!) 107  Resp:  20  Temp: 98.4 F (36.9 C)   SpO2:  96%    General Awake, no distress. NAD HEENT NCAT. PERRL. EOMI. No rhinorrhea. Mucous membranes are moist.  CV:  Good peripheral perfusion.  RESP:  Normal effort.  ABD:  No distention.  MSK:  Right great toe with a large plantar surface wound with extension into the subcu tissues. (See associated images)     ED Results / Procedures / Treatments   Labs (all labs ordered are listed, but only abnormal results  are displayed) Labs Reviewed  CBC - Abnormal; Notable for the following components:      Result Value   RBC 3.56 (*)    Hemoglobin 10.9 (*)    HCT 35.0 (*)    All other components within normal limits  BASIC METABOLIC PANEL - Abnormal; Notable for the following components:   Glucose, Bld 134 (*)    Creatinine, Ser 1.44 (*)    GFR, Estimated 59 (*)    All other components within normal limits     EKG   RADIOLOGY  I personally viewed and evaluated these images as part of my medical decision making, as well as reviewing the written report by the radiologist.  ED Provider Interpretation: no DVT noted.  DG Foot Complete Right  Result Date: 05/19/2023 CLINICAL DATA:  Great toe wound. EXAM: RIGHT FOOT COMPLETE - 3+ VIEW COMPARISON:  Radiographs of September 04, 2017.  MRI of May 05, 2023. FINDINGS: There appears to be lytic destruction involving the distal portion of the first proximal phalanx consistent with osteomyelitis. This results  in a comminuted pathologic fracture. Dorsal displacement of the first distal phalanx is noted. IMPRESSION: Comminuted pathologic fracture seen involving distal portion of first proximal phalanx secondary to osteomyelitis. Electronically Signed   By: Lupita Raider M.D.   On: 05/19/2023 16:31   US Venous Img Lower Unilateral Right  Result Date: 05/19/2023 CLINICAL DATA:  Right lower extremity pain and swelling EXAM: RIGHT LOWER EXTREMITY VENOUS DOPPLER ULTRASOUND TECHNIQUE: Gray-scale sonography with compression, as well as color and duplex ultrasound, were performed to evaluate the deep venous system(s) from the level of the common femoral vein through the popliteal and proximal calf veins. COMPARISON:  None Available. FINDINGS: VENOUS Normal compressibility of the common femoral, superficial femoral, and popliteal veins, as well as the visualized calf veins. Visualized portions of profunda femoral vein and great saphenous vein unremarkable. No filling defects  to suggest DVT on grayscale or color Doppler imaging. Doppler waveforms show normal direction of venous flow, normal respiratory plasticity and response to augmentation. Limited views of the contralateral common femoral vein are unremarkable. OTHER Enlarged right inguinal lymph nodes the largest measuring 4.7 x 3.4 x 1.7 cm with fatty hilum. Limitations: none IMPRESSION: 1. No right lower extremity DVT. 2. Enlarged right inguinal lymph nodes. Electronically Signed   By: Larose Hires D.O.   On: 05/19/2023 14:56     PROCEDURES:  Critical Care performed: Yes  CRITICAL CARE Performed by: Lissa Hoard   Total critical care time: 30 minutes  Critical care time was exclusive of separately billable procedures and treating other patients.  Critical care was necessary to treat or prevent imminent or life-threatening deterioration.  Critical care was time spent personally by me on the following activities: development of treatment plan with patient and/or surrogate as well as nursing, discussions with consultants, evaluation of patient's response to treatment, examination of patient, obtaining history from patient or surrogate, ordering and performing treatments and interventions, ordering and review of laboratory studies, ordering and review of radiographic studies, pulse oximetry and re-evaluation of patient's condition.   Procedures   MEDICATIONS ORDERED IN ED: Medications  HYDROcodone-acetaminophen (NORCO/VICODIN) 5-325 MG per tablet 1 tablet (1 tablet Oral Given 05/19/23 1616)     IMPRESSION / MDM / ASSESSMENT AND PLAN / ED COURSE  I reviewed the triage vital signs and the nursing notes.                              Differential diagnosis includes, but is not limited to, cellulitis, diabetic foot ulcer, osteomyelitis, venous stasis wound, PAD  Patient's presentation is most consistent with acute presentation with potential threat to life or bodily  function.  ----------------------------------------- 4:16 PM on 05/19/2023 ----------------------------------------- Communicated with Dr. Ether Griffins via secure chat related to the patient.  He plans on additional vascular studies prior to surgical intervention which will be performed no earlier than tomorrow.  Dr. Huel Cote will admit the patient to the hospital service as discussed.  Patient's diagnosis is consistent with acute osteomyelitis of the right great toe, with the logic, comminuted distal phalanx fracture. Patient will be made to the hospital service for expected surgical intervention by podiatry. Patient is agreeable to plan of admission and surgical intervention as discussed.    FINAL CLINICAL IMPRESSION(S) / ED DIAGNOSES   Final diagnoses:  Other acute osteomyelitis of right foot (HCC)  Cellulitis of toe of right foot     Rx / DC Orders   ED Discharge Orders  None        Note:  This document was prepared using Dragon voice recognition software and may include unintentional dictation errors.    Lissa Hoard, PA-C 05/19/23 1719    Lissa Hoard, PA-C 05/19/23 1734    Dionne Bucy, MD 05/19/23 [address] PA-C 05/19/23 2220    Dionne Bucy, MD 05/25/23 [phone]

## 2023-05-19 NOTE — Consult Note (Signed)
Pharmacy Antibiotic Note  Dylan Monforte is a 52 y.o. male admitted on 05/19/2023 with OM. PMH significant for HFrEF, CKD2, HTN, TUD, DM. Recent MRI concerning for OM of R great toe. Cultures of foot from 04/22/2023 grew Staph aureus, Morganella morganii, and Proteus vulgaris. Podiatry planning amputation 6/27. Pharmacy has been consulted for vancomycin dosing.  Plan: Day 1 of antibiotics Completed vancomycin 2500 mg IV x1 in ED Start vancomycin 1500 mg IV Q12H. Goal AUC 400-550. Expected AUC: 472.6 Expected Css min: 14.0 SCr used: 1.44  Weight used: IBW, Vd used: 0.72 (BMI 32.7) Patient is also on ceftriaxone 2 g IV Q24H and metronidazole 500 mg Q12H Continue to monitor renal function and follow culture results   Height: 6' 5.5" (196.9 cm) Weight: 127 kg (280 lb) IBW/kg (Calculated) : 90.25  Temp (24hrs), Avg:98.4 F (36.9 C), Min:98.4 F (36.9 C), Max:98.4 F (36.9 C)  Recent Labs  Lab 05/19/23 1334  WBC 7.9  CREATININE 1.44*    Estimated Creatinine Clearance: 91.1 mL/min (A) (by C-G formula based on SCr of 1.44 mg/dL (H)).    Allergies  Allergen Reactions   Bee Venom Anaphylaxis    Antimicrobials this admission: 6/26 Vancomycin >>  6/26 Ceftriaxone >>  6/26 Metronidazole >>  Dose adjustments this admission: N/A  Microbiology results: None ordered  Thank you for allowing pharmacy to be a part of this patient's care.  Celene Squibb, PharmD Clinical Pharmacist 05/19/2023 7:04 PM

## 2023-05-19 NOTE — ED Triage Notes (Signed)
Pt to ED from podiatrist for possible DVT to right calf and wound check to right big toe.  Reports swelling and pain to right calf for 1 month

## 2023-05-19 NOTE — Assessment & Plan Note (Signed)
-   Continue home regimen 

## 2023-05-20 ENCOUNTER — Encounter
Admission: EM | Disposition: A | Discharge status: Home / Self Care | Payer: Self-pay | Source: Home / Self Care | Attending: Internal Medicine

## 2023-05-20 ENCOUNTER — Inpatient Hospital Stay: Payer: BC Managed Care – PPO | Admitting: Anesthesiology

## 2023-05-20 ENCOUNTER — Encounter: Payer: Self-pay | Admitting: Internal Medicine

## 2023-05-20 ENCOUNTER — Observation Stay: Payer: BC Managed Care – PPO

## 2023-05-20 DIAGNOSIS — Z91030 Bee allergy status: Secondary | ICD-10-CM | POA: Diagnosis not present

## 2023-05-20 DIAGNOSIS — E669 Obesity, unspecified: Secondary | ICD-10-CM | POA: Diagnosis present

## 2023-05-20 DIAGNOSIS — D631 Anemia in chronic kidney disease: Secondary | ICD-10-CM | POA: Diagnosis present

## 2023-05-20 DIAGNOSIS — E1169 Type 2 diabetes mellitus with other specified complication: Secondary | ICD-10-CM | POA: Diagnosis present

## 2023-05-20 DIAGNOSIS — Z89422 Acquired absence of other left toe(s): Secondary | ICD-10-CM | POA: Diagnosis not present

## 2023-05-20 DIAGNOSIS — E11621 Type 2 diabetes mellitus with foot ulcer: Secondary | ICD-10-CM | POA: Diagnosis present

## 2023-05-20 DIAGNOSIS — I428 Other cardiomyopathies: Secondary | ICD-10-CM | POA: Diagnosis present

## 2023-05-20 DIAGNOSIS — L03115 Cellulitis of right lower limb: Secondary | ICD-10-CM | POA: Diagnosis present

## 2023-05-20 DIAGNOSIS — I130 Hypertensive heart and chronic kidney disease with heart failure and stage 1 through stage 4 chronic kidney disease, or unspecified chronic kidney disease: Secondary | ICD-10-CM | POA: Diagnosis present

## 2023-05-20 DIAGNOSIS — I272 Pulmonary hypertension, unspecified: Secondary | ICD-10-CM | POA: Diagnosis present

## 2023-05-20 DIAGNOSIS — L97519 Non-pressure chronic ulcer of other part of right foot with unspecified severity: Secondary | ICD-10-CM | POA: Diagnosis present

## 2023-05-20 DIAGNOSIS — Z6836 Body mass index (BMI) 36.0-36.9, adult: Secondary | ICD-10-CM | POA: Diagnosis not present

## 2023-05-20 DIAGNOSIS — N182 Chronic kidney disease, stage 2 (mild): Secondary | ICD-10-CM | POA: Diagnosis present

## 2023-05-20 DIAGNOSIS — M8610 Other acute osteomyelitis, unspecified site: Secondary | ICD-10-CM | POA: Diagnosis not present

## 2023-05-20 DIAGNOSIS — Z91148 Patient's other noncompliance with medication regimen for other reason: Secondary | ICD-10-CM | POA: Diagnosis not present

## 2023-05-20 DIAGNOSIS — M86171 Other acute osteomyelitis, right ankle and foot: Secondary | ICD-10-CM | POA: Diagnosis present

## 2023-05-20 DIAGNOSIS — Z794 Long term (current) use of insulin: Secondary | ICD-10-CM | POA: Diagnosis not present

## 2023-05-20 DIAGNOSIS — I5022 Chronic systolic (congestive) heart failure: Secondary | ICD-10-CM | POA: Diagnosis present

## 2023-05-20 DIAGNOSIS — E1140 Type 2 diabetes mellitus with diabetic neuropathy, unspecified: Secondary | ICD-10-CM | POA: Diagnosis present

## 2023-05-20 DIAGNOSIS — Z79899 Other long term (current) drug therapy: Secondary | ICD-10-CM | POA: Diagnosis not present

## 2023-05-20 DIAGNOSIS — Z87891 Personal history of nicotine dependence: Secondary | ICD-10-CM | POA: Diagnosis not present

## 2023-05-20 DIAGNOSIS — E119 Type 2 diabetes mellitus without complications: Secondary | ICD-10-CM | POA: Diagnosis not present

## 2023-05-20 DIAGNOSIS — Z791 Long term (current) use of non-steroidal anti-inflammatories (NSAID): Secondary | ICD-10-CM | POA: Diagnosis not present

## 2023-05-20 DIAGNOSIS — L03031 Cellulitis of right toe: Secondary | ICD-10-CM | POA: Diagnosis present

## 2023-05-20 HISTORY — PX: AMPUTATION TOE: SHX6595

## 2023-05-20 LAB — CBC
HCT: 31.7 % — ABNORMAL LOW (ref 39.0–52.0)
Hemoglobin: 9.7 g/dL — ABNORMAL LOW (ref 13.0–17.0)
MCH: 30.7 pg (ref 26.0–34.0)
MCHC: 30.6 g/dL (ref 30.0–36.0)
MCV: 100.3 fL — ABNORMAL HIGH (ref 80.0–100.0)
Platelets: 375 10*3/uL (ref 150–400)
RBC: 3.16 MIL/uL — ABNORMAL LOW (ref 4.22–5.81)
RDW: 12.8 % (ref 11.5–15.5)
WBC: 7.8 10*3/uL (ref 4.0–10.5)
nRBC: 0 % (ref 0.0–0.2)

## 2023-05-20 LAB — GLUCOSE, CAPILLARY
Glucose-Capillary: 79 mg/dL (ref 70–99)
Glucose-Capillary: 73 mg/dL (ref 70–99)
Glucose-Capillary: 131 mg/dL — ABNORMAL HIGH (ref 70–99)
Glucose-Capillary: 114 mg/dL — ABNORMAL HIGH (ref 70–99)

## 2023-05-20 LAB — CBG MONITORING, ED
Glucose-Capillary: 113 mg/dL — ABNORMAL HIGH (ref 70–99)
Glucose-Capillary: 110 mg/dL — ABNORMAL HIGH (ref 70–99)

## 2023-05-20 LAB — LIPID PANEL
Cholesterol: 153 mg/dL (ref 0–200)
HDL: 36 mg/dL — ABNORMAL LOW (ref >40)
LDL Cholesterol: 101 mg/dL — ABNORMAL HIGH (ref 0–99)
Total CHOL/HDL Ratio: 4.3 RATIO
Triglycerides: 81 mg/dL (ref <150)
VLDL: 16 mg/dL (ref 0–40)

## 2023-05-20 LAB — C-REACTIVE PROTEIN: CRP: 7 mg/dL — ABNORMAL HIGH (ref <1.0)

## 2023-05-20 LAB — BASIC METABOLIC PANEL
Anion gap: 6 (ref 5–15)
BUN: 16 mg/dL (ref 6–20)
CO2: 23 mmol/L (ref 22–32)
Calcium: 8.5 mg/dL — ABNORMAL LOW (ref 8.9–10.3)
Chloride: 108 mmol/L (ref 98–111)
Creatinine, Ser: 1.27 mg/dL — ABNORMAL HIGH (ref 0.61–1.24)
GFR, Estimated: >60 mL/min (ref >60)
Glucose, Bld: 101 mg/dL — ABNORMAL HIGH (ref 70–99)
Potassium: 3.5 mmol/L (ref 3.5–5.1)
Sodium: 137 mmol/L (ref 135–145)

## 2023-05-20 LAB — SEDIMENTATION RATE: Sed Rate: 96 mm/hr — ABNORMAL HIGH (ref 0–20)

## 2023-05-20 LAB — HIV ANTIBODY (ROUTINE TESTING W REFLEX): HIV Screen 4th Generation wRfx: NONREACTIVE

## 2023-05-20 SURGERY — AMPUTATION, TOE
Anesthesia: General | Site: Toe | Laterality: Right

## 2023-05-20 MED ORDER — PROPOFOL 10 MG/ML IV BOLUS
INTRAVENOUS | Status: DC | PRN
  Administered 2023-05-20: 150 mg via INTRAVENOUS

## 2023-05-20 MED ORDER — BUPIVACAINE HCL (PF) 0.5 % IJ SOLN
INTRAMUSCULAR | Status: AC
  Filled 2023-05-20: qty 30

## 2023-05-20 MED ORDER — ASPIRIN 81 MG PO CHEW
81.0000 mg | CHEWABLE_TABLET | Freq: Every day | ORAL | Status: DC
  Administered 2023-05-20 – 2023-05-22 (×3): 81 mg via ORAL
  Filled 2023-05-20 (×3): qty 1

## 2023-05-20 MED ORDER — OXYCODONE HCL 5 MG PO TABS
ORAL_TABLET | ORAL | Status: AC
  Filled 2023-05-20: qty 1

## 2023-05-20 MED ORDER — POTASSIUM CHLORIDE CRYS ER 20 MEQ PO TBCR
40.0000 meq | EXTENDED_RELEASE_TABLET | Freq: Once | ORAL | Status: DC
  Filled 2023-05-20: qty 2

## 2023-05-20 MED ORDER — LACTATED RINGERS IV SOLN: INTRAVENOUS | Status: DC | PRN

## 2023-05-20 MED ORDER — DEXTROSE 50 % IV SOLN
INTRAVENOUS | Status: AC
  Filled 2023-05-20: qty 50

## 2023-05-20 MED ORDER — PIPERACILLIN-TAZOBACTAM 3.375 G IVPB
3.3750 g | Freq: Three times a day (TID) | INTRAVENOUS | Status: DC
  Administered 2023-05-20 – 2023-05-22 (×5): 3.375 g via INTRAVENOUS
  Filled 2023-05-20 (×5): qty 50

## 2023-05-20 MED ORDER — OXYCODONE HCL 5 MG/5ML PO SOLN: 5.0000 mg | Freq: Once | ORAL | Status: AC | PRN

## 2023-05-20 MED ORDER — DEXTROSE 50 % IV SOLN
12.5000 g | Freq: Once | INTRAVENOUS | Status: AC
  Administered 2023-05-20: 12.5 g via INTRAVENOUS

## 2023-05-20 MED ORDER — PROPOFOL 10 MG/ML IV BOLUS
INTRAVENOUS | Status: AC
  Filled 2023-05-20: qty 20

## 2023-05-20 MED ORDER — OXYCODONE HCL 5 MG PO TABS
5.0000 mg | ORAL_TABLET | Freq: Once | ORAL | Status: AC | PRN
  Administered 2023-05-20: 5 mg via ORAL

## 2023-05-20 MED ORDER — FENTANYL CITRATE (PF) 100 MCG/2ML IJ SOLN: 25.0000 ug | INTRAMUSCULAR | Status: DC | PRN

## 2023-05-20 MED ORDER — FENTANYL CITRATE (PF) 100 MCG/2ML IJ SOLN
INTRAMUSCULAR | Status: AC
  Filled 2023-05-20: qty 2

## 2023-05-20 MED ORDER — BUPIVACAINE HCL 0.5 % IJ SOLN
INTRAMUSCULAR | Status: DC | PRN
  Administered 2023-05-20: 10 mL

## 2023-05-20 MED ORDER — FENTANYL CITRATE (PF) 100 MCG/2ML IJ SOLN
INTRAMUSCULAR | Status: DC | PRN
  Administered 2023-05-20 (×2): 25 ug via INTRAVENOUS

## 2023-05-20 MED ORDER — PHENYLEPHRINE 80 MCG/ML (10ML) SYRINGE FOR IV PUSH (FOR BLOOD PRESSURE SUPPORT)
PREFILLED_SYRINGE | INTRAVENOUS | Status: DC | PRN
  Administered 2023-05-20: 160 ug via INTRAVENOUS

## 2023-05-20 MED ORDER — 0.9 % SODIUM CHLORIDE (POUR BTL) OPTIME
TOPICAL | Status: DC | PRN
  Administered 2023-05-20: 500 mL

## 2023-05-20 MED ORDER — ATORVASTATIN CALCIUM 20 MG PO TABS
40.0000 mg | ORAL_TABLET | Freq: Every day | ORAL | Status: DC
  Administered 2023-05-20 – 2023-05-22 (×3): 40 mg via ORAL
  Filled 2023-05-20 (×3): qty 2

## 2023-05-20 MED ORDER — METOPROLOL SUCCINATE ER 25 MG PO TB24
12.5000 mg | ORAL_TABLET | Freq: Every day | ORAL | Status: DC
  Administered 2023-05-20 – 2023-05-21 (×2): 12.5 mg via ORAL
  Filled 2023-05-20 (×2): qty 1

## 2023-05-20 MED ORDER — LIDOCAINE HCL (PF) 1 % IJ SOLN
INTRAMUSCULAR | Status: AC
  Filled 2023-05-20: qty 30

## 2023-05-20 MED ORDER — LIDOCAINE HCL (CARDIAC) PF 100 MG/5ML IV SOSY
PREFILLED_SYRINGE | INTRAVENOUS | Status: DC | PRN
  Administered 2023-05-20: 100 mg via INTRAVENOUS

## 2023-05-20 MED ORDER — MIDAZOLAM HCL 2 MG/2ML IJ SOLN
INTRAMUSCULAR | Status: AC
  Filled 2023-05-20: qty 2

## 2023-05-20 MED ORDER — MIDAZOLAM HCL 2 MG/2ML IJ SOLN
INTRAMUSCULAR | Status: DC | PRN
  Administered 2023-05-20: 2 mg via INTRAVENOUS

## 2023-05-20 MED ORDER — ONDANSETRON HCL 4 MG/2ML IJ SOLN
INTRAMUSCULAR | Status: DC | PRN
  Administered 2023-05-20: 4 mg via INTRAVENOUS

## 2023-05-20 SURGICAL SUPPLY — 37 items
BLADE SURG MINI STRL (BLADE) ×1 IMPLANT
BNDG CMPR 75X21 PLY HI ABS (MISCELLANEOUS) ×1
BNDG CMPR STD VLCR NS LF 5.8X4 (GAUZE/BANDAGES/DRESSINGS) ×1
BNDG ELASTIC 4X5.8 VLCR NS LF (GAUZE/BANDAGES/DRESSINGS) ×1 IMPLANT
BNDG ESMARCH 4 X 12 STRL LF (GAUZE/BANDAGES/DRESSINGS) ×1
BNDG ESMARCH 4X12 STRL LF (GAUZE/BANDAGES/DRESSINGS) ×1 IMPLANT
BNDG GAUZE DERMACEA FLUFF 4 (GAUZE/BANDAGES/DRESSINGS) ×1 IMPLANT
BNDG GZE 12X3 1 PLY HI ABS (GAUZE/BANDAGES/DRESSINGS) ×2
BNDG GZE DERMACEA 4 6PLY (GAUZE/BANDAGES/DRESSINGS) ×1
BNDG STRETCH GAUZE 3IN X12FT (GAUZE/BANDAGES/DRESSINGS) ×2 IMPLANT
ELECT REM PT RETURN 9FT ADLT (ELECTROSURGICAL) ×1
ELECTRODE REM PT RTRN 9FT ADLT (ELECTROSURGICAL) ×1 IMPLANT
GAUZE SPONGE 4X4 12PLY STRL (GAUZE/BANDAGES/DRESSINGS) ×1 IMPLANT
GAUZE STRETCH 2X75IN STRL (MISCELLANEOUS) ×1 IMPLANT
GAUZE XEROFORM 1X8 LF (GAUZE/BANDAGES/DRESSINGS) ×1 IMPLANT
GLOVE BIO SURGEON STRL SZ7.5 (GLOVE) ×1 IMPLANT
GLOVE INDICATOR 8.0 STRL GRN (GLOVE) ×1 IMPLANT
GOWN STRL REUS W/ TWL XL LVL3 (GOWN DISPOSABLE) ×2 IMPLANT
GOWN STRL REUS W/TWL XL LVL3 (GOWN DISPOSABLE) ×2
KIT TURNOVER KIT A (KITS) ×1 IMPLANT
LABEL OR SOLS (LABEL) ×1 IMPLANT
MANIFOLD NEPTUNE II (INSTRUMENTS) ×1 IMPLANT
NDL FILTER BLUNT 18X1 1/2 (NEEDLE) ×1 IMPLANT
NDL HYPO 25X1 1.5 SAFETY (NEEDLE) ×1 IMPLANT
NEEDLE FILTER BLUNT 18X1 1/2 (NEEDLE) ×1 IMPLANT
NEEDLE HYPO 25X1 1.5 SAFETY (NEEDLE) ×1 IMPLANT
NS IRRIG 500ML POUR BTL (IV SOLUTION) ×1 IMPLANT
PACK EXTREMITY ARMC (MISCELLANEOUS) ×1 IMPLANT
PAD ABD DERMACEA PRESS 5X9 (GAUZE/BANDAGES/DRESSINGS) ×2 IMPLANT
SHIELD FULL FACE ANTIFOG 7M (MISCELLANEOUS) ×1 IMPLANT
STOCKINETTE M/LG 89821 (MISCELLANEOUS) ×1 IMPLANT
STRAP SAFETY 5IN WIDE (MISCELLANEOUS) ×1 IMPLANT
SUT ETHILON 3-0 FS-10 30 BLK (SUTURE) ×1
SUTURE EHLN 3-0 FS-10 30 BLK (SUTURE) ×1 IMPLANT
SYR 10ML LL (SYRINGE) ×3 IMPLANT
TRAP FLUID SMOKE EVACUATOR (MISCELLANEOUS) ×1 IMPLANT
WATER STERILE IRR 500ML POUR (IV SOLUTION) ×1 IMPLANT

## 2023-05-20 NOTE — Consult Note (Addendum)
[facility]INIC CARDIOLOGY CONSULT NOTE       Patient ID: Gary Bentley MRN: [id number] DOB/AGE: 1971/09/29 52 y.o.  Admit date: 05/19/2023 Referring Physician Dr. Verdene Lennert  Primary Physician   Primary Cardiologist Dr. Juliann Pares Reason for Consultation pre-operative medical optimization   HPI: Gary Bentley is a 52yoM with a PMH of NICM (EF 30% 05/2021), DM2 with neuropathy, HTN, PAD (hx L toe amputation 2021), hx tobacco use who presented to [facility] ED 05/19/23 from his podiatrist office with right leg swelling and wound infection of his right toe.  Cardiology is consulted for medical optimization prior to anticipated toe amputation.  The patient states that he is having ongoing infection and his right great toe for several months for which she is been on multiple antibiotic courses prescribed by his podiatrist on an outpatient basis.  Over the last month or so, the redness and swelling in his right toe has expanded up to his ankle and calf with associated foul-smelling discharge from his great toe.  He was at his podiatrist office yesterday who was concerned about his right leg swelling and recommended the patient present to the emergency department to rule out DVT and in anticipation of amputation of his toe due to the persistent infection.  He has a cardiac history significant for chronic HFrEF dating back to 2020, where he had an abnormal Lexiscan Myoview and subsequently underwent left heart catheterization which revealed normal coronary arteries and mild pulmonary hypertension.  His ejection fraction in 2020 was as low as 15-20% with diffuse LV HK and severely reduced RV systolic function.  He was followed closely by the heart failure clinic and at regular intervals with Dr. Juliann Pares and was on excellent GDMT of metoprolol, Sherryll Burger, Farxiga, and Lasix but tells me today he has been "out of his medications for couple days."  Per the admitting hospitalist/pharmacy med rec it has been since  February since the patient's last med refill.  At my time of evaluation the patient is laying at slight incline in bed on his phone.  He denies recent or ongoing chest pain, shortness of breath or dyspnea on exertion, heart racing or palpitations, orthopnea, PND, abdominal swelling or peripheral edema other than the ongoing swelling in his right leg from the infection.  He notes a weight gain of 20 to 30 pounds in the past 6 or 7 months but he attributes this to not watching his diet.  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  In the emergency department the patient underwent right lower extremity ultrasound which was negative for DVT but did reveal an enlarged right inguinal lymph nodes. MRI of his right foot from 6/12 showed soft tissue swelling in the great toe C/W cellulitis, with nonspecific marrow edema within the proximal and distal phalanges of the great toe suspicious for osteomyelitis.  His sed rate was elevated to 96.  No leukocytosis with WBC 7.8, chronic anemia with H&H 9.7/31.6 BUN/creatinine around baseline at 16/1.27 and GFR >60.  History of smoking but quit in 2020 when he first understood his EF was low.  Drinks alcohol once yearly.  Review of systems complete and found to be negative unless listed above     Past Medical History:  Diagnosis Date   CHF (congestive heart failure) (HCC)    Diabetes mellitus without complication (HCC)    Hypertension     Past Surgical History:  Procedure Laterality Date   AMPUTATION TOE Left 01/05/2020   Procedure: AMPUTATION TOE  MPJ LEFT;  Surgeon: Gwyneth Revels, DPM;  Location: [facility] ORS;  Service: Podiatry;  Laterality: Left;   FOOT SURGERY     RIGHT/LEFT HEART CATH AND CORONARY ANGIOGRAPHY N/A 01/13/2019   Procedure: RIGHT/LEFT HEART CATH AND CORONARY ANGIOGRAPHY;  Surgeon: Antonieta Iba, MD;  Location: [facility] INVASIVE CV LAB;  Service: Cardiovascular;  Laterality: N/A;    (Not in a hospital admission)  Social History    Socioeconomic History   Marital status: Single    Spouse name: Not on file   Number of children: Not on file   Years of education: Not on file   Highest education level: Not on file  Occupational History   Not on file  Tobacco Use   Smoking status: Former    Packs/day: 1    Types: Cigarettes    Quit date: 04/17/2017    Years since quitting: 6.0   Smokeless tobacco: Never  Vaping Use   Vaping Use: Never used  Substance and Sexual Activity   Alcohol use: No   Drug use: No   Sexual activity: Not Currently  Other Topics Concern   Not on file  Social History Narrative   No place to live at this time, independent otherwise   Social Determinants of Health   Financial Resource Strain: Low Risk  (09/20/2019)   Overall Financial Resource Strain (CARDIA)    Difficulty of Paying Living Expenses: Not hard at all  Food Insecurity: No Food Insecurity (09/20/2019)   Hunger Vital Sign    Worried About Running Out of Food in the Last Year: Never true    Ran Out of Food in the Last Year: Never true  Transportation Needs: No Transportation Needs (09/20/2019)   PRAPARE - Administrator, Civil Service (Medical): No    Lack of Transportation (Non-Medical): No  Physical Activity: Sufficiently Active (08/25/2019)   Exercise Vital Sign    Days of Exercise per Week: 5 days    Minutes of Exercise per Session: 150+ min  Stress: No Stress Concern Present (08/25/2019)   [employer] of Occupational Health - Occupational Stress Questionnaire    Feeling of Stress : Not at all  Social Connections: Unknown (08/25/2019)   Social Connection and Isolation Panel [NHANES]    Frequency of Communication with Friends and Family: Never    Frequency of Social Gatherings with Friends and Family: Never    Attends Religious Services: Never    Database administrator or Organizations: No    Attends Banker Meetings: Never    Marital Status: Not on file  Intimate Partner Violence: Not  At Risk (08/25/2019)   Humiliation, Afraid, Rape, and Kick questionnaire    Fear of Current or Ex-Partner: No    Emotionally Abused: No    Physically Abused: No    Sexually Abused: No    Family History  Family history unknown: Yes      Intake/Output Summary (Last 24 hours) at 05/20/2023 0944 Last data filed at 05/20/2023 0737 Gross per 24 hour  Intake 500 ml  Output 475 ml  Net 25 ml    Vitals:   05/19/23 2202 05/20/23 0243 05/20/23 0245 05/20/23 0708  BP: 136/88 (!) 106/53 (!) 106/53 (!) 106/53  Pulse: 70 (!) 59  (!) 59  Resp: 18 20  20   Temp: 98 F (36.7 C) 97.9 F (36.6 C)  97.9 F (36.6 C)  TempSrc: Oral Oral  Oral  SpO2: 99% 96%  96%  Weight: 127 kg  Height: 6\' 5"  (1.956 m)       PHYSICAL EXAM General: Pleasant middle-aged black male, well nourished, in no acute distress.  Laying at low incline in bed. HEENT:  Normocephalic and atraumatic. Neck:  No JVD.  Lungs: Normal respiratory effort on room air. Clear bilaterally to auscultation. No wheezes, crackles, rhonchi.  Heart: HRRR . Normal S1 and S2 without gallops or murmurs.  Abdomen: Non-distended appearing with excess adiposity.  Msk: Normal strength and tone for age. Extremities: Right lower extremity with swelling and and erythema up to his mid calf, left lower extremity without peripheral edema or swelling. Neuro: Alert and oriented X 3. Psych:  Answers questions appropriately.   Labs: Basic Metabolic Panel: Recent Labs    05/19/23 1334 05/20/23 0412  NA 137 137  K 3.6 3.5  CL 106 108  CO2 23 23  GLUCOSE 134* 101*  BUN 19 16  CREATININE 1.44* 1.27*  CALCIUM 8.9 8.5*   Liver Function Tests: No results for input(s): "AST", "ALT", "ALKPHOS", "BILITOT", "PROT", "ALBUMIN" in the last 72 hours. No results for input(s): "LIPASE", "AMYLASE" in the last 72 hours. CBC: Recent Labs    05/19/23 1334 05/20/23 0412  WBC 7.9 7.8  HGB 10.9* 9.7*  HCT 35.0* 31.7*  MCV 98.3 100.3*  PLT 392 375    Cardiac Enzymes: No results for input(s): "CKTOTAL", "CKMB", "CKMBINDEX", "TROPONINIHS" in the last 72 hours. BNP: No results for input(s): "BNP" in the last 72 hours. D-Dimer: No results for input(s): "DDIMER" in the last 72 hours. Hemoglobin A1C: No results for input(s): "HGBA1C" in the last 72 hours. Fasting Lipid Panel: Recent Labs    05/20/23 0412  CHOL 153  HDL 36*  LDLCALC 101*  TRIG 81  CHOLHDL 4.3   Thyroid Function Tests: No results for input(s): "TSH", "T4TOTAL", "T3FREE", "THYROIDAB" in the last 72 hours.  Invalid input(s): "FREET3" Anemia Panel: No results for input(s): "VITAMINB12", "FOLATE", "FERRITIN", "TIBC", "IRON", "RETICCTPCT" in the last 72 hours.   Radiology: DG Foot Complete Right  Result Date: 05/19/2023 CLINICAL DATA:  Great toe wound. EXAM: RIGHT FOOT COMPLETE - 3+ VIEW COMPARISON:  Radiographs of September 04, 2017.  MRI of May 05, 2023. FINDINGS: There appears to be lytic destruction involving the distal portion of the first proximal phalanx consistent with osteomyelitis. This results in a comminuted pathologic fracture. Dorsal displacement of the first distal phalanx is noted. IMPRESSION: Comminuted pathologic fracture seen involving distal portion of first proximal phalanx secondary to osteomyelitis. Electronically Signed   By: Lupita Raider M.D.   On: 05/19/2023 16:31   US Venous Img Lower Unilateral Right  Result Date: 05/19/2023 CLINICAL DATA:  Right lower extremity pain and swelling EXAM: RIGHT LOWER EXTREMITY VENOUS DOPPLER ULTRASOUND TECHNIQUE: Gray-scale sonography with compression, as well as color and duplex ultrasound, were performed to evaluate the deep venous system(s) from the level of the common femoral vein through the popliteal and proximal calf veins. COMPARISON:  None Available. FINDINGS: VENOUS Normal compressibility of the common femoral, superficial femoral, and popliteal veins, as well as the visualized calf veins. Visualized  portions of profunda femoral vein and great saphenous vein unremarkable. No filling defects to suggest DVT on grayscale or color Doppler imaging. Doppler waveforms show normal direction of venous flow, normal respiratory plasticity and response to augmentation. Limited views of the contralateral common femoral vein are unremarkable. OTHER Enlarged right inguinal lymph nodes the largest measuring 4.7 x 3.4 x 1.7 cm with fatty hilum. Limitations: none IMPRESSION: 1.  No right lower extremity DVT. 2. Enlarged right inguinal lymph nodes. Electronically Signed   By: Larose Hires D.O.   On: 05/19/2023 14:56   MR FOOT RIGHT WO CONTRAST  Result Date: 05/05/2023 CLINICAL DATA:  Diabetic with right great toe ulcer for 2-3 months. No known injury. EXAM: MRI OF THE RIGHT FOREFOOT WITHOUT CONTRAST TECHNIQUE: Multiplanar, multisequence MR imaging of the right forefoot was performed. No intravenous contrast was administered. COMPARISON:  None recent.  Right foot radiographs 09/04/2017. FINDINGS: Bones/Joint/Cartilage The great toe appears mildly hyperextended at the interphalangeal joint, most obvious on the sagittal images. This limits assessment on the axial and coronal images. There is increased T2 marrow signal throughout the proximal and distal phalanges of the great toe as well as suspected decreased T1 signal, especially in the distal phalanx. No definite cortical destruction is identified. There is a small interphalangeal joint effusion. No significant effusion of the metatarsophalangeal joint or abnormality of the 1st metatarsal. No significant abnormalities of the other toes or metatarsals are seen. The alignment is normal at the Lisfranc joint. Ligaments The collateral ligaments of the metatarsophalangeal joints appear intact. The Lisfranc ligament appears intact. Muscles and Tendons Diffuse forefoot muscular fatty atrophy. No significant tenosynovitis. Soft tissues Soft tissue swelling in the great toe with  decreased T1 signal within the plantar and medial subcutaneous tissues at the level of the interphalangeal joint. No focal fluid collection or foreign body identified. IMPRESSION: 1. Soft tissue swelling in the great toe consistent with cellulitis. No focal fluid collection or foreign body identified. 2. Nonspecific marrow edema within the proximal and distal phalanges of the great toe with suspected decreased T1 signal, especially in the distal phalanx, suspicious for osteomyelitis. No cortical destruction is identified. Great toe assessment limited by extension at the interphalangeal joint. Plain film correlation recommended. 3. There is a small interphalangeal joint effusion which could be septic arthritis. 4. No other significant osseous findings. Electronically Signed   By: Carey Bullocks M.D.   On: 05/05/2023 18:28    ECHO 05/2021 ECHOCARDIOGRAPHIC MEASUREMENTS  2D DIMENSIONS  AORTA                  Values   Normal Range   MAIN PA         Values    Normal Range                Annulus: 2.6 cm       [2.3-2.9]         PA Main: nm*       [1.5-2.1]              Aorta Sin: 3.0 cm       [3.1-3.7]    RIGHT VENTRICLE            ST Junction: nm*          [2.6-3.2]         RV Base: nm*       [<4.2]             Asc.Aorta: nm*          [2.6-3.4]          RV Mid: 3.0 cm    [<3.5]  LEFT VENTRICLE                                      RV Length: nm*       [<  8.6]                 LVIDd: 6.1 cm       [4.2-5.9]    INFERIOR VENA CAVA                  LVIDs: 5.2 cm                        Max. IVC: nm*       [<=2.1]                    FS: 15.4 %       [>25]            Min. IVC: nm*                    SWT: 1.1 cm       [0.6-1.0]    ------------------                    PWT: 1.1 cm       [0.6-1.0]    nm* - not measured  LEFT ATRIUM                LA Diam: 4.8 cm       [3.0-4.0]            LA A4C Area: nm*          [<20]             LA Volume: nm*          [18-58]   _________________________________________________________________________________________  ECHOCARDIOGRAPHIC DESCRIPTIONS  AORTIC ROOT                   Size: Normal             Dissection: INDETERM FOR DISSECTION  AORTIC VALVE               Leaflets: Tricuspid                   Morphology: Normal               Mobility: Fully mobile  LEFT VENTRICLE                   Size: MILDLY ENLARGED               Anterior: HYPOCONTRACTILE            Contraction: MOD GLOBAL DECREASE            Lateral: HYPOCONTRACTILE             Closest EF: 30% (Estimated)                 Septal: HYPOCONTRACTILE              LV Masses: No Masses                       Apical: HYPOCONTRACTILE                    LVH: None                          Inferior: HYPOCONTRACTILE  Posterior: HYPOCONTRACTILE           Dias.FxClass: N/A  MITRAL VALVE               Leaflets: Normal                        Mobility: Fully mobile             Morphology: Normal  LEFT ATRIUM                   Size: Normal                       LA Masses: No masses              IA Septum: Normal IAS  MAIN PA                   Size: Normal  PULMONIC VALVE             Morphology: Normal                        Mobility: Fully mobile  RIGHT VENTRICLE              RV Masses: No Masses                         Size: Normal              Free Wall: Normal                     Contraction: Normal  TRICUSPID VALVE               Leaflets: Normal                        Mobility: Fully mobile             Morphology: Normal  RIGHT ATRIUM                   Size: Normal                        RA Other: None                RA Mass: No masses  PERICARDIUM                 Fluid: No effusion  INFERIOR VENACAVA                   Size: Normal Normal respiratory collapse  _________________________________________________________________________________________   DOPPLER ECHO and OTHER SPECIAL PROCEDURES                  Aortic: MILD AR                    No AS                         108.6 cm/sec peak vel      4.7 mmHg peak grad                 Mitral: TRIVIAL MR                 No MS  MV Inflow E Vel = 47.1 cm/sec     MV Annulus E'Vel = 5.3 cm/sec                         E/E'Ratio = 8.9              Tricuspid: TRIVIAL TR                 No TS              Pulmonary: MILD PR                    No PS                         133.0 cm/sec peak vel      7.1 mmHg peak grad  _________________________________________________________________________________________  INTERPRETATION  MODERATE LV SYSTOLIC DYSFUNCTION (See above)  NORMAL RIGHT VENTRICULAR SYSTOLIC FUNCTION  NO VALVULAR STENOSIS  TRIVIAL MR, TR  MILD AR, PR  EF 30%  _________________________________________________________________________________________Electronically signed by    Dorothyann Peng, MD on 06/06/2021 04: 03 PM           Performed By: Merla Riches, RDCS     Ordering Physician: Harl Favor   [facility] 01/13/2019 - Dr.Gollan  Procedural Findings: Hemodynamics:  AO 95/69, 80 LV 95/13, 24  Coronary angiography:  Coronary dominance: Right  Left mainstem: Large vessel that bifurcates into the LAD and left circumflex, no significant disease noted  Left anterior descending (LAD): Large vessel that extends to the apical region, diagonal branch 2 of moderate size, no significant disease noted  Left circumflex (LCx): Large vessel with OM branch 2, no significant disease noted  Right coronary artery (RCA): Right dominant vessel with PL and PDA, no significant disease noted  Left ventriculography: Left ventricular systolic function is severely depressed, global hypokinesis, LVEF is estimated at 20%, there is no significant mitral regurgitation , no significant aortic valve stenosis  RA: 7 mean RV 443/10/17 PA: 40/21/29 PCWP: 19 C/O: 5.04 C.I: 2.07  Final Conclusions:  Nonischemic cardiomyopathy,  no significant CAD Mildly elevated wedge pressure,  EF severely depressed, global Hypokinesis, EF 20% --continue metoprolol, lisinopril, aldactone   TELEMETRY reviewed by me (LT) 05/20/2023 : None available for review  EKG reviewed by me: none available for review  Data reviewed by me (LT) 05/20/2023: ED note, admission H&P, podiatry note, nursing notes, last outpatient cardiology clinic note, last 24h vitals tele labs imaging I/O   Principal Problem:   Acute osteomyelitis (HCC) Active Problems:   Diabetes mellitus due to underlying condition with foot ulcer (CODE) (HCC)   CKD (chronic kidney disease) stage 2, GFR 60-89 ml/min   HFrEF (heart failure with reduced ejection fraction) (HCC)   Essential hypertension   Normocytic anemia    ASSESSMENT AND PLAN:  Gary Bentley is a 36yoM with a PMH of NICM (EF 30% 05/2021), DM2 with neuropathy, HTN, PAD (hx L toe amputation 2021), hx tobacco use who presented to [facility] ED 05/19/23 from his podiatrist office with right leg swelling and wound infection of his right toe.  Cardiology is consulted for medical optimization prior to anticipated toe amputation.  # Osteomyelitis of the right foot # Medical optimization prior to surgery Presents with an infection of his right great toe with cellulitic changes to his right leg that have been progressive for the past month despite outpatient antibiotics.  Imaging of his right foot  show changes in his right toe consistent with bone destruction and osteomyelitis for which amputation is recommended by podiatry.  His cardiomyopathy dates back to 2020 where his EF was 15-20%, although with optimal GDMT for 2 years his EF improved to 30% in July 2022.  He is able to perform greater than 4 METS of activity and denies active heart failure symptoms at this time and appears clinically euvolemic.  He has run out of his medications semirecently per his report, so we will aim to start him back on optimal GDMT prior to  discharge. -Agree with current therapy per primary team and podiatry -Continue metoprolol XL 12.5 mg pre-, peri-, and postoperatively -Start atorvastatin 40 mg daily -Risk factor modification with A1c, lipid panel.  LDL above goal at 101. -Obtain a baseline EKG  Addendum: EKG obtained which showed NSR with new TWI in leads III and aVF and TW flattening in V5 & V6, which are new compared to prior from 2020 in [facility]. Found more recent EKG for comparison from [facility] clinic visit 05/2021 which demonstrated NSR and similar TWI in the inferolateral leads. Overall reassuring that these abnormalities are chronic in nature. Reviewed with Dr. Darrold Junker - no change to plan.  -Echo complete -Patient is at low to moderate risk from a cardiovascular perspective to undergo toe amputation, without further modifiable risk factors or recommendations other than stated above prior to proceeding with surgery.  # Chronic HFrEF Notes a 20 to 30 pound weight gain over the past 6 to 7 months that the patient attributes to not watching his diet.  He denies active heart failure symptoms and appears euvolemic on exam. -Continue GDMT with metoprolol XL -Restart Entresto, spironolactone, furosemide as BP allows.  Hold Farxiga in the setting of cellulitis/osteomyelitis but aim to restart on outpatient basis. -Encourage compliance with medications and he knows to call our office for refills as needed -Encouraged low-salt, low-carb diet -Will arrange for close outpatient follow-up with Dr. Juliann Pares / [facility] heart failure at discharge.  Pending echo results, will also likely need outpatient referral to EP for discussions regarding secondary prevention ICD.  # DM2 A1c from 09/2022 was 8.3% which is still above goal, but showed significant improvement compared to 03/2022 at 11.6%. -Recheck A1c as above.  This patient's plan of care was discussed and created with Dr. Darrold Junker and he is in agreement.  Signed: Rebeca Allegra  , PA-C 05/20/2023, 9:44 AM [facility] Cardiology

## 2023-05-20 NOTE — Progress Notes (Signed)
Patient awake/alert x4, blood sugar upon arrival to pacu 73, per anesthesia Dr. Lorette Ang give 1/2 amp D50.  Patient tolerated gingerale and crackers without event.  Blood glucose re-checked 131

## 2023-05-20 NOTE — Inpatient Diabetes Management (Signed)
Inpatient Diabetes Program Recommendations  AACE/ADA: New Consensus Statement on Inpatient Glycemic Control   Target Ranges:  Prepandial:   less than 140 mg/dL      Peak postprandial:   less than 180 mg/dL (1-2 hours)      Critically ill patients:  140 - 180 mg/dL    Latest Reference Range & Units 05/19/23 19:16 05/19/23 22:22 05/20/23 07:47  Glucose-Capillary 70 - 99 mg/dL 73 83 027 (H)    Review of Glycemic Control  Diabetes history: DM2 Outpatient Diabetes medications: Farxiga 10 mg daily, 70/30 10 units BID (not taking per home med list) Current orders for Inpatient glycemic control: Semglee 5 units at bedtime, Novolog 0-15 units TID with meals  Inpatient Diabetes Program Recommendations:    Insulin: Agree with current insulin orders.  NOTE: Noted consult for diabetes coordinator per lower extremity wound order set. Chart reviewed. Agree with current insulin orders. Will discontinue consult; please reconsult if needed during hospitalization.  Thanks, Orlando Penner, RN, MSN, CDCES Diabetes Coordinator Inpatient Diabetes Program [phone] (Team Pager from 8am to 5pm)

## 2023-05-20 NOTE — Anesthesia Procedure Notes (Signed)
Procedure Name: LMA Insertion Date/Time: 05/20/2023 5:22 PM  Performed by: Joanette Gula, Solimar Maiden, CRNAPre-anesthesia Checklist: Patient identified, Emergency Drugs available, Suction available and Patient being monitored Patient Re-evaluated:Patient Re-evaluated prior to induction Oxygen Delivery Method: Circle system utilized Preoxygenation: Pre-oxygenation with 100% oxygen Induction Type: IV induction Ventilation: Mask ventilation without difficulty LMA: LMA inserted LMA Size: 5.0 Number of attempts: 1 Placement Confirmation: positive ETCO2 and breath sounds checked- equal and bilateral Tube secured with: Tape Dental Injury: Teeth and Oropharynx as per pre-operative assessment

## 2023-05-20 NOTE — ED Notes (Signed)
Patient given gown to change into.

## 2023-05-20 NOTE — Transfer of Care (Signed)
Immediate Anesthesia Transfer of Care Note  Patient: Gary Bentley  Procedure(s) Performed: AMPUTATION TOE, RIGHT GREAT TOE (Right: Toe)  Patient Location: PACU  Anesthesia Type:General  Level of Consciousness: drowsy  Airway & Oxygen Therapy: Patient Spontanous Breathing and Patient connected to face mask oxygen  Post-op Assessment: Report given to RN and Post -op Vital signs reviewed and stable  Post vital signs: Reviewed and stable  Last Vitals:  Vitals Value Taken Time  BP 143/88   Temp    Pulse 63   Resp 14   SpO2 100     Last Pain:  Vitals:   05/20/23 1635  TempSrc: Temporal  PainSc: 0-No pain         Complications: No notable events documented.

## 2023-05-20 NOTE — Anesthesia Preprocedure Evaluation (Signed)
Anesthesia Evaluation  Patient identified by MRN, date of birth, ID band Patient awake    Reviewed: Allergy & Precautions, H&P , NPO status , Patient's Chart, lab work & pertinent test results  History of Anesthesia Complications Negative for: history of anesthetic complications  Airway Mallampati: III  TM Distance: >3 FB Neck ROM: full    Dental  (+) Chipped   Pulmonary neg pulmonary ROS, neg shortness of breath, former smoker   Pulmonary exam normal        Cardiovascular Exercise Tolerance: Good hypertension, On Medications (-) angina +CHF  (-) DOE Normal cardiovascular exam     Neuro/Psych  PSYCHIATRIC DISORDERS      negative neurological ROS     GI/Hepatic negative GI ROS, Neg liver ROS,,,  Endo/Other  negative endocrine ROSdiabetes, Type 2, Insulin Dependent    Renal/GU      Musculoskeletal   Abdominal   Peds  Hematology negative hematology ROS (+)   Anesthesia Other Findings Cardiology's note from 6/27:   Gary Bentley is a 14yoM with a PMH of NICM (EF 30% 05/2021), DM2 with neuropathy, HTN, PAD (hx L toe amputation 2021), hx tobacco use who presented to [facility] ED 05/19/23 from his podiatrist office with right leg swelling and wound infection of his right toe.  Cardiology is consulted for medical optimization prior to anticipated toe amputation.   # Osteomyelitis of the right foot # Medical optimization prior to surgery Presents with an infection of his right great toe with cellulitic changes to his right leg that have been progressive for the past month despite outpatient antibiotics.  Imaging of his right foot show changes in his right toe consistent with bone destruction and osteomyelitis for which amputation is recommended by podiatry.  His cardiomyopathy dates back to 2020 where his EF was 15-20%, although with optimal GDMT for 2 years his EF improved to 30% in July 2022.  He is able to perform greater than 4  METS of activity and denies active heart failure symptoms at this time and appears clinically euvolemic.  He has run out of his medications semirecently per his report, so we will aim to start him back on optimal GDMT prior to discharge. -Agree with current therapy per primary team and podiatry -Continue metoprolol XL 12.5 mg pre-, peri-, and postoperatively -Start atorvastatin 40 mg daily -Risk factor modification with A1c, lipid panel.  LDL above goal at 101. -Obtain a baseline EKG             Addendum: EKG obtained which showed NSR with new TWI in leads III and aVF and TW flattening in V5 & V6, which are new compared to prior from 2020 in [facility]. Found more recent EKG for comparison from [facility] clinic visit 05/2021 which demonstrated NSR and similar TWI in the inferolateral leads. Overall reassuring that these abnormalities are chronic in nature. Reviewed with Dr. Darrold Junker - no change to plan.  -Echo complete -Patient is at low to moderate risk from a cardiovascular perspective to undergo toe amputation, without further modifiable risk factors or recommendations other than stated above prior to proceeding with surgery.   # Chronic HFrEF Notes a 20 to 30 pound weight gain over the past 6 to 7 months that the patient attributes to not watching his diet.  He denies active heart failure symptoms and appears euvolemic on exam. -Continue GDMT with metoprolol XL -Restart Entresto, spironolactone, furosemide as BP allows.  Hold Farxiga in the setting of cellulitis/osteomyelitis but aim to restart  on outpatient basis. -Encourage compliance with medications and he knows to call our office for refills as needed -Encouraged low-salt, low-carb diet -Will arrange for close outpatient follow-up with Dr. Juliann Pares / [facility] heart failure at discharge.  Pending echo results, will also likely need outpatient referral to EP for discussions regarding secondary prevention ICD.   # DM2 A1c from 09/2022 was 8.3% which  is still above goal, but showed significant improvement compared to 03/2022 at 11.6%. -Recheck A1c as above.  Past Medical History: No date: CHF (congestive heart failure) (HCC) No date: Diabetes mellitus without complication (HCC) No date: Hypertension  Past Surgical History: 01/05/2020: AMPUTATION TOE; Left     Comment:  Procedure: AMPUTATION TOE MPJ LEFT;  Surgeon: Gwyneth Revels, DPM;  Location: [facility] ORS;  Service: Podiatry;                Laterality: Left; No date: FOOT SURGERY 01/13/2019: RIGHT/LEFT HEART CATH AND CORONARY ANGIOGRAPHY; N/A     Comment:  Procedure: RIGHT/LEFT HEART CATH AND CORONARY               ANGIOGRAPHY;  Surgeon: Antonieta Iba, MD;  Location:               [facility] INVASIVE CV LAB;  Service: Cardiovascular;                Laterality: N/A;  BMI    Body Mass Index: 33.20 kg/m      Reproductive/Obstetrics negative OB ROS                             Anesthesia Physical Anesthesia Plan  ASA: 4  Anesthesia Plan: General   Post-op Pain Management:    Induction: Intravenous  PONV Risk Score and Plan: Ondansetron, Midazolam, Treatment may vary due to age or medical condition and Dexamethasone  Airway Management Planned: LMA  Additional Equipment:   Intra-op Plan:   Post-operative Plan:   Informed Consent: I have reviewed the patients History and Physical, chart, labs and discussed the procedure including the risks, benefits and alternatives for the proposed anesthesia with the patient or authorized representative who has indicated his/her understanding and acceptance.     Dental Advisory Given  Plan Discussed with: Anesthesiologist, CRNA and Surgeon  Anesthesia Plan Comments: (Patient consented for risks of anesthesia including but not limited to:  - adverse reactions to medications - risk of airway placement if required - damage to eyes, teeth, lips or other oral mucosa - nerve damage due to  positioning  - sore throat or hoarseness - Damage to heart, brain, nerves, lungs, other parts of body or loss of life  Patient voiced understanding.)       Anesthesia Quick Evaluation

## 2023-05-20 NOTE — Anesthesia Postprocedure Evaluation (Signed)
Anesthesia Post Note  Patient: Gary Bentley  Procedure(s) Performed: AMPUTATION TOE, RIGHT GREAT TOE (Right: Toe)  Patient location during evaluation: PACU Anesthesia Type: General Level of consciousness: awake and alert Pain management: pain level controlled Vital Signs Assessment: post-procedure vital signs reviewed and stable Respiratory status: spontaneous breathing, nonlabored ventilation, respiratory function stable and patient connected to nasal cannula oxygen Cardiovascular status: blood pressure returned to baseline and stable Postop Assessment: no apparent nausea or vomiting Anesthetic complications: no  No notable events documented.   Last Vitals:  Vitals:   05/20/23 1830 05/20/23 1859  BP: (!) 154/95 (!) 149/85  Pulse: 67 60  Resp: 17 16  Temp: (!) 36.3 C 37.1 C  SpO2: 96% 98%    Last Pain:  Vitals:   05/20/23 1830  TempSrc:   PainSc: 3                  Stephanie Coup

## 2023-05-20 NOTE — ED Notes (Signed)
Patient resting in bed with eyes closed. Resp even, unlabored on RA. No distress noted at this time. 

## 2023-05-20 NOTE — Progress Notes (Signed)
PROGRESS NOTE Gary Bentley  ZOX:[id number] DOB: 12/23/1970 DOA: 05/19/2023 PCP: Gracelyn Nurse, MD  Brief Narrative/Hospital Course: 52 y.o. male with medical history significant of type 2 diabetes, hypertension, HFrEF with last EF of 30% (2022), who presents to the ED due to wound infection on rt great toe for several months but over the last month or so, he has noticed that the redness and swelling has began to expand into his ankle and up his calf w/ malodorous purulent discharge from his great toe despite antibiotics.  In ED: bp 116/78 with heart rate of 107.  Heart rate improved to 85 with no intervention.  He was saturating at 96% on room air.  He was afebrile at 98.4.  Labs:WBC at 7.9, hemoglobin of 10.9, glucose of 134, creatinine 1.44 with GFR 59.  Lower extremity Doppler >> negative for DVT. Right foot x-ray>>evidence of osteomyelitis. Started on Zosyn with podiatry consultation and admitted for further management.     Subjective: Patient seen examined this morning in the ED Is alert awake resting comfortably Overnight afebrile BP 100-1 30s, not hypoxic Labs showed a stable CBC creatinine slightly better at 1.2  Assessment and Plan: Principal Problem:   Acute osteomyelitis (HCC) Active Problems:   Diabetes mellitus due to underlying condition with foot ulcer (CODE) (HCC)   HFrEF (heart failure with reduced ejection fraction) (HCC)   CKD (chronic kidney disease) stage 2, GFR 60-89 ml/min   Essential hypertension   Normocytic anemia   Acute osteomyelitis of right great toe Diabetic neuropathy Diabetic foot ulceration with infection ongoing since February: Appreciate podiatry follow-up-planning for amputation of right great toe pending ABI Rt LE. See by cardio>TTE ordered and not planning for any further workup preop.  Continue pain management oral and IV narcotics.  Continue ceftriaxone, Flagyl and vancomycin. He has completed a course of cefdinir (February 2024) and  Augmentin on 04/29/2023. Previous culture data demonstrated MSSA, Morganella and Proteus.  Pharmacy adjusting meds.  Diabetes mellitus due to underlying condition with foot ulcer:  Last A1c approximately 7 months ago was 8.3%.  PTA on 70/30 INSULIN 10 units twice daily and [place]. Holding OHA continue SSI Semglee 5 units bedtime follow-up A1c. Recent Labs  Lab 05/19/23 1916 05/19/23 2222 05/20/23 0747 05/20/23 1130  GLUCAP 73 83 113* 110*     HFrEF chronic with nonischemic cardiomyopathy with EF 30% 2022: He follows with the heart failure clinic with last follow-up in December 2023 at which time he was considered stable.  Remains euvolemic, GDMT with metoprolol per cardiology.  Also starting asa, statins  CKD 2: Baseline creatinine at 1.15 in 2023, on admission 1.4 slightly improving, monitor closely Recent Labs    05/19/23 1334 05/20/23 0412  BUN 19 16  CREATININE 1.44* 1.27*  CO2 23 23    Essential hypertension BP controlled-continue GDMT/meds per cardiology  Normocytic anemia Hb in 13.4 in May 2023 and is currently 10.9. monitor.  He will need age appropriate colonoscopy and other screening Recent Labs  Lab 05/19/23 1334 05/20/23 0412  HGB 10.9* 9.7*  HCT 35.0* 31.7*    Class i Obesity:Patient's Body mass index is 33.2 kg/m. : Will benefit with PCP follow-up, weight loss  healthy lifestyle and outpatient sleep evaluation.   DVT prophylaxis: SCDs Start: 05/19/23 1817 Code Status:   Code Status: Full Code Family Communication: plan of care discussed with patient at bedside. Patient status is: Inpatient because of osteomyelitis Level of care: Med-Surg   Dispo: The patient is from: home  Anticipated disposition: TBD Objective: Vitals last 24 hrs: Vitals:   05/20/23 0243 05/20/23 0245 05/20/23 0708 05/20/23 1010  BP: (!) 106/53 (!) 106/53 (!) 106/53 (!) 106/56  Pulse: (!) 59  (!) 59 60  Resp: 20  20 20   Temp: 97.9 F (36.6 C)  97.9 F (36.6 C) 97.7 F  (36.5 C)  TempSrc: Oral  Oral Oral  SpO2: 96%  96% 96%  Weight:      Height:       Weight change:   Physical Examination: General exam: alert awake, older than stated age HEENT:Oral mucosa moist, Ear/Nose WNL grossly Respiratory system: bilaterally clear BS, no use of accessory muscle Cardiovascular system: S1 & S2 +, No JVD. Gastrointestinal system: Abdomen soft,NT,ND, BS+ Nervous System:Alert, awake, moving extremities. Extremities: LE edema neg, rt fott in dressing Skin: No rashes,no icterus. MSK: Normal muscle bulk,tone, power   Medications reviewed:  Scheduled Meds:  aspirin  81 mg Oral Daily   atorvastatin  40 mg Oral Daily   insulin aspart  0-15 Units Subcutaneous TID WC   insulin glargine-yfgn  5 Units Subcutaneous QHS   metoprolol succinate  12.5 mg Oral Daily   sodium chloride flush  3 mL Intravenous Q12H   Continuous Infusions:  cefTRIAXone (ROCEPHIN)  IV Stopped (05/20/23 0039)   And   metronidazole Stopped (05/20/23 0737)   vancomycin Stopped (05/20/23 1149)    Diet Order             Diet NPO time specified  Diet effective midnight                  Intake/Output Summary (Last 24 hours) at 05/20/2023 1153 Last data filed at 05/20/2023 0737 Gross per 24 hour  Intake 500 ml  Output 475 ml  Net 25 ml   Net IO Since Admission: 25 mL [05/20/23 1153]  Wt Readings from Last 3 Encounters:  05/19/23 127 kg  11/04/22 133.8 kg  08/06/22 132.6 kg     Unresulted Labs (From admission, onward)     Start     Ordered   05/21/23 0500  Basic metabolic panel  Daily,   R      05/20/23 0805   05/21/23 0500  CBC  Daily,   R      05/20/23 0805   05/20/23 0500  HIV Antibody (routine testing w rflx)  (HIV Antibody (Routine testing w reflex) panel)  Tomorrow morning,   R        05/19/23 1816   05/19/23 1818  Hemoglobin A1c  Add-on,   AD        05/19/23 1819          Data Reviewed: I have personally reviewed following labs and imaging studies CBC: Recent  Labs  Lab 05/19/23 1334 05/20/23 0412  WBC 7.9 7.8  HGB 10.9* 9.7*  HCT 35.0* 31.7*  MCV 98.3 100.3*  PLT 392 375   Basic Metabolic Panel: Recent Labs  Lab 05/19/23 1334 05/20/23 0412  NA 137 137  K 3.6 3.5  CL 106 108  CO2 23 23  GLUCOSE 134* 101*  BUN 19 16  CREATININE 1.44* 1.27*  CALCIUM 8.9 8.5*   GFR: Estimated Creatinine Clearance: 102.7 mL/min (A) (by C-G formula based on SCr of 1.27 mg/dL (H)). Recent Labs  Lab 05/19/23 1916 05/19/23 2222 05/20/23 0747 05/20/23 1130  GLUCAP 73 83 113* 110*    No results found for this or any previous visit (from the past 240 hour(s)).  Antimicrobials: Anti-infectives (From admission, onward)    Start     Dose/Rate Route Frequency Ordered Stop   05/20/23 1000  vancomycin (VANCOREADY) IVPB 1500 mg/300 mL        1,500 mg 150 mL/hr over 120 Minutes Intravenous Every 12 hours 05/19/23 1905     05/19/23 1930  vancomycin (VANCOREADY) IVPB 1250 mg/250 mL       See Hyperspace for full Linked Orders Report.   1,250 mg 166.7 mL/hr over 90 Minutes Intravenous  Once 05/19/23 1743 05/19/23 2201   05/19/23 1830  cefTRIAXone (ROCEPHIN) 2 g in sodium chloride 0.9 % 100 mL IVPB       See Hyperspace for full Linked Orders Report.   2 g 200 mL/hr over 30 Minutes Intravenous Every 24 hours 05/19/23 1819 05/26/23 1829   05/19/23 1830  metroNIDAZOLE (FLAGYL) IVPB 500 mg       See Hyperspace for full Linked Orders Report.   500 mg 100 mL/hr over 60 Minutes Intravenous Every 12 hours 05/19/23 1819 05/26/23 1829   05/19/23 1800  vancomycin (VANCOREADY) IVPB 1250 mg/250 mL       See Hyperspace for full Linked Orders Report.   1,250 mg 166.7 mL/hr over 90 Minutes Intravenous  Once 05/19/23 1743 05/19/23 2001   05/19/23 1745  piperacillin-tazobactam (ZOSYN) IVPB 3.375 g  Status:  Discontinued        3.375 g 100 mL/hr over 30 Minutes Intravenous  Once 05/19/23 1736 05/19/23 1737      Culture/Microbiology    Component Value Date/Time    SDES  01/05/2020 1457    TOE left great toe bone segment Performed at [facility], [address] [place], [place] [address]    [facility]  01/05/2020 1457    NONE Performed at [facility], [address] [place], [place] [address]    CULT  01/05/2020 1457    FEW SERRATIA MARCESCENS FEW GROUP B STREP(S.AGALACTIAE)ISOLATED TESTING AGAINST S. AGALACTIAE NOT ROUTINELY PERFORMED DUE TO PREDICTABILITY OF AMP/PEN/VAN SUSCEPTIBILITY. NO ANAEROBES ISOLATED Performed at [facility], [address] [place], [place] [address]    REPTSTATUS 01/11/2020 FINAL 01/05/2020 1457     Radiology Studies: DG Foot Complete Right  Result Date: 05/19/2023 CLINICAL DATA:  Great toe wound. EXAM: RIGHT FOOT COMPLETE - 3+ VIEW COMPARISON:  Radiographs of September 04, 2017.  MRI of May 05, 2023. FINDINGS: There appears to be lytic destruction involving the distal portion of the first proximal phalanx consistent with osteomyelitis. This results in a comminuted pathologic fracture. Dorsal displacement of the first distal phalanx is noted. IMPRESSION: Comminuted pathologic fracture seen involving distal portion of first proximal phalanx secondary to osteomyelitis. Electronically Signed   By: Lupita Raider M.D.   On: 05/19/2023 16:31   US Venous Img Lower Unilateral Right  Result Date: 05/19/2023 CLINICAL DATA:  Right lower extremity pain and swelling EXAM: RIGHT LOWER EXTREMITY VENOUS DOPPLER ULTRASOUND TECHNIQUE: Gray-scale sonography with compression, as well as color and duplex ultrasound, were performed to evaluate the deep venous system(s) from the level of the common femoral vein through the popliteal and proximal calf veins. COMPARISON:  None Available. FINDINGS: VENOUS Normal compressibility of the common femoral, superficial femoral, and popliteal veins, as well as the visualized calf veins. Visualized portions of profunda femoral vein and great saphenous vein unremarkable. No  filling defects to suggest DVT on grayscale or color Doppler imaging. Doppler waveforms show normal direction of venous flow, normal respiratory plasticity and response to  augmentation. Limited views of the contralateral common femoral vein are unremarkable. OTHER Enlarged right inguinal lymph nodes the largest measuring 4.7 x 3.4 x 1.7 cm with fatty hilum. Limitations: none IMPRESSION: 1. No right lower extremity DVT. 2. Enlarged right inguinal lymph nodes. Electronically Signed   By: Larose Hires D.O.   On: 05/19/2023 14:56     LOS: 0 days   Lanae Boast, MD [facility]  05/20/2023, 11:53 AM

## 2023-05-20 NOTE — Hospital Course (Addendum)
52 y.o. male with medical history significant of type 2 diabetes, hypertension, HFrEF with last EF of 30% (2022), who presents to the ED due to wound infection on rt great toe for several months but over the last month or so, he has noticed that the redness and swelling has began to expand into his ankle and up his calf w/ malodorous purulent discharge from his great toe despite antibiotics.  In ED: bp 116/78 with heart rate of 107.  Heart rate improved to 85 with no intervention.  He was saturating at 96% on room air.  He was afebrile at 98.4.  Labs:WBC at 7.9, hemoglobin of 10.9, glucose of 134, creatinine 1.44 with GFR 59.  Lower extremity Doppler >> negative for DVT. Right foot x-ray>>evidence of osteomyelitis. Started on Zosyn with podiatry consultation and admitted for further management.  Patient underwent amputation of the toe postop doing well.  Per Ortho ability to therapeutic amputation may have superimposed infection that should be covered by oral antibiotics they will arrange for outpatient follow-up and okay for discharge

## 2023-05-20 NOTE — ED Notes (Signed)
Patient is resting comfortably. 

## 2023-05-20 NOTE — Plan of Care (Signed)

## 2023-05-20 NOTE — Op Note (Signed)
Operative note   Surgeon:Essense Bousquet [employer] forces logistics/support/administrative officer: None    Preop diagnosis: Osteomyelitis right great toe    Postop diagnosis: Same    Procedure: Amputation right great toe first MTPJ    EBL: Minimal    Anesthesia:local and general.  Local consisted of a total of 10 cc of 0.5% bupivacaine infiltrated around the great toe joint    Hemostasis: None    Specimen: Bone for culture and great toe for pathology    Complications: None    Operative indications:Gary Bentley is an 52 y.o. that presents today for surgical intervention.  The risks/benefits/alternatives/complications have been discussed and consent has been given.    Procedure:  Patient was brought into the OR and placed on the operating table in thesupine position. After anesthesia was obtained theright lower extremity was prepped and draped in usual sterile fashion.  Attention was directed to the great toe where a fishmouth incision was placed just proximal to the interphalangeal joint area distal to the MTPJ.  Full-thickness flaps were created.  The toe was disarticulated at the MTPJ and removed from the surgical field in toto.  A piece of fractured pathological fracture bone was removed from the surgical site and sent for culture.  The wound was flushed with copious amounts of irrigation.  All bleeders were Bovie cauterized.  Closure was then performed with a 3-0 nylon.    Patient tolerated the procedure and anesthesia well.  Was transported from the OR to the PACU with all vital signs stable and vascular status intact. To be discharged per routine protocol.  Will follow up in approximately 1 week in the outpatient clinic.

## 2023-05-21 ENCOUNTER — Encounter: Payer: Self-pay | Admitting: Podiatry

## 2023-05-21 ENCOUNTER — Inpatient Hospital Stay
Admit: 2023-05-21 | Discharge: 2023-05-21 | Disposition: A | Discharge status: Home / Self Care | Payer: BC Managed Care – PPO | Attending: Podiatry | Admitting: Podiatry

## 2023-05-21 DIAGNOSIS — M8610 Other acute osteomyelitis, unspecified site: Secondary | ICD-10-CM | POA: Diagnosis not present

## 2023-05-21 LAB — CBC
HCT: 34.6 % — ABNORMAL LOW (ref 39.0–52.0)
Hemoglobin: 10.8 g/dL — ABNORMAL LOW (ref 13.0–17.0)
MCH: 30.9 pg (ref 26.0–34.0)
MCHC: 31.2 g/dL (ref 30.0–36.0)
MCV: 98.9 fL (ref 80.0–100.0)
Platelets: 338 10*3/uL (ref 150–400)
RBC: 3.5 MIL/uL — ABNORMAL LOW (ref 4.22–5.81)
RDW: 12.6 % (ref 11.5–15.5)
WBC: 5.3 10*3/uL (ref 4.0–10.5)
nRBC: 0 % (ref 0.0–0.2)

## 2023-05-21 LAB — ECHOCARDIOGRAM COMPLETE
AR max vel: 2.05 cm2
AV Area VTI: 2.18 cm2
AV Area mean vel: 1.87 cm2
AV Mean grad: 2 mmHg
AV Peak grad: 4.3 mmHg
Ao pk vel: 1.04 m/s
Area-P 1/2: 3.93 cm2
Calc EF: 42.5 %
Height: 77.5 in
MV VTI: 1.72 cm2
S' Lateral: 4.6 cm
Single Plane A2C EF: 53.5 %
Single Plane A4C EF: 35.5 %
Weight: 4994.74 oz

## 2023-05-21 LAB — BASIC METABOLIC PANEL
Anion gap: 9 (ref 5–15)
BUN: 17 mg/dL (ref 6–20)
CO2: 23 mmol/L (ref 22–32)
Calcium: 8.1 mg/dL — ABNORMAL LOW (ref 8.9–10.3)
Chloride: 104 mmol/L (ref 98–111)
Creatinine, Ser: 1.18 mg/dL (ref 0.61–1.24)
GFR, Estimated: >60 mL/min (ref >60)
Glucose, Bld: 291 mg/dL — ABNORMAL HIGH (ref 70–99)
Potassium: 3.9 mmol/L (ref 3.5–5.1)
Sodium: 136 mmol/L (ref 135–145)

## 2023-05-21 LAB — GLUCOSE, CAPILLARY
Glucose-Capillary: 269 mg/dL — ABNORMAL HIGH (ref 70–99)
Glucose-Capillary: 206 mg/dL — ABNORMAL HIGH (ref 70–99)
Glucose-Capillary: 210 mg/dL — ABNORMAL HIGH (ref 70–99)
Glucose-Capillary: 218 mg/dL — ABNORMAL HIGH (ref 70–99)

## 2023-05-21 LAB — HEMOGLOBIN A1C
Hgb A1c MFr Bld: 7.9 % — ABNORMAL HIGH (ref 4.8–5.6)
Mean Plasma Glucose: 180 mg/dL

## 2023-05-21 LAB — AEROBIC/ANAEROBIC CULTURE W GRAM STAIN (SURGICAL/DEEP WOUND)

## 2023-05-21 MED ORDER — SPIRONOLACTONE 12.5 MG HALF TABLET
12.5000 mg | ORAL_TABLET | Freq: Every day | ORAL | Status: DC
  Administered 2023-05-22: 12.5 mg via ORAL
  Filled 2023-05-21: qty 1

## 2023-05-21 MED ORDER — ENSURE MAX PROTEIN PO LIQD
11.0000 [oz_av] | Freq: Every day | ORAL | Status: DC
  Administered 2023-05-21: 11 [oz_av] via ORAL
  Filled 2023-05-21: qty 330

## 2023-05-21 MED ORDER — INSULIN ASPART PROT & ASPART (70-30 MIX) 100 UNIT/ML ~~LOC~~ SUSP
10.0000 [IU] | Freq: Two times a day (BID) | SUBCUTANEOUS | Status: DC
  Administered 2023-05-21 – 2023-05-22 (×2): 10 [IU] via SUBCUTANEOUS
  Filled 2023-05-21: qty 10

## 2023-05-21 MED ORDER — METOPROLOL SUCCINATE ER 25 MG PO TB24
25.0000 mg | ORAL_TABLET | Freq: Every day | ORAL | Status: DC
  Administered 2023-05-22: 25 mg via ORAL
  Filled 2023-05-21: qty 1

## 2023-05-21 MED ORDER — ADULT MULTIVITAMIN W/MINERALS CH
1.0000 | ORAL_TABLET | Freq: Every day | ORAL | Status: DC
  Administered 2023-05-21 – 2023-05-22 (×2): 1 via ORAL
  Filled 2023-05-21 (×2): qty 1

## 2023-05-21 MED ORDER — INSULIN ASPART 100 UNIT/ML IJ SOLN
0.0000 [IU] | Freq: Three times a day (TID) | INTRAMUSCULAR | Status: DC
  Administered 2023-05-21 (×2): 3 [IU] via SUBCUTANEOUS
  Administered 2023-05-22 (×2): 5 [IU] via SUBCUTANEOUS
  Filled 2023-05-21 (×4): qty 1

## 2023-05-21 MED ORDER — VITAMIN C 500 MG PO TABS
500.0000 mg | ORAL_TABLET | Freq: Two times a day (BID) | ORAL | Status: DC
  Administered 2023-05-21 – 2023-05-22 (×2): 500 mg via ORAL
  Filled 2023-05-21 (×2): qty 1

## 2023-05-21 MED ORDER — FUROSEMIDE 40 MG PO TABS
40.0000 mg | ORAL_TABLET | Freq: Every day | ORAL | Status: DC
  Administered 2023-05-21 – 2023-05-22 (×2): 40 mg via ORAL
  Filled 2023-05-21 (×2): qty 1

## 2023-05-21 MED ORDER — SACUBITRIL-VALSARTAN 24-26 MG PO TABS
1.0000 | ORAL_TABLET | Freq: Two times a day (BID) | ORAL | Status: DC
  Administered 2023-05-21 – 2023-05-22 (×3): 1 via ORAL
  Filled 2023-05-21 (×3): qty 1

## 2023-05-21 MED ORDER — ZINC SULFATE 220 (50 ZN) MG PO CAPS
220.0000 mg | ORAL_CAPSULE | Freq: Every day | ORAL | Status: DC
  Administered 2023-05-21 – 2023-05-22 (×2): 220 mg via ORAL
  Filled 2023-05-21 (×2): qty 1

## 2023-05-21 NOTE — Progress Notes (Signed)
PROGRESS NOTE Gary Bentley  ZOX:[id number] DOB: 01-08-1971 DOA: 05/19/2023 PCP: Gracelyn Nurse, MD  Brief Narrative/Hospital Course: 52 y.o. male with medical history significant of type 2 diabetes, hypertension, HFrEF with last EF of 30% (2022), who presents to the ED due to wound infection on rt great toe for several months but over the last month or so, he has noticed that the redness and swelling has began to expand into his ankle and up his calf w/ malodorous purulent discharge from his great toe despite antibiotics.  In ED: bp 116/78 with heart rate of 107.  Heart rate improved to 85 with no intervention.  He was saturating at 96% on room air.  He was afebrile at 98.4.  Labs:WBC at 7.9, hemoglobin of 10.9, glucose of 134, creatinine 1.44 with GFR 59.  Lower extremity Doppler >> negative for DVT. Right foot x-ray>>evidence of osteomyelitis. Started on Zosyn with podiatry consultation and admitted for further management.     Subjective: Patient seen and examined Resting comfortably, right foot dressing in place Overnight afebrile BP stable Labs without leukocytosis  creat better at 1.1 Able to mobilize with rt foot boot.  Assessment and Plan: Principal Problem:   Acute osteomyelitis (HCC) Active Problems:   Diabetes mellitus due to underlying condition with foot ulcer (CODE) (HCC)   HFrEF (heart failure with reduced ejection fraction) (HCC)   CKD (chronic kidney disease) stage 2, GFR 60-89 ml/min   Essential hypertension   Normocytic anemia   Acute osteomyelitis of right great toe Diabetic neuropathy Diabetic foot ulceration with infection ongoing since February: Appreciate podiatry follow-up, ABI unremarkable underwent amputation of right great toe first MTPJ 6/27, IntraOp culture rare WBC pending Continue pain management oral and IV narcotics.  Continue current antibiotics with IV Zosyn based upon previous culture report of MSSA.He has completed a course of cefdinir  (February 2024) and Augmentin on 04/29/2023. Previous culture data demonstrated MSSA, Morganella and Proteus.  Pharmacy following for antibiotics.  Continue pain management  Diabetes mellitus due to underlying condition with foot ulcer:  Last A1c approximately 7 months ago was 8.3%.  PTA on 70/30 INSULIN 10 units twice daily and [place]. Holding OHA continue SSI, adjust semeglee further A1c 7.9 Recent Labs  Lab 05/19/23 1334 05/19/23 1916 05/20/23 1627 05/20/23 1800 05/20/23 1832 05/20/23 2119 05/21/23 0745  GLUCAP  --    < > 79 73 131* 114* 269*  HGBA1C 7.9*  --   --   --   --   --   --    < > = values in this interval not displayed.    HFrEF chronic with nonischemic cardiomyopathy with EF 30% 2022: He follows with the heart failure clinic with last follow-up in December 2023 at which time he was considered stable.  Remains euvolemic, GDMT with metoprolol and Entresto as per cardiology.  2D echo pending.  Continue aspirin and statin.  CKD 2: Baseline creatinine at 1.15 in 2023, on admission 1.4 downtrending.   Recent Labs    05/19/23 1334 05/20/23 0412 05/21/23 0502  BUN 19 16 17   CREATININE 1.44* 1.27* 1.18  CO2 23 23 23      Essential hypertension BP controlled-continue GDMT/meds per cardiology  Normocytic anemia Hb in 13.4 in May 2023 and is currently 10 gm range, monitor Recent Labs  Lab 05/19/23 1334 05/20/23 0412 05/21/23 0502  HGB 10.9* 9.7* 10.8*  HCT 35.0* 31.7* 34.6*   Class I Obesity:Patient's Body mass index is 36.54 kg/m. : Will benefit with PCP  follow-up, weight loss  healthy lifestyle and outpatient sleep evaluation.   DVT prophylaxis: SCDs Start: 05/19/23 1817 Code Status:   Code Status: Full Code Family Communication: plan of care discussed with patient at bedside. Patient status is: Inpatient because of osteomyelitis Level of care: Med-Surg   Dispo: The patient is from: home            Anticipated disposition: TBD pending podiatry  clearance Objective: Vitals last 24 hrs: Vitals:   05/20/23 1859 05/20/23 1940 05/21/23 0411 05/21/23 0743  BP: (!) 149/85 (!) 148/93 135/86 (!) 144/92  Pulse: 60 62 65 73  Resp: 16 20 18 16   Temp: 98.7 F (37.1 C) 97.9 F (36.6 C) 97.9 F (36.6 C) 97.9 F (36.6 C)  TempSrc:      SpO2: 98% 97% 99% 99%  Weight:   (!) 141.6 kg   Height:       Weight change: 0 kg  Physical Examination: General exam: AA,, oriented x3, appears weak and older than stated HEENT:Oral mucosa moist, Ear/Nose WNL grossly, dentition normal. Respiratory system: bilaterally clear BS, no use of accessory muscle Cardiovascular system: S1 & S2 +, regular rate. Gastrointestinal system: Abdomen soft, NT,ND,BS+ Nervous System:Alert, awake, moving extremities and grossly nonfocal Extremities: LE ankle edema neg rt foot w/ dressing+ Skin: No rashes,no icterus. MSK: normal/small/weak muscle bulk,tone, power   Medications reviewed:  Scheduled Meds:  aspirin  81 mg Oral Daily   atorvastatin  40 mg Oral Daily   insulin aspart  0-15 Units Subcutaneous TID WC   insulin glargine-yfgn  5 Units Subcutaneous QHS   metoprolol succinate  12.5 mg Oral Daily   potassium chloride  40 mEq Oral Once   sodium chloride flush  3 mL Intravenous Q12H   Continuous Infusions:  piperacillin-tazobactam (ZOSYN)  IV 3.375 g (05/21/23 0548)    Diet Order             Diet heart healthy/carb modified Room service appropriate? Yes; Fluid consistency: Thin  Diet effective now                  Intake/Output Summary (Last 24 hours) at 05/21/2023 0843 Last data filed at 05/21/2023 0717 Gross per 24 hour  Intake 883 ml  Output 605 ml  Net 278 ml    Net IO Since Admission: 303 mL [05/21/23 0843]  Wt Readings from Last 3 Encounters:  05/21/23 (!) 141.6 kg  11/04/22 133.8 kg  08/06/22 132.6 kg     Unresulted Labs (From admission, onward)     Start     Ordered   05/21/23 0500  Basic metabolic panel  Daily,   R      05/20/23  0805   05/21/23 0500  CBC  Daily,   R      05/20/23 0805          Data Reviewed: I have personally reviewed following labs and imaging studies CBC: Recent Labs  Lab 05/19/23 1334 05/20/23 0412 05/21/23 0502  WBC 7.9 7.8 5.3  HGB 10.9* 9.7* 10.8*  HCT 35.0* 31.7* 34.6*  MCV 98.3 100.3* 98.9  PLT 392 375 338   Basic Metabolic Panel: Recent Labs  Lab 05/19/23 1334 05/20/23 0412 05/21/23 0502  NA 137 137 136  K 3.6 3.5 3.9  CL 106 108 104  CO2 23 23 23   GLUCOSE 134* 101* 291*  BUN 19 16 17   CREATININE 1.44* 1.27* 1.18  CALCIUM 8.9 8.5* 8.1*   FAO:ZHYQMVHQI Creatinine Clearance: 117.4 mL/min (by  C-G formula based on SCr of 1.18 mg/dL). Recent Labs  Lab 05/20/23 1627 05/20/23 1800 05/20/23 1832 05/20/23 2119 05/21/23 0745  GLUCAP 79 73 131* 114* 269*    Recent Results (from the past 240 hour(s))  Aerobic/Anaerobic Culture w Gram Stain (surgical/deep wound)     Status: None (Preliminary result)   Collection Time: 05/20/23  5:38 PM   Specimen: Path Tissue  Result Value Ref Range Status   Specimen Description   Final    TISSUE Performed at [facility], [address], [place], [place] [address]    Special Requests   Final    NONE Performed at [facility], [address] [place], [place] [address]    Gram Stain   Final    RARE WBC PRESENT, PREDOMINANTLY PMN NO ORGANISMS SEEN Performed at [facility], [address], [place], [place] [address]    Culture PENDING  Incomplete   Report Status PENDING  Incomplete    Antimicrobials: Anti-infectives (From admission, onward)    Start     Dose/Rate Route Frequency Ordered Stop   05/20/23 2000  piperacillin-tazobactam (ZOSYN) IVPB 3.375 g        3.375 g 12.5 mL/hr over 240 Minutes Intravenous Every 8 hours 05/20/23 1357     05/20/23 1000  vancomycin (VANCOREADY) IVPB 1500 mg/300 mL  Status:  Discontinued        1,500 mg 150 mL/hr over 120 Minutes Intravenous Every 12 hours  05/19/23 1905 05/20/23 1357   05/19/23 1930  vancomycin (VANCOREADY) IVPB 1250 mg/250 mL       See Hyperspace for full Linked Orders Report.   1,250 mg 166.7 mL/hr over 90 Minutes Intravenous  Once 05/19/23 1743 05/19/23 2201   05/19/23 1830  cefTRIAXone (ROCEPHIN) 2 g in sodium chloride 0.9 % 100 mL IVPB  Status:  Discontinued       See Hyperspace for full Linked Orders Report.   2 g 200 mL/hr over 30 Minutes Intravenous Every 24 hours 05/19/23 1819 05/20/23 1357   05/19/23 1830  metroNIDAZOLE (FLAGYL) IVPB 500 mg  Status:  Discontinued       See Hyperspace for full Linked Orders Report.   500 mg 100 mL/hr over 60 Minutes Intravenous Every 12 hours 05/19/23 1819 05/20/23 1357   05/19/23 1800  vancomycin (VANCOREADY) IVPB 1250 mg/250 mL       See Hyperspace for full Linked Orders Report.   1,250 mg 166.7 mL/hr over 90 Minutes Intravenous  Once 05/19/23 1743 05/19/23 2001   05/19/23 1745  piperacillin-tazobactam (ZOSYN) IVPB 3.375 g  Status:  Discontinued        3.375 g 100 mL/hr over 30 Minutes Intravenous  Once 05/19/23 1736 05/19/23 1737     Culture/Microbiology    Component Value Date/Time   SDES  05/20/2023 1738    TISSUE Performed at [facility], [address] [place], [place] [address]    [facility]  05/20/2023 1738    NONE Performed at [facility], [address], [place], [place] [address]    CULT PENDING 05/20/2023 1738   REPTSTATUS PENDING 05/20/2023 1738   Radiology Studies: US ARTERIAL ABI (SCREENING LOWER EXTREMITY)  Result Date: 05/20/2023 CLINICAL DATA:  52 year old male with history of diabetic ulceration. EXAM: NONINVASIVE PHYSIOLOGIC VASCULAR STUDY OF BILATERAL LOWER EXTREMITIES TECHNIQUE: Evaluation of both lower extremities were performed at rest, including calculation of ankle-brachial indices with single level Doppler, pressure and pulse volume recording. COMPARISON:  None Available. FINDINGS: Right  ABI:  1.17 Left ABI:  1.13 Right  Lower Extremity:  Normal arterial waveforms at the ankle. Left Lower Extremity:  Normal arterial waveforms at the ankle. IMPRESSION: Normal ankle-brachial indices. No evidence of significant bilateral lower extremity peripheral artery disease. Marliss Coots, MD [facility] Radiology Electronically Signed   By: Marliss Coots M.D.   On: 05/20/2023 12:40   DG Foot Complete Right  Result Date: 05/19/2023 CLINICAL DATA:  Great toe wound. EXAM: RIGHT FOOT COMPLETE - 3+ VIEW COMPARISON:  Radiographs of September 04, 2017.  MRI of May 05, 2023. FINDINGS: There appears to be lytic destruction involving the distal portion of the first proximal phalanx consistent with osteomyelitis. This results in a comminuted pathologic fracture. Dorsal displacement of the first distal phalanx is noted. IMPRESSION: Comminuted pathologic fracture seen involving distal portion of first proximal phalanx secondary to osteomyelitis. Electronically Signed   By: Lupita Raider M.D.   On: 05/19/2023 16:31   US Venous Img Lower Unilateral Right  Result Date: 05/19/2023 CLINICAL DATA:  Right lower extremity pain and swelling EXAM: RIGHT LOWER EXTREMITY VENOUS DOPPLER ULTRASOUND TECHNIQUE: Gray-scale sonography with compression, as well as color and duplex ultrasound, were performed to evaluate the deep venous system(s) from the level of the common femoral vein through the popliteal and proximal calf veins. COMPARISON:  None Available. FINDINGS: VENOUS Normal compressibility of the common femoral, superficial femoral, and popliteal veins, as well as the visualized calf veins. Visualized portions of profunda femoral vein and great saphenous vein unremarkable. No filling defects to suggest DVT on grayscale or color Doppler imaging. Doppler waveforms show normal direction of venous flow, normal respiratory plasticity and response to augmentation. Limited views of the contralateral common femoral vein  are unremarkable. OTHER Enlarged right inguinal lymph nodes the largest measuring 4.7 x 3.4 x 1.7 cm with fatty hilum. Limitations: none IMPRESSION: 1. No right lower extremity DVT. 2. Enlarged right inguinal lymph nodes. Electronically Signed   By: Larose Hires D.O.   On: 05/19/2023 14:56     LOS: 1 day  Lanae Boast, MD [facility] 05/21/2023, 8:43 AM

## 2023-05-21 NOTE — Progress Notes (Signed)
Patient is alert  and oriented x4. Ambulated ad lib despite new amputation of R-great toe. Dressing has a scant area of serosanguineous fluid. He denied pain and additional needs. Blood glucose last night was 114.

## 2023-05-21 NOTE — Plan of Care (Signed)
  Problem: Coping: Goal: Ability to adjust to condition or change in health will improve Outcome: Progressing   Problem: Nutritional: Goal: Maintenance of adequate nutrition will improve Outcome: Progressing   Problem: Tissue Perfusion: Goal: Adequacy of tissue perfusion will improve Outcome: Progressing   Problem: Activity: Goal: Risk for activity intolerance will decrease Outcome: Progressing   Problem: Nutrition: Goal: Adequate nutrition will be maintained Outcome: Progressing   Problem: Pain Managment: Goal: General experience of comfort will improve Outcome: Progressing

## 2023-05-21 NOTE — Progress Notes (Signed)
[facility] CLINIC CARDIOLOGY CONSULT NOTE       Patient ID: Gary Bentley MRN: [id number] DOB/AGE: Mar 16, 1971 52 y.o.  Admit date: 05/19/2023 Referring Physician Dr. Verdene Lennert  Primary Physician   Primary Cardiologist Dr. Juliann Pares Reason for Consultation pre-operative medical optimization   HPI: Gary Bentley is a 52yoM with a PMH of NICM (EF 30% 05/2021), DM2 with neuropathy, HTN, PAD (hx L toe amputation 2021), hx tobacco use who presented to [facility] ED 05/19/23 from his podiatrist office with right leg swelling and wound infection of his right toe.  Cardiology is consulted for medical optimization prior to right great toe amputation, which was performed the evening of 6/27 without untoward event.  Interval history: -Seen and examined this afternoon, he underwent right great toe amputation with podiatry yesterday evening which went well.  Fortunately the patient's ABIs were normal so vascular surgery did not need to be involved. -He is sitting upright on the side of the bed with continued swelling in his right leg which is the patient's main complaint. -He denies chest pain, shortness of breath, orthopnea, heart racing or palpitations.  Review of systems complete and found to be negative unless listed above     Past Medical History:  Diagnosis Date   CHF (congestive heart failure) (HCC)    Diabetes mellitus without complication (HCC)    Hypertension     Past Surgical History:  Procedure Laterality Date   AMPUTATION TOE Left 01/05/2020   Procedure: AMPUTATION TOE MPJ LEFT;  Surgeon: Gwyneth Revels, DPM;  Location: [facility] ORS;  Service: Podiatry;  Laterality: Left;   AMPUTATION TOE Right 05/20/2023   Procedure: AMPUTATION TOE, RIGHT GREAT TOE;  Surgeon: Gwyneth Revels, DPM;  Location: [facility] ORS;  Service: Podiatry;  Laterality: Right;   FOOT SURGERY     RIGHT/LEFT HEART CATH AND CORONARY ANGIOGRAPHY N/A 01/13/2019   Procedure: RIGHT/LEFT HEART CATH AND CORONARY ANGIOGRAPHY;  Surgeon:  Antonieta Iba, MD;  Location: [facility] INVASIVE CV LAB;  Service: Cardiovascular;  Laterality: N/A;    Medications Prior to Admission  Medication Sig Dispense Refill Last Dose   dapagliflozin propanediol (FARXIGA) 10 MG TABS tablet Take 1 tablet (10 mg total) by mouth daily before breakfast. 90 tablet 3 Past Month   isosorbide mononitrate (IMDUR) 30 MG 24 hr tablet Take 1 tablet (30 mg total) by mouth daily. 90 tablet 3 Past Month   meloxicam (MOBIC) 15 MG tablet Take 15 mg by mouth daily.   Past Week   metoprolol succinate (TOPROL-XL) 50 MG 24 hr tablet Take 1 tablet (50 mg total) by mouth daily. Take with or immediately following a meal. 90 tablet 3 Past Month   Cysteamine Bitartrate (PROCYSBI) 300 MG PACK Use 1 each 3 (three) times daily Use as instructed. ONE TOUCH DELICA LANCETS E11.9 (Patient not taking: Reported on 08/06/2022)      furosemide (LASIX) 40 MG tablet Take 1 tablet (40 mg total) by mouth 2 (two) times daily. (Patient not taking: Reported on 05/19/2023) 180 tablet 3 Not Taking   insulin aspart protamine - aspart (NOVOLOG MIX 70/30 FLEXPEN) (70-30) 100 UNIT/ML FlexPen Inject 0.1 mLs (10 Units total) into the skin 2 (two) times daily. (Patient not taking: Reported on 05/19/2023) 15 mL 11 Not Taking   potassium chloride SA (KLOR-CON M) 20 MEQ tablet Take by mouth as needed. With lasix (Patient not taking: Reported on 08/06/2022)      sacubitril-valsartan (ENTRESTO) 49-51 MG Take 1 tablet by mouth 2 (two) times daily. (Patient not taking:  Reported on 05/19/2023) 180 tablet 3 Not Taking   spironolactone (ALDACTONE) 25 MG tablet Take 0.5 tablets (12.5 mg total) by mouth daily. (Patient not taking: Reported on 05/19/2023) 45 tablet 3 Not Taking    Social History   Socioeconomic History   Marital status: Single    Spouse name: Not on file   Number of children: Not on file   Years of education: Not on file   Highest education level: Not on file  Occupational History   Not on file   Tobacco Use   Smoking status: Former    Packs/day: 1    Types: Cigarettes    Quit date: 04/17/2017    Years since quitting: 6.0   Smokeless tobacco: Never  Vaping Use   Vaping Use: Never used  Substance and Sexual Activity   Alcohol use: No   Drug use: No   Sexual activity: Not Currently  Other Topics Concern   Not on file  Social History Narrative   No place to live at this time, independent otherwise   Social Determinants of Health   Financial Resource Strain: Low Risk  (09/20/2019)   Overall Financial Resource Strain (CARDIA)    Difficulty of Paying Living Expenses: Not hard at all  Food Insecurity: No Food Insecurity (05/20/2023)   Hunger Vital Sign    Worried About Running Out of Food in the Last Year: Never true    Ran Out of Food in the Last Year: Never true  Transportation Needs: No Transportation Needs (05/20/2023)   PRAPARE - Administrator, Civil Service (Medical): No    Lack of Transportation (Non-Medical): No  Physical Activity: Sufficiently Active (08/25/2019)   Exercise Vital Sign    Days of Exercise per Week: 5 days    Minutes of Exercise per Session: 150+ min  Stress: No Stress Concern Present (08/25/2019)   [employer] of Occupational Health - Occupational Stress Questionnaire    Feeling of Stress : Not at all  Social Connections: Unknown (08/25/2019)   Social Connection and Isolation Panel [NHANES]    Frequency of Communication with Friends and Family: Never    Frequency of Social Gatherings with Friends and Family: Never    Attends Religious Services: Never    Database administrator or Organizations: No    Attends Banker Meetings: Never    Marital Status: Not on file  Intimate Partner Violence: Not At Risk (05/20/2023)   Humiliation, Afraid, Rape, and Kick questionnaire    Fear of Current or Ex-Partner: No    Emotionally Abused: No    Physically Abused: No    Sexually Abused: No    Family History  Family history  unknown: Yes      Intake/Output Summary (Last 24 hours) at 05/21/2023 1324 Last data filed at 05/21/2023 1320 Gross per 24 hour  Intake 1123 ml  Output 805 ml  Net 318 ml     Vitals:   05/20/23 1859 05/20/23 1940 05/21/23 0411 05/21/23 0743  BP: (!) 149/85 (!) 148/93 135/86 (!) 144/92  Pulse: 60 62 65 73  Resp: 16 20 18 16   Temp: 98.7 F (37.1 C) 97.9 F (36.6 C) 97.9 F (36.6 C) 97.9 F (36.6 C)  TempSrc:      SpO2: 98% 97% 99% 99%  Weight:   (!) 141.6 kg   Height:        PHYSICAL EXAM General: Pleasant middle-aged black male, well nourished, in no acute distress.  Sitting upright in  chair to room on 2C. HEENT:  Normocephalic and atraumatic. Neck:  No JVD.  Lungs: Normal respiratory effort on room air. Clear bilaterally to auscultation. No wheezes, crackles, rhonchi.  Heart: HRRR . Normal S1 and S2 without gallops or murmurs.  Abdomen: Non-distended appearing with excess adiposity.  Msk: Normal strength and tone for age. Extremities: Right lower extremity with postoperative dressing with Kerlix and Ace bandage with associated swelling and and erythema up to his mid calf, left lower extremity with trace ankle edema. Neuro: Alert and oriented X 3. Psych:  Answers questions appropriately.   Labs: Basic Metabolic Panel: Recent Labs    05/20/23 0412 05/21/23 0502  NA 137 136  K 3.5 3.9  CL 108 104  CO2 23 23  GLUCOSE 101* 291*  BUN 16 17  CREATININE 1.27* 1.18  CALCIUM 8.5* 8.1*    Liver Function Tests: No results for input(s): "AST", "ALT", "ALKPHOS", "BILITOT", "PROT", "ALBUMIN" in the last 72 hours. No results for input(s): "LIPASE", "AMYLASE" in the last 72 hours. CBC: Recent Labs    05/20/23 0412 05/21/23 0502  WBC 7.8 5.3  HGB 9.7* 10.8*  HCT 31.7* 34.6*  MCV 100.3* 98.9  PLT 375 338    Cardiac Enzymes: No results for input(s): "CKTOTAL", "CKMB", "CKMBINDEX", "TROPONINIHS" in the last 72 hours. BNP: No results for input(s): "BNP" in the last  72 hours. D-Dimer: No results for input(s): "DDIMER" in the last 72 hours. Hemoglobin A1C: Recent Labs    05/19/23 1334  HGBA1C 7.9*   Fasting Lipid Panel: Recent Labs    05/20/23 0412  CHOL 153  HDL 36*  LDLCALC 101*  TRIG 81  CHOLHDL 4.3    Thyroid Function Tests: No results for input(s): "TSH", "T4TOTAL", "T3FREE", "THYROIDAB" in the last 72 hours.  Invalid input(s): "FREET3" Anemia Panel: No results for input(s): "VITAMINB12", "FOLATE", "FERRITIN", "TIBC", "IRON", "RETICCTPCT" in the last 72 hours.   Radiology: ECHOCARDIOGRAM COMPLETE  Result Date: 05/21/2023    ECHOCARDIOGRAM REPORT   Patient Name:   JAKEN NIEDRINGHAUS Date of Exam: 05/21/2023 Medical Rec #:  [id number]      Height:       77.5 in Accession #:    [id number]     Weight:       312.2 lb Date of Birth:  [birth year]     BSA:          2.716 m Patient Age:    51 years       BP:           135/86 mmHg Patient Gender: M              HR:           65 bpm. Exam Location:  [facility] Procedure: 2D Echo, Cardiac Doppler and Color Doppler Indications:     Nonischemic cardiomyopathy I42.8  History:         Patient has prior history of Echocardiogram examinations, most                  recent 01/10/2019. CHF; Risk Factors:Diabetes and Hypertension.  Sonographer:     Cristela Blue Referring Phys:  [id number] JUSTIN FOWLER Diagnosing Phys: Marcina Millard MD IMPRESSIONS  1. Left ventricular ejection fraction, by estimation, is 45 to 50%. The left ventricle has mildly decreased function. The left ventricle has no regional wall motion abnormalities. Left ventricular diastolic parameters were normal.  2. Right ventricular systolic function is normal. The right ventricular size is normal.  3. The  mitral valve is normal in structure. Trivial mitral valve regurgitation. No evidence of mitral stenosis.  4. The aortic valve is normal in structure. Aortic valve regurgitation is not visualized. No aortic stenosis is present.  5. The inferior vena cava is normal  in size with greater than 50% respiratory variability, suggesting right atrial pressure of 3 mmHg. FINDINGS  Left Ventricle: Left ventricular ejection fraction, by estimation, is 45 to 50%. The left ventricle has mildly decreased function. The left ventricle has no regional wall motion abnormalities. The left ventricular internal cavity size was normal in size. There is no left ventricular hypertrophy. Left ventricular diastolic parameters were normal. Right Ventricle: The right ventricular size is normal. No increase in right ventricular wall thickness. Right ventricular systolic function is normal. Left Atrium: Left atrial size was normal in size. Right Atrium: Right atrial size was normal in size. Pericardium: There is no evidence of pericardial effusion. Mitral Valve: The mitral valve is normal in structure. Trivial mitral valve regurgitation. No evidence of mitral valve stenosis. MV peak gradient, 2.8 mmHg. The mean mitral valve gradient is 1.0 mmHg. Tricuspid Valve: The tricuspid valve is normal in structure. Tricuspid valve regurgitation is trivial. No evidence of tricuspid stenosis. Aortic Valve: The aortic valve is normal in structure. Aortic valve regurgitation is not visualized. No aortic stenosis is present. Aortic valve mean gradient measures 2.0 mmHg. Aortic valve peak gradient measures 4.3 mmHg. Aortic valve area, by VTI measures 2.18 cm. Pulmonic Valve: The pulmonic valve was normal in structure. Pulmonic valve regurgitation is not visualized. No evidence of pulmonic stenosis. Aorta: The aortic root is normal in size and structure. Venous: The inferior vena cava is normal in size with greater than 50% respiratory variability, suggesting right atrial pressure of 3 mmHg. IAS/Shunts: No atrial level shunt detected by color flow Doppler.  LEFT VENTRICLE PLAX 2D LVIDd:         5.95 cm      Diastology LVIDs:         4.60 cm      LV e' medial:    7.83 cm/s LV PW:         1.20 cm      LV E/e' medial:  8.8  LV IVS:        1.60 cm      LV e' lateral:   13.40 cm/s LVOT diam:     2.10 cm      LV E/e' lateral: 5.2 LV SV:         41 LV SV Index:   15 LVOT Area:     3.46 cm  LV Volumes (MOD) LV vol d, MOD A2C: 150.0 ml LV vol d, MOD A4C: 165.0 ml LV vol s, MOD A2C: 69.7 ml LV vol s, MOD A4C: 106.5 ml LV SV MOD A2C:     80.3 ml LV SV MOD A4C:     165.0 ml LV SV MOD BP:      66.9 ml RIGHT VENTRICLE RV Basal diam:  3.40 cm RV Mid diam:    2.50 cm RV S prime:     12.80 cm/s TAPSE (M-mode): 2.1 cm LEFT ATRIUM           Index        RIGHT ATRIUM           Index LA diam:      2.70 cm 0.99 cm/m   RA Area:     11.70 cm LA Vol (A2C): 60.0 ml 22.09 ml/m  RA Volume:   26.10 ml  9.61 ml/m LA Vol (A4C): 18.0 ml 6.63 ml/m  AORTIC VALVE AV Area (Vmax):    2.05 cm AV Area (Vmean):   1.87 cm AV Area (VTI):     2.18 cm AV Vmax:           104.00 cm/s AV Vmean:          69.350 cm/s AV VTI:            0.190 m AV Peak Grad:      4.3 mmHg AV Mean Grad:      2.0 mmHg LVOT Vmax:         61.50 cm/s LVOT Vmean:        37.500 cm/s LVOT VTI:          0.119 m LVOT/AV VTI ratio: 0.63  AORTA Ao Root diam: 3.60 cm MITRAL VALVE               TRICUSPID VALVE MV Area (PHT): 3.93 cm    TR Peak grad:   18.1 mmHg MV Area VTI:   1.72 cm    TR Vmax:        213.00 cm/s MV Peak grad:  2.8 mmHg MV Mean grad:  1.0 mmHg    SHUNTS MV Vmax:       0.83 m/s    Systemic VTI:  0.12 m MV Vmean:      54.9 cm/s   Systemic Diam: 2.10 cm MV Decel Time: 193 msec MV E velocity: 69.20 cm/s MV A velocity: 52.90 cm/s MV E/A ratio:  1.31 Marcina Millard MD Electronically signed by Marcina Millard MD Signature Date/Time: 05/21/2023/1:10:31 PM    Final    US ARTERIAL ABI (SCREENING LOWER EXTREMITY)  Result Date: 05/20/2023 CLINICAL DATA:  52 year old male with history of diabetic ulceration. EXAM: NONINVASIVE PHYSIOLOGIC VASCULAR STUDY OF BILATERAL LOWER EXTREMITIES TECHNIQUE: Evaluation of both lower extremities were performed at rest, including calculation of  ankle-brachial indices with single level Doppler, pressure and pulse volume recording. COMPARISON:  None Available. FINDINGS: Right ABI:  1.17 Left ABI:  1.13 Right Lower Extremity:  Normal arterial waveforms at the ankle. Left Lower Extremity:  Normal arterial waveforms at the ankle. IMPRESSION: Normal ankle-brachial indices. No evidence of significant bilateral lower extremity peripheral artery disease. Marliss Coots, MD [facility] Radiology Electronically Signed   By: Marliss Coots M.D.   On: 05/20/2023 12:40   DG Foot Complete Right  Result Date: 05/19/2023 CLINICAL DATA:  Great toe wound. EXAM: RIGHT FOOT COMPLETE - 3+ VIEW COMPARISON:  Radiographs of September 04, 2017.  MRI of May 05, 2023. FINDINGS: There appears to be lytic destruction involving the distal portion of the first proximal phalanx consistent with osteomyelitis. This results in a comminuted pathologic fracture. Dorsal displacement of the first distal phalanx is noted. IMPRESSION: Comminuted pathologic fracture seen involving distal portion of first proximal phalanx secondary to osteomyelitis. Electronically Signed   By: Lupita Raider M.D.   On: 05/19/2023 16:31   US Venous Img Lower Unilateral Right  Result Date: 05/19/2023 CLINICAL DATA:  Right lower extremity pain and swelling EXAM: RIGHT LOWER EXTREMITY VENOUS DOPPLER ULTRASOUND TECHNIQUE: Gray-scale sonography with compression, as well as color and duplex ultrasound, were performed to evaluate the deep venous system(s) from the level of the common femoral vein through the popliteal and proximal calf veins. COMPARISON:  None Available. FINDINGS: VENOUS Normal compressibility of the common femoral, superficial femoral, and popliteal  veins, as well as the visualized calf veins. Visualized portions of profunda femoral vein and great saphenous vein unremarkable. No filling defects to suggest DVT on grayscale or color Doppler imaging.  Doppler waveforms show normal direction of venous flow, normal respiratory plasticity and response to augmentation. Limited views of the contralateral common femoral vein are unremarkable. OTHER Enlarged right inguinal lymph nodes the largest measuring 4.7 x 3.4 x 1.7 cm with fatty hilum. Limitations: none IMPRESSION: 1. No right lower extremity DVT. 2. Enlarged right inguinal lymph nodes. Electronically Signed   By: Larose Hires D.O.   On: 05/19/2023 14:56   MR FOOT RIGHT WO CONTRAST  Result Date: 05/05/2023 CLINICAL DATA:  Diabetic with right great toe ulcer for 2-3 months. No known injury. EXAM: MRI OF THE RIGHT FOREFOOT WITHOUT CONTRAST TECHNIQUE: Multiplanar, multisequence MR imaging of the right forefoot was performed. No intravenous contrast was administered. COMPARISON:  None recent.  Right foot radiographs 09/04/2017. FINDINGS: Bones/Joint/Cartilage The great toe appears mildly hyperextended at the interphalangeal joint, most obvious on the sagittal images. This limits assessment on the axial and coronal images. There is increased T2 marrow signal throughout the proximal and distal phalanges of the great toe as well as suspected decreased T1 signal, especially in the distal phalanx. No definite cortical destruction is identified. There is a small interphalangeal joint effusion. No significant effusion of the metatarsophalangeal joint or abnormality of the 1st metatarsal. No significant abnormalities of the other toes or metatarsals are seen. The alignment is normal at the Lisfranc joint. Ligaments The collateral ligaments of the metatarsophalangeal joints appear intact. The Lisfranc ligament appears intact. Muscles and Tendons Diffuse forefoot muscular fatty atrophy. No significant tenosynovitis. Soft tissues Soft tissue swelling in the great toe with decreased T1 signal within the plantar and medial subcutaneous tissues at the level of the interphalangeal joint. No focal fluid collection or foreign  body identified. IMPRESSION: 1. Soft tissue swelling in the great toe consistent with cellulitis. No focal fluid collection or foreign body identified. 2. Nonspecific marrow edema within the proximal and distal phalanges of the great toe with suspected decreased T1 signal, especially in the distal phalanx, suspicious for osteomyelitis. No cortical destruction is identified. Great toe assessment limited by extension at the interphalangeal joint. Plain film correlation recommended. 3. There is a small interphalangeal joint effusion which could be septic arthritis. 4. No other significant osseous findings. Electronically Signed   By: Carey Bullocks M.D.   On: 05/05/2023 18:28    ECHO 05/2021 ECHOCARDIOGRAPHIC MEASUREMENTS  2D DIMENSIONS  AORTA                  Values   Normal Range   MAIN PA         Values    Normal Range                Annulus: 2.6 cm       [2.3-2.9]         PA Main: nm*       [1.5-2.1]              Aorta Sin: 3.0 cm       [3.1-3.7]    RIGHT VENTRICLE            ST Junction: nm*          [2.6-3.2]         RV Base: nm*       [<4.2]  Asc.Aorta: nm*          [2.6-3.4]          RV Mid: 3.0 cm    [<3.5]  LEFT VENTRICLE                                      RV Length: nm*       [<8.6]                 LVIDd: 6.1 cm       [4.2-5.9]    INFERIOR VENA CAVA                  LVIDs: 5.2 cm                        Max. IVC: nm*       [<=2.1]                    FS: 15.4 %       [>25]            Min. IVC: nm*                    SWT: 1.1 cm       [0.6-1.0]    ------------------                    PWT: 1.1 cm       [0.6-1.0]    nm* - not measured  LEFT ATRIUM                LA Diam: 4.8 cm       [3.0-4.0]            LA A4C Area: nm*          [<20]             LA Volume: nm*          [18-58]  _________________________________________________________________________________________  ECHOCARDIOGRAPHIC DESCRIPTIONS  AORTIC ROOT                   Size: Normal             Dissection: INDETERM  FOR DISSECTION  AORTIC VALVE               Leaflets: Tricuspid                   Morphology: Normal               Mobility: Fully mobile  LEFT VENTRICLE                   Size: MILDLY ENLARGED               Anterior: HYPOCONTRACTILE            Contraction: MOD GLOBAL DECREASE            Lateral: HYPOCONTRACTILE             Closest EF: 30% (Estimated)                 Septal: HYPOCONTRACTILE              LV Masses: No Masses                       Apical: HYPOCONTRACTILE  LVH: None                          Inferior: HYPOCONTRACTILE                                                      Posterior: HYPOCONTRACTILE           Dias.FxClass: N/A  MITRAL VALVE               Leaflets: Normal                        Mobility: Fully mobile             Morphology: Normal  LEFT ATRIUM                   Size: Normal                       LA Masses: No masses              IA Septum: Normal IAS  MAIN PA                   Size: Normal  PULMONIC VALVE             Morphology: Normal                        Mobility: Fully mobile  RIGHT VENTRICLE              RV Masses: No Masses                         Size: Normal              Free Wall: Normal                     Contraction: Normal  TRICUSPID VALVE               Leaflets: Normal                        Mobility: Fully mobile             Morphology: Normal  RIGHT ATRIUM                   Size: Normal                        RA Other: None                RA Mass: No masses  PERICARDIUM                 Fluid: No effusion  INFERIOR VENACAVA                   Size: Normal Normal respiratory collapse  _________________________________________________________________________________________   DOPPLER ECHO and OTHER SPECIAL PROCEDURES                 Aortic: MILD AR                    No AS  108.6 cm/sec peak vel      4.7 mmHg peak grad                 Mitral: TRIVIAL MR                 No MS                          MV Inflow E Vel = 47.1 cm/sec     MV Annulus E'Vel = 5.3 cm/sec                         E/E'Ratio = 8.9              Tricuspid: TRIVIAL TR                 No TS              Pulmonary: MILD PR                    No PS                         133.0 cm/sec peak vel      7.1 mmHg peak grad  _________________________________________________________________________________________  INTERPRETATION  MODERATE LV SYSTOLIC DYSFUNCTION (See above)  NORMAL RIGHT VENTRICULAR SYSTOLIC FUNCTION  NO VALVULAR STENOSIS  TRIVIAL MR, TR  MILD AR, PR  EF 30%  _________________________________________________________________________________________Electronically signed by    Dorothyann Peng, MD on 06/06/2021 04: 03 PM           Performed By: Merla Riches, RDCS     Ordering Physician: Harl Favor   [facility] 01/13/2019 - Dr.Gollan  Procedural Findings: Hemodynamics:  AO 95/69, 80 LV 95/13, 24  Coronary angiography:  Coronary dominance: Right  Left mainstem: Large vessel that bifurcates into the LAD and left circumflex, no significant disease noted  Left anterior descending (LAD): Large vessel that extends to the apical region, diagonal branch 2 of moderate size, no significant disease noted  Left circumflex (LCx): Large vessel with OM branch 2, no significant disease noted  Right coronary artery (RCA): Right dominant vessel with PL and PDA, no significant disease noted  Left ventriculography: Left ventricular systolic function is severely depressed, global hypokinesis, LVEF is estimated at 20%, there is no significant mitral regurgitation , no significant aortic valve stenosis  RA: 7 mean RV 443/10/17 PA: 40/21/29 PCWP: 19 C/O: 5.04 C.I: 2.07  Final Conclusions:  Nonischemic cardiomyopathy, no significant CAD Mildly elevated wedge pressure,  EF severely depressed, global Hypokinesis, EF 20% --continue metoprolol, lisinopril, aldactone   TELEMETRY reviewed by me (LT) 05/21/2023 : None  available for review  EKG reviewed by me: EKG obtained which showed NSR with new TWI in leads III and aVF and TW flattening in V5 & V6, which are new compared to prior from 2020 in [facility]. Found more recent EKG for comparison from [facility] Cardiology clinic visit 05/2021 which demonstrated NSR and similar TWI in the inferolateral leads. Overall reassuring that these abnormalities are chronic in nature. Reviewed with Dr. Darrold Junker - no change to plan.   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  Cardiology is consulted for medical optimization prior to anticipated toe amputation.  # Osteomyelitis of the right foot # Medical optimization prior to surgery Presents with an infection of his right great toe with cellulitic changes to his right leg that have been progressive for the past month despite outpatient antibiotics.  Imaging of his right foot show changes in his right toe consistent with bone destruction and osteomyelitis for which amputation is recommended by podiatry.  His cardiomyopathy dates back to 2020 where his EF was 15-20%, although with optimal GDMT for 2 years his EF improved to 30% in July 2022.  He is able to perform greater than 4 METS of activity and denies active heart failure symptoms  at this time and appears clinically euvolemic.  He has run out of his medications semirecently per his report, so we will aim to start him back on optimal GDMT prior to discharge. -Agree with current therapy per primary team and podiatry -Continue metoprolol XL 12.5 mg pre-, peri-, and postoperatively -Continue atorvastatin 40 mg daily -Risk factor modification: A1c, lipid panel.  LDL above goal at 101. -Echo complete -Patient is at low to moderate risk from a cardiovascular perspective to undergo toe amputation, without further modifiable risk factors or recommendations other than stated above prior to proceeding with surgery.  # Chronic HF with recovered EF Notes a 20 to 30 pound weight gain over the past 6 to 7 months that the patient attributes to not watching his diet.  He denies active heart failure symptoms and appears euvolemic on exam.  Current weight 310 pounds.  Repeat echo this admission actually with further improvements in ejection fraction from 30% in July 2022 up to 45-50% this admission -Continue GDMT with metoprolol XL 25 mg daily -Restart Entresto at 24-26 mg twice daily and furosemide 40 mg daily  -restart spironolactone 12.5 mg daily starting tomorrow - Hold Farxiga in the setting of cellulitis/osteomyelitis but aim to restart on outpatient basis. -Could benefit from addition of GLP-1 on an outpatient basis. -Encourage compliance with medications and he knows to call our office for refills as needed -Encouraged low-salt, low-carb diet -Will arrange for close outpatient follow-up with Dr. Juliann Pares / [facility] heart failure at discharge.    # DM2 A1c from 09/2022 was 8.3% which is still above goal, but showed significant improvement compared to 03/2022 at 11.6%. -Recheck A1c was 7.9%.  Cardiology will sign off. Please haiku with questions or re-engage if needed.    This patient's plan of care was discussed and created with Dr. Darrold Junker and he is in agreement.  Signed: Rebeca Allegra , PA-C 05/21/2023, 1:24 PM [facility]

## 2023-05-21 NOTE — Progress Notes (Signed)
*  PRELIMINARY RESULTS* Echocardiogram 2D Echocardiogram has been performed.  Gary Bentley 05/21/2023, 9:00 AM

## 2023-05-21 NOTE — Progress Notes (Signed)
Daily Progress Note   Subjective  - 1 Day Post-Op  Follow-up right great toe amputation.  No complaints at this time.  Objective Vitals:   05/20/23 1859 05/20/23 1940 05/21/23 0411 05/21/23 0743  BP: (!) 149/85 (!) 148/93 135/86 (!) 144/92  Pulse: 60 62 65 73  Resp: 16 20 18 16   Temp: 98.7 F (37.1 C) 97.9 F (36.6 C) 97.9 F (36.6 C) 97.9 F (36.6 C)  TempSrc:      SpO2: 98% 97% 99% 99%  Weight:   (!) 141.6 kg   Height:        Physical Exam: Amputation site shows good healing.  The incision is well coapted.  No dehiscence at all.  No purulence.  Good perfusion of the skin flaps at this time.  Laboratory CBC    Component Value Date/Time   WBC 5.3 05/21/2023 0502   HGB 10.8 (L) 05/21/2023 0502   HCT 34.6 (L) 05/21/2023 0502   PLT 338 05/21/2023 0502    BMET    Component Value Date/Time   NA 136 05/21/2023 0502   K 3.9 05/21/2023 0502   CL 104 05/21/2023 0502   CO2 23 05/21/2023 0502   GLUCOSE 291 (H) 05/21/2023 0502   BUN 17 05/21/2023 0502   CREATININE 1.18 05/21/2023 0502   CALCIUM 8.1 (L) 05/21/2023 0502   GFRNONAA >60 05/21/2023 0502   GFRAA >60 03/20/2020 1247   Wound culture:RARE WBC PRESENT, PREDOMINANTLY PMN  NO ORGANISMS SEEN  Assessment/Planning: Status post right great toe amputation for osteomyelitis Diabetes with neuropathy and ulceration Patient is status post right great toe amputation.  The wound looks excellent at this time.  There is no signs of dehiscence.  This was a therapeutic amputation proximal to the osteomyelitis.  Possible residual superficial bacterial infection could remain but this would be susceptible to oral antibiotics. New dressing applied today.  Patient was instructed to keep the dressing clean dry and intact.  If this becomes soiled or wet he can change the bandage himself. Try to minimize ambulation and only weight-bear to heel and should use postoperative shoe for weightbearing.  Try to use walker or wheelchair if  possible. From podiatry standpoint patient is stable for discharge.  He should follow-up next week with Tuesday for dressing change and reevaluation.  Gwyneth Revels A  05/21/2023, 2:28 PM

## 2023-05-21 NOTE — Progress Notes (Signed)
Initial Nutrition Assessment  DOCUMENTATION CODES:   Obesity unspecified  INTERVENTION:   -Ensure Max po daily, each supplement provides 150 kcal and 30 grams of protein -Double protein portions with meals -Liberalize diet to carb modified for wider variety of meal selections -MVI with minerals daily -500 mg vitamin C BID -220 gm zinc sulfate daily x 14 days  NUTRITION DIAGNOSIS:   Increased nutrient needs related to post-op healing as evidenced by estimated needs.  GOAL:   Patient will meet greater than or equal to 90% of their needs  MONITOR:   PO intake, Supplement acceptance  REASON FOR ASSESSMENT:   Consult Assessment of nutrition requirement/status, Wound healing  ASSESSMENT:   Pt with medical history significant of type 2 diabetes, hypertension, HFrEF with last EF of 30% (2022), who presents due to wound infection.  Pt admitted with acute osteomyelitis.   6/27- s/p Amputation right great toe first MTPJ   Spoke with pt at bedside, who reports feeling better today. He shares that he has a great appetite and that he is "always hungry". PTA he consumes 2-3 meals per day; admits to consuming large portions, but meals mainly consist of grilled meats a lot of vegetables. Pt is very active, works at a [employer], broadcasting/film/video. Noted meal completions 100%.   Pt denies any weight loss; weight has been stable "for years".   Discussed importance of good meal and supplement intake to promote healing, especially related to post-op healing. Pt amenable to supplements.   Medications reviewed and include lasix and aldactone.   Lab Results  Component Value Date   HGBA1C 7.9 (H) 05/19/2023   PTA DM medications are 10 units insulin aspart protamine-aspart BID.   Labs reviewed: CBGS: 206 (inpatient orders for glycemic control are 0-9 units insulin aspart TID with meals and 10 units insulin aspart protamine-aspart BID).    NUTRITION - FOCUSED PHYSICAL EXAM:  Flowsheet Row Most Recent  Value  Orbital Region No depletion  Upper Arm Region No depletion  Thoracic and Lumbar Region No depletion  Buccal Region No depletion  Temple Region No depletion  Clavicle Bone Region No depletion  Clavicle and Acromion Bone Region No depletion  Scapular Bone Region No depletion  Dorsal Hand No depletion  Patellar Region No depletion  Anterior Thigh Region No depletion  Posterior Calf Region No depletion  Edema (RD Assessment) Mild  Hair Reviewed  Eyes Reviewed  Mouth Reviewed  Skin Reviewed  Nails Reviewed       Diet Order:   Diet Order             Diet Carb Modified Fluid consistency: Thin  Diet effective now                   EDUCATION NEEDS:   Education needs have been addressed  Skin:  Skin Assessment: Skin Integrity Issues: Skin Integrity Issues:: Incisions Incisions: s/p rt great toe amputation  Last BM:  05/19/23  Height:   Ht Readings from Last 1 Encounters:  05/20/23 6' 5.5" (1.969 m)    Weight:   Wt Readings from Last 1 Encounters:  05/21/23 (!) 141.6 kg    Ideal Body Weight:  95.9 kg  BMI:  Body mass index is 36.54 kg/m.  Estimated Nutritional Needs:   Kcal:  2400-2600  Protein:  125-150 grams  Fluid:  > 2 L    Levada Schilling, RD, LDN, CDCES Registered Dietitian II Certified Diabetes Care and Education Specialist Please refer to [facility] for RD and/or  RD on-call/weekend/after hours pager

## 2023-05-21 NOTE — Progress Notes (Signed)
Patient move himself to EOB. Patient complaining of mild right calf soreness and tightness. Patient states right calf pain was there prior to surgery and MD is aware. Patient also states he feels some edema at operative site. RN encouraged patient to elevate extremity in bed and recliner chair to reduce postop swelling. Patient at this time declined moving to chair.   Madie Reno, RN

## 2023-05-22 DIAGNOSIS — M8610 Other acute osteomyelitis, unspecified site: Secondary | ICD-10-CM | POA: Diagnosis not present

## 2023-05-22 LAB — CBC
HCT: 35.1 % — ABNORMAL LOW (ref 39.0–52.0)
Hemoglobin: 10.9 g/dL — ABNORMAL LOW (ref 13.0–17.0)
MCH: 30.1 pg (ref 26.0–34.0)
MCHC: 31.1 g/dL (ref 30.0–36.0)
MCV: 97 fL (ref 80.0–100.0)
Platelets: 370 10*3/uL (ref 150–400)
RBC: 3.62 MIL/uL — ABNORMAL LOW (ref 4.22–5.81)
RDW: 12.3 % (ref 11.5–15.5)
WBC: 5 10*3/uL (ref 4.0–10.5)
nRBC: 0 % (ref 0.0–0.2)

## 2023-05-22 LAB — BASIC METABOLIC PANEL
Anion gap: 6 (ref 5–15)
BUN: 22 mg/dL — ABNORMAL HIGH (ref 6–20)
CO2: 25 mmol/L (ref 22–32)
Calcium: 8.9 mg/dL (ref 8.9–10.3)
Chloride: 104 mmol/L (ref 98–111)
Creatinine, Ser: 1.28 mg/dL — ABNORMAL HIGH (ref 0.61–1.24)
GFR, Estimated: >60 mL/min (ref >60)
Glucose, Bld: 296 mg/dL — ABNORMAL HIGH (ref 70–99)
Potassium: 4.1 mmol/L (ref 3.5–5.1)
Sodium: 135 mmol/L (ref 135–145)

## 2023-05-22 LAB — GLUCOSE, CAPILLARY
Glucose-Capillary: 249 mg/dL — ABNORMAL HIGH (ref 70–99)
Glucose-Capillary: 253 mg/dL — ABNORMAL HIGH (ref 70–99)

## 2023-05-22 LAB — AEROBIC/ANAEROBIC CULTURE W GRAM STAIN (SURGICAL/DEEP WOUND)

## 2023-05-22 MED ORDER — POTASSIUM CHLORIDE CRYS ER 20 MEQ PO TBCR: 20.0000 meq | EXTENDED_RELEASE_TABLET | ORAL | 0 refills | Status: DC | PRN

## 2023-05-22 MED ORDER — METOPROLOL SUCCINATE ER 50 MG PO TB24
50.0000 mg | ORAL_TABLET | Freq: Every day | ORAL | 0 refills | Status: DC
Start: 2023-05-22 — End: 2023-07-22

## 2023-05-22 MED ORDER — SPIRONOLACTONE 25 MG PO TABS
12.5000 mg | ORAL_TABLET | Freq: Every day | ORAL | 0 refills | Status: DC
Start: 2023-05-22 — End: 2023-07-22

## 2023-05-22 MED ORDER — CEPHALEXIN 500 MG PO CAPS: 500.0000 mg | ORAL_CAPSULE | Freq: Four times a day (QID) | ORAL | 0 refills | Status: AC

## 2023-05-22 MED ORDER — FUROSEMIDE 40 MG PO TABS
40.0000 mg | ORAL_TABLET | Freq: Every day | ORAL | 0 refills | Status: DC
Start: 2023-05-22 — End: 2023-07-22

## 2023-05-22 NOTE — Progress Notes (Signed)
Gary Bentley to be discharged Home per MD order. Discussed prescriptions and follow up appointments with the patient. Prescriptions given to patient, medication list explained in detail. Patient verbalized understanding.  Allergies as of 05/22/2023       Reactions   Bee Venom Anaphylaxis        Medication List     STOP taking these medications    dapagliflozin propanediol 10 MG Tabs tablet Commonly known as: Farxiga       TAKE these medications    cephALEXin 500 MG capsule Commonly known as: KEFLEX Take 1 capsule (500 mg total) by mouth 4 (four) times daily for 7 days.   furosemide 40 MG tablet Commonly known as: Lasix Take 1 tablet (40 mg total) by mouth daily. What changed: when to take this   insulin aspart protamine - aspart (70-30) 100 UNIT/ML FlexPen Commonly known as: NovoLOG Mix 70/30 FlexPen Inject 0.1 mLs (10 Units total) into the skin 2 (two) times daily.   isosorbide mononitrate 30 MG 24 hr tablet Commonly known as: IMDUR Take 1 tablet (30 mg total) by mouth daily.   meloxicam 15 MG tablet Commonly known as: MOBIC Take 15 mg by mouth daily.   metoprolol succinate 50 MG 24 hr tablet Commonly known as: TOPROL-XL Take 1 tablet (50 mg total) by mouth daily. Take with or immediately following a meal.   potassium chloride SA 20 MEQ tablet Commonly known as: KLOR-CON M Take 1 tablet (20 mEq total) by mouth as needed. With lasix What changed: how much to take   Procysbi 300 MG Pack Generic drug: Cysteamine Bitartrate Use 1 each 3 (three) times daily Use as instructed. ONE TOUCH DELICA LANCETS E11.9   sacubitril-valsartan 49-51 MG Commonly known as: ENTRESTO Take 1 tablet by mouth 2 (two) times daily.   spironolactone 25 MG tablet Commonly known as: ALDACTONE Take 0.5 tablets (12.5 mg total) by mouth daily.        Vitals:   05/22/23 0421 05/22/23 0837  BP: 134/84 127/79  Pulse: 67 (!) 52  Resp: 18 16  Temp: 97.6 F (36.4 C) 98.7 F (37.1  C)  SpO2: 97% 100%    Skin clean, dry and intact without evidence of skin break down and or skin tears. IV catheter discontinued intact. Site without signs and symptoms of complications. Dressing and pressure applied. Patient denies pain at this time. No complaints noted.  An After Visit Summary was printed and given to the patient. Patient escorted via wheelchair and discharged Home home via private auto.  Madie Reno, RN

## 2023-05-22 NOTE — Plan of Care (Signed)
  Problem: Health Behavior/Discharge Planning: Goal: Ability to identify and utilize available resources and services will improve Outcome: Progressing   

## 2023-05-22 NOTE — Discharge Summary (Signed)
Physician Discharge Summary  Gary Bentley ZOX:[id number] DOB: [birth year] DOA: 05/19/2023  PCP: Gracelyn Nurse, MD  Admit date: 05/19/2023 Discharge date: 05/22/2023 Recommendations for Outpatient Follow-up:  Follow up with PCP in 1 weeks-call for appointment Please obtain BMP/CBC in one week Follow-up with orthopedics for postop wound check and dressing.   Patient was instructed to keep the dressing clean dry and intact.  If this becomes soiled or wet he can change the bandage himself. Try to minimize ambulation and only weight-bear to heel and should use postoperative shoe for weightbearing.  Try to use walker or wheelchair if possible  Discharge Dispo: home Discharge Condition: Stable Code Status:   Code Status: Full Code Diet recommendation:  Diet Order             Diet Carb Modified Fluid consistency: Thin  Diet effective now                    Brief/Interim Summary: 52 y.o. male with medical history significant of type 2 diabetes, hypertension, HFrEF with last EF of 30% (2022), who presents to the ED due to wound infection on rt great toe for several months but over the last month or so, he has noticed that the redness and swelling has began to expand into his ankle and up his calf w/ malodorous purulent discharge from his great toe despite antibiotics.  In ED: bp 116/78 with heart rate of 107.  Heart rate improved to 85 with no intervention.  He was saturating at 96% on room air.  He was afebrile at 98.4.  Labs:WBC at 7.9, hemoglobin of 10.9, glucose of 134, creatinine 1.44 with GFR 59.  Lower extremity Doppler >> negative for DVT. Right foot x-ray>>evidence of osteomyelitis. Started on Zosyn with podiatry consultation and admitted for further management.  Patient underwent amputation of the toe postop doing well.  Per Ortho ability to therapeutic amputation may have superimposed infection that should be covered by oral antibiotics they will arrange for outpatient follow-up  and okay for discharge   Discharge Diagnoses:  Principal Problem:   Acute osteomyelitis (HCC) Active Problems:   Diabetes mellitus due to underlying condition with foot ulcer (CODE) (HCC)   HFrEF (heart failure with reduced ejection fraction) (HCC)   CKD (chronic kidney disease) stage 2, GFR 60-89 ml/min   Essential hypertension   Normocytic anemia  Acute osteomyelitis of right great toe Diabetic neuropathy Diabetic foot ulceration with infection ongoing since February: Appreciate podiatry follow-up, ABI unremarkable underwent amputation of right great toe first MTPJ 6/27-per orthopedics it was therapeutic amputation, possible residual superficial better infection could remain but this could be susceptible to oral antibiotics SO WE WILL discharge on oral antibiotics.  IntraOp culture no growth so far Ccontinue dressing change and orthopedic follow-up as outpatient He has previously completed a course of cefdinir (February 2024) and Augmentin on 04/29/2023. Previous culture data demonstrated MSSA, Morganella and Proteus.   Diabetes mellitus due to underlying condition with foot ulcer:  Last A1c approximately 7 months ago was 8.3%.  PTA on 70/30 INSULIN 10 units twice daily and [place].  He will resume his in the regimen and follow-up with PCP to consider further up titration if needed Recent Labs  Lab 05/19/23 1334 05/19/23 1916 05/21/23 0745 05/21/23 1123 05/21/23 1603 05/21/23 2055 05/22/23 0839  GLUCAP  --    < > 269* 206* 210* 218* 249*  HGBA1C 7.9*  --   --   --   --   --   --    < > =  values in this interval not displayed.    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  Seen by cardiology GDMT with Toprol 25, Entresto, Lasix with plan to resume Aldactone and holding Farxiga until being evaluated outpatient.  He will be followed by cardiology as outpatient   CKD 2: Baseline creatinine at 1.15 in 2023, on  admission 1.4 downtrending at 1.2, follow-up outpatient.    Essential hypertension BP controlled-continue GDMT/meds per cardiology   Normocytic anemia Hb in 13.4 in May 2023 and is currently 10 gm range, monitor  Class I Obesity:Patient's Body mass index is 36.54 kg/m. : Will benefit with PCP follow-up, weight loss  healthy lifestyle and outpatient sleep evaluation.  Consults: Podiatry, cardiology Subjective: Seen and examined no new complaints  Feels ready for home today  Discharge Exam: Vitals:   05/22/23 0421 05/22/23 0837  BP: 134/84 127/79  Pulse: 67 (!) 52  Resp: 18 16  Temp: 97.6 F (36.4 C) 98.7 F (37.1 C)  SpO2: 97% 100%   General: Pt is alert, awake, not in acute distress Cardiovascular: RRR, S1/S2 +, no rubs, no gallops Respiratory: CTA bilaterally, no wheezing, no rhonchi Abdominal: Soft, NT, ND, bowel sounds + Extremities: no edema, no cyanosis  Discharge Instructions  Discharge Instructions     (HEART FAILURE PATIENTS) Call MD:  Anytime you have any of the following symptoms: 1) 3 pound weight gain in 24 hours or 5 pounds in 1 week 2) shortness of breath, with or without a dry hacking cough 3) swelling in the hands, feet or stomach 4) if you have to sleep on extra pillows at night in order to breathe.   Complete by: As directed    Discharge instructions   Complete by: As directed    Please call call MD or return to ER for similar or worsening recurring problem that brought you to hospital or if any fever,nausea/vomiting,abdominal pain, uncontrolled pain, chest pain,  shortness of breath or any other alarming symptoms.  Please follow-up your doctor as instructed in a week time and call the office for appointment.  Please avoid alcohol, smoking, or any other illicit substance and maintain healthy habits including taking your regular medications as prescribed.  You were cared for by a hospitalist during your hospital stay. If you have any questions about  your discharge medications or the care you received while you were in the hospital after you are discharged, you can call the unit and ask to speak with the hospitalist on call if the hospitalist that took care of you is not available.  Once you are discharged, your primary care physician will handle any further medical issues. Please note that NO REFILLS for any discharge medications will be authorized once you are discharged, as it is imperative that you return to your primary care physician (or establish a relationship with a primary care physician if you do not have one) for your aftercare needs so that they can reassess your need for medications and monitor your lab values.   Increase activity slowly   Complete by: As directed       Allergies as of 05/22/2023       Reactions   Bee Venom Anaphylaxis        Medication List     STOP taking these medications    dapagliflozin propanediol 10 MG Tabs tablet Commonly known as: Farxiga       TAKE these medications    cephALEXin 500  MG capsule Commonly known as: KEFLEX Take 1 capsule (500 mg total) by mouth 4 (four) times daily for 7 days.   furosemide 40 MG tablet Commonly known as: Lasix Take 1 tablet (40 mg total) by mouth daily. What changed: when to take this   insulin aspart protamine - aspart (70-30) 100 UNIT/ML FlexPen Commonly known as: NovoLOG Mix 70/30 FlexPen Inject 0.1 mLs (10 Units total) into the skin 2 (two) times daily.   isosorbide mononitrate 30 MG 24 hr tablet Commonly known as: IMDUR Take 1 tablet (30 mg total) by mouth daily.   meloxicam 15 MG tablet Commonly known as: MOBIC Take 15 mg by mouth daily.   metoprolol succinate 50 MG 24 hr tablet Commonly known as: TOPROL-XL Take 1 tablet (50 mg total) by mouth daily. Take with or immediately following a meal.   potassium chloride SA 20 MEQ tablet Commonly known as: KLOR-CON M Take 1 tablet (20 mEq total) by mouth as needed. With lasix What  changed: how much to take   Procysbi 300 MG Pack Generic drug: Cysteamine Bitartrate Use 1 each 3 (three) times daily Use as instructed. ONE TOUCH DELICA LANCETS E11.9   sacubitril-valsartan 49-51 MG Commonly known as: ENTRESTO Take 1 tablet by mouth 2 (two) times daily.   spironolactone 25 MG tablet Commonly known as: ALDACTONE Take 0.5 tablets (12.5 mg total) by mouth daily.        Follow-up Information     Alwyn Pea, MD. Go in 1 week(s).   Specialties: Cardiology, Internal Medicine Contact information: [address] [place] [address] [phone]         Gracelyn Nurse, MD Follow up in 1 week(s).   Specialty: Internal Medicine Contact information: [address] [place] [address] [phone]         Gwyneth Revels, DPM Follow up in 3 day(s).   Specialty: Podiatry Contact information: [address] [place] [address] [phone]                Allergies  Allergen Reactions   Bee Venom Anaphylaxis    The results of significant diagnostics from this hospitalization (including imaging, microbiology, ancillary and laboratory) are listed below for reference.    Microbiology: Recent Results (from the past 240 hour(s))  Aerobic/Anaerobic Culture w Gram Stain (surgical/deep wound)     Status: None (Preliminary result)   Collection Time: 05/20/23  5:38 PM   Specimen: Path Tissue  Result Value Ref Range Status   Specimen Description   Final    TISSUE Performed at [facility], [address] [place], [place] [address]    Special Requests   Final    NONE Performed at [facility], [address] [place], [place] [address]    Gram Stain   Final    RARE WBC PRESENT, PREDOMINANTLY PMN NO ORGANISMS SEEN    Culture   Final    NO GROWTH < 12 HOURS Performed at [facility], [address], [place], [place] [address]    Report Status PENDING  Incomplete     Procedures/Studies: ECHOCARDIOGRAM COMPLETE  Result Date: 05/21/2023    ECHOCARDIOGRAM REPORT   Patient Name:   BRELON VENTEICHER Date of Exam: 05/21/2023 Medical Rec #:  [id number]      Height:       77.5 in Accession #:    [id number]     Weight:       312.2 lb Date of Birth:  02/01/1971     BSA:          2.716 m Patient Age:    51 years       BP:           135/86 mmHg Patient Gender: M              HR:           65 bpm. Exam Location:  [facility] Procedure: 2D Echo, Cardiac Doppler and Color Doppler Indications:     Nonischemic cardiomyopathy I42.8  History:         Patient has prior history of Echocardiogram examinations, most                  recent 01/10/2019. CHF; Risk Factors:Diabetes and Hypertension.  Sonographer:     Cristela Blue Referring Phys:  [id number] JUSTIN FOWLER Diagnosing Phys: Marcina Millard MD IMPRESSIONS  1. Left ventricular ejection fraction, by estimation, is 45 to 50%. The left ventricle has mildly decreased function. The left ventricle has no regional wall motion abnormalities. Left ventricular diastolic parameters were normal.  2. Right ventricular systolic function is normal. The right ventricular size is normal.  3. The mitral valve is normal in structure. Trivial mitral valve regurgitation. No evidence of mitral stenosis.  4. The aortic valve is normal in structure. Aortic valve regurgitation is not visualized. No aortic stenosis is present.  5. The inferior vena cava is normal in size with greater than 50% respiratory variability, suggesting right atrial pressure of 3 mmHg. FINDINGS  Left Ventricle: Left ventricular ejection fraction, by estimation, is 45 to 50%. The left ventricle has mildly decreased function. The left ventricle has no regional wall motion abnormalities. The left ventricular internal cavity size was normal in size. There is no left ventricular hypertrophy. Left ventricular diastolic parameters were normal. Right Ventricle: The right ventricular size is normal. No increase  in right ventricular wall thickness. Right ventricular systolic function is normal. Left Atrium: Left atrial size was normal in size. Right Atrium: Right atrial size was normal in size. Pericardium: There is no evidence of pericardial effusion. Mitral Valve: The mitral valve is normal in structure. Trivial mitral valve regurgitation. No evidence of mitral valve stenosis. MV peak gradient, 2.8 mmHg. The mean mitral valve gradient is 1.0 mmHg. Tricuspid Valve: The tricuspid valve is normal in structure. Tricuspid valve regurgitation is trivial. No evidence of tricuspid stenosis. Aortic Valve: The aortic valve is normal in structure. Aortic valve regurgitation is not visualized. No aortic stenosis is present. Aortic valve mean gradient measures 2.0 mmHg. Aortic valve peak gradient measures 4.3 mmHg. Aortic valve area, by VTI measures 2.18 cm. Pulmonic Valve: The pulmonic valve was normal in structure. Pulmonic valve regurgitation is not visualized. No evidence of pulmonic stenosis. Aorta: The aortic root is normal in size and structure. Venous: The inferior vena cava is normal in size with greater than 50% respiratory variability, suggesting right atrial pressure of 3 mmHg. IAS/Shunts: No atrial level shunt detected by color flow Doppler.  LEFT VENTRICLE PLAX 2D LVIDd:         5.95 cm      Diastology LVIDs:         4.60 cm      LV e' medial:    7.83 cm/s LV PW:         1.20 cm      LV E/e' medial:  8.8 LV IVS:        1.60 cm  LV e' lateral:   13.40 cm/s LVOT diam:     2.10 cm      LV E/e' lateral: 5.2 LV SV:         41 LV SV Index:   15 LVOT Area:     3.46 cm  LV Volumes (MOD) LV vol d, MOD A2C: 150.0 ml LV vol d, MOD A4C: 165.0 ml LV vol s, MOD A2C: 69.7 ml LV vol s, MOD A4C: 106.5 ml LV SV MOD A2C:     80.3 ml LV SV MOD A4C:     165.0 ml LV SV MOD BP:      66.9 ml RIGHT VENTRICLE RV Basal diam:  3.40 cm RV Mid diam:    2.50 cm RV S prime:     12.80 cm/s TAPSE (M-mode): 2.1 cm LEFT ATRIUM           Index         RIGHT ATRIUM           Index LA diam:      2.70 cm 0.99 cm/m   RA Area:     11.70 cm LA Vol (A2C): 60.0 ml 22.09 ml/m  RA Volume:   26.10 ml  9.61 ml/m LA Vol (A4C): 18.0 ml 6.63 ml/m  AORTIC VALVE AV Area (Vmax):    2.05 cm AV Area (Vmean):   1.87 cm AV Area (VTI):     2.18 cm AV Vmax:           104.00 cm/s AV Vmean:          69.350 cm/s AV VTI:            0.190 m AV Peak Grad:      4.3 mmHg AV Mean Grad:      2.0 mmHg LVOT Vmax:         61.50 cm/s LVOT Vmean:        37.500 cm/s LVOT VTI:          0.119 m LVOT/AV VTI ratio: 0.63  AORTA Ao Root diam: 3.60 cm MITRAL VALVE               TRICUSPID VALVE MV Area (PHT): 3.93 cm    TR Peak grad:   18.1 mmHg MV Area VTI:   1.72 cm    TR Vmax:        213.00 cm/s MV Peak grad:  2.8 mmHg MV Mean grad:  1.0 mmHg    SHUNTS MV Vmax:       0.83 m/s    Systemic VTI:  0.12 m MV Vmean:      54.9 cm/s   Systemic Diam: 2.10 cm MV Decel Time: 193 msec MV E velocity: 69.20 cm/s MV A velocity: 52.90 cm/s MV E/A ratio:  1.31 Marcina Millard MD Electronically signed by Marcina Millard MD Signature Date/Time: 05/21/2023/1:10:31 PM    Final    US ARTERIAL ABI (SCREENING LOWER EXTREMITY)  Result Date: 05/20/2023 CLINICAL DATA:  52 year old male with history of diabetic ulceration. EXAM: NONINVASIVE PHYSIOLOGIC VASCULAR STUDY OF BILATERAL LOWER EXTREMITIES TECHNIQUE: Evaluation of both lower extremities were performed at rest, including calculation of ankle-brachial indices with single level Doppler, pressure and pulse volume recording. COMPARISON:  None Available. FINDINGS: Right ABI:  1.17 Left ABI:  1.13 Right Lower Extremity:  Normal arterial waveforms at the ankle. Left Lower Extremity:  Normal arterial waveforms at the ankle. IMPRESSION: Normal ankle-brachial indices. No evidence of significant bilateral lower extremity peripheral artery disease. Dylan  Elby Showers, MD [facility] Electronically Signed   By: Marliss Coots M.D.   On: 05/20/2023 12:40   DG Foot Complete Right  Result Date: 05/19/2023 CLINICAL DATA:  Great toe wound. EXAM: RIGHT FOOT COMPLETE - 3+ VIEW COMPARISON:  Radiographs of September 04, 2017.  MRI of May 05, 2023. FINDINGS: There appears to be lytic destruction involving the distal portion of the first proximal phalanx consistent with osteomyelitis. This results in a comminuted pathologic fracture. Dorsal displacement of the first distal phalanx is noted. IMPRESSION: Comminuted pathologic fracture seen involving distal portion of first proximal phalanx secondary to osteomyelitis. Electronically Signed   By: Lupita Raider M.D.   On: 05/19/2023 16:31   US Venous Img Lower Unilateral Right  Result Date: 05/19/2023 CLINICAL DATA:  Right lower extremity pain and swelling EXAM: RIGHT LOWER EXTREMITY VENOUS DOPPLER ULTRASOUND TECHNIQUE: Gray-scale sonography with compression, as well as color and duplex ultrasound, were performed to evaluate the deep venous system(s) from the level of the common femoral vein through the popliteal and proximal calf veins. COMPARISON:  None Available. FINDINGS: VENOUS Normal compressibility of the common femoral, superficial femoral, and popliteal veins, as well as the visualized calf veins. Visualized portions of profunda femoral vein and great saphenous vein unremarkable. No filling defects to suggest DVT on grayscale or color Doppler imaging. Doppler waveforms show normal direction of venous flow, normal respiratory plasticity and response to augmentation. Limited views of the contralateral common femoral vein are unremarkable. OTHER Enlarged right inguinal lymph nodes the largest measuring 4.7 x 3.4 x 1.7 cm with fatty hilum. Limitations: none IMPRESSION: 1. No right lower extremity DVT. 2. Enlarged right inguinal lymph nodes. Electronically Signed   By: Larose Hires D.O.   On: 05/19/2023 14:56   MR FOOT RIGHT WO CONTRAST  Result Date: 05/05/2023 CLINICAL DATA:   Diabetic with right great toe ulcer for 2-3 months. No known injury. EXAM: MRI OF THE RIGHT FOREFOOT WITHOUT CONTRAST TECHNIQUE: Multiplanar, multisequence MR imaging of the right forefoot was performed. No intravenous contrast was administered. COMPARISON:  None recent.  Right foot radiographs 09/04/2017. FINDINGS: Bones/Joint/Cartilage The great toe appears mildly hyperextended at the interphalangeal joint, most obvious on the sagittal images. This limits assessment on the axial and coronal images. There is increased T2 marrow signal throughout the proximal and distal phalanges of the great toe as well as suspected decreased T1 signal, especially in the distal phalanx. No definite cortical destruction is identified. There is a small interphalangeal joint effusion. No significant effusion of the metatarsophalangeal joint or abnormality of the 1st metatarsal. No significant abnormalities of the other toes or metatarsals are seen. The alignment is normal at the Lisfranc joint. Ligaments The collateral ligaments of the metatarsophalangeal joints appear intact. The Lisfranc ligament appears intact. Muscles and Tendons Diffuse forefoot muscular fatty atrophy. No significant tenosynovitis. Soft tissues Soft tissue swelling in the great toe with decreased T1 signal within the plantar and medial subcutaneous tissues at the level of the interphalangeal joint. No focal fluid collection or foreign body identified. IMPRESSION: 1. Soft tissue swelling in the great toe consistent with cellulitis. No focal fluid collection or foreign body identified. 2. Nonspecific marrow edema within the proximal and distal phalanges of the great toe with suspected decreased T1 signal, especially in the distal phalanx, suspicious for osteomyelitis. No cortical destruction is identified. Great toe assessment limited by extension at the interphalangeal joint. Plain film correlation recommended. 3. There is a small interphalangeal  joint effusion  which could be septic arthritis. 4. No other significant osseous findings. Electronically Signed   By: Carey Bullocks M.D.   On: 05/05/2023 18:28    Labs: BNP (last 3 results) No results for input(s): "BNP" in the last 8760 hours. Basic Metabolic Panel: Recent Labs  Lab 05/19/23 1334 05/20/23 0412 05/21/23 0502 05/22/23 0434  NA 137 137 136 135  K 3.6 3.5 3.9 4.1  CL 106 108 104 104  CO2 23 23 23 25   GLUCOSE 134* 101* 291* 296*  BUN 19 16 17  22*  CREATININE 1.44* 1.27* 1.18 1.28*  CALCIUM 8.9 8.5* 8.1* 8.9   Liver Function Tests: No results for input(s): "AST", "ALT", "ALKPHOS", "BILITOT", "PROT", "ALBUMIN" in the last 168 hours. No results for input(s): "LIPASE", "AMYLASE" in the last 168 hours. No results for input(s): "AMMONIA" in the last 168 hours. CBC: Recent Labs  Lab 05/19/23 1334 05/20/23 0412 05/21/23 0502 05/22/23 0434  WBC 7.9 7.8 5.3 5.0  HGB 10.9* 9.7* 10.8* 10.9*  HCT 35.0* 31.7* 34.6* 35.1*  MCV 98.3 100.3* 98.9 97.0  PLT 392 375 338 370   Cardiac Enzymes: No results for input(s): "CKTOTAL", "CKMB", "CKMBINDEX", "TROPONINI" in the last 168 hours. BNP: Invalid input(s): "POCBNP" CBG: Recent Labs  Lab 05/21/23 0745 05/21/23 1123 05/21/23 1603 05/21/23 2055 05/22/23 0839  GLUCAP 269* 206* 210* 218* 249*   D-Dimer No results for input(s): "DDIMER" in the last 72 hours. Hgb A1c Recent Labs    05/19/23 1334  HGBA1C 7.9*   Lipid Profile Recent Labs    05/20/23 0412  CHOL 153  HDL 36*  LDLCALC 101*  TRIG 81  CHOLHDL 4.3   Thyroid function studies No results for input(s): "TSH", "T4TOTAL", "T3FREE", "THYROIDAB" in the last 72 hours.  Invalid input(s): "FREET3" Anemia work up No results for input(s): "VITAMINB12", "FOLATE", "FERRITIN", "TIBC", "IRON", "RETICCTPCT" in the last 72 hours. Urinalysis    Component Value Date/Time   COLORURINE AMBER (A) 02/04/2019 1921   APPEARANCEUR CLEAR (A) 02/04/2019 1921   LABSPEC 1.039 (H)  02/04/2019 1921   PHURINE 6.0 02/04/2019 1921   GLUCOSEU NEGATIVE 02/04/2019 1921   HGBUR NEGATIVE 02/04/2019 1921   BILIRUBINUR NEGATIVE 02/04/2019 1921   KETONESUR NEGATIVE 02/04/2019 1921   PROTEINUR 100 (A) 02/04/2019 1921   NITRITE NEGATIVE 02/04/2019 1921   LEUKOCYTESUR NEGATIVE 02/04/2019 1921   Sepsis Labs Recent Labs  Lab 05/19/23 1334 05/20/23 0412 05/21/23 0502 05/22/23 0434  WBC 7.9 7.8 5.3 5.0   Microbiology Recent Results (from the past 240 hour(s))  Aerobic/Anaerobic Culture w Gram Stain (surgical/deep wound)     Status: None (Preliminary result)   Collection Time: 05/20/23  5:38 PM   Specimen: Path Tissue  Result Value Ref Range Status   Specimen Description   Final    TISSUE Performed at [facility], [address] [place], [place] [address]    Special Requests   Final    NONE Performed at [facility], [address] [place], [place] [address]    Gram Stain   Final    RARE WBC PRESENT, PREDOMINANTLY PMN NO ORGANISMS SEEN    Culture   Final    NO GROWTH < 12 HOURS Performed at [facility], [address] [place], [place] [address]    Report Status PENDING  Incomplete     Time coordinating discharge: 25 minutes  SIGNED: Lanae Boast, MD  [facility] 05/22/2023, 8:56 AM  If 7PM-7AM, please contact night-coverage  [email or web address]

## 2023-05-24 LAB — AEROBIC/ANAEROBIC CULTURE W GRAM STAIN (SURGICAL/DEEP WOUND)

## 2023-05-25 DIAGNOSIS — M869 Osteomyelitis, unspecified: Secondary | ICD-10-CM | POA: Diagnosis not present

## 2023-05-25 DIAGNOSIS — Z9889 Other specified postprocedural states: Secondary | ICD-10-CM | POA: Diagnosis not present

## 2023-05-25 DIAGNOSIS — E1142 Type 2 diabetes mellitus with diabetic polyneuropathy: Secondary | ICD-10-CM | POA: Diagnosis not present

## 2023-05-25 DIAGNOSIS — Z89412 Acquired absence of left great toe: Secondary | ICD-10-CM | POA: Diagnosis not present

## 2023-05-25 LAB — AEROBIC/ANAEROBIC CULTURE W GRAM STAIN (SURGICAL/DEEP WOUND)

## 2023-06-07 DIAGNOSIS — Z720 Tobacco use: Secondary | ICD-10-CM | POA: Diagnosis not present

## 2023-06-07 DIAGNOSIS — I428 Other cardiomyopathies: Secondary | ICD-10-CM | POA: Diagnosis not present

## 2023-06-07 DIAGNOSIS — I5020 Unspecified systolic (congestive) heart failure: Secondary | ICD-10-CM | POA: Diagnosis not present

## 2023-06-07 DIAGNOSIS — I10 Essential (primary) hypertension: Secondary | ICD-10-CM | POA: Diagnosis not present

## 2023-06-10 DIAGNOSIS — M869 Osteomyelitis, unspecified: Secondary | ICD-10-CM | POA: Diagnosis not present

## 2023-06-10 DIAGNOSIS — E1142 Type 2 diabetes mellitus with diabetic polyneuropathy: Secondary | ICD-10-CM | POA: Diagnosis not present

## 2023-06-17 DIAGNOSIS — I509 Heart failure, unspecified: Secondary | ICD-10-CM | POA: Diagnosis not present

## 2023-06-17 DIAGNOSIS — E119 Type 2 diabetes mellitus without complications: Secondary | ICD-10-CM | POA: Diagnosis not present

## 2023-06-17 DIAGNOSIS — Z0001 Encounter for general adult medical examination with abnormal findings: Secondary | ICD-10-CM | POA: Diagnosis not present

## 2023-06-17 DIAGNOSIS — I10 Essential (primary) hypertension: Secondary | ICD-10-CM | POA: Diagnosis not present

## 2023-06-17 DIAGNOSIS — Z794 Long term (current) use of insulin: Secondary | ICD-10-CM | POA: Diagnosis not present

## 2023-07-01 DIAGNOSIS — S92514A Nondisplaced fracture of proximal phalanx of right lesser toe(s), initial encounter for closed fracture: Secondary | ICD-10-CM | POA: Diagnosis not present

## 2023-07-01 DIAGNOSIS — M869 Osteomyelitis, unspecified: Secondary | ICD-10-CM | POA: Diagnosis not present

## 2023-07-13 DIAGNOSIS — S92514D Nondisplaced fracture of proximal phalanx of right lesser toe(s), subsequent encounter for fracture with routine healing: Secondary | ICD-10-CM | POA: Diagnosis not present

## 2023-07-13 DIAGNOSIS — M79671 Pain in right foot: Secondary | ICD-10-CM | POA: Diagnosis not present

## 2023-07-13 DIAGNOSIS — M869 Osteomyelitis, unspecified: Secondary | ICD-10-CM | POA: Diagnosis not present

## 2023-07-22 ENCOUNTER — Ambulatory Visit: Payer: BC Managed Care – PPO | Attending: Family | Admitting: Family

## 2023-07-22 ENCOUNTER — Encounter: Payer: Self-pay | Admitting: Family

## 2023-07-22 VITALS — BP 136/92 | HR 87 | Resp 14 | Wt 308.1 lb

## 2023-07-22 DIAGNOSIS — Z794 Long term (current) use of insulin: Secondary | ICD-10-CM

## 2023-07-22 DIAGNOSIS — N189 Chronic kidney disease, unspecified: Secondary | ICD-10-CM | POA: Diagnosis not present

## 2023-07-22 DIAGNOSIS — Z87891 Personal history of nicotine dependence: Secondary | ICD-10-CM | POA: Insufficient documentation

## 2023-07-22 DIAGNOSIS — I130 Hypertensive heart and chronic kidney disease with heart failure and stage 1 through stage 4 chronic kidney disease, or unspecified chronic kidney disease: Secondary | ICD-10-CM | POA: Insufficient documentation

## 2023-07-22 DIAGNOSIS — Z79899 Other long term (current) drug therapy: Secondary | ICD-10-CM | POA: Insufficient documentation

## 2023-07-22 DIAGNOSIS — E119 Type 2 diabetes mellitus without complications: Secondary | ICD-10-CM

## 2023-07-22 DIAGNOSIS — E1122 Type 2 diabetes mellitus with diabetic chronic kidney disease: Secondary | ICD-10-CM | POA: Diagnosis not present

## 2023-07-22 DIAGNOSIS — I5022 Chronic systolic (congestive) heart failure: Secondary | ICD-10-CM | POA: Diagnosis not present

## 2023-07-22 DIAGNOSIS — Z91148 Patient's other noncompliance with medication regimen for other reason: Secondary | ICD-10-CM | POA: Insufficient documentation

## 2023-07-22 DIAGNOSIS — I10 Essential (primary) hypertension: Secondary | ICD-10-CM

## 2023-07-22 DIAGNOSIS — I2510 Atherosclerotic heart disease of native coronary artery without angina pectoris: Secondary | ICD-10-CM | POA: Diagnosis not present

## 2023-07-22 DIAGNOSIS — I428 Other cardiomyopathies: Secondary | ICD-10-CM | POA: Diagnosis not present

## 2023-07-22 LAB — BASIC METABOLIC PANEL
BUN/Creatinine Ratio: 12 (ref 9–20)
BUN: 17 mg/dL (ref 6–24)
CO2: 23 mmol/L (ref 20–29)
Calcium: 9.4 mg/dL (ref 8.7–10.2)
Chloride: 105 mmol/L (ref 96–106)
Creatinine, Ser: 1.4 mg/dL — ABNORMAL HIGH (ref 0.76–1.27)
Glucose: 213 mg/dL — ABNORMAL HIGH (ref 70–99)
Potassium: 4.5 mmol/L (ref 3.5–5.2)
Sodium: 141 mmol/L (ref 134–144)
eGFR: 61 mL/min/{1.73_m2} (ref >59)

## 2023-07-22 MED ORDER — SPIRONOLACTONE 25 MG PO TABS
12.5000 mg | ORAL_TABLET | Freq: Every day | ORAL | 3 refills | Status: DC
Start: 2023-07-22 — End: 2024-05-19

## 2023-07-22 MED ORDER — SACUBITRIL-VALSARTAN 49-51 MG PO TABS: 1.0000 | ORAL_TABLET | Freq: Two times a day (BID) | ORAL | 3 refills | Status: DC

## 2023-07-22 MED ORDER — METOPROLOL SUCCINATE ER 50 MG PO TB24
50.0000 mg | ORAL_TABLET | Freq: Every day | ORAL | 3 refills | Status: DC
Start: 2023-07-22 — End: 2024-05-19

## 2023-07-22 MED ORDER — ISOSORBIDE MONONITRATE ER 30 MG PO TB24: 30.0000 mg | ORAL_TABLET | Freq: Every day | ORAL | 3 refills | Status: DC

## 2023-07-22 MED ORDER — FUROSEMIDE 40 MG PO TABS
40.0000 mg | ORAL_TABLET | Freq: Every day | ORAL | 3 refills | Status: DC
Start: 2023-07-22 — End: 2024-08-23

## 2023-07-22 NOTE — Progress Notes (Signed)
PCP: Marcelino Duster, MD (last seen 07/24) Primary Cardiologist: Dorothyann Peng, MD (last seen 07/24)  HPI:  [name] is a 52 y/o male with a history of DM, HTN, anemia, osteomylitis, CKD, previous tobacco use and chronic heart failure.   Admitted 05/19/23 due to wound infection on rt great toe for several months but over the last month or so, he has noticed that the redness and swelling has began to expand into his ankle and up his calf w/ malodorous purulent discharge from his great toe despite antibiotics. Lower extremity Doppler >> negative for DVT. Right foot x-ray>>evidence of osteomyelitis. Started on Zosyn with podiatry consultation and  underwent amputation of the toe.   Echo 01/10/2019: EF of 15-20% along with trivial AR and mild pulmonic stenosis. Echo 06/03/20: EF 30% with mild LVH and mild PR Echo 06/04/21: EF 30% with mild LVH and mild valvular regurgitation  Echo 05/21/23: EF 45-50% with trivial MR  Catheterization done 01/13/2019 showed: Nonischemic cardiomyopathy, no significant CAD Mildly elevated wedge pressure,  EF severely depressed, global Hypokinesis, EF 20%  He presents today with a chief complaint of a follow-up visit. Currently has not symptoms and specifically denies fatigue, SOB, chest pain, cough, palpitations, abdominal distention, dizziness, pedal edema or difficulty sleeping. Says that he's gradually gained weight because he's eating "too much"  Has been out of his meds for ~ 3-4 weeks because he says that he doesn't have any refills and didn't think to call.   ROS: All systems negative except as listed in HPI, PMH and Problem List.  SH:  Social History   Socioeconomic History   Marital status: Single    Spouse name: Not on file   Number of children: Not on file   Years of education: Not on file   Highest education level: Not on file  Occupational History   Not on file  Tobacco Use   Smoking status: Former    Current packs/day: 0.00    Types:  Cigarettes    Quit date: 04/17/2017    Years since quitting: 6.2   Smokeless tobacco: Never  Vaping Use   Vaping status: Never Used  Substance and Sexual Activity   Alcohol use: No   Drug use: No   Sexual activity: Not Currently  Other Topics Concern   Not on file  Social History Narrative   No place to live at this time, independent otherwise   Social Determinants of Health   Financial Resource Strain: Low Risk  (09/20/2019)   Overall Financial Resource Strain (CARDIA)    Difficulty of Paying Living Expenses: Not hard at all  Food Insecurity: No Food Insecurity (05/20/2023)   Hunger Vital Sign    Worried About Running Out of Food in the Last Year: Never true    Ran Out of Food in the Last Year: Never true  Transportation Needs: No Transportation Needs (05/20/2023)   PRAPARE - Administrator, Civil Service (Medical): No    Lack of Transportation (Non-Medical): No  Physical Activity: Sufficiently Active (08/25/2019)   Exercise Vital Sign    Days of Exercise per Week: 5 days    Minutes of Exercise per Session: 150+ min  Stress: No Stress Concern Present (08/25/2019)   [employer] of Occupational Health - Occupational Stress Questionnaire    Feeling of Stress : Not at all  Social Connections: Unknown (09/03/2022)   Received from [employer] [facility]   Family and [employer] Support    Help with  Day-to-Day Activities: Not on file    Lonely or Isolated: Not on file  Intimate Partner Violence: Not At Risk (05/20/2023)   Humiliation, Afraid, Rape, and Kick questionnaire    Fear of Current or Ex-Partner: No    Emotionally Abused: No    Physically Abused: No    Sexually Abused: No    FH:  Family History  Family history unknown: Yes    Past Medical History:  Diagnosis Date   CHF (congestive heart failure) (HCC)    Diabetes mellitus without complication (HCC)    Hypertension     Current Outpatient Medications  Medication Sig  Dispense Refill   Cysteamine Bitartrate (PROCYSBI) 300 MG PACK Use 1 each 3 (three) times daily Use as instructed. ONE TOUCH DELICA LANCETS E11.9 (Patient not taking: Reported on 08/06/2022)     furosemide (LASIX) 40 MG tablet Take 1 tablet (40 mg total) by mouth daily. 30 tablet 0   insulin aspart protamine - aspart (NOVOLOG MIX 70/30 FLEXPEN) (70-30) 100 UNIT/ML FlexPen Inject 0.1 mLs (10 Units total) into the skin 2 (two) times daily. (Patient not taking: Reported on 05/19/2023) 15 mL 11   isosorbide mononitrate (IMDUR) 30 MG 24 hr tablet Take 1 tablet (30 mg total) by mouth daily. 90 tablet 3   meloxicam (MOBIC) 15 MG tablet Take 15 mg by mouth daily.     metoprolol succinate (TOPROL-XL) 50 MG 24 hr tablet Take 1 tablet (50 mg total) by mouth daily. Take with or immediately following a meal. 30 tablet 0   potassium chloride SA (KLOR-CON M) 20 MEQ tablet Take 1 tablet (20 mEq total) by mouth as needed. With lasix 15 tablet 0   sacubitril-valsartan (ENTRESTO) 49-51 MG Take 1 tablet by mouth 2 (two) times daily. (Patient not taking: Reported on 05/19/2023) 180 tablet 3   spironolactone (ALDACTONE) 25 MG tablet Take 0.5 tablets (12.5 mg total) by mouth daily. 15 tablet 0   No current facility-administered medications for this visit.   Vitals:   07/22/23 1053  BP: (!) 136/92  Pulse: 87  Resp: 14  SpO2: 98%  Weight: (!) 308 lb 2 oz (139.8 kg)   Wt Readings from Last 3 Encounters:  07/22/23 (!) 308 lb 2 oz (139.8 kg)  05/22/23 (!) 307 lb (139.3 kg)  11/04/22 295 lb (133.8 kg)   Lab Results  Component Value Date   CREATININE 1.28 (H) 05/22/2023   CREATININE 1.18 05/21/2023   CREATININE 1.27 (H) 05/20/2023    PHYSICAL EXAM:  General:  Well appearing. No resp difficulty HEENT: normal Neck: supple. JVP flat. No lymphadenopathy or thryomegaly appreciated. Cor: PMI normal. Regular rate & rhythm. No rubs, gallops or murmurs. Lungs: clear Abdomen: soft, nontender, nondistended. No  hepatosplenomegaly. No bruits or masses.  Extremities: no cyanosis, clubbing, rash, edema Neuro: alert & orientedx3, cranial nerves grossly intact. Moves all 4 extremities w/o difficulty. Affect pleasant.   ECG: not done   ASSESSMENT & PLAN:  1: NICM with mildly reduced ejection fraction- - suspect due to HTN/ DM as 12/2018 cath showed nonobstructive CAD - euvolemic today - weighing daily; reminded to call for an overnight weight gain of >2 pounds or a weekly weight gain of >5 pounds - weight up 13 pounds since last visit here 8 months ago - Echo 01/10/2019: EF of 15-20% along with trivial AR and mild pulmonic stenosis. - Echo 06/03/20: EF 30% with mild LVH and mild PR - Echo 06/04/21: EF 30% with mild LVH and mild valvular regurgitation  -  Echo 05/21/23: EF 45-50% with trivial MR - not adding salt and is trying to limit his sodium intake although does eat out - saw cardiology Juliann Pares) 07/24 - has been out of meds for several weeks but he ran out of refills; emphasized calling here if that happens so that he isn't without his medications - refilled furosemide 40mg  daily - refilled isosorbide 30mg  daily - refilled metoprolol succinate 50mg  daily - refilled entresto 49/51mg  BID - refilled spironolactone 12.5mg  daily - BMP today to decide about potassium supplements - BNP 07/10/2019 was 968.0  2: HTN- - BP 136/92 but he's been out meds for several weeks; refilled today - saw PCP Letitia Libra) 07/24 - BMP 05/22/23 showed sodium 135, potassium 4.1, creatinine 1.28 and GFR >60  3: DM- - A1c 05/19/23 was 7.9%  Return in 1 month, sooner if needed.

## 2023-07-22 NOTE — Patient Instructions (Signed)
Go to [facility] and pick up lasix, isosorbide, metoprolol, entresto and spironolactone.

## 2023-08-05 DIAGNOSIS — M869 Osteomyelitis, unspecified: Secondary | ICD-10-CM | POA: Diagnosis not present

## 2023-08-05 DIAGNOSIS — Z89412 Acquired absence of left great toe: Secondary | ICD-10-CM | POA: Diagnosis not present

## 2023-08-05 DIAGNOSIS — S92514D Nondisplaced fracture of proximal phalanx of right lesser toe(s), subsequent encounter for fracture with routine healing: Secondary | ICD-10-CM | POA: Diagnosis not present

## 2023-08-19 ENCOUNTER — Encounter: Payer: BC Managed Care – PPO | Admitting: Family

## 2023-08-19 ENCOUNTER — Telehealth: Payer: Self-pay | Admitting: Family

## 2023-08-19 NOTE — Progress Notes (Deleted)
PCP: Marcelino Duster, MD (last seen 07/24) Primary Cardiologist: Dorothyann Peng, MD (last seen 07/24)  HPI:  [name] is a 52 y/o male with a history of DM, HTN, anemia, osteomylitis, CKD, previous tobacco use and chronic heart failure.   Admitted 05/19/23 due to wound infection on rt great toe for several months but over the last month or so, he has noticed that the redness and swelling has began to expand into his ankle and up his calf w/ malodorous purulent discharge from his great toe despite antibiotics. Lower extremity Doppler >> negative for DVT. Right foot x-ray>>evidence of osteomyelitis. Started on Zosyn with podiatry consultation and  underwent amputation of the toe.   Echo 01/10/2019: EF of 15-20% along with trivial AR and mild pulmonic stenosis. Echo 06/03/20: EF 30% with mild LVH and mild PR Echo 06/04/21: EF 30% with mild LVH and mild valvular regurgitation  Echo 05/21/23: EF 45-50% with trivial MR  Catheterization done 01/13/2019 showed: Nonischemic cardiomyopathy, no significant CAD Mildly elevated wedge pressure,  EF severely depressed, global Hypokinesis, EF 20%  He presents today with a chief complaint of a follow-up visit.    ROS: All systems negative except as listed in HPI, PMH and Problem List.  SH:  Social History   Socioeconomic History   Marital status: Single    Spouse name: Not on file   Number of children: Not on file   Years of education: Not on file   Highest education level: Not on file  Occupational History   Not on file  Tobacco Use   Smoking status: Former    Current packs/day: 0.00    Types: Cigarettes    Quit date: 04/17/2017    Years since quitting: 6.3   Smokeless tobacco: Never  Vaping Use   Vaping status: Never Used  Substance and Sexual Activity   Alcohol use: No   Drug use: No   Sexual activity: Not Currently  Other Topics Concern   Not on file  Social History Narrative   No place to live at this time, independent otherwise    Social Determinants of Health   Financial Resource Strain: Low Risk  (09/20/2019)   Overall Financial Resource Strain (CARDIA)    Difficulty of Paying Living Expenses: Not hard at all  Food Insecurity: No Food Insecurity (05/20/2023)   Hunger Vital Sign    Worried About Running Out of Food in the Last Year: Never true    Ran Out of Food in the Last Year: Never true  Transportation Needs: No Transportation Needs (05/20/2023)   PRAPARE - Administrator, Civil Service (Medical): No    Lack of Transportation (Non-Medical): No  Physical Activity: Sufficiently Active (08/25/2019)   Exercise Vital Sign    Days of Exercise per Week: 5 days    Minutes of Exercise per Session: 150+ min  Stress: No Stress Concern Present (08/25/2019)   [employer] of Occupational Health - Occupational Stress Questionnaire    Feeling of Stress : Not at all  Social Connections: Unknown (09/03/2022)   Received from [employer] [facility]   Family and [employer] Support    Help with Day-to-Day Activities: Not on file    Lonely or Isolated: Not on file  Intimate Partner Violence: Not At Risk (05/20/2023)   Humiliation, Afraid, Rape, and Kick questionnaire    Fear of Current or Ex-Partner: No    Emotionally Abused: No    Physically Abused: No    Sexually Abused:  No    FH:  Family History  Family history unknown: Yes    Past Medical History:  Diagnosis Date   CHF (congestive heart failure) (HCC)    Diabetes mellitus without complication (HCC)    Hypertension     Current Outpatient Medications  Medication Sig Dispense Refill   Cysteamine Bitartrate (PROCYSBI) 300 MG PACK Use 1 each 3 (three) times daily Use as instructed. ONE TOUCH DELICA LANCETS E11.9 (Patient not taking: Reported on 08/06/2022)     furosemide (LASIX) 40 MG tablet Take 1 tablet (40 mg total) by mouth daily. 90 tablet 3   insulin aspart protamine - aspart (NOVOLOG MIX 70/30 FLEXPEN) (70-30)  100 UNIT/ML FlexPen Inject 0.1 mLs (10 Units total) into the skin 2 (two) times daily. 15 mL 11   isosorbide mononitrate (IMDUR) 30 MG 24 hr tablet Take 1 tablet (30 mg total) by mouth daily. 90 tablet 3   meloxicam (MOBIC) 15 MG tablet Take 15 mg by mouth daily. (Patient not taking: Reported on 07/22/2023)     metoprolol succinate (TOPROL-XL) 50 MG 24 hr tablet Take 1 tablet (50 mg total) by mouth daily. Take with or immediately following a meal. 90 tablet 3   sacubitril-valsartan (ENTRESTO) 49-51 MG Take 1 tablet by mouth 2 (two) times daily. 180 tablet 3   spironolactone (ALDACTONE) 25 MG tablet Take 0.5 tablets (12.5 mg total) by mouth daily. 45 tablet 3   No current facility-administered medications for this visit.      PHYSICAL EXAM:  General:  Well appearing. No resp difficulty HEENT: normal Neck: supple. JVP flat. No lymphadenopathy or thryomegaly appreciated. Cor: PMI normal. Regular rate & rhythm. No rubs, gallops or murmurs. Lungs: clear Abdomen: soft, nontender, nondistended. No hepatosplenomegaly. No bruits or masses.  Extremities: no cyanosis, clubbing, rash, edema Neuro: alert & orientedx3, cranial nerves grossly intact. Moves all 4 extremities w/o difficulty. Affect pleasant.   ECG: not done   ASSESSMENT & PLAN:  1: NICM with mildly reduced ejection fraction- - suspect due to HTN/ DM as 12/2018 cath showed nonobstructive CAD - euvolemic today - weighing daily; reminded to call for an overnight weight gain of >2 pounds or a weekly weight gain of >5 pounds - weight 308.2 pounds since last visit here 1 month ago - Echo 01/10/2019: EF of 15-20% along with trivial AR and mild pulmonic stenosis. - Echo 06/03/20: EF 30% with mild LVH and mild PR - Echo 06/04/21: EF 30% with mild LVH and mild valvular regurgitation  - Echo 05/21/23: EF 45-50% with trivial MR - not adding salt and is trying to limit his sodium intake although does eat out - saw cardiology Juliann Pares) 07/24 -  continue furosemide 40mg  daily - continue isosorbide 30mg  daily - continue metoprolol succinate 50mg  daily - continue entresto 49/51mg  BID - continue spironolactone 12.5mg  daily - BNP 07/10/2019 was 968.0  2: HTN- - BP  - saw PCP Letitia Libra) 07/24 - BMP 07/22/23 showed sodium 141, potassium 4.5, creatinine 1.4 and GFR 61  3: DM- - A1c 05/19/23 was 7.9%

## 2023-08-19 NOTE — Telephone Encounter (Signed)
Patient did not show for his [facility] appointment on 08/19/23.

## 2023-09-14 DIAGNOSIS — M25512 Pain in left shoulder: Secondary | ICD-10-CM | POA: Diagnosis not present

## 2023-11-03 DIAGNOSIS — K5900 Constipation, unspecified: Secondary | ICD-10-CM | POA: Diagnosis not present

## 2023-11-03 DIAGNOSIS — Z1211 Encounter for screening for malignant neoplasm of colon: Secondary | ICD-10-CM | POA: Diagnosis not present

## 2023-11-09 DIAGNOSIS — L97512 Non-pressure chronic ulcer of other part of right foot with fat layer exposed: Secondary | ICD-10-CM | POA: Diagnosis not present

## 2023-11-09 DIAGNOSIS — B351 Tinea unguium: Secondary | ICD-10-CM | POA: Diagnosis not present

## 2023-11-09 DIAGNOSIS — S92514D Nondisplaced fracture of proximal phalanx of right lesser toe(s), subsequent encounter for fracture with routine healing: Secondary | ICD-10-CM | POA: Diagnosis not present

## 2023-11-09 DIAGNOSIS — E1142 Type 2 diabetes mellitus with diabetic polyneuropathy: Secondary | ICD-10-CM | POA: Diagnosis not present

## 2023-11-09 DIAGNOSIS — Z89412 Acquired absence of left great toe: Secondary | ICD-10-CM | POA: Diagnosis not present

## 2023-12-06 ENCOUNTER — Ambulatory Visit (HOSPITAL_COMMUNITY)
Admission: RE | Admit: 2023-12-06 | Discharge: 2023-12-06 | Disposition: A | Discharge status: Home / Self Care | Payer: BC Managed Care – PPO | Source: Ambulatory Visit | Attending: Internal Medicine | Admitting: Internal Medicine

## 2023-12-06 ENCOUNTER — Encounter (HOSPITAL_COMMUNITY): Payer: Self-pay

## 2023-12-06 VITALS — BP 125/82 | HR 92 | Temp 98.9°F | Resp 18

## 2023-12-06 DIAGNOSIS — J069 Acute upper respiratory infection, unspecified: Secondary | ICD-10-CM | POA: Diagnosis not present

## 2023-12-06 MED ORDER — PREDNISONE 10 MG PO TABS: 40.0000 mg | ORAL_TABLET | Freq: Every day | ORAL | 0 refills | Status: AC

## 2023-12-06 MED ORDER — PROMETHAZINE-DM 6.25-15 MG/5ML PO SYRP: 5.0000 mL | ORAL_SOLUTION | Freq: Three times a day (TID) | ORAL | 0 refills | Status: DC | PRN

## 2023-12-06 MED ORDER — ALBUTEROL SULFATE HFA 108 (90 BASE) MCG/ACT IN AERS: 1.0000 | INHALATION_SPRAY | Freq: Four times a day (QID) | RESPIRATORY_TRACT | 0 refills | Status: DC | PRN

## 2023-12-06 NOTE — ED Triage Notes (Signed)
 Pt c/o productive cough with white sputum, nasal congestion, headaches, and body aches x5 days. 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

## 2023-12-06 NOTE — ED Provider Notes (Signed)
 [facility]    CSN: [id number] Arrival date & time: 12/06/23  1419      History   Chief Complaint Chief Complaint  Patient presents with   Cough    HPI Gary Bentley is a 53 y.o. male.   53 year old male who presents to urgent care with complaints of cough, congestion, body aches, headache since Thursday.  He reports he is not been out and has not been exposed to anyone sick.  He has felt hot but has not taken his temperature.  He denies any shortness of breath, chills, nausea, vomiting, abdominal pain, dysuria.  He does relate a poor appetite but is staying hydrated.  He has been using Alka-Seltzer cold with minimal relief.     Cough Associated symptoms: fever and headaches   Associated symptoms: no chest pain, no chills, no ear pain, no rash, no shortness of breath and no sore throat     Past Medical History:  Diagnosis Date   CHF (congestive heart failure) (HCC)    Diabetes mellitus without complication (HCC)    Hypertension     Patient Active Problem List   Diagnosis Date Noted   Acute osteomyelitis (HCC) 05/19/2023   CKD (chronic kidney disease) stage 2, GFR 60-89 ml/min 05/19/2023   Essential hypertension 05/19/2023   Normocytic anemia 05/19/2023   HFrEF (heart failure with reduced ejection fraction) (HCC) 01/02/2022   Nonischemic cardiomyopathy (HCC) 01/02/2022   Obesity, Class I, BMI 30-34.9 01/02/2022   Tobacco use 01/02/2022   Anxiety, generalized 05/29/2021   Erectile dysfunction 11/06/2020   Penile implant failure, 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   Hypoglycemia 05/28/2019   Diabetes mellitus due to underlying condition with foot ulcer (CODE) (HCC) 09/04/2017   Hyperglycemia 09/04/2017    Past Surgical History:  Procedure Laterality Date   AMPUTATION TOE Left 01/05/2020   Procedure: AMPUTATION TOE MPJ LEFT;  Surgeon: Ashley Soulier, DPM;  Location: [facility];  Service: Podiatry;  Laterality: Left;   AMPUTATION TOE Right 05/20/2023    Procedure: AMPUTATION TOE, RIGHT GREAT TOE;  Surgeon: Ashley Soulier, DPM;  Location: [facility];  Service: Podiatry;  Laterality: Right;   FOOT SURGERY     RIGHT/LEFT HEART CATH AND CORONARY ANGIOGRAPHY N/A 01/13/2019   Procedure: RIGHT/LEFT HEART CATH AND CORONARY ANGIOGRAPHY;  Surgeon: Perla Evalene PARAS, MD;  Location: [facility];  Service: Cardiovascular;  Laterality: N/A;       Home Medications    Prior to Admission medications   Medication Sig Start Date End Date Taking? Authorizing Provider  Cysteamine Bitartrate (PROCYSBI) 300 MG PACK Use 1 each 3 (three) times daily Use as instructed. ONE TOUCH DELICA LANCETS E11.9 Patient not taking: Reported on 08/06/2022 09/11/20   [provider]  furosemide  (LASIX ) 40 MG tablet Take 1 tablet (40 mg total) by mouth daily. 07/22/23 08/21/23  Donette Ellouise LABOR, FNP  insulin  aspart protamine - aspart (NOVOLOG  MIX 70/30 FLEXPEN) (70-30) 100 UNIT/ML FlexPen Inject 0.1 mLs (10 Units total) into the skin 2 (two) times daily. 01/15/19   Tobie Press, MD  isosorbide  mononitrate (IMDUR ) 30 MG 24 hr tablet Take 1 tablet (30 mg total) by mouth daily. 07/22/23   Donette Ellouise LABOR, FNP  meloxicam (MOBIC) 15 MG tablet Take 15 mg by mouth daily. Patient not taking: Reported on 07/22/2023 03/25/23   [provider]  metoprolol  succinate (TOPROL -XL) 50 MG 24 hr tablet Take 1 tablet (50 mg total) by mouth daily. Take with or immediately following a meal. 07/22/23 08/21/23  Donette [place] A, FNP  sacubitril -valsartan  (ENTRESTO ) 49-51 MG Take 1 tablet by mouth 2 (two) times daily. 07/22/23   Donette [place] LABOR, FNP  spironolactone  (ALDACTONE ) 25 MG tablet Take 0.5 tablets (12.5 mg total) by mouth daily. 07/22/23 08/21/23  Donette [place] LABOR, FNP    Family History Family History  Family history unknown: Yes    Social History Social History   Tobacco Use   Smoking status: Former    Current packs/day: 0.00    Types: Cigarettes    Quit date: 04/17/2017     Years since quitting: 6.6   Smokeless tobacco: Never  Vaping Use   Vaping status: Never Used  Substance Use Topics   Alcohol use: No   Drug use: No     Allergies   Bee venom   Review of Systems Review of Systems  Constitutional:  Positive for fever. Negative for chills.  HENT:  Positive for congestion. Negative for ear pain and sore throat.   Eyes:  Negative for pain and visual disturbance.  Respiratory:  Positive for cough. Negative for shortness of breath.   Cardiovascular:  Negative for chest pain and palpitations.  Gastrointestinal:  Negative for abdominal pain and vomiting.  Genitourinary:  Negative for dysuria and hematuria.  Musculoskeletal:  Positive for arthralgias. Negative for back pain.  Skin:  Negative for color change and rash.  Neurological:  Positive for headaches. Negative for seizures and syncope.  All other systems reviewed and are negative.    Physical Exam Triage Vital Signs ED Triage Vitals  Encounter Vitals Group     BP 12/06/23 1450 125/82     Systolic BP Percentile --      Diastolic BP Percentile --      Pulse Rate 12/06/23 1450 92     Resp 12/06/23 1450 18     Temp 12/06/23 1450 98.9 F (37.2 C)     Temp Source 12/06/23 1450 Oral     SpO2 12/06/23 1450 90 %     Weight --      Height --      Head Circumference --      Peak Flow --      Pain Score 12/06/23 1451 6     Pain Loc --      Pain Education --      Exclude from Growth Chart --    No data found.  Updated Vital Signs BP 125/82 (BP Location: Left Arm)   Pulse 92   Temp 98.9 F (37.2 C) (Oral)   Resp 18   SpO2 90%   Visual Acuity Right Eye Distance:   Left Eye Distance:   Bilateral Distance:    Right Eye Near:   Left Eye Near:    Bilateral Near:     Physical Exam Vitals and nursing note reviewed.  Constitutional:      General: He is not in acute distress.    Appearance: He is well-developed.  HENT:     Head: Normocephalic and atraumatic.     Right Ear: Tympanic  membrane normal.     Left Ear: Tympanic membrane normal.     Nose: Congestion present.     Mouth/Throat:     Mouth: Mucous membranes are moist.  Eyes:     Conjunctiva/sclera: Conjunctivae normal.  Cardiovascular:     Rate and Rhythm: Normal rate and regular rhythm.     Heart sounds: No murmur heard. Pulmonary:     Effort: Pulmonary effort is normal. No respiratory distress.  Breath sounds: Normal breath sounds.  Abdominal:     Palpations: Abdomen is soft.     Tenderness: There is no abdominal tenderness.  Musculoskeletal:        General: No swelling.     Cervical back: Neck supple.  Skin:    General: Skin is warm and dry.     Capillary Refill: Capillary refill takes less than 2 seconds.  Neurological:     General: No focal deficit present.     Mental Status: He is alert.  Psychiatric:        Mood and Affect: Mood normal.      UC Treatments / Results  Labs (all labs ordered are listed, but only abnormal results are displayed) Labs Reviewed - No data to display  EKG   Radiology No results found.  Procedures Procedures (including critical care time)  Medications Ordered in UC Medications - No data to display  Initial Impression / Assessment and Plan / UC Course  I have reviewed the triage vital signs and the nursing notes.  Pertinent labs & imaging results that were available during my care of the patient were reviewed by me and considered in my medical decision making (see chart for details).     Viral URI with cough   Symptoms most consistent with a viral upper respiratory infection. This does no require antibiotic treatment. We will treat the symptoms with the following:  Promethazine  DM 5 mL every 8 hours as needed for cough.  Use caution as this medication can cause drowsiness. Albuterol  inhaler 1-2 puffs every 6 hours as needed for wheezing/shortness of breath. Prednisone  40 mg daily for 5 days. Take this in the morning. This is a steroid to help with  inflammation  Rest and stay hydrated Return to urgent care or PCP if symptoms worsen or fail to resolve.    Final Clinical Impressions(s) / UC Diagnoses   Final diagnoses:  None   Discharge Instructions   None    ED Prescriptions   None    PDMP not reviewed this encounter.   Teresa Almarie LABOR, PA-C 12/06/23 1542

## 2023-12-06 NOTE — Discharge Instructions (Addendum)
 Symptoms most consistent with a viral upper respiratory infection. This does no require antibiotic treatment. We will treat the symptoms with the following:  Promethazine  DM 5 mL every 8 hours as needed for cough.  Use caution as this medication can cause drowsiness. Albuterol  inhaler 1-2 puffs every 6 hours as needed for wheezing/shortness of breath. Prednisone  40 mg daily for 5 days. Take this in the morning. This is a steroid to help with inflammation  Rest and stay hydrated Return to urgent care or PCP if symptoms worsen or fail to resolve.

## 2023-12-14 DIAGNOSIS — E1142 Type 2 diabetes mellitus with diabetic polyneuropathy: Secondary | ICD-10-CM | POA: Diagnosis not present

## 2023-12-14 DIAGNOSIS — M2041 Other hammer toe(s) (acquired), right foot: Secondary | ICD-10-CM | POA: Diagnosis not present

## 2023-12-14 DIAGNOSIS — L97512 Non-pressure chronic ulcer of other part of right foot with fat layer exposed: Secondary | ICD-10-CM | POA: Diagnosis not present

## 2023-12-28 ENCOUNTER — Encounter: Payer: Self-pay | Admitting: Internal Medicine

## 2023-12-30 DIAGNOSIS — M2041 Other hammer toe(s) (acquired), right foot: Secondary | ICD-10-CM | POA: Diagnosis not present

## 2023-12-30 DIAGNOSIS — E1142 Type 2 diabetes mellitus with diabetic polyneuropathy: Secondary | ICD-10-CM | POA: Diagnosis not present

## 2023-12-30 DIAGNOSIS — L97512 Non-pressure chronic ulcer of other part of right foot with fat layer exposed: Secondary | ICD-10-CM | POA: Diagnosis not present

## 2024-01-04 ENCOUNTER — Encounter: Payer: Self-pay | Admitting: Internal Medicine

## 2024-01-05 ENCOUNTER — Ambulatory Visit: Payer: BC Managed Care – PPO | Admitting: Anesthesiology

## 2024-01-05 ENCOUNTER — Encounter
Admission: RE | Disposition: A | Discharge status: Home / Self Care | Payer: Self-pay | Source: Home / Self Care | Attending: Internal Medicine

## 2024-01-05 ENCOUNTER — Ambulatory Visit
Admission: RE | Admit: 2024-01-05 | Discharge: 2024-01-05 | Disposition: A | Discharge status: Home / Self Care | Payer: BC Managed Care – PPO | Attending: Internal Medicine | Admitting: Internal Medicine

## 2024-01-05 DIAGNOSIS — F419 Anxiety disorder, unspecified: Secondary | ICD-10-CM | POA: Diagnosis not present

## 2024-01-05 DIAGNOSIS — Z791 Long term (current) use of non-steroidal anti-inflammatories (NSAID): Secondary | ICD-10-CM | POA: Diagnosis not present

## 2024-01-05 DIAGNOSIS — K640 First degree hemorrhoids: Secondary | ICD-10-CM | POA: Diagnosis not present

## 2024-01-05 DIAGNOSIS — Z1211 Encounter for screening for malignant neoplasm of colon: Secondary | ICD-10-CM | POA: Diagnosis not present

## 2024-01-05 DIAGNOSIS — E669 Obesity, unspecified: Secondary | ICD-10-CM | POA: Diagnosis not present

## 2024-01-05 DIAGNOSIS — Z79899 Other long term (current) drug therapy: Secondary | ICD-10-CM | POA: Insufficient documentation

## 2024-01-05 DIAGNOSIS — I110 Hypertensive heart disease with heart failure: Secondary | ICD-10-CM | POA: Insufficient documentation

## 2024-01-05 DIAGNOSIS — Z87891 Personal history of nicotine dependence: Secondary | ICD-10-CM | POA: Insufficient documentation

## 2024-01-05 DIAGNOSIS — I509 Heart failure, unspecified: Secondary | ICD-10-CM | POA: Insufficient documentation

## 2024-01-05 DIAGNOSIS — Z6834 Body mass index (BMI) 34.0-34.9, adult: Secondary | ICD-10-CM | POA: Insufficient documentation

## 2024-01-05 DIAGNOSIS — K5730 Diverticulosis of large intestine without perforation or abscess without bleeding: Secondary | ICD-10-CM | POA: Diagnosis not present

## 2024-01-05 DIAGNOSIS — K649 Unspecified hemorrhoids: Secondary | ICD-10-CM | POA: Diagnosis not present

## 2024-01-05 DIAGNOSIS — E119 Type 2 diabetes mellitus without complications: Secondary | ICD-10-CM | POA: Diagnosis not present

## 2024-01-05 DIAGNOSIS — Z794 Long term (current) use of insulin: Secondary | ICD-10-CM | POA: Insufficient documentation

## 2024-01-05 HISTORY — PX: COLONOSCOPY WITH PROPOFOL: SHX5780

## 2024-01-05 LAB — GLUCOSE, CAPILLARY: Glucose-Capillary: 160 mg/dL — ABNORMAL HIGH (ref 70–99)

## 2024-01-05 SURGERY — COLONOSCOPY WITH PROPOFOL
Anesthesia: General

## 2024-01-05 MED ORDER — PROPOFOL 10 MG/ML IV BOLUS
INTRAVENOUS | Status: DC | PRN
  Administered 2024-01-05: 40 mg via INTRAVENOUS
  Administered 2024-01-05: 10 mg via INTRAVENOUS
  Administered 2024-01-05: 50 mg via INTRAVENOUS

## 2024-01-05 MED ORDER — PROPOFOL 500 MG/50ML IV EMUL
INTRAVENOUS | Status: DC | PRN
  Administered 2024-01-05: 100 ug/kg/min via INTRAVENOUS

## 2024-01-05 MED ORDER — DEXMEDETOMIDINE HCL IN NACL 80 MCG/20ML IV SOLN
INTRAVENOUS | Status: AC
  Filled 2024-01-05: qty 20

## 2024-01-05 MED ORDER — DEXMEDETOMIDINE HCL IN NACL 80 MCG/20ML IV SOLN
INTRAVENOUS | Status: DC | PRN
  Administered 2024-01-05: 8 ug via INTRAVENOUS

## 2024-01-05 MED ORDER — LIDOCAINE HCL (CARDIAC) PF 100 MG/5ML IV SOSY
PREFILLED_SYRINGE | INTRAVENOUS | Status: DC | PRN
  Administered 2024-01-05: 50 mg via INTRAVENOUS

## 2024-01-05 MED ORDER — SODIUM CHLORIDE 0.9 % IV SOLN
INTRAVENOUS | Status: DC
  Administered 2024-01-05: 20 mL/h via INTRAVENOUS

## 2024-01-05 MED ORDER — LIDOCAINE HCL (PF) 2 % IJ SOLN
INTRAMUSCULAR | Status: AC
  Filled 2024-01-05: qty 5

## 2024-01-05 NOTE — Interval H&P Note (Signed)
History and Physical Interval Note:  01/05/2024 10:58 AM  Gary Bentley  has presented today for surgery, with the diagnosis of colon cancer screeening.  The various methods of treatment have been discussed with the patient and family. After consideration of risks, benefits and other options for treatment, the patient has consented to  Procedure(s): COLONOSCOPY WITH PROPOFOL (N/A) as a surgical intervention.  The patient's history has been reviewed, patient examined, no change in status, stable for surgery.  I have reviewed the patient's chart and labs.  Questions were answered to the patient's satisfaction.     [place], Elsie

## 2024-01-05 NOTE — H&P (Signed)
Outpatient short stay form Pre-procedure 01/05/2024 10:57 AM Maribeth Jiles K. Norma Fredrickson, M.D.  Primary Physician: Marcelino Duster, M.D.  Reason for visit:  Colon cancer screening  History of present illness:  Patient presents for colonoscopy for colon cancer screening. The patient denies complaints of abdominal pain, significant change in bowel habits, or rectal bleeding.      Current Facility-Administered Medications:    0.9 %  sodium chloride infusion, , Intravenous, Continuous, Naiyana Barbian, Boykin Nearing, MD, Last Rate: 20 mL/hr at 01/05/24 1022, 20 mL/hr at 01/05/24 1022  Medications Prior to Admission  Medication Sig Dispense Refill Last Dose/Taking   albuterol (VENTOLIN HFA) 108 (90 Base) MCG/ACT inhaler Inhale 1-2 puffs into the lungs every 6 (six) hours as needed for wheezing or shortness of breath. 6.7 g 0 01/04/2024   insulin aspart protamine - aspart (NOVOLOG MIX 70/30 FLEXPEN) (70-30) 100 UNIT/ML FlexPen Inject 0.1 mLs (10 Units total) into the skin 2 (two) times daily. 15 mL 11 01/04/2024   isosorbide mononitrate (IMDUR) 30 MG 24 hr tablet Take 1 tablet (30 mg total) by mouth daily. 90 tablet 3 01/04/2024   promethazine-dextromethorphan (PROMETHAZINE-DM) 6.25-15 MG/5ML syrup Take 5 mLs by mouth every 8 (eight) hours as needed for cough. 180 mL 0 01/04/2024   sacubitril-valsartan (ENTRESTO) 49-51 MG Take 1 tablet by mouth 2 (two) times daily. 180 tablet 3 01/04/2024   Cysteamine Bitartrate (PROCYSBI) 300 MG PACK Use 1 each 3 (three) times daily Use as instructed. ONE TOUCH DELICA LANCETS E11.9 (Patient not taking: Reported on 08/06/2022)      furosemide (LASIX) 40 MG tablet Take 1 tablet (40 mg total) by mouth daily. 90 tablet 3    meloxicam (MOBIC) 15 MG tablet Take 15 mg by mouth daily. (Patient not taking: Reported on 07/22/2023)      metoprolol succinate (TOPROL-XL) 50 MG 24 hr tablet Take 1 tablet (50 mg total) by mouth daily. Take with or immediately following a meal. 90 tablet 3    spironolactone  (ALDACTONE) 25 MG tablet Take 0.5 tablets (12.5 mg total) by mouth daily. 45 tablet 3      Allergies  Allergen Reactions   Bee Venom Anaphylaxis     Past Medical History:  Diagnosis Date   CHF (congestive heart failure) (HCC)    Diabetes mellitus without complication (HCC)    Hypertension     Review of systems:  Otherwise negative.    Physical Exam  Gen: Alert, oriented. Appears stated age.  HEENT: [place]/AT. PERRLA. Lungs: CTA, no wheezes. CV: RR nl S1, S2. Abd: soft, benign, no masses. BS+ Ext: No edema. Pulses 2+    Planned procedures: Proceed with colonoscopy. The patient understands the nature of the planned procedure, indications, risks, alternatives and potential complications including but not limited to bleeding, infection, perforation, damage to internal organs and possible oversedation/side effects from anesthesia. The patient agrees and gives consent to proceed.  Please refer to procedure notes for findings, recommendations and patient disposition/instructions.     Jamarrius Salay K. Norma Fredrickson, M.D. Gastroenterology 01/05/2024  10:57 AM

## 2024-01-05 NOTE — Op Note (Signed)
[facility] Gastroenterology Patient Name: Gary Bentley Procedure Date: 01/05/2024 11:01 AM MRN: [id number] Account #: [id number] Date of Birth: 1970/11/28 Admit Type: Outpatient Age: 53 Room: [facility] ENDO ROOM 2 Gender: Male Note Status: Finalized Instrument Name: Prentice Docker [id number] Procedure:             Colonoscopy Indications:           Screening for colorectal malignant neoplasm Providers:             Royce Macadamia K. Jaylene Schrom MD, MD Medicines:             Propofol per Anesthesia Complications:         No immediate complications. Estimated blood loss: None. Procedure:             Pre-Anesthesia Assessment:                        - The risks and benefits of the procedure and the                         sedation options and risks were discussed with the                         patient. All questions were answered and informed                         consent was obtained.                        - Patient identification and proposed procedure were                         verified prior to the procedure by the nurse. The                         procedure was verified in the procedure room.                        - ASA Grade Assessment: III - A patient with severe                         systemic disease.                        - After reviewing the risks and benefits, the patient                         was deemed in satisfactory condition to undergo the                         procedure.                        After obtaining informed consent, the colonoscope was                         passed under direct vision. Throughout the procedure,                         the patient's blood pressure, pulse, and oxygen  saturations were monitored continuously. The                         Colonoscope was introduced through the anus and                         advanced to the the cecum, identified by appendiceal                         orifice and ileocecal  valve. The colonoscopy was                         performed without difficulty. The patient tolerated                         the procedure well. The quality of the bowel                         preparation was good except the ascending colon was                         fair. The ileocecal valve and Crow's foot were                         photographed. Findings:      The perianal and digital rectal examinations were normal. Pertinent       negatives include normal sphincter tone and no palpable rectal lesions.      Non-bleeding internal hemorrhoids were found during retroflexion. The       hemorrhoids were Grade I (internal hemorrhoids that do not prolapse).      Copious quantities of solid stool was found in the proximal transverse       colon, in the ascending colon and in the cecum, interfering with       visualization. Lavage of the area was performed using a moderate amount       of tap water, resulting in incomplete clearance with fair visualization.      Many small-mouthed diverticula were found in the entire colon. There was       no evidence of diverticular bleeding.      The exam was otherwise without abnormality. Impression:            - Non-bleeding internal hemorrhoids.                        - Stool in the proximal transverse colon, in the                         ascending colon and in the cecum.                        - Mild diverticulosis in the entire examined colon.                         There was no evidence of diverticular bleeding.                        - The examination was otherwise normal.                        -  No specimens collected. Recommendation:        - Patient has a contact number available for                         emergencies. The signs and symptoms of potential                         delayed complications were discussed with the patient.                         Return to normal activities tomorrow. Written                         discharge  instructions were provided to the patient.                        - Resume previous diet.                        - Continue present medications.                        - Repeat colonoscopy in 1 year because the bowel                         preparation was suboptimal.                        - Plan Miralax 17g daily (one capful) daily for 2                         weeks prior to next colonoscopy, 2 days of clear                         liquids before next colonoscopy. Plan to use higher                         volume prep (Trilyte) before next scheduled                         colonoscopy.                        - Return to GI office PRN.                        - The findings and recommendations were discussed with                         the patient. Procedure Code(s):     --- Professional ---                        Z6109, Colorectal cancer screening; colonoscopy on                         individual not meeting criteria for high risk Diagnosis Code(s):     --- Professional ---                        K57.30, Diverticulosis of large  intestine without                         perforation or abscess without bleeding                        K64.0, First degree hemorrhoids                        Z12.11, Encounter for screening for malignant neoplasm                         of colon CPT copyright 2022 American Medical Association. All rights reserved. The codes documented in this report are preliminary and upon coder review may  be revised to meet current compliance requirements. Stanton Kidney MD, MD 01/05/2024 11:37:12 AM This report has been signed electronically. Number of Addenda: 0 Note Initiated On: 01/05/2024 11:01 AM Scope Withdrawal Time: 0 hours 5 minutes 58 seconds  Total Procedure Duration: 0 hours 12 minutes 46 seconds  Estimated Blood Loss:  Estimated blood loss: none.      [facility]

## 2024-01-05 NOTE — Anesthesia Preprocedure Evaluation (Signed)
Anesthesia Evaluation  Patient identified by MRN, date of birth, ID band Patient awake    Reviewed: Allergy & Precautions, NPO status , Patient's Chart, lab work & pertinent test results  History of Anesthesia Complications Negative for: history of anesthetic complications  Airway Mallampati: II  TM Distance: >3 FB Neck ROM: Full    Dental no notable dental hx. (+) Teeth Intact   Pulmonary neg pulmonary ROS, neg sleep apnea, neg COPD, Patient abstained from smoking.Not current smoker, former smoker   Pulmonary exam normal breath sounds clear to auscultation       Cardiovascular Exercise Tolerance: Good METShypertension, Pt. on medications +CHF  (-) CAD and (-) Past MI (-) dysrhythmias  Rhythm:Regular Rate:Normal - Systolic murmurs 1. Left ventricular ejection fraction, by estimation, is 45 to 50%. The  left ventricle has mildly decreased function. The left ventricle has no  regional wall motion abnormalities. Left ventricular diastolic parameters  were normal.   2. Right ventricular systolic function is normal. The right ventricular  size is normal.   3. The mitral valve is normal in structure. Trivial mitral valve  regurgitation. No evidence of mitral stenosis.   4. The aortic valve is normal in structure. Aortic valve regurgitation is  not visualized. No aortic stenosis is present.   5. The inferior vena cava is normal in size with greater than 50%  respiratory variability, suggesting right atrial pressure of 3 mmHg.     Neuro/Psych  PSYCHIATRIC DISORDERS Anxiety     negative neurological ROS     GI/Hepatic ,neg GERD  ,,(+)     (-) substance abuse    Endo/Other  diabetes  Took Ozempic 6 days ago. Denies GI symptoms today  Renal/GU CRFRenal disease     Musculoskeletal   Abdominal  (+) + obese  Peds  Hematology   Anesthesia Other Findings Past Medical History: No date: CHF (congestive heart failure)  (HCC) No date: Diabetes mellitus without complication (HCC) No date: Hypertension  Reproductive/Obstetrics                              Anesthesia Physical Anesthesia Plan  ASA: 2  Anesthesia Plan: General   Post-op Pain Management: Minimal or no pain anticipated   Induction: Intravenous  PONV Risk Score and Plan: 2 and Propofol infusion, TIVA and Ondansetron  Airway Management Planned: Nasal Cannula  Additional Equipment: None  Intra-op Plan:   Post-operative Plan:   Informed Consent: I have reviewed the patients History and Physical, chart, labs and discussed the procedure including the risks, benefits and alternatives for the proposed anesthesia with the patient or authorized representative who has indicated his/her understanding and acceptance.     Dental advisory given  Plan Discussed with: CRNA and Surgeon  Anesthesia Plan Comments: (Patient currently takes a GLP-1 agonist medication. Recent guidelines from American Society of Anesthesiologists recommend for cessation of weekly injectable medication for one week prior to elective procedure, and of daily medication for one day prior to elective procedure. If these guidelines have not been adhered to, a risk/benefit discussion should be had with patient, and clinical judgement used. I spoke with the patient and proceduralist about the r/b/a of proceeding with elective surgeries in these instances, and both understand and accept the risks of proceeding - Patient  has been on clear liquid diet for 2 days, is 6 days out from Ozempic, and denies GI symptoms. Discussed risks of anesthesia with patient, including aspiration, possibility  of difficulty with spontaneous ventilation under anesthesia necessitating airway intervention, PONV, and rare risks such as cardiac or respiratory or neurological events, and allergic reactions. Discussed the role of CRNA in patient's perioperative care. Patient understands.)          Anesthesia Quick Evaluation

## 2024-01-05 NOTE — Transfer of Care (Signed)
Immediate Anesthesia Transfer of Care Note  Patient: Damiean Lukes  Procedure(s) Performed: COLONOSCOPY WITH PROPOFOL  Patient Location: Endoscopy Unit  Anesthesia Type:General  Level of Consciousness: drowsy and patient cooperative  Airway & Oxygen Therapy: Patient Spontanous Breathing and Patient connected to face mask oxygen  Post-op Assessment: Report given to RN and Post -op Vital signs reviewed and stable  Post vital signs: Reviewed and stable  Last Vitals:  Vitals Value Taken Time  BP 120/82 01/05/24 1134  Temp    Pulse 81 01/05/24 1134  Resp 22 01/05/24 1134  SpO2 97 % 01/05/24 1134  Vitals shown include unfiled device data.  Last Pain:  Vitals:   01/05/24 1133  TempSrc:   PainSc: Asleep         Complications: No notable events documented.

## 2024-01-05 NOTE — Anesthesia Procedure Notes (Signed)
Procedure Name: General with mask airway Date/Time: 01/05/2024 11:11 AM  Performed by: Lily Lovings, CRNAPre-anesthesia Checklist: Patient identified, Emergency Drugs available, Suction available and Patient being monitored Patient Re-evaluated:Patient Re-evaluated prior to induction Oxygen Delivery Method: Simple face mask Preoxygenation: Pre-oxygenation with 100% oxygen Induction Type: IV induction Comments: POM

## 2024-01-05 NOTE — Anesthesia Postprocedure Evaluation (Signed)
Anesthesia Post Note  Patient: Gary Bentley  Procedure(s) Performed: COLONOSCOPY WITH PROPOFOL  Patient location during evaluation: PACU Anesthesia Type: General Level of consciousness: awake and alert Pain management: pain level controlled Vital Signs Assessment: post-procedure vital signs reviewed and stable Respiratory status: spontaneous breathing, nonlabored ventilation, respiratory function stable and patient connected to nasal cannula oxygen Cardiovascular status: blood pressure returned to baseline and stable Postop Assessment: no apparent nausea or vomiting Anesthetic complications: no   No notable events documented.   Last Vitals:  Vitals:   01/05/24 1134 01/05/24 1144  BP: 120/82 127/82  Pulse: 88 76  Resp: 15 16  Temp:    SpO2: 99% 97%    Last Pain:  Vitals:   01/05/24 1144  TempSrc:   PainSc: Asleep                 Corinda Gubler

## 2024-01-06 ENCOUNTER — Encounter: Payer: Self-pay | Admitting: Internal Medicine

## 2024-01-20 ENCOUNTER — Telehealth: Payer: Self-pay | Admitting: Family

## 2024-01-20 NOTE — Progress Notes (Deleted)
 Advanced Heart Failure Clinic Note    PCP: Marcelino Duster, MD (last seen 07/24) Primary Cardiologist: Dorothyann Peng, MD (last seen 07/24)  HPI:  Gary Bentley is a 53 y/o male with a history of DM, HTN, anemia, osteomylitis, CKD, previous tobacco use and chronic heart failure.   Admitted 05/19/23 due to wound infection on rt great toe for several months but over the last month or so, he has noticed that the redness and swelling has began to expand into his ankle and up his calf w/ malodorous purulent discharge from his great toe despite antibiotics. Lower extremity Doppler >> negative for DVT. Right foot x-ray>>evidence of osteomyelitis. Started on Zosyn with podiatry consultation and  underwent amputation of the toe.   Echo 01/10/2019: EF of 15-20% along with trivial AR and mild pulmonic stenosis. Echo 06/03/20: EF 30% with mild LVH and mild PR Echo 06/04/21: EF 30% with mild LVH and mild valvular regurgitation  Echo 05/21/23: EF 45-50% with trivial Gary  Catheterization done 01/13/2019 showed: Nonischemic cardiomyopathy, no significant CAD Mildly elevated wedge pressure,  EF severely depressed, global Hypokinesis, EF 20%  He presents today with a chief complaint of a follow-up visit. Currently has not symptoms and specifically denies fatigue, SOB, chest pain, cough, palpitations, abdominal distention, dizziness, pedal edema or difficulty sleeping. Says that he's gradually gained weight because he's eating "too much"  Has been out of his meds for ~ 3-4 weeks because he says that he doesn't have any refills and didn't think to call.   ROS: All systems negative except as listed in HPI, PMH and Problem List.  SH:  Social History   Socioeconomic History   Marital status: Single    Spouse name: Not on file   Number of children: Not on file   Years of education: Not on file   Highest education level: Not on file  Occupational History   Not on file  Tobacco Use   Smoking status: Former     Current packs/day: 0.00    Types: Cigarettes    Quit date: 04/17/2017    Years since quitting: 6.7   Smokeless tobacco: Never  Vaping Use   Vaping status: Never Used  Substance and Sexual Activity   Alcohol use: No   Drug use: No   Sexual activity: Not Currently  Other Topics Concern   Not on file  Social History Narrative   No place to live at this time, independent otherwise   Social Drivers of Corporate investment banker Strain: Medium Risk (11/03/2023)   Received from [facility]   Overall Financial Resource Strain (CARDIA)    Difficulty of Paying Living Expenses: Somewhat hard  Food Insecurity: Food Insecurity Present (11/03/2023)   Received from [facility]   Hunger Vital Sign    Worried About Running Out of Food in the Last Year: Sometimes true    Ran Out of Food in the Last Year: Sometimes true  Transportation Needs: No Transportation Needs (11/03/2023)   Received from [facility] - Transportation    In the past 12 months, has lack of transportation kept you from medical appointments or from getting medications?: No    Lack of Transportation (Non-Medical): No  Physical Activity: Sufficiently Active (08/25/2019)   Exercise Vital Sign    Days of Exercise per Week: 5 days    Minutes of Exercise per Session: 150+ min  Stress: No Stress Concern Present (08/25/2019)   [employer]  of Occupational Health - Occupational Stress Questionnaire    Feeling of Stress : Not at all  Social Connections: Unknown (09/03/2022)   Received from [facility]   Family and [employer] Support    Help with Day-to-Day Activities: Not on file    Lonely or Isolated: Not on file  Intimate Partner Violence: Not At Risk (05/20/2023)   Humiliation, Afraid, Rape, and Kick questionnaire    Fear of Current or Ex-Partner: No    Emotionally Abused: No    Physically Abused: No    Sexually  Abused: No    FH:  Family History  Family history unknown: Yes    Past Medical History:  Diagnosis Date   CHF (congestive heart failure) (HCC)    Diabetes mellitus without complication (HCC)    Hypertension     Current Outpatient Medications  Medication Sig Dispense Refill   albuterol (VENTOLIN HFA) 108 (90 Base) MCG/ACT inhaler Inhale 1-2 puffs into the lungs every 6 (six) hours as needed for wheezing or shortness of breath. 6.7 g 0   Cysteamine Bitartrate (PROCYSBI) 300 MG PACK Use 1 each 3 (three) times daily Use as instructed. ONE TOUCH DELICA LANCETS E11.9 (Patient not taking: Reported on 08/06/2022)     furosemide (LASIX) 40 MG tablet Take 1 tablet (40 mg total) by mouth daily. 90 tablet 3   insulin aspart protamine - aspart (NOVOLOG MIX 70/30 FLEXPEN) (70-30) 100 UNIT/ML FlexPen Inject 0.1 mLs (10 Units total) into the skin 2 (two) times daily. 15 mL 11   isosorbide mononitrate (IMDUR) 30 MG 24 hr tablet Take 1 tablet (30 mg total) by mouth daily. 90 tablet 3   meloxicam (MOBIC) 15 MG tablet Take 15 mg by mouth daily. (Patient not taking: Reported on 07/22/2023)     metoprolol succinate (TOPROL-XL) 50 MG 24 hr tablet Take 1 tablet (50 mg total) by mouth daily. Take with or immediately following a meal. 90 tablet 3   promethazine-dextromethorphan (PROMETHAZINE-DM) 6.25-15 MG/5ML syrup Take 5 mLs by mouth every 8 (eight) hours as needed for cough. 180 mL 0   sacubitril-valsartan (ENTRESTO) 49-51 MG Take 1 tablet by mouth 2 (two) times daily. 180 tablet 3   spironolactone (ALDACTONE) 25 MG tablet Take 0.5 tablets (12.5 mg total) by mouth daily. 45 tablet 3   No current facility-administered medications for this visit.   There were no vitals filed for this visit.  Wt Readings from Last 3 Encounters:  01/05/24 297 lb 12.8 oz (135.1 kg)  07/22/23 (!) 308 lb 2 oz (139.8 kg)  05/22/23 (!) 307 lb (139.3 kg)   Lab Results  Component Value Date   CREATININE 1.40 (H) 07/22/2023    CREATININE 1.28 (H) 05/22/2023   CREATININE 1.18 05/21/2023    PHYSICAL EXAM:  General:  Well appearing. No resp difficulty HEENT: normal Neck: supple. JVP flat. No lymphadenopathy or thryomegaly appreciated. Cor: PMI normal. Regular rate & rhythm. No rubs, gallops or murmurs. Lungs: clear Abdomen: soft, nontender, nondistended. No hepatosplenomegaly. No bruits or masses.  Extremities: no cyanosis, clubbing, rash, edema Neuro: alert & orientedx3, cranial nerves grossly intact. Moves all 4 extremities w/o difficulty. Affect pleasant.   ECG: not done   ASSESSMENT & PLAN:  1: NICM with mildly reduced ejection fraction- - suspect due to HTN/ DM as 12/2018 cath showed nonobstructive CAD - euvolemic today - weighing daily; reminded to call for an overnight weight gain of >2 pounds or a weekly weight gain of >5 pounds -  weight up 13 pounds since last visit here 8 months ago - Echo 01/10/2019: EF of 15-20% along with trivial AR and mild pulmonic stenosis. - Echo 06/03/20: EF 30% with mild LVH and mild PR - Echo 06/04/21: EF 30% with mild LVH and mild valvular regurgitation  - Echo 05/21/23: EF 45-50% with trivial Gary - not adding salt and is trying to limit his sodium intake although does eat out - saw cardiology Juliann Pares) 07/24 - has been out of meds for several weeks but he ran out of refills; emphasized calling here if that happens so that he isn't without his medications - refilled furosemide 40mg  daily - refilled isosorbide 30mg  daily - refilled metoprolol succinate 50mg  daily - refilled entresto 49/51mg  BID - refilled spironolactone 12.5mg  daily - BMP today to decide about potassium supplements - BNP 07/10/2019 was 968.0  2: HTN- - BP 136/92 but he's been out meds for several weeks; refilled today - saw PCP Letitia Libra) 07/24 - BMP 05/22/23 showed sodium 135, potassium 4.1, creatinine 1.28 and GFR >60  3: DM- - A1c 05/19/23 was 7.9%  Return in 1 month, sooner if needed.       Delma Freeze, FNP 01/20/24

## 2024-01-20 NOTE — Telephone Encounter (Signed)
 Lvm to confirm appt for 01/21/24

## 2024-01-21 ENCOUNTER — Telehealth: Payer: Self-pay | Admitting: Family

## 2024-01-21 ENCOUNTER — Encounter: Payer: BC Managed Care – PPO | Admitting: Family

## 2024-01-21 NOTE — Telephone Encounter (Signed)
 Pt confirmed appt for 01/24/24

## 2024-01-22 NOTE — Progress Notes (Unsigned)
 Advanced [facility] Failure Clinic Note    PCP: Marcelino Duster, MD (last seen 10/24) Primary Cardiologist: Dorothyann Peng, MD (last seen 07/24)  Chief Complaint: heart failure clinic follow-up  HPI:  Gary Bentley is a 53 y/o male with a history of DM, HTN, anemia, osteomylitis, CKD, previous tobacco use and chronic heart failure.   Admitted 05/19/23 due to wound infection on rt great toe for several months but over the last month or so, he has noticed that the redness and swelling has began to expand into his ankle and up his calf w/ malodorous purulent discharge from his great toe despite antibiotics. Lower extremity Doppler >> negative for DVT. Right foot x-ray>>evidence of osteomyelitis. Started on Zosyn with podiatry consultation and  underwent amputation of the toe.   Catheterization done 01/13/2019: Nonischemic cardiomyopathy, no significant CAD Mildly elevated wedge pressure,  EF severely depressed, global Hypokinesis, EF 20%  Echo 01/10/2019: EF of 15-20% along with trivial AR and mild pulmonic stenosis. Echo 06/03/20: EF 30% with mild LVH and mild PR Echo 06/04/21: EF 30% with mild LVH and mild valvular regurgitation  Echo 05/21/23: EF 45-50% with trivial Gary  He presents today with a chief complaint of a HF follow-up visit. He currently denies any shortness of breath, chest pain, palpitations, abdominal distention, pedal edema, dizziness, weight gain or difficulty sleeping. 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 He estimates that he's been without his other medications for the last 3-4 months.   ROS: All systems negative except as listed in HPI, PMH and Problem List.  SH:  Social History   Socioeconomic History   Marital status: Single    Spouse name: Not on file   Number of children: Not on file   Years of education: Not on file   Highest  education level: Not on file  Occupational History   Not on file  Tobacco Use   Smoking status: Former    Current packs/day: 0.00    Types: Cigarettes    Quit date: 04/17/2017    Years since quitting: 6.7   Smokeless tobacco: Never  Vaping Use   Vaping status: Never Used  Substance and Sexual Activity   Alcohol use: No   Drug use: No   Sexual activity: Not Currently  Other Topics Concern   Not on file  Social History Narrative   No place to live at this time, independent otherwise   Social Drivers of Corporate investment banker Strain: Medium Risk (11/03/2023)   Received from [facility]   Overall Financial Resource Strain (CARDIA)    Difficulty of Paying Living Expenses: Somewhat hard  Food Insecurity: Food Insecurity Present (11/03/2023)   Received from [facility]   Hunger Vital Sign    Worried About Running Out of Food in the Last Year: Sometimes true    Ran Out of Food in the Last Year: Sometimes true  Transportation Needs: No Transportation Needs (11/03/2023)   Received from [facility] - Transportation    In the past 12 months, has lack of transportation kept you from medical appointments or from getting medications?: No    Lack of Transportation (Non-Medical): No  Physical Activity: Sufficiently Active (08/25/2019)   Exercise Vital Sign    Days of Exercise per Week: 5 days    Minutes of Exercise per Session: 150+ min  Stress: No Stress Concern Present (08/25/2019)   [employer] of Occupational Health - Occupational Stress Questionnaire    Feeling of Stress : Not at all  Social Connections: Unknown (09/03/2022)   Received from [facility]   Family and [employer] Support    Help with Day-to-Day Activities: Not on file    Lonely or Isolated: Not on file  Intimate Partner Violence: Not At Risk (05/20/2023)   Humiliation, Afraid, Rape, and Kick questionnaire     Fear of Current or Ex-Partner: No    Emotionally Abused: No    Physically Abused: No    Sexually Abused: No    FH:  Family History  Family history unknown: Yes    Past Medical History:  Diagnosis Date   CHF (congestive heart failure) (HCC)    Diabetes mellitus without complication (HCC)    Hypertension     Current Outpatient Medications  Medication Sig Dispense Refill   albuterol (VENTOLIN HFA) 108 (90 Base) MCG/ACT inhaler Inhale 1-2 puffs into the lungs every 6 (six) hours as needed for wheezing or shortness of breath. 6.7 g 0   Cysteamine Bitartrate (PROCYSBI) 300 MG PACK Use 1 each 3 (three) times daily Use as instructed. ONE TOUCH DELICA LANCETS E11.9 (Patient not taking: Reported on 08/06/2022)     furosemide (LASIX) 40 MG tablet Take 1 tablet (40 mg total) by mouth daily. 90 tablet 3   insulin aspart protamine - aspart (NOVOLOG MIX 70/30 FLEXPEN) (70-30) 100 UNIT/ML FlexPen Inject 0.1 mLs (10 Units total) into the skin 2 (two) times daily. 15 mL 11   isosorbide mononitrate (IMDUR) 30 MG 24 hr tablet Take 1 tablet (30 mg total) by mouth daily. 90 tablet 3   meloxicam (MOBIC) 15 MG tablet Take 15 mg by mouth daily. (Patient not taking: Reported on 07/22/2023)     metoprolol succinate (TOPROL-XL) 50 MG 24 hr tablet Take 1 tablet (50 mg total) by mouth daily. Take with or immediately following a meal. 90 tablet 3   promethazine-dextromethorphan (PROMETHAZINE-DM) 6.25-15 MG/5ML syrup Take 5 mLs by mouth every 8 (eight) hours as needed for cough. 180 mL 0   sacubitril-valsartan (ENTRESTO) 49-51 MG Take 1 tablet by mouth 2 (two) times daily. 180 tablet 3   spironolactone (ALDACTONE) 25 MG tablet Take 0.5 tablets (12.5 mg total) by mouth daily. 45 tablet 3   No current facility-administered medications for this visit.   Vitals:   01/24/24 0838  BP: (!) 131/98  Pulse: 94  SpO2: 98%  Weight: (!) 301 lb (136.5 kg)   Wt Readings from Last 3 Encounters:  01/24/24 (!) 301 lb  (136.5 kg)  01/05/24 297 lb 12.8 oz (135.1 kg)  07/22/23 (!) 308 lb 2 oz (139.8 kg)   Lab Results  Component Value Date   CREATININE 1.40 (H) 07/22/2023   CREATININE 1.28 (H) 05/22/2023   CREATININE 1.18 05/21/2023   PHYSICAL EXAM:  General: Well appearing. No resp difficulty HEENT: normal Neck: supple, no JVD Cor: Regular rhythm, rate. No rubs, gallops or murmurs Lungs: clear Abdomen: soft, nontender, nondistended. Extremities: no cyanosis, clubbing, rash, edema Neuro: alert & oriented X 3. Moves all 4 extremities w/o difficulty. Affect pleasant   ECG: not done   ASSESSMENT & PLAN:  1: NICM with mildly reduced ejection fraction- - suspect due to HTN/ DM as 12/2018 cath showed nonobstructive CAD - euvolemic today - weighing daily; reminded to call for an overnight weight gain of >2 pounds or a weekly  weight gain of >5 pounds - weight down 7 pounds since last visit here 6 months ago - Echo 01/10/2019: EF of 15-20% along with trivial AR and mild pulmonic stenosis. - Echo 06/03/20: EF 30% with mild LVH and mild PR - Echo 06/04/21: EF 30% with mild LVH and mild valvular regurgitation  - Echo 05/21/23: EF 45-50% with trivial Gary - not adding salt and is trying to limit his sodium intake although does eat out - saw cardiology Juliann Pares) 07/24; placed his office # on his AVS and emphasized that he needed to call him - continue farxiga 10mg  daily - continue furosemide 40mg  daily - continue metoprolol succinate 50mg  daily - resume entresto 24/26mg  BID - plan to resume spironolactone 12.5mg  daily @ next visit - commercial copay cards provided for farxiga and entresto - BMET today - BNP 07/10/2019 was 968.0  2: HTN- - BP 131/98 but he's been without meds for several months; resuming entresto per above - saw PCP Letitia Libra) 10/24 - BMP 05/22/23 reviewed and showed sodium 141, potassium 4.5, creatinine 1.4 and GFR 61 - BMET today  3: DM- - A1c 05/19/23 was 7.9%  4: Obesity- - using  ozempic weekly - weight down 7 pounds by our scale   Return in 1 month, sooner if needed.   Delma Freeze, FNP 01/22/24

## 2024-01-24 ENCOUNTER — Encounter: Payer: Self-pay | Admitting: Family

## 2024-01-24 ENCOUNTER — Ambulatory Visit: Payer: BC Managed Care – PPO | Attending: Family | Admitting: Family

## 2024-01-24 VITALS — BP 131/98 | HR 94 | Wt 301.0 lb

## 2024-01-24 DIAGNOSIS — I5022 Chronic systolic (congestive) heart failure: Secondary | ICD-10-CM | POA: Diagnosis not present

## 2024-01-24 DIAGNOSIS — Z6835 Body mass index (BMI) 35.0-35.9, adult: Secondary | ICD-10-CM | POA: Diagnosis not present

## 2024-01-24 DIAGNOSIS — E1169 Type 2 diabetes mellitus with other specified complication: Secondary | ICD-10-CM | POA: Diagnosis not present

## 2024-01-24 DIAGNOSIS — I509 Heart failure, unspecified: Secondary | ICD-10-CM | POA: Diagnosis not present

## 2024-01-24 DIAGNOSIS — E669 Obesity, unspecified: Secondary | ICD-10-CM | POA: Insufficient documentation

## 2024-01-24 DIAGNOSIS — Z87891 Personal history of nicotine dependence: Secondary | ICD-10-CM | POA: Diagnosis not present

## 2024-01-24 DIAGNOSIS — I428 Other cardiomyopathies: Secondary | ICD-10-CM | POA: Insufficient documentation

## 2024-01-24 DIAGNOSIS — Z794 Long term (current) use of insulin: Secondary | ICD-10-CM

## 2024-01-24 DIAGNOSIS — I10 Essential (primary) hypertension: Secondary | ICD-10-CM | POA: Diagnosis not present

## 2024-01-24 DIAGNOSIS — I130 Hypertensive heart and chronic kidney disease with heart failure and stage 1 through stage 4 chronic kidney disease, or unspecified chronic kidney disease: Secondary | ICD-10-CM | POA: Diagnosis not present

## 2024-01-24 DIAGNOSIS — E1122 Type 2 diabetes mellitus with diabetic chronic kidney disease: Secondary | ICD-10-CM | POA: Diagnosis not present

## 2024-01-24 DIAGNOSIS — N189 Chronic kidney disease, unspecified: Secondary | ICD-10-CM | POA: Insufficient documentation

## 2024-01-24 DIAGNOSIS — E119 Type 2 diabetes mellitus without complications: Secondary | ICD-10-CM | POA: Diagnosis not present

## 2024-01-24 DIAGNOSIS — M868X7 Other osteomyelitis, ankle and foot: Secondary | ICD-10-CM | POA: Insufficient documentation

## 2024-01-24 DIAGNOSIS — D631 Anemia in chronic kidney disease: Secondary | ICD-10-CM | POA: Insufficient documentation

## 2024-01-24 MED ORDER — SACUBITRIL-VALSARTAN 24-26 MG PO TABS: 1.0000 | ORAL_TABLET | Freq: Two times a day (BID) | ORAL | 11 refills | Status: DC

## 2024-01-24 NOTE — Patient Instructions (Addendum)
 START Entresto 24/26 mg Twice daily  Go DOWN to LOWER LEVEL (LL) to have your blood work completed inside of [employer] office.  We will only call you if the results are abnormal or if the provider would like to make medication changes.  Call Dr. Glennis Brink office at [phone] to get an appointment scheduled.   Your physician recommends that you schedule a follow-up appointment in: 1 month.  If you have any questions or concerns before your next appointment please send [place] a message through [place] or call our office at [phone]    At the [facility], you and your health needs are our priority. As part of our continuing mission to provide you with exceptional heart care, we have created designated Provider Care Teams. These Care Teams include your primary Cardiologist (physician) and Advanced Practice Providers (APPs- Physician Assistants and Nurse Practitioners) who all work together to provide you with the care you need, when you need it.   You may see any of the following providers on your designated Care Team at your next follow up: Dr Arvilla Meres Dr Marca Ancona Dr. Dorthula Nettles Dr. Clearnce Hasten Amy Filbert Schilder, NP Robbie Lis, [place] [facility] [place], [place] Brynda Peon, NP [place] Lee, NP Clarisa Kindred, NP Karle Plumber, PharmD Enos Fling, PharmD   Please be sure to bring in all your medications bottles to every appointment.    Thank you for choosing [facility]

## 2024-01-25 LAB — BASIC METABOLIC PANEL
BUN/Creatinine Ratio: 9 (ref 9–20)
BUN: 10 mg/dL (ref 6–24)
CO2: 23 mmol/L (ref 20–29)
Calcium: 9.4 mg/dL (ref 8.7–10.2)
Chloride: 107 mmol/L — ABNORMAL HIGH (ref 96–106)
Creatinine, Ser: 1.09 mg/dL (ref 0.76–1.27)
Glucose: 70 mg/dL (ref 70–99)
Potassium: 3.8 mmol/L (ref 3.5–5.2)
Sodium: 142 mmol/L (ref 134–144)
eGFR: 82 mL/min/{1.73_m2} (ref >59)

## 2024-02-01 DIAGNOSIS — Z794 Long term (current) use of insulin: Secondary | ICD-10-CM | POA: Diagnosis not present

## 2024-02-01 DIAGNOSIS — E119 Type 2 diabetes mellitus without complications: Secondary | ICD-10-CM | POA: Diagnosis not present

## 2024-02-01 DIAGNOSIS — M79644 Pain in right finger(s): Secondary | ICD-10-CM | POA: Diagnosis not present

## 2024-03-14 ENCOUNTER — Telehealth: Payer: Self-pay | Admitting: Family

## 2024-03-14 NOTE — Progress Notes (Deleted)
 Advanced Heart Failure Clinic Note    PCP: Martine Sleek, MD (last seen 10/24) Primary Cardiologist: Burney Carter, MD (last seen 07/24)  Chief Complaint: heart failure clinic follow-up  HPI:  Gary Bentley is a 53 y/o male with a history of DM, HTN, anemia, osteomylitis, CKD, previous tobacco use and chronic heart failure.   Admitted 05/19/23 due to wound infection on rt great toe for several months but over the last month or so, he has noticed that the redness and swelling has began to expand into his ankle and up his calf w/ malodorous purulent discharge from his great toe despite antibiotics. Lower extremity Doppler >> negative for DVT. Right foot x-ray>>evidence of osteomyelitis. Started on Zosyn  with podiatry consultation and  underwent amputation of the toe.   Catheterization done 01/13/2019: Nonischemic cardiomyopathy, no significant CAD Mildly elevated wedge pressure,  EF severely depressed, global Hypokinesis, EF 20%  Echo 01/10/2019: EF of 15-20% along with trivial AR and mild pulmonic stenosis. Echo 06/03/20: EF 30% with mild LVH and mild PR Echo 06/04/21: EF 30% with mild LVH and mild valvular regurgitation  Echo 05/21/23: EF 45-50% with trivial Gary  He presents today with a chief complaint of a HF follow-up visit. He currently denies any shortness of breath, chest pain, palpitations, abdominal distention, pedal edema, dizziness, weight gain or difficulty sleeping. 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 He estimates that he's been without his other medications for the last 3-4 months.   ROS: All systems negative except as listed in HPI, PMH and Problem List.  SH:  Social History   Socioeconomic History   Marital status: Single    Spouse name: Not on file   Number of children: Not on file   Years of education: Not on file   Highest  education level: Not on file  Occupational History   Not on file  Tobacco Use   Smoking status: Former    Current packs/day: 0.00    Types: Cigarettes    Quit date: 04/17/2017    Years since quitting: 6.9   Smokeless tobacco: Never  Vaping Use   Vaping status: Never Used  Substance and Sexual Activity   Alcohol use: No   Drug use: No   Sexual activity: Not Currently  Other Topics Concern   Not on file  Social History Narrative   No place to live at this time, independent otherwise   Social Drivers of Corporate investment banker Strain: Medium Risk (11/03/2023)   Received from [facility]   Overall Financial Resource Strain (CARDIA)    Difficulty of Paying Living Expenses: Somewhat hard  Food Insecurity: Food Insecurity Present (11/03/2023)   Received from [facility]   Hunger Vital Sign    Worried About Running Out of Food in the Last Year: Sometimes true    Ran Out of Food in the Last Year: Sometimes true  Transportation Needs: No Transportation Needs (11/03/2023)   Received from [facility] - Transportation    In the past 12 months, has lack of transportation kept you from medical appointments or from getting medications?: No    Lack of Transportation (Non-Medical): No  Physical Activity: Sufficiently Active (08/25/2019)   Exercise Vital Sign    Days of Exercise per Week: 5 days    Minutes of Exercise per Session: 150+ min  Stress: No Stress Concern Present (08/25/2019)   [employer] of Occupational Health - Occupational Stress Questionnaire    Feeling of Stress : Not at all  Social Connections: Unknown (09/03/2022)   Received from [facility]   Family and [employer] Support    Help with Day-to-Day Activities: Not on file    Lonely or Isolated: Not on file  Intimate Partner Violence: Not At Risk (05/20/2023)   Humiliation, Afraid, Rape, and Kick questionnaire     Fear of Current or Ex-Partner: No    Emotionally Abused: No    Physically Abused: No    Sexually Abused: No    FH:  Family History  Family history unknown: Yes    Past Medical History:  Diagnosis Date   CHF (congestive heart failure) (HCC)    Diabetes mellitus without complication (HCC)    Hypertension     Current Outpatient Medications  Medication Sig Dispense Refill   albuterol  (VENTOLIN  HFA) 108 (90 Base) MCG/ACT inhaler Inhale 1-2 puffs into the lungs every 6 (six) hours as needed for wheezing or shortness of breath. 6.7 g 0   Cysteamine Bitartrate (PROCYSBI) 300 MG PACK Use 1 each 3 (three) times daily Use as instructed. ONE TOUCH DELICA LANCETS E11.9 (Patient not taking: Reported on 01/24/2024)     dapagliflozin  propanediol (FARXIGA ) 10 MG TABS tablet Take by mouth daily.     furosemide  (LASIX ) 40 MG tablet Take 1 tablet (40 mg total) by mouth daily. 90 tablet 3   insulin  aspart protamine - aspart (NOVOLOG  MIX 70/30 FLEXPEN) (70-30) 100 UNIT/ML FlexPen Inject 0.1 mLs (10 Units total) into the skin 2 (two) times daily. 15 mL 11   isosorbide  mononitrate (IMDUR ) 30 MG 24 hr tablet Take 1 tablet (30 mg total) by mouth daily. (Patient not taking: Reported on 01/24/2024) 90 tablet 3   meloxicam (MOBIC) 15 MG tablet Take 15 mg by mouth daily. (Patient not taking: Reported on 01/24/2024)     metoprolol  succinate (TOPROL -XL) 50 MG 24 hr tablet Take 1 tablet (50 mg total) by mouth daily. Take with or immediately following a meal. 90 tablet 3   OZEMPIC, 0.25 OR 0.5 MG/DOSE, 2 MG/3ML SOPN Inject into the skin once a week.     sacubitril -valsartan  (ENTRESTO ) 24-26 MG Take 1 tablet by mouth 2 (two) times daily. 60 tablet 11   spironolactone  (ALDACTONE ) 25 MG tablet Take 0.5 tablets (12.5 mg total) by mouth daily. (Patient not taking: Reported on 01/24/2024) 45 tablet 3   No current facility-administered medications for this visit.   There were no vitals filed for this visit.  Wt Readings from  Last 3 Encounters:  01/24/24 (!) 301 lb (136.5 kg)  01/05/24 297 lb 12.8 oz (135.1 kg)  07/22/23 (!) 308 lb 2 oz (139.8 kg)   Lab Results  Component Value Date   CREATININE 1.09 01/24/2024   CREATININE 1.40 (H) 07/22/2023   CREATININE 1.28 (H) 05/22/2023   PHYSICAL EXAM:  General: Well appearing. No resp difficulty HEENT: normal Neck: supple, no JVD Cor: Regular rhythm, rate. No rubs, gallops or murmurs Lungs: clear Abdomen: soft, nontender, nondistended. Extremities: no cyanosis, clubbing, rash, edema Neuro: alert & oriented X 3. Moves all 4 extremities w/o difficulty. Affect pleasant   ECG: not done   ASSESSMENT & PLAN:  1: NICM with mildly reduced ejection fraction- - suspect due to HTN/ DM as 12/2018 cath showed nonobstructive CAD - euvolemic today - weighing daily; reminded to call for an overnight  weight gain of >2 pounds or a weekly weight gain of >5 pounds - weight down 7 pounds since last visit here 6 months ago - Echo 01/10/2019: EF of 15-20% along with trivial AR and mild pulmonic stenosis. - Echo 06/03/20: EF 30% with mild LVH and mild PR - Echo 06/04/21: EF 30% with mild LVH and mild valvular regurgitation  - Echo 05/21/23: EF 45-50% with trivial Gary - not adding salt and is trying to limit his sodium intake although does eat out - saw cardiology Beau Bound) 07/24; placed his office # on his AVS and emphasized that he needed to call him - continue farxiga  10mg  daily - continue furosemide  40mg  daily - continue metoprolol  succinate 50mg  daily - resume entresto  24/26mg  BID - plan to resume spironolactone  12.5mg  daily @ next visit - commercial copay cards provided for farxiga  and entresto  - BMET today - BNP 07/10/2019 was 968.0  2: HTN- - BP 131/98 but he's been without meds for several months; resuming entresto  per above - saw PCP Marisa Sickles) 10/24 - BMP 05/22/23 reviewed and showed sodium 141, potassium 4.5, creatinine 1.4 and GFR 61 - BMET today  3: DM- - A1c  05/19/23 was 7.9%  4: Obesity- - using ozempic weekly - weight down 7 pounds by our scale   Return in 1 month, sooner if needed.   Charlette Console, FNP 03/14/24

## 2024-03-14 NOTE — Telephone Encounter (Signed)
 Called to confirm/remind patient of their appointment at the [facility] on 03/15/24.   Appointment:   [] Confirmed  [x] Left mess   [] No answer/No voice mail  [] VM Full/unable to leave message  [] Phone not in service  Patient reminded to bring all medications and/or complete list.  Confirmed patient has transportation. Gave directions, instructed to utilize valet parking.

## 2024-03-15 ENCOUNTER — Encounter: Admitting: Family

## 2024-03-21 DIAGNOSIS — B351 Tinea unguium: Secondary | ICD-10-CM | POA: Diagnosis not present

## 2024-03-21 DIAGNOSIS — L97512 Non-pressure chronic ulcer of other part of right foot with fat layer exposed: Secondary | ICD-10-CM | POA: Diagnosis not present

## 2024-03-21 DIAGNOSIS — D2371 Other benign neoplasm of skin of right lower limb, including hip: Secondary | ICD-10-CM | POA: Diagnosis not present

## 2024-03-21 DIAGNOSIS — D2372 Other benign neoplasm of skin of left lower limb, including hip: Secondary | ICD-10-CM | POA: Diagnosis not present

## 2024-03-28 ENCOUNTER — Telehealth: Payer: Self-pay | Admitting: Family

## 2024-03-28 NOTE — Telephone Encounter (Signed)
Called to r/s missed appt

## 2024-04-01 ENCOUNTER — Encounter: Payer: Self-pay | Admitting: Emergency Medicine

## 2024-04-01 ENCOUNTER — Emergency Department

## 2024-04-01 ENCOUNTER — Other Ambulatory Visit: Payer: Self-pay

## 2024-04-01 ENCOUNTER — Emergency Department: Admission: EM | Admit: 2024-04-01 | Discharge: 2024-04-01 | Disposition: A | Discharge status: Home / Self Care

## 2024-04-01 DIAGNOSIS — I509 Heart failure, unspecified: Secondary | ICD-10-CM | POA: Diagnosis not present

## 2024-04-01 DIAGNOSIS — M25372 Other instability, left ankle: Secondary | ICD-10-CM | POA: Diagnosis not present

## 2024-04-01 DIAGNOSIS — X501XXA Overexertion from prolonged static or awkward postures, initial encounter: Secondary | ICD-10-CM | POA: Diagnosis not present

## 2024-04-01 DIAGNOSIS — Y9301 Activity, walking, marching and hiking: Secondary | ICD-10-CM | POA: Insufficient documentation

## 2024-04-01 DIAGNOSIS — I110 Hypertensive heart disease with heart failure: Secondary | ICD-10-CM | POA: Diagnosis not present

## 2024-04-01 DIAGNOSIS — E119 Type 2 diabetes mellitus without complications: Secondary | ICD-10-CM | POA: Insufficient documentation

## 2024-04-01 DIAGNOSIS — M19072 Primary osteoarthritis, left ankle and foot: Secondary | ICD-10-CM | POA: Diagnosis not present

## 2024-04-01 DIAGNOSIS — M25572 Pain in left ankle and joints of left foot: Secondary | ICD-10-CM | POA: Diagnosis not present

## 2024-04-01 DIAGNOSIS — M25472 Effusion, left ankle: Secondary | ICD-10-CM | POA: Diagnosis not present

## 2024-04-01 MED ORDER — KETOROLAC TROMETHAMINE 30 MG/ML IJ SOLN
30.0000 mg | Freq: Once | INTRAMUSCULAR | Status: AC
  Administered 2024-04-01: 30 mg via INTRAMUSCULAR
  Filled 2024-04-01: qty 1

## 2024-04-01 NOTE — ED Notes (Signed)
 Pt states they twisted their ankle on a step.

## 2024-04-01 NOTE — ED Provider Notes (Signed)
 [facility] Emergency Department Provider Note     Event Date/Time   First MD Initiated Contact with Patient 04/01/24 1754     (approximate)   History   Ankle Pain   HPI  Gary Bentley is a 53 y.o. male with a hx of HTN, CHF and diabetes mellitus presents to the ED for evaluation of left ankle pain for a couple days.  Patient reports he rolled his foot at work a couple of days ago while walking. Pain is described as a throbbing sensation on the left side of his ankle that is worse with ambulation. Denies numbness and weakness.  Patient has not tried anything for pain.    Physical Exam   Triage Vital Signs: ED Triage Vitals  Encounter Vitals Group     BP 04/01/24 1750 110/74     Systolic BP Percentile --      Diastolic BP Percentile --      Pulse Rate 04/01/24 1750 94     Resp 04/01/24 1750 18     Temp 04/01/24 1750 98.4 F (36.9 C)     Temp Source 04/01/24 1750 Oral     SpO2 04/01/24 1750 98 %     Weight 04/01/24 1748 280 lb (127 kg)     Height 04/01/24 1748 6' 5.5" (1.969 m)     Head Circumference --      Peak Flow --      Pain Score 04/01/24 1748 9     Pain Loc --      Pain Education --      Exclude from Growth Chart --     Most recent vital signs: Vitals:   04/01/24 1750  BP: 110/74  Pulse: 94  Resp: 18  Temp: 98.4 F (36.9 C)  SpO2: 98%    General Awake, no distress.  HEENT NCAT. PERRL. EOMI. No rhinorrhea. Mucous membranes are moist.  CV:  Good peripheral perfusion.  RESP:  Normal effort.  ABD:  No distention.  Other:  Left ankle reveals moderate swelling to lateral aspect. Tenderness to lateral malleolus.  Tenderness with plantarflexion.  Neurovascular status intact all throughout.    ED Results / Procedures / Treatments   Labs (all labs ordered are listed, but only abnormal results are displayed) Labs Reviewed - No data to display  RADIOLOGY  I personally viewed and evaluated these images as part of my medical  decision making, as well as reviewing the written report by the radiologist.  ED Provider Interpretation: No acute bony abnormality  DG Ankle Complete Left Result Date: 04/01/2024 CLINICAL DATA:  Twisting injury, ankle pain. EXAM: LEFT ANKLE COMPLETE - 3+ VIEW COMPARISON:  Foot radiograph 03/22/2018 FINDINGS: No evidence of acute fracture or dislocation. Chronic degenerative change of the distal fibula and medial malleolus. There is mild talonavicular spurring. Change in alignment of the subtalar joint from prior exam, likely progressive chronic change. There is a small ankle joint effusion. Mild soft tissue edema. IMPRESSION: 1. No acute fracture or dislocation. 2. Small ankle joint effusion. 3. 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 Electronically Signed   By: Chadwick Colonel M.D.   On: 04/01/2024 21:27    PROCEDURES:  Critical Care performed: No  Procedures   MEDICATIONS ORDERED IN ED: Medications  ketorolac (TORADOL) 30 MG/ML injection 30 mg (30 mg Intramuscular Given 04/01/24 1806)     IMPRESSION / MDM / ASSESSMENT AND PLAN / ED COURSE  I reviewed the triage vital signs  and the nursing notes.                                 53 y.o. male presents to the emergency department for evaluation and treatment of acute left ankle pain. See HPI for further details.   Differential diagnosis includes, but is not limited to fracture, dislocation, ligamentous injury, contusion  Patient's presentation is most consistent with acute complicated illness / injury requiring diagnostic workup.  Plan to obtain xray. ED pain management with IM Toradol.   X-ray results revealed no acute bony abnormality.  There is a small joint effusion and mild soft tissue swelling.  Patient will be placed in cam boot.  RICE therapy education provided.  Patient is encouraged to follow-up with orthopedic if symptoms do not improve in 1 week.  Patient stable condition for discharge home.  ED  return precautions discussed.   FINAL CLINICAL IMPRESSION(S) / ED DIAGNOSES   Final diagnoses:  Acute left ankle pain     Rx / DC Orders   ED Discharge Orders     None      Note:  This document was prepared using Dragon voice recognition software and may include unintentional dictation errors.    Phyllis Breeze, Chanell Nadeau A, PA-C 04/01/24 2240    Collis Deaner, MD 04/02/24 [place]

## 2024-04-01 NOTE — Discharge Instructions (Signed)
 You were evaluated in the ED for left ankle pain.  Your x-ray is normal and shows no broken bone.  There is some swelling in which we will place you in a cam boot for comfort and compression.  Please review RICE therapy for routine care of injuries.  Ice the affected area twice daily 20 minutes on 20 minutes off.  If symptoms do not improve in 1 week please follow-up with orthopedics for further evaluation.

## 2024-04-05 ENCOUNTER — Telehealth: Payer: Self-pay | Admitting: Family

## 2024-04-05 MED ORDER — DAPAGLIFLOZIN PROPANEDIOL 10 MG PO TABS: 10.0000 mg | ORAL_TABLET | Freq: Every day | ORAL | 3 refills | Status: DC

## 2024-04-05 MED ORDER — ENTRESTO 24-26 MG PO TABS: 1.0000 | ORAL_TABLET | Freq: Two times a day (BID) | ORAL | 3 refills | Status: DC

## 2024-04-05 NOTE — Telephone Encounter (Signed)
 Medications sent to pharmacy

## 2024-04-18 ENCOUNTER — Telehealth: Payer: Self-pay | Admitting: Family

## 2024-04-18 NOTE — Progress Notes (Deleted)
 Advanced [facility] Failure Clinic Note    PCP: Martine Sleek, MD (last seen 03/25) Primary Cardiologist: Burney Carter, MD (last seen 07/24)  Chief Complaint:   HPI:  Gary Bentley is a 53 y/o male with a history of DM, HTN, anemia, osteomylitis, CKD, previous tobacco use and chronic heart failure.   Echo 01/10/2019: EF of 15-20% along with trivial AR and mild pulmonic stenosis.  R/ LHC 01/13/2019: Nonischemic cardiomyopathy, no significant CAD Mildly elevated wedge pressure,  EF severely depressed, global Hypokinesis, EF 20%  Echo 06/03/20: EF 30% with mild LVH and mild PR Echo 06/04/21: EF 30% with mild LVH and mild valvular regurgitation   Admitted 05/19/23 due to wound infection on rt great toe for several months but over the last month or so, he has noticed that the redness and swelling has began to expand into his ankle and up his calf w/ malodorous purulent discharge from his great toe despite antibiotics. Lower extremity Doppler >> negative for DVT. Echo 05/21/23: EF 45-50% with trivial Gary. Right foot x-ray>>evidence of osteomyelitis. Started on Zosyn  with podiatry consultation and  underwent amputation of the toe.   Seen in [facility] 03/25 and entresto  24/26mg  BID was resumed.   Was in the ED 04/01/24 with left ankle pain for a few days after rolling his foot at work. X-ray results revealed no acute bony abnormality. There is a small joint effusion and mild soft tissue swelling. Patient will be placed in cam boot. RICE therapy education provided.    He presents today with a chief complaint of a HF follow-up visit.   ROS: All systems negative except as listed in HPI, PMH and Problem List.  SH:  Social History   Socioeconomic History   Marital status: Single    Spouse name: Not on file   Number of children: Not on file   Years of education: Not on file   Highest education level: Not on file  Occupational History   Not on file  Tobacco Use   Smoking status: Former    Current  packs/day: 0.00    Types: Cigarettes    Quit date: 04/17/2017    Years since quitting: 7.0   Smokeless tobacco: Never  Vaping Use   Vaping status: Never Used  Substance and Sexual Activity   Alcohol use: No   Drug use: No   Sexual activity: Not Currently  Other Topics Concern   Not on file  Social History Narrative   No place to live at this time, independent otherwise   Social Drivers of Corporate investment banker Strain: Medium Risk (11/03/2023)   Received from [facility]   Overall Financial Resource Strain (CARDIA)    Difficulty of Paying Living Expenses: Somewhat hard  Food Insecurity: Food Insecurity Present (11/03/2023)   Received from [facility]   Hunger Vital Sign    Worried About Running Out of Food in the Last Year: Sometimes true    Ran Out of Food in the Last Year: Sometimes true  Transportation Needs: No Transportation Needs (11/03/2023)   Received from [facility] - Transportation    In the past 12 months, has lack of transportation kept you from medical appointments or from getting medications?: No    Lack of Transportation (Non-Medical): No  Physical Activity: Sufficiently Active (08/25/2019)   Exercise Vital Sign    Days of Exercise per Week: 5 days    Minutes of Exercise per Session: 150+ min  Stress: No Stress Concern Present (08/25/2019)   [employer] of Occupational Health - Occupational Stress Questionnaire    Feeling of Stress : Not at all  Social Connections: Unknown (09/03/2022)   Received from [facility]   Family and [employer] Support    Help with Day-to-Day Activities: Not on file    Lonely or Isolated: Not on file  Intimate Partner Violence: Not At Risk (05/20/2023)   Humiliation, Afraid, Rape, and Kick questionnaire    Fear of Current or Ex-Partner: No    Emotionally Abused: No    Physically Abused: No    Sexually Abused: No     FH:  Family History  Family history unknown: Yes    Past Medical History:  Diagnosis Date   CHF (congestive heart failure) (HCC)    Diabetes mellitus without complication (HCC)    Hypertension     Current Outpatient Medications  Medication Sig Dispense Refill   albuterol  (VENTOLIN  HFA) 108 (90 Base) MCG/ACT inhaler Inhale 1-2 puffs into the lungs every 6 (six) hours as needed for wheezing or shortness of breath. 6.7 g 0   Cysteamine Bitartrate (PROCYSBI) 300 MG PACK Use 1 each 3 (three) times daily Use as instructed. ONE TOUCH DELICA LANCETS E11.9 (Patient not taking: Reported on 01/24/2024)     dapagliflozin  propanediol (FARXIGA ) 10 MG TABS tablet Take 1 tablet (10 mg total) by mouth daily. 90 tablet 3   furosemide  (LASIX ) 40 MG tablet Take 1 tablet (40 mg total) by mouth daily. 90 tablet 3   insulin  aspart protamine - aspart (NOVOLOG  MIX 70/30 FLEXPEN) (70-30) 100 UNIT/ML FlexPen Inject 0.1 mLs (10 Units total) into the skin 2 (two) times daily. 15 mL 11   isosorbide  mononitrate (IMDUR ) 30 MG 24 hr tablet Take 1 tablet (30 mg total) by mouth daily. (Patient not taking: Reported on 01/24/2024) 90 tablet 3   meloxicam (MOBIC) 15 MG tablet Take 15 mg by mouth daily. (Patient not taking: Reported on 01/24/2024)     metoprolol  succinate (TOPROL -XL) 50 MG 24 hr tablet Take 1 tablet (50 mg total) by mouth daily. Take with or immediately following a meal. 90 tablet 3   OZEMPIC, 0.25 OR 0.5 MG/DOSE, 2 MG/3ML SOPN Inject into the skin once a week.     sacubitril -valsartan  (ENTRESTO ) 24-26 MG Take 1 tablet by mouth 2 (two) times daily. 180 tablet 3   spironolactone  (ALDACTONE ) 25 MG tablet Take 0.5 tablets (12.5 mg total) by mouth daily. (Patient not taking: Reported on 01/24/2024) 45 tablet 3   No current facility-administered medications for this visit.     PHYSICAL EXAM:  General: Well appearing. No resp difficulty HEENT: normal Neck: supple, no JVD Cor: Regular rhythm, rate. No rubs,  gallops or murmurs Lungs: clear Abdomen: soft, nontender, nondistended. Extremities: no cyanosis, clubbing, rash, edema Neuro: alert & oriented X 3. Moves all 4 extremities w/o difficulty. Affect pleasant   ECG: not done   ASSESSMENT & PLAN:  1: NICM with mildly reduced ejection fraction- - suspect due to HTN/ DM as 12/2018 cath showed nonobstructive CAD - euvolemic today - weighing daily; reminded to call for an overnight weight gain of >2 pounds or a weekly weight gain of >5 pounds - weight 301 pounds since last visit here 3 months ago - Echo 01/10/2019: EF of 15-20% along with trivial AR and mild pulmonic stenosis. - Echo 06/03/20: EF 30% with mild LVH and mild PR - Echo 06/04/21: EF 30% with mild LVH and  mild valvular regurgitation  - Echo 05/21/23: EF 45-50% with trivial Gary - not adding salt and is trying to limit his sodium intake although does eat out - saw cardiology Beau Bound) 07/24; placed his office # on his AVS and emphasized that he needed to call him - continue farxiga  10mg  daily - continue furosemide  40mg  daily - continue metoprolol  succinate 50mg  daily - continue entresto  24/26mg  BID - plan to resume spironolactone  12.5mg  daily @ next visit - commercial copay cards provided for farxiga  and entresto  - BNP 07/10/2019 was 968.0  2: HTN- - BP  - saw PCP Marisa Sickles) 03/25 - BMP 01/24/24 reviewed: sodium 142, potassium 3.8, creatinine 1.09 and GFR 82 - BMET today  3: DM- - A1c 02/01/24 was 6.9%  4: Obesity- - using ozempic weekly - weight down 7 pounds by our scale     Charlette Console, FNP 04/18/24

## 2024-04-18 NOTE — Telephone Encounter (Signed)
 Called to confirm/remind patient of their appointment at the [facility] Clinic on 04/19/24.   Appointment:   [] Confirmed  [x] Left mess   [] No answer/No voice mail  [] VM Full/unable to leave message  [] Phone not in service  Patient reminded to bring all medications and/or complete list.  Confirmed patient has transportation. Gave directions, instructed to utilize valet parking.

## 2024-04-19 ENCOUNTER — Encounter: Admitting: Family

## 2024-04-26 ENCOUNTER — Emergency Department
Admission: EM | Admit: 2024-04-26 | Discharge: 2024-04-26 | Disposition: A | Discharge status: Home / Self Care | Attending: Emergency Medicine | Admitting: Emergency Medicine

## 2024-04-26 ENCOUNTER — Other Ambulatory Visit: Payer: Self-pay

## 2024-04-26 ENCOUNTER — Telehealth: Payer: Self-pay | Admitting: Family

## 2024-04-26 DIAGNOSIS — S3992XA Unspecified injury of lower back, initial encounter: Secondary | ICD-10-CM | POA: Diagnosis not present

## 2024-04-26 DIAGNOSIS — E119 Type 2 diabetes mellitus without complications: Secondary | ICD-10-CM | POA: Diagnosis not present

## 2024-04-26 DIAGNOSIS — Y92410 Unspecified street and highway as the place of occurrence of the external cause: Secondary | ICD-10-CM | POA: Insufficient documentation

## 2024-04-26 DIAGNOSIS — I509 Heart failure, unspecified: Secondary | ICD-10-CM | POA: Diagnosis not present

## 2024-04-26 DIAGNOSIS — I110 Hypertensive heart disease with heart failure: Secondary | ICD-10-CM | POA: Insufficient documentation

## 2024-04-26 DIAGNOSIS — S39012A Strain of muscle, fascia and tendon of lower back, initial encounter: Secondary | ICD-10-CM | POA: Insufficient documentation

## 2024-04-26 MED ORDER — METHOCARBAMOL 500 MG PO TABS
500.0000 mg | ORAL_TABLET | Freq: Three times a day (TID) | ORAL | 1 refills | Status: DC | PRN
Start: 2024-04-26 — End: 2024-04-29

## 2024-04-26 MED ORDER — KETOROLAC TROMETHAMINE 30 MG/ML IJ SOLN
30.0000 mg | Freq: Once | INTRAMUSCULAR | Status: AC
  Administered 2024-04-26: 30 mg via INTRAMUSCULAR
  Filled 2024-04-26: qty 1

## 2024-04-26 MED ORDER — NAPROXEN 500 MG PO TABS: 500.0000 mg | ORAL_TABLET | Freq: Two times a day (BID) | ORAL | 2 refills | Status: DC

## 2024-04-26 NOTE — Progress Notes (Deleted)
 Advanced Heart Failure Clinic Note    PCP: Martine Sleek, MD (last seen 03/25) Primary Cardiologist: Burney Carter, MD (last seen 07/24)  Chief Complaint:   HPI:  Gary Bentley is a 53 y/o male with a history of DM, HTN, anemia, osteomylitis, CKD, previous tobacco use and chronic heart failure.   Echo 01/10/2019: EF of 15-20% along with trivial AR and mild pulmonic stenosis.  R/ LHC 01/13/2019: Nonischemic cardiomyopathy, no significant CAD Mildly elevated wedge pressure,  EF severely depressed, global Hypokinesis, EF 20%  Echo 06/03/20: EF 30% with mild LVH and mild PR Echo 06/04/21: EF 30% with mild LVH and mild valvular regurgitation   Admitted 05/19/23 due to wound infection on rt great toe for several months but over the last month or so, he has noticed that the redness and swelling has began to expand into his ankle and up his calf w/ malodorous purulent discharge from his great toe despite antibiotics. Lower extremity Doppler >> negative for DVT. Echo 05/21/23: EF 45-50% with trivial Gary. Right foot x-ray>>evidence of osteomyelitis. Started on Zosyn  with podiatry consultation and  underwent amputation of the toe.   Seen in [facility] 03/25 and entresto  24/26mg  BID was resumed.   Was in the ED 04/01/24 with left ankle pain for a few days after rolling his foot at work. X-ray results revealed no acute bony abnormality. There is a small joint effusion and mild soft tissue swelling. Patient will be placed in cam boot. RICE therapy education provided.    He presents today with a chief complaint of a HF follow-up visit.   ROS: All systems negative except as listed in HPI, PMH and Problem List.  SH:  Social History   Socioeconomic History   Marital status: Single    Spouse name: Not on file   Number of children: Not on file   Years of education: Not on file   Highest education level: Not on file  Occupational History   Not on file  Tobacco Use   Smoking status: Former    Current  packs/day: 0.00    Types: Cigarettes    Quit date: 04/17/2017    Years since quitting: 7.0   Smokeless tobacco: Never  Vaping Use   Vaping status: Never Used  Substance and Sexual Activity   Alcohol use: No   Drug use: No   Sexual activity: Not Currently  Other Topics Concern   Not on file  Social History Narrative   No place to live at this time, independent otherwise   Social Drivers of Corporate investment banker Strain: Medium Risk (11/03/2023)   Received from [facility]   Overall Financial Resource Strain (CARDIA)    Difficulty of Paying Living Expenses: Somewhat hard  Food Insecurity: Food Insecurity Present (11/03/2023)   Received from [facility]   Hunger Vital Sign    Worried About Running Out of Food in the Last Year: Sometimes true    Ran Out of Food in the Last Year: Sometimes true  Transportation Needs: No Transportation Needs (11/03/2023)   Received from [facility] - Transportation    In the past 12 months, has lack of transportation kept you from medical appointments or from getting medications?: No    Lack of Transportation (Non-Medical): No  Physical Activity: Sufficiently Active (08/25/2019)   Exercise Vital Sign    Days of Exercise per Week: 5 days    Minutes of Exercise per Session: 150+ min  Stress: No Stress Concern Present (08/25/2019)   [employer] of Occupational Health - Occupational Stress Questionnaire    Feeling of Stress : Not at all  Social Connections: Unknown (09/03/2022)   Received from [facility]   Family and [employer] Support    Help with Day-to-Day Activities: Not on file    Lonely or Isolated: Not on file  Intimate Partner Violence: Not At Risk (05/20/2023)   Humiliation, Afraid, Rape, and Kick questionnaire    Fear of Current or Ex-Partner: No    Emotionally Abused: No    Physically Abused: No    Sexually Abused: No     FH:  Family History  Family history unknown: Yes    Past Medical History:  Diagnosis Date   CHF (congestive heart failure) (HCC)    Diabetes mellitus without complication (HCC)    Hypertension     Current Outpatient Medications  Medication Sig Dispense Refill   albuterol  (VENTOLIN  HFA) 108 (90 Base) MCG/ACT inhaler Inhale 1-2 puffs into the lungs every 6 (six) hours as needed for wheezing or shortness of breath. 6.7 g 0   Cysteamine Bitartrate (PROCYSBI) 300 MG PACK Use 1 each 3 (three) times daily Use as instructed. ONE TOUCH DELICA LANCETS E11.9 (Patient not taking: Reported on 01/24/2024)     dapagliflozin  propanediol (FARXIGA ) 10 MG TABS tablet Take 1 tablet (10 mg total) by mouth daily. 90 tablet 3   furosemide  (LASIX ) 40 MG tablet Take 1 tablet (40 mg total) by mouth daily. 90 tablet 3   insulin  aspart protamine - aspart (NOVOLOG  MIX 70/30 FLEXPEN) (70-30) 100 UNIT/ML FlexPen Inject 0.1 mLs (10 Units total) into the skin 2 (two) times daily. 15 mL 11   isosorbide  mononitrate (IMDUR ) 30 MG 24 hr tablet Take 1 tablet (30 mg total) by mouth daily. (Patient not taking: Reported on 01/24/2024) 90 tablet 3   meloxicam (MOBIC) 15 MG tablet Take 15 mg by mouth daily. (Patient not taking: Reported on 01/24/2024)     methocarbamol (ROBAXIN) 500 MG tablet Take 1 tablet (500 mg total) by mouth every 8 (eight) hours as needed for muscle spasms. 20 tablet 1   metoprolol  succinate (TOPROL -XL) 50 MG 24 hr tablet Take 1 tablet (50 mg total) by mouth daily. Take with or immediately following a meal. 90 tablet 3   naproxen (NAPROSYN) 500 MG tablet Take 1 tablet (500 mg total) by mouth 2 (two) times daily with a meal. 20 tablet 2   OZEMPIC, 0.25 OR 0.5 MG/DOSE, 2 MG/3ML SOPN Inject into the skin once a week.     sacubitril -valsartan  (ENTRESTO ) 24-26 MG Take 1 tablet by mouth 2 (two) times daily. 180 tablet 3   spironolactone  (ALDACTONE ) 25 MG tablet Take 0.5 tablets (12.5 mg total) by mouth daily.  (Patient not taking: Reported on 01/24/2024) 45 tablet 3   No current facility-administered medications for this visit.     PHYSICAL EXAM:  General: Well appearing. No resp difficulty HEENT: normal Neck: supple, no JVD Cor: Regular rhythm, rate. No rubs, gallops or murmurs Lungs: clear Abdomen: soft, nontender, nondistended. Extremities: no cyanosis, clubbing, rash, edema Neuro: alert & oriented X 3. Moves all 4 extremities w/o difficulty. Affect pleasant   ECG: not done   ASSESSMENT & PLAN:  1: NICM with mildly reduced ejection fraction- - suspect due to HTN/ DM as 12/2018 cath showed nonobstructive CAD - euvolemic today - weighing daily; reminded to call for an overnight weight gain of >2 pounds or  a weekly weight gain of >5 pounds - weight 301 pounds since last visit here 3 months ago - Echo 01/10/2019: EF of 15-20% along with trivial AR and mild pulmonic stenosis. - Echo 06/03/20: EF 30% with mild LVH and mild PR - Echo 06/04/21: EF 30% with mild LVH and mild valvular regurgitation  - Echo 05/21/23: EF 45-50% with trivial Gary - not adding salt and is trying to limit his sodium intake although does eat out - saw cardiology Beau Bound) 07/24; placed his office # on his AVS and emphasized that he needed to call him - continue farxiga  10mg  daily - continue furosemide  40mg  daily - continue metoprolol  succinate 50mg  daily - continue entresto  24/26mg  BID - plan to resume spironolactone  12.5mg  daily @ next visit - commercial copay cards provided for farxiga  and entresto  - BNP 07/10/2019 was 968.0  2: HTN- - BP  - saw PCP Marisa Sickles) 03/25 - BMP 01/24/24 reviewed: sodium 142, potassium 3.8, creatinine 1.09 and GFR 82 - BMET today  3: DM- - A1c 02/01/24 was 6.9%  4: Obesity- - using ozempic weekly - weight down 7 pounds by our scale     Charlette Console, FNP 04/26/24
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
## 2024-04-26 NOTE — Telephone Encounter (Signed)
 Called to confirm/remind patient of their appointment at the Advanced [facility] Failure Clinic on 04/27/24.   Appointment:   [x] Confirmed  [] Left mess   [] No answer/No voice mail  [] VM Full/unable to leave message  [] Phone not in service  Patient reminded to bring all medications and/or complete list.  Confirmed patient has transportation. Gave directions, instructed to utilize valet parking.
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
## 2024-04-27 ENCOUNTER — Telehealth: Payer: Self-pay | Admitting: Family

## 2024-04-27 ENCOUNTER — Encounter: Admitting: Family

## 2024-04-27 NOTE — Telephone Encounter (Signed)
 Patient did not show for his Heart Failure Clinic appointment on 04/27/24. This is the 11th appt he has missed.

## 2024-04-29 ENCOUNTER — Emergency Department

## 2024-04-29 ENCOUNTER — Emergency Department
Admission: EM | Admit: 2024-04-29 | Discharge: 2024-04-29 | Disposition: A | Discharge status: Home / Self Care | Attending: Emergency Medicine | Admitting: Emergency Medicine

## 2024-04-29 ENCOUNTER — Other Ambulatory Visit: Payer: Self-pay

## 2024-04-29 ENCOUNTER — Encounter: Payer: Self-pay | Admitting: Emergency Medicine

## 2024-04-29 DIAGNOSIS — S39012S Strain of muscle, fascia and tendon of lower back, sequela: Secondary | ICD-10-CM | POA: Insufficient documentation

## 2024-04-29 DIAGNOSIS — E119 Type 2 diabetes mellitus without complications: Secondary | ICD-10-CM | POA: Insufficient documentation

## 2024-04-29 DIAGNOSIS — I110 Hypertensive heart disease with heart failure: Secondary | ICD-10-CM | POA: Insufficient documentation

## 2024-04-29 DIAGNOSIS — S39012D Strain of muscle, fascia and tendon of lower back, subsequent encounter: Secondary | ICD-10-CM | POA: Insufficient documentation

## 2024-04-29 DIAGNOSIS — S39012A Strain of muscle, fascia and tendon of lower back, initial encounter: Secondary | ICD-10-CM | POA: Diagnosis not present

## 2024-04-29 DIAGNOSIS — S3992XS Unspecified injury of lower back, sequela: Secondary | ICD-10-CM | POA: Diagnosis not present

## 2024-04-29 DIAGNOSIS — I509 Heart failure, unspecified: Secondary | ICD-10-CM | POA: Diagnosis not present

## 2024-04-29 DIAGNOSIS — M549 Dorsalgia, unspecified: Secondary | ICD-10-CM | POA: Diagnosis not present

## 2024-04-29 DIAGNOSIS — R0781 Pleurodynia: Secondary | ICD-10-CM | POA: Insufficient documentation

## 2024-04-29 DIAGNOSIS — M47816 Spondylosis without myelopathy or radiculopathy, lumbar region: Secondary | ICD-10-CM | POA: Diagnosis not present

## 2024-04-29 MED ORDER — CYCLOBENZAPRINE HCL 5 MG PO TABS: 5.0000 mg | ORAL_TABLET | Freq: Three times a day (TID) | ORAL | 0 refills | Status: DC | PRN

## 2024-04-29 MED ORDER — KETOROLAC TROMETHAMINE 30 MG/ML IJ SOLN
30.0000 mg | Freq: Once | INTRAMUSCULAR | Status: AC
  Administered 2024-04-29: 30 mg via INTRAMUSCULAR
  Filled 2024-04-29: qty 1

## 2024-04-29 MED ORDER — LIDOCAINE 5 % EX PTCH
1.0000 | MEDICATED_PATCH | CUTANEOUS | Status: DC
  Administered 2024-04-29: 1 via TRANSDERMAL
  Filled 2024-04-29: qty 1

## 2024-04-29 MED ORDER — ACETAMINOPHEN 325 MG PO TABS
650.0000 mg | ORAL_TABLET | Freq: Once | ORAL | Status: AC
  Administered 2024-04-29: 650 mg via ORAL
  Filled 2024-04-29: qty 2

## 2024-04-29 MED ORDER — HYDROXYZINE HCL 10 MG PO TABS: 10.0000 mg | ORAL_TABLET | Freq: Three times a day (TID) | ORAL | 0 refills | Status: DC | PRN
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
## 2024-04-29 NOTE — Discharge Instructions (Signed)
 Discontinue methocarbamol .  You have been prescribed a new muscle relaxer Flexeril.  Alternate taking Tylenol  and ibuprofen for pain as needed.  Your x-rays during this visit of your lumbar spine and right rib cage are normal.  Please follow-up with your primary care physician.

## 2024-05-18 ENCOUNTER — Telehealth: Payer: Self-pay | Admitting: Family

## 2024-05-18 NOTE — Telephone Encounter (Signed)
 Called to confirm/remind patient of their appointment at the [facility] on 05/19/24.   Appointment:   [] Confirmed  [x] Left mess   [] No answer/No voice mail  [] VM Full/unable to leave message  [] Phone not in service  Patient reminded to bring all medications and/or complete list.  Confirmed patient has transportation. Gave directions, instructed to utilize valet parking.
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
## 2024-05-19 ENCOUNTER — Encounter: Payer: Self-pay | Admitting: Family

## 2024-05-19 ENCOUNTER — Ambulatory Visit (HOSPITAL_BASED_OUTPATIENT_CLINIC_OR_DEPARTMENT_OTHER): Admitting: Family

## 2024-05-19 ENCOUNTER — Other Ambulatory Visit
Admission: RE | Admit: 2024-05-19 | Discharge: 2024-05-19 | Disposition: A | Discharge status: Home / Self Care | Attending: Family | Admitting: Family

## 2024-05-19 VITALS — BP 117/82 | HR 92 | Wt 307.0 lb

## 2024-05-19 DIAGNOSIS — Z6836 Body mass index (BMI) 36.0-36.9, adult: Secondary | ICD-10-CM | POA: Diagnosis not present

## 2024-05-19 DIAGNOSIS — E669 Obesity, unspecified: Secondary | ICD-10-CM | POA: Diagnosis not present

## 2024-05-19 DIAGNOSIS — I130 Hypertensive heart and chronic kidney disease with heart failure and stage 1 through stage 4 chronic kidney disease, or unspecified chronic kidney disease: Secondary | ICD-10-CM | POA: Insufficient documentation

## 2024-05-19 DIAGNOSIS — Z87891 Personal history of nicotine dependence: Secondary | ICD-10-CM | POA: Insufficient documentation

## 2024-05-19 DIAGNOSIS — M545 Low back pain, unspecified: Secondary | ICD-10-CM | POA: Diagnosis not present

## 2024-05-19 DIAGNOSIS — I5022 Chronic systolic (congestive) heart failure: Secondary | ICD-10-CM

## 2024-05-19 DIAGNOSIS — I10 Essential (primary) hypertension: Secondary | ICD-10-CM

## 2024-05-19 DIAGNOSIS — Z794 Long term (current) use of insulin: Secondary | ICD-10-CM

## 2024-05-19 DIAGNOSIS — E1122 Type 2 diabetes mellitus with diabetic chronic kidney disease: Secondary | ICD-10-CM | POA: Insufficient documentation

## 2024-05-19 DIAGNOSIS — Z5941 Food insecurity: Secondary | ICD-10-CM | POA: Diagnosis not present

## 2024-05-19 DIAGNOSIS — I2510 Atherosclerotic heart disease of native coronary artery without angina pectoris: Secondary | ICD-10-CM | POA: Diagnosis not present

## 2024-05-19 DIAGNOSIS — Z7985 Long-term (current) use of injectable non-insulin antidiabetic drugs: Secondary | ICD-10-CM | POA: Insufficient documentation

## 2024-05-19 DIAGNOSIS — E119 Type 2 diabetes mellitus without complications: Secondary | ICD-10-CM | POA: Diagnosis not present

## 2024-05-19 DIAGNOSIS — I428 Other cardiomyopathies: Secondary | ICD-10-CM | POA: Diagnosis not present

## 2024-05-19 DIAGNOSIS — Z79899 Other long term (current) drug therapy: Secondary | ICD-10-CM | POA: Diagnosis not present

## 2024-05-19 DIAGNOSIS — Z5986 Financial insecurity: Secondary | ICD-10-CM | POA: Diagnosis not present

## 2024-05-19 DIAGNOSIS — Z7984 Long term (current) use of oral hypoglycemic drugs: Secondary | ICD-10-CM | POA: Insufficient documentation

## 2024-05-19 LAB — BASIC METABOLIC PANEL WITH GFR
Anion gap: 7 (ref 5–15)
BUN: 16 mg/dL (ref 6–20)
CO2: 25 mmol/L (ref 22–32)
Calcium: 8.8 mg/dL — ABNORMAL LOW (ref 8.9–10.3)
Chloride: 106 mmol/L (ref 98–111)
Creatinine, Ser: 1.27 mg/dL — ABNORMAL HIGH (ref 0.61–1.24)
GFR, Estimated: >60 mL/min (ref >60)
Glucose, Bld: 173 mg/dL — ABNORMAL HIGH (ref 70–99)
Potassium: 3.6 mmol/L (ref 3.5–5.1)
Sodium: 138 mmol/L (ref 135–145)

## 2024-05-19 LAB — LIPID PANEL
Cholesterol: 195 mg/dL (ref 0–200)
HDL: 41 mg/dL (ref >40)
LDL Cholesterol: 122 mg/dL — ABNORMAL HIGH (ref 0–99)
Total CHOL/HDL Ratio: 4.8 ratio
Triglycerides: 159 mg/dL — ABNORMAL HIGH (ref <150)
VLDL: 32 mg/dL (ref 0–40)

## 2024-05-19 MED ORDER — METOPROLOL SUCCINATE ER 25 MG PO TB24: 25.0000 mg | ORAL_TABLET | Freq: Every day | ORAL | 3 refills | Status: DC

## 2024-05-19 NOTE — Patient Instructions (Signed)
 Medication Changes:  START Metoprolol  25 mg daily.  Lab Work:  Go over to the [facility]. Go pass the gift shop and have your blood work completed.  We will only call you if the results are abnormal or if the provider would like to make medication changes.   Follow-Up in: 2 months  At the [facility], you and your health needs are our priority. We have a designated team specialized in the treatment of Heart Failure. This Care Team includes your primary Heart Failure Specialized Cardiologist (physician), Advanced Practice Providers (APPs- Physician Assistants and Nurse Practitioners), and Pharmacist who all work together to provide you with the care you need, when you need it.   You may see any of the following providers on your designated Care Team at your next follow up:  Dr. Toribio Fuel Dr. Ezra Shuck Dr. Ria Commander Dr. Odis Brownie Ellouise Class, FNP Jaun Bash, RPH-CPP  Please be sure to bring in all your medications bottles to every appointment.   Need to Contact Us :  If you have any questions or concerns before your next appointment please send us  a message through [place] or call our office at [phone].    TO LEAVE A MESSAGE FOR THE NURSE SELECT OPTION 2, PLEASE LEAVE A MESSAGE INCLUDING: YOUR NAME DATE OF BIRTH CALL BACK NUMBER REASON FOR CALL**this is important as we prioritize the call backs  YOU WILL RECEIVE A CALL BACK THE SAME DAY AS LONG AS YOU CALL BEFORE 4:00 PM

## 2024-05-22 ENCOUNTER — Ambulatory Visit: Payer: Self-pay | Admitting: Family

## 2024-05-25 NOTE — Telephone Encounter (Signed)
 Called to review lab work and recommendations. lvm

## 2024-05-25 NOTE — Telephone Encounter (Signed)
 Pt returned call to receive lab work information. Pt verbalized understanding of recommendations. No further questions at this time.

## 2024-05-25 NOTE — Telephone Encounter (Signed)
-----   Message from Ellouise DELENA Class sent at 05/25/2024  7:39 AM EDT ----- Please call him with unread mychart message.  ----- Message ----- From: Rebecka, Lab In [place] Sent: 05/19/2024   3:48 PM EDT To: Ellouise DELENA Class, FNP

## 2024-07-07 ENCOUNTER — Telehealth: Payer: Self-pay | Admitting: Family

## 2024-07-07 NOTE — Telephone Encounter (Signed)
 Called to confirm/remind patient of their appointment at the [facility] on 07/10/24.   Appointment:   [x] Confirmed  [] Left mess   [] No answer/No voice mail  [] VM Full/unable to leave message  [] Phone not in service  Patient reminded to bring all medications and/or complete list.  Confirmed patient has transportation. Gave directions, instructed to utilize valet parking.
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
## 2024-07-10 ENCOUNTER — Encounter: Admitting: Family

## 2024-07-10 ENCOUNTER — Telehealth: Payer: Self-pay | Admitting: Family

## 2024-07-10 NOTE — Telephone Encounter (Signed)
 Patient did not show for his Heart Failure Clinic appointment on 07/10/24.

## 2024-08-04 DIAGNOSIS — L910 Hypertrophic scar: Secondary | ICD-10-CM | POA: Diagnosis not present
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
## 2024-08-23 ENCOUNTER — Ambulatory Visit: Attending: Family | Admitting: Family

## 2024-08-23 ENCOUNTER — Encounter: Payer: Self-pay | Admitting: Family

## 2024-08-23 VITALS — BP 139/89 | HR 79 | Wt 302.0 lb

## 2024-08-23 DIAGNOSIS — Z79899 Other long term (current) drug therapy: Secondary | ICD-10-CM | POA: Diagnosis not present

## 2024-08-23 DIAGNOSIS — E785 Hyperlipidemia, unspecified: Secondary | ICD-10-CM | POA: Insufficient documentation

## 2024-08-23 DIAGNOSIS — E1122 Type 2 diabetes mellitus with diabetic chronic kidney disease: Secondary | ICD-10-CM | POA: Diagnosis not present

## 2024-08-23 DIAGNOSIS — Z7984 Long term (current) use of oral hypoglycemic drugs: Secondary | ICD-10-CM | POA: Diagnosis not present

## 2024-08-23 DIAGNOSIS — E119 Type 2 diabetes mellitus without complications: Secondary | ICD-10-CM

## 2024-08-23 DIAGNOSIS — I130 Hypertensive heart and chronic kidney disease with heart failure and stage 1 through stage 4 chronic kidney disease, or unspecified chronic kidney disease: Secondary | ICD-10-CM | POA: Diagnosis not present

## 2024-08-23 DIAGNOSIS — Z794 Long term (current) use of insulin: Secondary | ICD-10-CM

## 2024-08-23 DIAGNOSIS — Z6835 Body mass index (BMI) 35.0-35.9, adult: Secondary | ICD-10-CM | POA: Insufficient documentation

## 2024-08-23 DIAGNOSIS — I5022 Chronic systolic (congestive) heart failure: Secondary | ICD-10-CM | POA: Diagnosis not present

## 2024-08-23 DIAGNOSIS — Z7985 Long-term (current) use of injectable non-insulin antidiabetic drugs: Secondary | ICD-10-CM | POA: Insufficient documentation

## 2024-08-23 DIAGNOSIS — N189 Chronic kidney disease, unspecified: Secondary | ICD-10-CM | POA: Insufficient documentation

## 2024-08-23 DIAGNOSIS — I428 Other cardiomyopathies: Secondary | ICD-10-CM | POA: Diagnosis not present

## 2024-08-23 DIAGNOSIS — I10 Essential (primary) hypertension: Secondary | ICD-10-CM

## 2024-08-23 DIAGNOSIS — I2510 Atherosclerotic heart disease of native coronary artery without angina pectoris: Secondary | ICD-10-CM | POA: Insufficient documentation

## 2024-08-23 DIAGNOSIS — E669 Obesity, unspecified: Secondary | ICD-10-CM

## 2024-08-23 DIAGNOSIS — I110 Hypertensive heart disease with heart failure: Secondary | ICD-10-CM | POA: Diagnosis not present

## 2024-08-23 DIAGNOSIS — E782 Mixed hyperlipidemia: Secondary | ICD-10-CM

## 2024-08-23 LAB — BASIC METABOLIC PANEL WITH GFR
BUN/Creatinine Ratio: 11 (ref 9–20)
BUN: 13 mg/dL (ref 6–24)
CO2: 22 mmol/L (ref 20–29)
Calcium: 9.1 mg/dL (ref 8.7–10.2)
Chloride: 103 mmol/L (ref 96–106)
Creatinine, Ser: 1.15 mg/dL (ref 0.76–1.27)
Glucose: 208 mg/dL — ABNORMAL HIGH (ref 70–99)
Potassium: 3.8 mmol/L (ref 3.5–5.2)
Sodium: 139 mmol/L (ref 134–144)
eGFR: 77 mL/min/1.73 (ref >59)

## 2024-08-23 MED ORDER — METOPROLOL SUCCINATE ER 25 MG PO TB24: 25.0000 mg | ORAL_TABLET | Freq: Every day | ORAL | 3 refills | Status: AC

## 2024-08-23 MED ORDER — FUROSEMIDE 40 MG PO TABS: 40.0000 mg | ORAL_TABLET | Freq: Every day | ORAL | 3 refills | Status: AC

## 2024-08-23 MED ORDER — DAPAGLIFLOZIN PROPANEDIOL 10 MG PO TABS: 10.0000 mg | ORAL_TABLET | Freq: Every day | ORAL | 3 refills | Status: AC

## 2024-08-23 MED ORDER — SACUBITRIL-VALSARTAN 24-26 MG PO TABS: 1.0000 | ORAL_TABLET | Freq: Two times a day (BID) | ORAL | 3 refills | Status: AC

## 2024-08-23 NOTE — Patient Instructions (Signed)
 Medication Changes:  No medication changes.   Lab Work:  Go downstairs to [place] on LOWER LEV[address] to have your blood work completed.  We will only call you if the results are abnormal or if the provider would like to make medication changes.  No news is good news.   Follow-Up in: 3 months with Ellouise Class, FNP.   Thank you for choosing [facility].    At the Advanced Heart Failure Clinic, you and your health needs are our priority. We have a designated team specialized in the treatment of Heart Failure. This Care Team includes your primary Heart Failure Specialized Cardiologist (physician), Advanced Practice Providers (APPs- Physician Assistants and Nurse Practitioners), and Pharmacist who all work together to provide you with the care you need, when you need it.   You may see any of the following providers on your designated Care Team at your next follow up:  Dr. Toribio Fuel Dr. Ezra Shuck Dr. Ria Commander Dr. Morene Brownie Ellouise Class, FNP Jaun Bash, RPH-CPP  Please be sure to bring in all your medications bottles to every appointment.   Need to Contact Us :  If you have any questions or concerns before your next appointment please send us  a message through [place] or call our office at [phone].    TO LEAVE A MESSAGE FOR THE NURSE SELECT OPTION 2, PLEASE LEAVE A MESSAGE INCLUDING: YOUR NAME DATE OF BIRTH CALL BACK NUMBER REASON FOR CALL**this is important as we prioritize the call backs  YOU WILL RECEIVE A CALL BACK THE SAME DAY AS LONG AS YOU CALL BEFORE 4:00 PM

## 2024-08-24 ENCOUNTER — Ambulatory Visit: Payer: Self-pay | Admitting: Family

## 2024-11-09 DIAGNOSIS — B351 Tinea unguium: Secondary | ICD-10-CM | POA: Diagnosis not present

## 2024-11-09 DIAGNOSIS — Z89412 Acquired absence of left great toe: Secondary | ICD-10-CM | POA: Diagnosis not present

## 2024-11-09 DIAGNOSIS — E1142 Type 2 diabetes mellitus with diabetic polyneuropathy: Secondary | ICD-10-CM | POA: Diagnosis not present

## 2024-11-28 ENCOUNTER — Telehealth: Payer: Self-pay | Admitting: Family

## 2024-11-28 NOTE — Telephone Encounter (Signed)
 Called to confirm/remind patient of their appointment at the Advanced Heart Failure Clinic on 11/29/24.   Appointment:   [x] Confirmed  [] Left mess   [] No answer/No voice mail  [] VM Full/unable to leave message  [] Phone not in service  Patient reminded to bring all medications and/or complete list.  Confirmed patient has transportation. Gave directions, instructed to utilize valet parking.
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
## 2024-11-29 ENCOUNTER — Ambulatory Visit: Admitting: Family

## 2024-11-29 ENCOUNTER — Encounter: Payer: Self-pay | Admitting: Family

## 2024-11-29 VITALS — BP 127/91 | HR 80 | Wt 303.4 lb

## 2024-11-29 DIAGNOSIS — D631 Anemia in chronic kidney disease: Secondary | ICD-10-CM | POA: Diagnosis not present

## 2024-11-29 DIAGNOSIS — I2510 Atherosclerotic heart disease of native coronary artery without angina pectoris: Secondary | ICD-10-CM | POA: Diagnosis not present

## 2024-11-29 DIAGNOSIS — E782 Mixed hyperlipidemia: Secondary | ICD-10-CM

## 2024-11-29 DIAGNOSIS — N189 Chronic kidney disease, unspecified: Secondary | ICD-10-CM | POA: Insufficient documentation

## 2024-11-29 DIAGNOSIS — E119 Type 2 diabetes mellitus without complications: Secondary | ICD-10-CM

## 2024-11-29 DIAGNOSIS — E785 Hyperlipidemia, unspecified: Secondary | ICD-10-CM | POA: Diagnosis not present

## 2024-11-29 DIAGNOSIS — Z6835 Body mass index (BMI) 35.0-35.9, adult: Secondary | ICD-10-CM | POA: Diagnosis not present

## 2024-11-29 DIAGNOSIS — E1122 Type 2 diabetes mellitus with diabetic chronic kidney disease: Secondary | ICD-10-CM | POA: Diagnosis not present

## 2024-11-29 DIAGNOSIS — Z7984 Long term (current) use of oral hypoglycemic drugs: Secondary | ICD-10-CM | POA: Diagnosis not present

## 2024-11-29 DIAGNOSIS — Z794 Long term (current) use of insulin: Secondary | ICD-10-CM

## 2024-11-29 DIAGNOSIS — Z7985 Long-term (current) use of injectable non-insulin antidiabetic drugs: Secondary | ICD-10-CM | POA: Diagnosis not present

## 2024-11-29 DIAGNOSIS — Z87891 Personal history of nicotine dependence: Secondary | ICD-10-CM | POA: Diagnosis not present

## 2024-11-29 DIAGNOSIS — I130 Hypertensive heart and chronic kidney disease with heart failure and stage 1 through stage 4 chronic kidney disease, or unspecified chronic kidney disease: Secondary | ICD-10-CM | POA: Insufficient documentation

## 2024-11-29 DIAGNOSIS — I5022 Chronic systolic (congestive) heart failure: Secondary | ICD-10-CM | POA: Insufficient documentation

## 2024-11-29 DIAGNOSIS — Z79899 Other long term (current) drug therapy: Secondary | ICD-10-CM | POA: Insufficient documentation

## 2024-11-29 DIAGNOSIS — E1169 Type 2 diabetes mellitus with other specified complication: Secondary | ICD-10-CM | POA: Diagnosis not present

## 2024-11-29 DIAGNOSIS — I10 Essential (primary) hypertension: Secondary | ICD-10-CM | POA: Diagnosis not present

## 2024-11-29 DIAGNOSIS — M869 Osteomyelitis, unspecified: Secondary | ICD-10-CM | POA: Diagnosis not present

## 2024-11-29 DIAGNOSIS — E669 Obesity, unspecified: Secondary | ICD-10-CM | POA: Diagnosis not present

## 2024-11-29 DIAGNOSIS — I428 Other cardiomyopathies: Secondary | ICD-10-CM | POA: Insufficient documentation

## 2024-11-29 LAB — BASIC METABOLIC PANEL WITH GFR
BUN/Creatinine Ratio: 14 (ref 9–20)
BUN: 16 mg/dL (ref 6–24)
CO2: 23 mmol/L (ref 20–29)
Calcium: 10.2 mg/dL (ref 8.7–10.2)
Chloride: 102 mmol/L (ref 96–106)
Creatinine, Ser: 1.18 mg/dL (ref 0.76–1.27)
Glucose: 179 mg/dL — ABNORMAL HIGH (ref 70–99)
Potassium: 4.1 mmol/L (ref 3.5–5.2)
Sodium: 139 mmol/L (ref 134–144)
eGFR: 74 mL/min/1.73

## 2024-11-29 LAB — LIPID PANEL
Chol/HDL Ratio: 4.7 ratio (ref 0.0–5.0)
Cholesterol, Total: 231 mg/dL — ABNORMAL HIGH (ref 100–199)
HDL: 49 mg/dL
LDL Chol Calc (NIH): 160 mg/dL — ABNORMAL HIGH (ref 0–99)
Triglycerides: 125 mg/dL (ref 0–149)
VLDL Cholesterol Cal: 22 mg/dL (ref 5–40)

## 2024-11-29 NOTE — Patient Instructions (Addendum)
 Call Dr. Bernita office at [phone] to schedule a follow-up appointment.    Call your pharmacy and request refills of your medications.

## 2024-11-30 ENCOUNTER — Ambulatory Visit: Payer: Self-pay | Admitting: Family

## 2024-11-30 MED ORDER — ROSUVASTATIN CALCIUM 10 MG PO TABS: 10.0000 mg | ORAL_TABLET | Freq: Every day | ORAL | 3 refills | Status: AC

## 2024-11-30 NOTE — Telephone Encounter (Addendum)
" °  Pt aware and agreeable. Crestor  sent to pt's requested pharmacy, and pt instructed to call if he has any questions or concerns arise.   ----- Message from Ellouise DELENA Class sent at 11/30/2024  8:13 AM EST ----- Kidneys and potassium level are normal. LDL (bad cholesterol) and cholesterol are high. Begin crestor  10mg  daily. Decrease consumption of fried / fatty foods.  "

## 2024-11-30 NOTE — Telephone Encounter (Deleted)
-----   Message from Ellouise DELENA Class sent at 11/30/2024  8:13 AM EST ----- Kidneys and potassium level are normal. LDL (bad cholesterol) and cholesterol are high. Begin crestor  10mg  daily. Decrease consumption of fried / fatty foods.

## 2025-01-29 ENCOUNTER — Ambulatory Visit: Admitting: Family
